# Patient Record
Sex: Female | Born: 1970
Health system: Southern US, Community
[De-identification: ages and names within clinical notes are randomized; demographics above are authoritative.]

## PROBLEM LIST (undated history)

## (undated) DIAGNOSIS — F0781 Postconcussional syndrome: Secondary | ICD-10-CM

## (undated) DIAGNOSIS — C181 Malignant neoplasm of appendix: Secondary | ICD-10-CM

## (undated) DIAGNOSIS — D509 Iron deficiency anemia, unspecified: Secondary | ICD-10-CM

## (undated) DIAGNOSIS — E559 Vitamin D deficiency, unspecified: Secondary | ICD-10-CM

## (undated) DIAGNOSIS — R05 Cough: Secondary | ICD-10-CM

## (undated) DIAGNOSIS — R51 Headache: Secondary | ICD-10-CM

## (undated) DIAGNOSIS — N200 Calculus of kidney: Secondary | ICD-10-CM

## (undated) DIAGNOSIS — R011 Cardiac murmur, unspecified: Secondary | ICD-10-CM

## (undated) DIAGNOSIS — Z9889 Other specified postprocedural states: Secondary | ICD-10-CM

## (undated) DIAGNOSIS — N809 Endometriosis, unspecified: Secondary | ICD-10-CM

## (undated) DIAGNOSIS — K219 Gastro-esophageal reflux disease without esophagitis: Secondary | ICD-10-CM

## (undated) DIAGNOSIS — G43909 Migraine, unspecified, not intractable, without status migrainosus: Secondary | ICD-10-CM

## (undated) DIAGNOSIS — R519 Headache, unspecified: Secondary | ICD-10-CM

## (undated) DIAGNOSIS — F329 Major depressive disorder, single episode, unspecified: Secondary | ICD-10-CM

## (undated) DIAGNOSIS — T4145XA Adverse effect of unspecified anesthetic, initial encounter: Secondary | ICD-10-CM

## (undated) DIAGNOSIS — R059 Cough, unspecified: Secondary | ICD-10-CM

## (undated) DIAGNOSIS — F419 Anxiety disorder, unspecified: Secondary | ICD-10-CM

## (undated) DIAGNOSIS — D649 Anemia, unspecified: Secondary | ICD-10-CM

## (undated) DIAGNOSIS — F32A Depression, unspecified: Secondary | ICD-10-CM

## (undated) DIAGNOSIS — E039 Hypothyroidism, unspecified: Secondary | ICD-10-CM

## (undated) DIAGNOSIS — T8859XA Other complications of anesthesia, initial encounter: Secondary | ICD-10-CM

## (undated) DIAGNOSIS — E66813 Obesity, class 3: Secondary | ICD-10-CM

## (undated) HISTORY — DX: Migraine, unspecified, not intractable, without status migrainosus: G43.909

## (undated) HISTORY — DX: Other specified postprocedural states: Z98.890

## (undated) HISTORY — DX: Cough: R05

## (undated) HISTORY — PX: KNEE SURGERY: SHX244

## (undated) HISTORY — DX: Anemia, unspecified: D64.9

## (undated) HISTORY — DX: Postconcussional syndrome: F07.81

## (undated) HISTORY — DX: Cardiac murmur, unspecified: R01.1

## (undated) HISTORY — DX: Vitamin D deficiency, unspecified: E55.9

## (undated) HISTORY — DX: Obesity, class 3: E66.813

## (undated) HISTORY — DX: Malignant neoplasm of appendix: C18.1

## (undated) HISTORY — PX: ABDOMINAL HYSTERECTOMY: SHX81

## (undated) HISTORY — DX: Cough, unspecified: R05.9

## (undated) HISTORY — DX: Iron deficiency anemia, unspecified: D50.9

## (undated) HISTORY — DX: Morbid (severe) obesity due to excess calories: E66.01

## (undated) HISTORY — DX: Endometriosis, unspecified: N80.9

---

## 1997-05-16 ENCOUNTER — Encounter (HOSPITAL_COMMUNITY): Admission: RE | Admit: 1997-05-16 | Discharge: 1997-06-27 | Payer: Self-pay | Admitting: *Deleted

## 1999-03-29 ENCOUNTER — Encounter: Admission: RE | Admit: 1999-03-29 | Discharge: 1999-03-29 | Payer: Self-pay | Admitting: Otolaryngology

## 1999-03-29 ENCOUNTER — Encounter: Payer: Self-pay | Admitting: Otolaryngology

## 1999-04-24 ENCOUNTER — Other Ambulatory Visit: Admission: RE | Admit: 1999-04-24 | Discharge: 1999-04-24 | Payer: Self-pay | Admitting: Obstetrics and Gynecology

## 2000-04-29 ENCOUNTER — Other Ambulatory Visit: Admission: RE | Admit: 2000-04-29 | Discharge: 2000-04-29 | Payer: Self-pay | Admitting: Obstetrics and Gynecology

## 2003-01-12 ENCOUNTER — Other Ambulatory Visit: Admission: RE | Admit: 2003-01-12 | Discharge: 2003-01-12 | Payer: Self-pay | Admitting: Obstetrics and Gynecology

## 2005-04-15 HISTORY — PX: COLPOSCOPY: SHX161

## 2006-12-23 ENCOUNTER — Inpatient Hospital Stay (HOSPITAL_COMMUNITY): Admission: AD | Admit: 2006-12-23 | Discharge: 2006-12-23 | Payer: Self-pay | Admitting: Obstetrics and Gynecology

## 2007-01-28 ENCOUNTER — Observation Stay (HOSPITAL_COMMUNITY): Admission: AD | Admit: 2007-01-28 | Discharge: 2007-01-29 | Payer: Self-pay | Admitting: Obstetrics and Gynecology

## 2007-02-15 ENCOUNTER — Inpatient Hospital Stay (HOSPITAL_COMMUNITY): Admission: AD | Admit: 2007-02-15 | Discharge: 2007-02-16 | Payer: Self-pay | Admitting: Obstetrics and Gynecology

## 2007-03-20 ENCOUNTER — Inpatient Hospital Stay (HOSPITAL_COMMUNITY): Admission: AD | Admit: 2007-03-20 | Discharge: 2007-03-21 | Payer: Self-pay | Admitting: Obstetrics and Gynecology

## 2007-03-31 ENCOUNTER — Inpatient Hospital Stay (HOSPITAL_COMMUNITY): Admission: AD | Admit: 2007-03-31 | Discharge: 2007-03-31 | Payer: Self-pay | Admitting: Obstetrics and Gynecology

## 2007-04-20 ENCOUNTER — Inpatient Hospital Stay (HOSPITAL_COMMUNITY): Admission: AD | Admit: 2007-04-20 | Discharge: 2007-04-20 | Payer: Self-pay | Admitting: Obstetrics and Gynecology

## 2007-04-30 ENCOUNTER — Inpatient Hospital Stay (HOSPITAL_COMMUNITY): Admission: AD | Admit: 2007-04-30 | Discharge: 2007-05-03 | Payer: Self-pay | Admitting: Obstetrics and Gynecology

## 2007-05-06 ENCOUNTER — Inpatient Hospital Stay (HOSPITAL_COMMUNITY): Admission: AD | Admit: 2007-05-06 | Discharge: 2007-05-06 | Payer: Self-pay | Admitting: Obstetrics and Gynecology

## 2008-04-15 DIAGNOSIS — Z9889 Other specified postprocedural states: Secondary | ICD-10-CM

## 2008-04-15 HISTORY — DX: Other specified postprocedural states: Z98.890

## 2008-04-15 HISTORY — PX: NASAL SINUS SURGERY: SHX719

## 2008-05-07 ENCOUNTER — Ambulatory Visit: Payer: Self-pay | Admitting: Family Medicine

## 2008-05-07 DIAGNOSIS — S83419A Sprain of medial collateral ligament of unspecified knee, initial encounter: Secondary | ICD-10-CM | POA: Insufficient documentation

## 2008-05-07 DIAGNOSIS — S93409A Sprain of unspecified ligament of unspecified ankle, initial encounter: Secondary | ICD-10-CM | POA: Insufficient documentation

## 2010-04-25 DIAGNOSIS — N809 Endometriosis, unspecified: Secondary | ICD-10-CM | POA: Insufficient documentation

## 2010-05-11 DIAGNOSIS — F418 Other specified anxiety disorders: Secondary | ICD-10-CM | POA: Insufficient documentation

## 2010-06-04 DIAGNOSIS — M797 Fibromyalgia: Secondary | ICD-10-CM | POA: Insufficient documentation

## 2010-12-15 ENCOUNTER — Encounter: Payer: Self-pay | Admitting: Family Medicine

## 2010-12-15 ENCOUNTER — Inpatient Hospital Stay (INDEPENDENT_AMBULATORY_CARE_PROVIDER_SITE_OTHER)
Admission: RE | Admit: 2010-12-15 | Discharge: 2010-12-15 | Disposition: A | Discharge status: Home / Self Care | Payer: 59 | Source: Ambulatory Visit | Attending: Family Medicine | Admitting: Family Medicine

## 2010-12-15 DIAGNOSIS — J069 Acute upper respiratory infection, unspecified: Secondary | ICD-10-CM

## 2010-12-15 DIAGNOSIS — H6980 Other specified disorders of Eustachian tube, unspecified ear: Secondary | ICD-10-CM

## 2010-12-15 LAB — CONVERTED CEMR LAB: Rapid Strep: NEGATIVE

## 2011-01-03 LAB — URINALYSIS, ROUTINE W REFLEX MICROSCOPIC
Bilirubin Urine: NEGATIVE
Glucose, UA: NEGATIVE
Ketones, ur: NEGATIVE
Leukocytes, UA: NEGATIVE
Nitrite: NEGATIVE
Protein, ur: NEGATIVE
Specific Gravity, Urine: 1.01
Urobilinogen, UA: 0.2
pH: 7

## 2011-01-03 LAB — CBC
HCT: 26.1 — ABNORMAL LOW
HCT: 27.4 — ABNORMAL LOW
Hemoglobin: 9.1 — ABNORMAL LOW
MCV: 87.6
Platelets: 283
RBC: 2.98 — ABNORMAL LOW
RBC: 3.12 — ABNORMAL LOW
WBC: 8.1

## 2011-01-03 LAB — URINE MICROSCOPIC-ADD ON

## 2011-01-03 LAB — RPR: RPR Ser Ql: NONREACTIVE

## 2011-01-04 ENCOUNTER — Encounter: Payer: Self-pay | Admitting: Emergency Medicine

## 2011-01-04 ENCOUNTER — Inpatient Hospital Stay (INDEPENDENT_AMBULATORY_CARE_PROVIDER_SITE_OTHER)
Admission: RE | Admit: 2011-01-04 | Discharge: 2011-01-04 | Disposition: A | Discharge status: Home / Self Care | Payer: 59 | Source: Ambulatory Visit | Attending: Emergency Medicine | Admitting: Emergency Medicine

## 2011-01-04 DIAGNOSIS — R05 Cough: Secondary | ICD-10-CM | POA: Insufficient documentation

## 2011-01-04 DIAGNOSIS — R059 Cough, unspecified: Secondary | ICD-10-CM

## 2011-01-04 DIAGNOSIS — J029 Acute pharyngitis, unspecified: Secondary | ICD-10-CM | POA: Insufficient documentation

## 2011-01-04 DIAGNOSIS — J069 Acute upper respiratory infection, unspecified: Secondary | ICD-10-CM

## 2011-01-04 LAB — CONVERTED CEMR LAB: Rapid Strep: NEGATIVE

## 2011-01-11 DIAGNOSIS — J069 Acute upper respiratory infection, unspecified: Secondary | ICD-10-CM

## 2011-01-12 ENCOUNTER — Encounter: Payer: Self-pay | Admitting: Family Medicine

## 2011-01-12 ENCOUNTER — Inpatient Hospital Stay (INDEPENDENT_AMBULATORY_CARE_PROVIDER_SITE_OTHER)
Admission: RE | Admit: 2011-01-12 | Discharge: 2011-01-12 | Disposition: A | Discharge status: Home / Self Care | Payer: 59 | Source: Ambulatory Visit | Attending: Family Medicine | Admitting: Family Medicine

## 2011-01-12 ENCOUNTER — Ambulatory Visit
Admission: RE | Admit: 2011-01-12 | Discharge: 2011-01-12 | Disposition: A | Discharge status: Home / Self Care | Payer: 59 | Source: Ambulatory Visit | Attending: Family Medicine | Admitting: Family Medicine

## 2011-01-12 ENCOUNTER — Other Ambulatory Visit: Payer: Self-pay | Admitting: Family Medicine

## 2011-01-12 DIAGNOSIS — R059 Cough, unspecified: Secondary | ICD-10-CM

## 2011-01-12 DIAGNOSIS — R05 Cough: Secondary | ICD-10-CM

## 2011-01-12 DIAGNOSIS — J069 Acute upper respiratory infection, unspecified: Secondary | ICD-10-CM

## 2011-01-18 LAB — URINALYSIS, ROUTINE W REFLEX MICROSCOPIC
Bilirubin Urine: NEGATIVE
Glucose, UA: NEGATIVE
Hgb urine dipstick: NEGATIVE
Specific Gravity, Urine: 1.02

## 2011-01-18 LAB — FETAL FIBRONECTIN: Fetal Fibronectin: NEGATIVE

## 2011-01-22 LAB — URINALYSIS, ROUTINE W REFLEX MICROSCOPIC
Glucose, UA: NEGATIVE
Nitrite: NEGATIVE
Specific Gravity, Urine: 1.02
pH: 6

## 2011-01-22 LAB — URINE MICROSCOPIC-ADD ON

## 2011-01-22 LAB — URINE CULTURE

## 2011-01-23 LAB — CBC
HCT: 40.4
Platelets: 345
RDW: 13.4

## 2011-01-23 LAB — URINALYSIS, ROUTINE W REFLEX MICROSCOPIC
Glucose, UA: NEGATIVE
Leukocytes, UA: NEGATIVE
Protein, ur: NEGATIVE
Specific Gravity, Urine: 1.015
pH: 5.5

## 2011-01-23 LAB — DIFFERENTIAL
Basophils Absolute: 0
Eosinophils Absolute: 0.6
Eosinophils Relative: 5
Lymphocytes Relative: 19

## 2011-01-23 LAB — URINE MICROSCOPIC-ADD ON

## 2011-01-25 LAB — URINALYSIS, ROUTINE W REFLEX MICROSCOPIC
Ketones, ur: NEGATIVE
Nitrite: NEGATIVE
Protein, ur: NEGATIVE

## 2011-02-04 ENCOUNTER — Emergency Department (INDEPENDENT_AMBULATORY_CARE_PROVIDER_SITE_OTHER): Payer: 59

## 2011-02-04 ENCOUNTER — Encounter: Payer: Self-pay | Admitting: Student

## 2011-02-04 ENCOUNTER — Ambulatory Visit (HOSPITAL_COMMUNITY): Payer: 59 | Admitting: Psychology

## 2011-02-04 ENCOUNTER — Emergency Department (HOSPITAL_BASED_OUTPATIENT_CLINIC_OR_DEPARTMENT_OTHER)
Admission: EM | Admit: 2011-02-04 | Discharge: 2011-02-04 | Disposition: A | Discharge status: Home / Self Care | Payer: 59 | Attending: Emergency Medicine | Admitting: Emergency Medicine

## 2011-02-04 DIAGNOSIS — K219 Gastro-esophageal reflux disease without esophagitis: Secondary | ICD-10-CM | POA: Insufficient documentation

## 2011-02-04 DIAGNOSIS — R109 Unspecified abdominal pain: Secondary | ICD-10-CM

## 2011-02-04 DIAGNOSIS — K7689 Other specified diseases of liver: Secondary | ICD-10-CM

## 2011-02-04 DIAGNOSIS — E079 Disorder of thyroid, unspecified: Secondary | ICD-10-CM | POA: Insufficient documentation

## 2011-02-04 DIAGNOSIS — J45909 Unspecified asthma, uncomplicated: Secondary | ICD-10-CM | POA: Insufficient documentation

## 2011-02-04 DIAGNOSIS — F341 Dysthymic disorder: Secondary | ICD-10-CM | POA: Insufficient documentation

## 2011-02-04 DIAGNOSIS — N200 Calculus of kidney: Secondary | ICD-10-CM

## 2011-02-04 DIAGNOSIS — N9489 Other specified conditions associated with female genital organs and menstrual cycle: Secondary | ICD-10-CM | POA: Insufficient documentation

## 2011-02-04 HISTORY — DX: Hypothyroidism, unspecified: E03.9

## 2011-02-04 HISTORY — DX: Depression, unspecified: F32.A

## 2011-02-04 HISTORY — DX: Major depressive disorder, single episode, unspecified: F32.9

## 2011-02-04 HISTORY — DX: Gastro-esophageal reflux disease without esophagitis: K21.9

## 2011-02-04 HISTORY — DX: Calculus of kidney: N20.0

## 2011-02-04 HISTORY — DX: Anxiety disorder, unspecified: F41.9

## 2011-02-04 LAB — DIFFERENTIAL
Basophils Absolute: 0 10*3/uL (ref 0.0–0.1)
Basophils Relative: 1 % (ref 0–1)
Neutro Abs: 3.6 10*3/uL (ref 1.7–7.7)
Neutrophils Relative %: 54 % (ref 43–77)

## 2011-02-04 LAB — BASIC METABOLIC PANEL
Chloride: 102 mEq/L (ref 96–112)
Creatinine, Ser: 0.6 mg/dL (ref 0.50–1.10)
GFR calc Af Amer: >90 mL/min (ref >90)
Potassium: 3.6 mEq/L (ref 3.5–5.1)

## 2011-02-04 LAB — URINE MICROSCOPIC-ADD ON

## 2011-02-04 LAB — URINALYSIS, ROUTINE W REFLEX MICROSCOPIC
Ketones, ur: NEGATIVE mg/dL
Nitrite: NEGATIVE
Specific Gravity, Urine: 1.019 (ref 1.005–1.030)
pH: 6 (ref 5.0–8.0)

## 2011-02-04 LAB — CBC
MCHC: 33.9 g/dL (ref 30.0–36.0)
RDW: 13.7 % (ref 11.5–15.5)

## 2011-02-04 MED ORDER — KETOROLAC TROMETHAMINE 30 MG/ML IJ SOLN
30.0000 mg | Freq: Once | INTRAMUSCULAR | Status: AC
  Administered 2011-02-04: 10:00:00 via INTRAVENOUS

## 2011-02-04 MED ORDER — KETOROLAC TROMETHAMINE 30 MG/ML IJ SOLN
INTRAMUSCULAR | Status: AC
  Filled 2011-02-04: qty 1

## 2011-02-04 MED ORDER — SODIUM CHLORIDE 0.9 % IV SOLN: Freq: Once | INTRAVENOUS | Status: DC

## 2011-02-04 MED ORDER — PROMETHAZINE HCL 25 MG/ML IJ SOLN
INTRAMUSCULAR | Status: AC
  Administered 2011-02-04: 12.5 mg via INTRAVENOUS
  Filled 2011-02-04: qty 1

## 2011-02-04 MED ORDER — HYDROCODONE-ACETAMINOPHEN 5-500 MG PO TABS: 1.0000 | ORAL_TABLET | Freq: Four times a day (QID) | ORAL | Status: AC | PRN

## 2011-02-04 MED ORDER — MORPHINE SULFATE 4 MG/ML IJ SOLN
4.0000 mg | Freq: Once | INTRAMUSCULAR | Status: AC
  Administered 2011-02-04: 4 mg via INTRAVENOUS
  Filled 2011-02-04: qty 1

## 2011-02-04 MED ORDER — SODIUM CHLORIDE 0.9 % IV SOLN
999.0000 mL | Freq: Once | INTRAVENOUS | Status: AC
  Administered 2011-02-04: 999 mL via INTRAVENOUS

## 2011-02-04 MED ORDER — ONDANSETRON HCL 4 MG/2ML IJ SOLN
INTRAMUSCULAR | Status: AC
  Filled 2011-02-04: qty 2

## 2011-02-04 MED ORDER — PROMETHAZINE HCL 25 MG/ML IJ SOLN
12.5000 mg | Freq: Once | INTRAMUSCULAR | Status: AC
  Administered 2011-02-04: 12.5 mg via INTRAVENOUS

## 2011-02-04 MED ORDER — ONDANSETRON HCL 4 MG/2ML IJ SOLN
4.0000 mg | Freq: Once | INTRAMUSCULAR | Status: AC
  Administered 2011-02-04: 10:00:00 via INTRAVENOUS

## 2011-02-04 NOTE — ED Notes (Signed)
Family at bedside. MD at bedside.

## 2011-02-04 NOTE — ED Provider Notes (Signed)
History     CSN: [id number] Arrival date & time: 02/04/2011  9:02 AM   First MD Initiated Contact with Patient 02/04/11 0945      Chief Complaint  Patient presents with  . Flank Pain  . Pelvic Pain    (Consider location/radiation/quality/duration/timing/severity/associated sxs/prior treatment) HPI Comments: Feels like prior renal calculi.    Patient is a 40 y.o. female presenting with flank pain and pelvic pain. The history is provided by the patient.  Flank Pain This is a new problem. The current episode started 3 to 5 hours ago. The problem occurs constantly. The problem has been gradually worsening. Associated symptoms include abdominal pain. Pertinent negatives include no chest pain. The symptoms are aggravated by bending and twisting. The symptoms are relieved by nothing. She has tried nothing for the symptoms.  Pelvic Pain Associated symptoms include abdominal pain. Pertinent negatives include no chest pain.    Past Medical History  Diagnosis Date  . Kidney stones   . Asthma   . Depression   . Anxiety   . GERD (gastroesophageal reflux disease)   . Hypothyroidism     Past Surgical History  Procedure Date  . Knee surgery     History reviewed. No pertinent family history.  History  Substance Use Topics  . Smoking status: Never Smoker   . Smokeless tobacco: Not on file  . Alcohol Use: No    OB History    Grav Para Term Preterm Abortions TAB SAB Ect Mult Living                  Review of Systems  Constitutional: Negative for fever and chills.  HENT: Negative for neck pain and neck stiffness.   Cardiovascular: Negative for chest pain.  Gastrointestinal: Positive for abdominal pain.  Genitourinary: Positive for flank pain and pelvic pain.  All other systems reviewed and are negative.    Allergies  Clarithromycin and Moxifloxacin  Home Medications   Current Outpatient Rx  Name Route Sig Dispense Refill  . BUDESONIDE-FORMOTEROL FUMARATE 160-4.5  MCG/ACT IN AERO Inhalation Inhale 2 puffs into the lungs 2 (two) times daily.     Marland Kitchen CLONAZEPAM 1 MG PO TABS Oral Take 0.5 mg by mouth 2 (two) times daily as needed. For anxiety    . ESOMEPRAZOLE MAGNESIUM 40 MG PO CPDR Oral Take 40 mg by mouth daily.      Marland Kitchen FLINTSTONES COMPLETE 60 MG PO CHEW Oral Chew 2 tablets by mouth daily.      . IBUPROFEN 200 MG PO TABS Oral Take 400 mg by mouth every 6 (six) hours as needed. Headache and pain     . LEVOTHYROXINE SODIUM 75 MCG PO TABS Oral Take 75 mcg by mouth daily.     . SERTRALINE HCL 100 MG PO TABS Oral Take 100 mg by mouth daily.       BP 153/95  Pulse 86  Temp(Src) 97.7 F (36.5 C) (Oral)  Resp 20  Wt 248 lb (112.492 kg)  SpO2 100%  LMP 01/28/2011  Physical Exam  Nursing note and vitals reviewed. Constitutional: She is oriented to person, place, and time. She appears well-developed and well-nourished. No distress.  HENT:  Head: Normocephalic and atraumatic.  Neck: Normal range of motion. Neck supple.  Cardiovascular: Normal rate and regular rhythm.  Exam reveals no gallop and no friction rub.   No murmur heard. Pulmonary/Chest: Effort normal and breath sounds normal. No respiratory distress.  Abdominal: Soft. She exhibits no distension. There is  no tenderness.  Genitourinary:       Mild cva ttp on left.  Musculoskeletal: Normal range of motion.  Neurological: She is alert and oriented to person, place, and time.  Skin: Skin is warm and dry. She is not diaphoretic.    ED Course  Procedures (including critical care time)   Labs Reviewed  CBC  DIFFERENTIAL  BASIC METABOLIC PANEL  URINALYSIS, ROUTINE W REFLEX MICROSCOPIC  PREGNANCY, URINE   No results found.   No diagnosis found.    MDM  Ct okay, labs okay.  Unsure if pain is from passed stone or some sort of musculoskeletal etiology.  Does not appear emergent at this point.  Will treat with pain meds, time, f/u prn.        Geoffery Lyons, MD 02/04/11 1201

## 2011-02-04 NOTE — ED Notes (Signed)
Family at bedside. 

## 2011-02-04 NOTE — ED Notes (Signed)
Pt in with c/o sudden onset lower back/flank pain on right and left side with left > than right. Reports N V with onset and pain radiating from flank area to lower pelvic region. Denies dysuria or other urinary s/sx. Pt reports prior hx of kidney stones in past and reports pain is similar in nature and form from last kidney stone.

## 2011-02-08 ENCOUNTER — Emergency Department (HOSPITAL_BASED_OUTPATIENT_CLINIC_OR_DEPARTMENT_OTHER)
Admission: EM | Admit: 2011-02-08 | Discharge: 2011-02-08 | Disposition: A | Discharge status: Home / Self Care | Payer: 59 | Attending: Emergency Medicine | Admitting: Emergency Medicine

## 2011-02-08 ENCOUNTER — Encounter (HOSPITAL_BASED_OUTPATIENT_CLINIC_OR_DEPARTMENT_OTHER): Payer: Self-pay | Admitting: Family Medicine

## 2011-02-08 DIAGNOSIS — M549 Dorsalgia, unspecified: Secondary | ICD-10-CM | POA: Insufficient documentation

## 2011-02-08 DIAGNOSIS — A499 Bacterial infection, unspecified: Secondary | ICD-10-CM | POA: Insufficient documentation

## 2011-02-08 DIAGNOSIS — B9689 Other specified bacterial agents as the cause of diseases classified elsewhere: Secondary | ICD-10-CM | POA: Insufficient documentation

## 2011-02-08 DIAGNOSIS — K219 Gastro-esophageal reflux disease without esophagitis: Secondary | ICD-10-CM | POA: Insufficient documentation

## 2011-02-08 DIAGNOSIS — F341 Dysthymic disorder: Secondary | ICD-10-CM | POA: Insufficient documentation

## 2011-02-08 DIAGNOSIS — N760 Acute vaginitis: Secondary | ICD-10-CM | POA: Insufficient documentation

## 2011-02-08 DIAGNOSIS — E039 Hypothyroidism, unspecified: Secondary | ICD-10-CM | POA: Insufficient documentation

## 2011-02-08 DIAGNOSIS — J45909 Unspecified asthma, uncomplicated: Secondary | ICD-10-CM | POA: Insufficient documentation

## 2011-02-08 LAB — URINALYSIS, ROUTINE W REFLEX MICROSCOPIC
Glucose, UA: NEGATIVE mg/dL
Hgb urine dipstick: NEGATIVE
Protein, ur: NEGATIVE mg/dL
pH: 6.5 (ref 5.0–8.0)

## 2011-02-08 LAB — WET PREP, GENITAL: Yeast Wet Prep HPF POC: NONE SEEN

## 2011-02-08 LAB — URINE MICROSCOPIC-ADD ON

## 2011-02-08 MED ORDER — ONDANSETRON 4 MG PO TBDP
ORAL_TABLET | ORAL | Status: AC
  Administered 2011-02-08: 4 mg
  Filled 2011-02-08: qty 1

## 2011-02-08 MED ORDER — METRONIDAZOLE 500 MG PO TABS: 500.0000 mg | ORAL_TABLET | Freq: Two times a day (BID) | ORAL | Status: AC

## 2011-02-08 MED ORDER — HYDROCODONE-ACETAMINOPHEN 5-325 MG PO TABS: 2.0000 | ORAL_TABLET | ORAL | Status: AC | PRN

## 2011-02-08 NOTE — ED Provider Notes (Signed)
History     CSN: [id number] Arrival date & time: 02/08/2011  5:46 PM   First MD Initiated Contact with Patient 02/08/11 1809      Chief Complaint  Patient presents with  . Pelvic Pain    (Consider location/radiation/quality/duration/timing/severity/associated sxs/prior treatment) Patient is a 41 y.o. female presenting with flank pain. The history is provided by the patient. No language interpreter was used.  Flank Pain This is a new problem. The current episode started in the past 7 days. The problem occurs constantly. The problem has been unchanged. Associated symptoms include abdominal pain. The symptoms are aggravated by nothing. She has tried nothing for the symptoms. The treatment provided moderate relief.  Pt complains of back pain and lower abdominal pain.  Pt was here 5 days ago and had a ct scan.  Pt thought she had a kidney stone but scan was normal.  Pt has had a history of endometrosis.  Pt sees Dr. Arelia Sneddon.  Pt complains of continued nausea.  Past Medical History  Diagnosis Date  . Kidney stones   . Asthma   . Depression   . Anxiety   . GERD (gastroesophageal reflux disease)   . Hypothyroidism   . Endometriosis     Past Surgical History  Procedure Date  . Knee surgery     No family history on file.  History  Substance Use Topics  . Smoking status: Never Smoker   . Smokeless tobacco: Not on file  . Alcohol Use: No    OB History    Grav Para Term Preterm Abortions TAB SAB Ect Mult Living                  Review of Systems  Gastrointestinal: Positive for abdominal pain.  Genitourinary: Positive for flank pain.  All other systems reviewed and are negative.    Allergies  Clarithromycin and Moxifloxacin  Home Medications   Current Outpatient Rx  Name Route Sig Dispense Refill  . BUDESONIDE-FORMOTEROL FUMARATE 160-4.5 MCG/ACT IN AERO Inhalation Inhale 2 puffs into the lungs 2 (two) times daily.     Marland Kitchen CLONAZEPAM 0.5 MG PO TABS Oral Take 0.5 mg by  mouth 2 (two) times daily as needed. For anxiety and ptsd     . FLINTSTONES COMPLETE 60 MG PO CHEW Oral Chew 2 tablets by mouth daily.      Marland Kitchen HYDROCODONE-ACETAMINOPHEN 5-500 MG PO TABS Oral Take 1-2 tablets by mouth every 6 (six) hours as needed for pain. 12 tablet 0  . IBUPROFEN 200 MG PO TABS Oral Take 400 mg by mouth every 6 (six) hours as needed. Headache and pain     . LEVOTHYROXINE SODIUM 75 MCG PO TABS Oral Take 75 mcg by mouth daily.     . SERTRALINE HCL 100 MG PO TABS Oral Take 100 mg by mouth daily.     Marland Kitchen CLONAZEPAM 1 MG PO TABS Oral Take 0.5 mg by mouth 2 (two) times daily as needed. For anxiety    . ESOMEPRAZOLE MAGNESIUM 40 MG PO CPDR Oral Take 40 mg by mouth daily.        BP 138/93  Pulse 71  Temp(Src) 97.8 F (36.6 C) (Oral)  Resp 18  SpO2 99%  LMP 01/28/2011  Physical Exam  Nursing note and vitals reviewed. Constitutional: She is oriented to person, place, and time. She appears well-developed and well-nourished.  HENT:  Head: Normocephalic and atraumatic.  Eyes: Pupils are equal, round, and reactive to light.  Neck: Normal range  of motion.  Cardiovascular: Normal rate.   Pulmonary/Chest: Effort normal.  Abdominal: Soft.  Genitourinary: Uterus normal.  Neurological: She is alert and oriented to person, place, and time.  Skin: Skin is warm.  Psychiatric: She has a normal mood and affect.    ED Course  Procedures (including critical care time)   Labs Reviewed  WET PREP, GENITAL  GC/CHLAMYDIA PROBE AMP, GENITAL   No results found.   No diagnosis found.    MDM  I reviewed Ct scan ovaries were normal,  Appendix and gallbladder appeared normal.  I think pt is probably having pain from endometrosis.  Wet prep does show clue cells.  I will treat with flagyl.  I advised pt to see Dr. Erlinda Hong Comb next week for recheck.        Langston Masker, [place] 02/08/11 2146  Langston Masker, [place] 02/08/11 2148

## 2011-02-08 NOTE — ED Notes (Signed)
Pt c/o "pelvic pain since Sunday night". Pt sts she was seen for left flank pain here. Pt reports nausea remains. Pt denies dysuria.

## 2011-02-08 NOTE — ED Notes (Signed)
Pt given ice chips and encouraged to void.  Pt states unable to at this time

## 2011-02-09 NOTE — ED Provider Notes (Signed)
Medical screening examination/treatment/procedure(s) were performed by non-physician practitioner and as supervising physician I was immediately available for consultation/collaboration.   Nelia Shi, MD 02/09/11 0111

## 2011-02-13 ENCOUNTER — Ambulatory Visit (HOSPITAL_COMMUNITY): Payer: 59 | Admitting: Psychiatry

## 2011-02-17 ENCOUNTER — Emergency Department (INDEPENDENT_AMBULATORY_CARE_PROVIDER_SITE_OTHER)
Admission: EM | Admit: 2011-02-17 | Discharge: 2011-02-17 | Disposition: A | Discharge status: Home / Self Care | Payer: 59 | Source: Home / Self Care | Attending: Emergency Medicine | Admitting: Emergency Medicine

## 2011-02-17 DIAGNOSIS — J069 Acute upper respiratory infection, unspecified: Secondary | ICD-10-CM

## 2011-02-17 LAB — POCT RAPID STREP A (OFFICE): Rapid Strep A Screen: NEGATIVE

## 2011-02-17 MED ORDER — PROMETHAZINE-CODEINE 6.25-10 MG/5ML PO SYRP: 5.0000 mL | ORAL_SOLUTION | Freq: Four times a day (QID) | ORAL | Status: AC | PRN

## 2011-02-17 NOTE — ED Provider Notes (Signed)
History    40 Years Old complains of onset of cold symptoms for 5 days.  They have been using Zyrtec and Flonase which is helping a little bit.  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  She still has the Rx. + sore throat + cough No pleuritic pain No wheezing + nasal congestion + post-nasal drainage + sinus pain/pressure No chest congestion No itchy/red eyes No earache No hemoptysis No SOB No chills/sweats No fever No nausea No vomiting No abdominal pain No diarrhea No skin rashes + fatigue No myalgias + headache   CSN: [id number] Arrival date & time: 02/17/2011  2:26 PM   First MD Initiated Contact with Patient 02/17/11 1449      Chief Complaint  Patient presents with  . Cough  . Fever  . Generalized Body Aches    (Consider location/radiation/quality/duration/timing/severity/associated sxs/prior treatment) HPI  Past Medical History  Diagnosis Date  . Kidney stones   . Asthma   . Depression   . Anxiety   . GERD (gastroesophageal reflux disease)   . Hypothyroidism   . Endometriosis     Past Surgical History  Procedure Date  . Knee surgery     Family History  Problem Relation Age of Onset  . Cancer Father     lung    History  Substance Use Topics  . Smoking status: Never Smoker   . Smokeless tobacco: Not on file  . Alcohol Use: No    OB History    Grav Para Term Preterm Abortions TAB SAB Ect Mult Living                  Review of Systems  Allergies  Biaxin; Clarithromycin; and Moxifloxacin  Home Medications   Current Outpatient Rx  Name Route Sig Dispense Refill  . BUDESONIDE-FORMOTEROL FUMARATE 160-4.5 MCG/ACT IN AERO Inhalation Inhale 2 puffs into the lungs 2 (two) times daily.     Marland Kitchen CLONAZEPAM 0.5 MG PO TABS Oral Take 0.5 mg by mouth 2 (two) times daily as needed. For anxiety and ptsd     . CLONAZEPAM 1 MG PO TABS Oral Take 0.5 mg by mouth 2  (two) times daily as needed. For anxiety    . ESOMEPRAZOLE MAGNESIUM 40 MG PO CPDR Oral Take 40 mg by mouth daily.      Marland Kitchen FLINTSTONES COMPLETE 60 MG PO CHEW Oral Chew 2 tablets by mouth daily.      Marland Kitchen HYDROCODONE-ACETAMINOPHEN 5-325 MG PO TABS Oral Take 2 tablets by mouth every 4 (four) hours as needed for pain. 10 tablet 0  . IBUPROFEN 200 MG PO TABS Oral Take 400 mg by mouth every 6 (six) hours as needed. Headache and pain     . LEVOTHYROXINE SODIUM 75 MCG PO TABS Oral Take 75 mcg by mouth daily.     Marland Kitchen METRONIDAZOLE 500 MG PO TABS Oral Take 1 tablet (500 mg total) by mouth 2 (two) times daily. 14 tablet 0  . SERTRALINE HCL 100 MG PO TABS Oral Take 100 mg by mouth daily.       LMP 01/28/2011  Physical Exam  Nursing note and vitals reviewed. Constitutional: She is oriented to person, place, and time. She appears well-developed and well-nourished.  HENT:  Head: Normocephalic and atraumatic.  Right Ear: Tympanic membrane and external ear normal.  Left Ear: Tympanic membrane, external ear and ear canal normal.  Nose: Rhinorrhea  present.  Mouth/Throat: Mucous membranes are normal. Posterior oropharyngeal erythema present. No oropharyngeal exudate or posterior oropharyngeal edema.  Neck: Neck supple.  Cardiovascular: Normal rate and regular rhythm.   Pulmonary/Chest: Effort normal and breath sounds normal.  Neurological: She is alert and oriented to person, place, and time.  Skin: Skin is warm and dry.    ED Course  Procedures (including critical care time)   Labs Reviewed  POCT RAPID STREP A (OFFICE)   No results found.   No diagnosis found.    MDM        Lily Kocher, MD 02/17/11 [phone]

## 2011-02-27 ENCOUNTER — Ambulatory Visit (HOSPITAL_COMMUNITY): Payer: 59 | Admitting: Psychiatry

## 2011-03-09 ENCOUNTER — Emergency Department (HOSPITAL_COMMUNITY): Payer: 59

## 2011-03-09 ENCOUNTER — Encounter (HOSPITAL_COMMUNITY): Payer: Self-pay | Admitting: *Deleted

## 2011-03-09 ENCOUNTER — Emergency Department (HOSPITAL_COMMUNITY)
Admission: EM | Admit: 2011-03-09 | Discharge: 2011-03-09 | Disposition: A | Discharge status: Home / Self Care | Payer: 59 | Attending: Emergency Medicine | Admitting: Emergency Medicine

## 2011-03-09 DIAGNOSIS — R197 Diarrhea, unspecified: Secondary | ICD-10-CM | POA: Insufficient documentation

## 2011-03-09 DIAGNOSIS — F341 Dysthymic disorder: Secondary | ICD-10-CM | POA: Insufficient documentation

## 2011-03-09 DIAGNOSIS — R112 Nausea with vomiting, unspecified: Secondary | ICD-10-CM | POA: Insufficient documentation

## 2011-03-09 DIAGNOSIS — R1011 Right upper quadrant pain: Secondary | ICD-10-CM | POA: Insufficient documentation

## 2011-03-09 DIAGNOSIS — R6883 Chills (without fever): Secondary | ICD-10-CM | POA: Insufficient documentation

## 2011-03-09 DIAGNOSIS — E039 Hypothyroidism, unspecified: Secondary | ICD-10-CM | POA: Insufficient documentation

## 2011-03-09 DIAGNOSIS — K219 Gastro-esophageal reflux disease without esophagitis: Secondary | ICD-10-CM | POA: Insufficient documentation

## 2011-03-09 DIAGNOSIS — J45909 Unspecified asthma, uncomplicated: Secondary | ICD-10-CM | POA: Insufficient documentation

## 2011-03-09 LAB — CBC
MCH: 28.2 pg (ref 26.0–34.0)
MCHC: 33.7 g/dL (ref 30.0–36.0)
MCV: 83.8 fL (ref 78.0–100.0)
Platelets: 299 10*3/uL (ref 150–400)
RDW: 13.7 % (ref 11.5–15.5)

## 2011-03-09 LAB — COMPREHENSIVE METABOLIC PANEL
ALT: 29 U/L (ref 0–35)
AST: 28 U/L (ref 0–37)
Albumin: 4.3 g/dL (ref 3.5–5.2)
Calcium: 10.2 mg/dL (ref 8.4–10.5)
GFR calc Af Amer: >90 mL/min (ref >90)
Glucose, Bld: 94 mg/dL (ref 70–99)
Potassium: 4.1 mEq/L (ref 3.5–5.1)
Sodium: 136 mEq/L (ref 135–145)
Total Protein: 8.6 g/dL — ABNORMAL HIGH (ref 6.0–8.3)

## 2011-03-09 LAB — URINALYSIS, ROUTINE W REFLEX MICROSCOPIC
Hgb urine dipstick: NEGATIVE
Nitrite: NEGATIVE
Specific Gravity, Urine: 1.016 (ref 1.005–1.030)
Urobilinogen, UA: 0.2 mg/dL (ref 0.0–1.0)
pH: 6.5 (ref 5.0–8.0)

## 2011-03-09 LAB — DIFFERENTIAL
Basophils Absolute: 0.1 10*3/uL (ref 0.0–0.1)
Basophils Relative: 1 % (ref 0–1)
Eosinophils Absolute: 0.6 10*3/uL (ref 0.0–0.7)
Eosinophils Relative: 6 % — ABNORMAL HIGH (ref 0–5)
Neutrophils Relative %: 66 % (ref 43–77)

## 2011-03-09 LAB — URINE MICROSCOPIC-ADD ON

## 2011-03-09 LAB — PREGNANCY, URINE: Preg Test, Ur: NEGATIVE

## 2011-03-09 MED ORDER — DIPHENHYDRAMINE HCL 50 MG/ML IJ SOLN
25.0000 mg | Freq: Once | INTRAMUSCULAR | Status: AC
  Administered 2011-03-09: 25 mg via INTRAVENOUS

## 2011-03-09 MED ORDER — ONDANSETRON HCL 4 MG/2ML IJ SOLN
4.0000 mg | Freq: Once | INTRAMUSCULAR | Status: AC
  Administered 2011-03-09: 4 mg via INTRAVENOUS
  Filled 2011-03-09: qty 2

## 2011-03-09 MED ORDER — HYDROMORPHONE HCL PF 1 MG/ML IJ SOLN
1.0000 mg | Freq: Once | INTRAMUSCULAR | Status: AC
  Administered 2011-03-09: 1 mg via INTRAVENOUS
  Filled 2011-03-09: qty 1

## 2011-03-09 MED ORDER — ONDANSETRON 4 MG PO TBDP
8.0000 mg | ORAL_TABLET | Freq: Once | ORAL | Status: AC
  Administered 2011-03-09: 8 mg via ORAL
  Filled 2011-03-09: qty 2

## 2011-03-09 MED ORDER — DIPHENHYDRAMINE HCL 50 MG/ML IJ SOLN
INTRAMUSCULAR | Status: AC
  Filled 2011-03-09: qty 1

## 2011-03-09 MED ORDER — PROMETHAZINE HCL 25 MG/ML IJ SOLN
25.0000 mg | INTRAMUSCULAR | Status: AC
  Administered 2011-03-09: 25 mg via INTRAVENOUS
  Filled 2011-03-09 (×2): qty 1

## 2011-03-09 MED ORDER — SODIUM CHLORIDE 0.9 % IV BOLUS (SEPSIS)
1000.0000 mL | Freq: Once | INTRAVENOUS | Status: AC
  Administered 2011-03-09: 1000 mL via INTRAVENOUS

## 2011-03-09 MED ORDER — PROMETHAZINE HCL 25 MG PO TABS: 25.0000 mg | ORAL_TABLET | Freq: Four times a day (QID) | ORAL | Status: DC | PRN

## 2011-03-09 MED ORDER — HYDROCODONE-ACETAMINOPHEN 5-500 MG PO TABS: 1.0000 | ORAL_TABLET | Freq: Four times a day (QID) | ORAL | Status: AC | PRN

## 2011-03-09 NOTE — ED Notes (Signed)
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 (Denies: fever, bleeding, urinary or vaginal sx), has recently been worked up for endometriosis. Rates pain at this time as a 6/10.

## 2011-03-09 NOTE — ED Notes (Signed)
First time meeting patient. 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 Patient states she started having abdoninal pain (focus area right side). Patient has been experiencing nausea and vomiting since onset. Patient denies fever.

## 2011-03-09 NOTE — ED Notes (Signed)
IV team at bedside after multiple attempts

## 2011-03-09 NOTE — ED Provider Notes (Signed)
History     CSN: [id number] Arrival date & time: 03/09/2011  1:20 AM   First MD Initiated Contact with Patient 03/09/11 0324      Chief Complaint  Patient presents with  . Abdominal Pain    also nvd    (Consider location/radiation/quality/duration/timing/severity/associated sxs/prior treatment) Patient is a 40 y.o. female presenting with abdominal pain. The history is provided by the patient.  Abdominal Pain The primary symptoms of the illness include abdominal pain, nausea, vomiting and diarrhea. The primary symptoms of the illness do not include fever, shortness of breath or dysuria. The current episode started less than 1 hour ago. The onset of the illness was gradual. The problem has been gradually worsening.  Associated with: Onset while working tonight. The patient states that she believes she is currently not pregnant. The patient has had a change in bowel habit. Additional symptoms associated with the illness include chills. Symptoms associated with the illness do not include constipation, urgency, hematuria, frequency or back pain. Significant associated medical issues do not include diabetes, gallstones or diverticulitis.   at work and developed right-sided abdominal pain with associated nausea vomiting diarrhea. No blood in emesis or stools. Patient has been around multiple sick patients with specifically no known sick contacts with similar symptoms. Patient is being worked up for endometriosis by her OB/GYN. No fevers. No vaginal bleeding or discharge. Symptoms moderate.pain sharp in quality. No alleviating factors. No aggravating factors.   Past Medical History  Diagnosis Date  . Kidney stones   . Asthma   . Depression   . Anxiety   . GERD (gastroesophageal reflux disease)   . Hypothyroidism   . Endometriosis     Past Surgical History  Procedure Date  . Knee surgery     Family History  Problem Relation Age of Onset  . Cancer Father     lung  . Cancer Other      History  Substance Use Topics  . Smoking status: Never Smoker   . Smokeless tobacco: Not on file  . Alcohol Use: No    OB History    Grav Para Term Preterm Abortions TAB SAB Ect Mult Living                  Review of Systems  Constitutional: Positive for chills. Negative for fever.  HENT: Negative for neck pain and neck stiffness.   Eyes: Negative for pain.  Respiratory: Negative for shortness of breath.   Cardiovascular: Negative for chest pain.  Gastrointestinal: Positive for nausea, vomiting, abdominal pain and diarrhea. Negative for constipation.  Genitourinary: Negative for dysuria, urgency, frequency and hematuria.  Musculoskeletal: Negative for back pain.  Skin: Negative for rash.  Neurological: Negative for headaches.  All other systems reviewed and are negative.    Allergies  Biaxin; Clarithromycin; and Moxifloxacin  Home Medications   Current Outpatient Rx  Name Route Sig Dispense Refill  . BUDESONIDE-FORMOTEROL FUMARATE 160-4.5 MCG/ACT IN AERO Inhalation Inhale 2 puffs into the lungs 2 (two) times daily.     Marland Kitchen CLONAZEPAM 0.5 MG PO TABS Oral Take 0.5 mg by mouth 2 (two) times daily as needed. For anxiety and ptsd     . FLINTSTONES COMPLETE 60 MG PO CHEW Oral Chew 2 tablets by mouth daily.      . IBUPROFEN 200 MG PO TABS Oral Take 400 mg by mouth every 6 (six) hours as needed. Headache and pain     . LEVOTHYROXINE SODIUM 75 MCG PO TABS Oral Take  75 mcg by mouth daily.     . SERTRALINE HCL 100 MG PO TABS Oral Take 100 mg by mouth daily.     Marland Kitchen ESOMEPRAZOLE MAGNESIUM 40 MG PO CPDR Oral Take 40 mg by mouth daily.        BP 113/75  Pulse 88  Temp(Src) 98.4 F (36.9 C) (Oral)  Resp 18  SpO2 97%  LMP 02/27/2011  Physical Exam  Constitutional: She is oriented to person, place, and time. She appears well-developed and well-nourished.  HENT:  Head: Normocephalic and atraumatic.  Eyes: Conjunctivae and EOM are normal. Pupils are equal, round, and reactive  to light.  Neck: Trachea normal. Neck supple. No thyromegaly present.  Cardiovascular: Normal rate, regular rhythm, S1 normal, S2 normal and normal pulses.     No systolic murmur is present   No diastolic murmur is present  Pulses:      Radial pulses are 2+ on the right side, and 2+ on the left side.  Pulmonary/Chest: Effort normal and breath sounds normal. She has no wheezes. She has no rhonchi. She has no rales. She exhibits no tenderness.  Abdominal: Soft. Normal appearance and bowel sounds are normal. There is no CVA tenderness and negative Murphy's sign.       Tender To palpation right upper quadrant negative Murphy's sign. No peritonitis.   Musculoskeletal:       BLE:s Calves nontender, no cords or erythema, negative Homans sign  Neurological: She is alert and oriented to person, place, and time. She has normal strength. No cranial nerve deficit or sensory deficit. GCS eye subscore is 4. GCS verbal subscore is 5. GCS motor subscore is 6.  Skin: Skin is warm and dry. No rash noted. She is not diaphoretic.  Psychiatric: Her speech is normal.       Cooperative and appropriate    ED Course  Procedures (including critical care time)  Results for orders placed during the hospital encounter of 03/09/11  CBC      Component Value Range   WBC 10.4  4.0 - 10.5 (K/uL)   RBC 4.39  3.87 - 5.11 (MIL/uL)   Hemoglobin 12.4  12.0 - 15.0 (g/dL)   HCT 47.8  29.5 - 62.1 (%)   MCV 83.8  78.0 - 100.0 (fL)   MCH 28.2  26.0 - 34.0 (pg)   MCHC 33.7  30.0 - 36.0 (g/dL)   RDW 30.8  65.7 - 84.6 (%)   Platelets 299  150 - 400 (K/uL)  DIFFERENTIAL      Component Value Range   Neutrophils Relative 66  43 - 77 (%)   Neutro Abs 6.8  1.7 - 7.7 (K/uL)   Lymphocytes Relative 22  12 - 46 (%)   Lymphs Abs 2.3  0.7 - 4.0 (K/uL)   Monocytes Relative 6  3 - 12 (%)   Monocytes Absolute 0.6  0.1 - 1.0 (K/uL)   Eosinophils Relative 6 (*) 0 - 5 (%)   Eosinophils Absolute 0.6  0.0 - 0.7 (K/uL)   Basophils  Relative 1  0 - 1 (%)   Basophils Absolute 0.1  0.0 - 0.1 (K/uL)  COMPREHENSIVE METABOLIC PANEL      Component Value Range   Sodium 136  135 - 145 (mEq/L)   Potassium 4.1  3.5 - 5.1 (mEq/L)   Chloride 98  96 - 112 (mEq/L)   CO2 25  19 - 32 (mEq/L)   Glucose, Bld 94  70 - 99 (mg/dL)   BUN  11  6 - 23 (mg/dL)   Creatinine, Ser 2.13  0.50 - 1.10 (mg/dL)   Calcium 08.6  8.4 - 10.5 (mg/dL)   Total Protein 8.6 (*) 6.0 - 8.3 (g/dL)   Albumin 4.3  3.5 - 5.2 (g/dL)   AST 28  0 - 37 (U/L)   ALT 29  0 - 35 (U/L)   Alkaline Phosphatase 83  39 - 117 (U/L)   Total Bilirubin 0.4  0.3 - 1.2 (mg/dL)   GFR calc non Af Amer >90  >90 (mL/min)   GFR calc Af Amer >90  >90 (mL/min)  LIPASE, BLOOD      Component Value Range   Lipase 62 (*) 11 - 59 (U/L)  URINALYSIS, ROUTINE W REFLEX MICROSCOPIC      Component Value Range   Color, Urine YELLOW  YELLOW    Appearance CLEAR  CLEAR    Specific Gravity, Urine 1.016  1.005 - 1.030    pH 6.5  5.0 - 8.0    Glucose, UA NEGATIVE  NEGATIVE (mg/dL)   Hgb urine dipstick NEGATIVE  NEGATIVE    Bilirubin Urine NEGATIVE  NEGATIVE    Ketones, ur NEGATIVE  NEGATIVE (mg/dL)   Protein, ur NEGATIVE  NEGATIVE (mg/dL)   Urobilinogen, UA 0.2  0.0 - 1.0 (mg/dL)   Nitrite NEGATIVE  NEGATIVE    Leukocytes, UA TRACE (*) NEGATIVE   PREGNANCY, URINE      Component Value Range   Preg Test, Ur NEGATIVE    URINE MICROSCOPIC-ADD ON      Component Value Range   Squamous Epithelial / LPF FEW (*) RARE    WBC, UA 3-6  <3 (WBC/hpf)   RBC / HPF 0-2  <3 (RBC/hpf)   Bacteria, UA RARE  RARE    US Abdomen Complete  03/09/2011  *RADIOLOGY REPORT*  Clinical Data:  Right upper abdominal pain.  Bilateral nephrolithiasis.  COMPLETE ABDOMINAL ULTRASOUND  Comparison:  CT 02/04/2011 and earlier studies  Findings:  Gallbladder:  Physiologically distended without stones, wall thickening, or pericholecystic fluid.  Sonographer reports possible sonographic Murphy's sign.  Common bile duct:  Normal  in caliber, 4.34mm diameter.  Liver:  Homogeneous in echotexture without focal lesion or intrahepatic bile duct dilatation.  IVC:  Negative  Pancreas:  Negative  Spleen:  No focal lesion, craniocaudal 11.8cm in length.  Right Kidney:  No mass or hydronephrosis, 11.4cm in length.  Left Kidney:  No lesion or hydronephrosis, 11.0cm in length.  Abdominal aorta:  Negative  IMPRESSION:  1.  Possible sonographic Murphy's sign without other ultrasound evidence of cholecystitis or cholelithiasis.  Hepatobiliary scintigraphy may be useful for further evaluation if clinical concern persists.  I telephoned the   test results to Dr. Dierdre Highman at the time of interpretation.  Original Report Authenticated By: Osa Craver, M.D.          MDM  IV fluids. Zofran. Dilaudid. Labs and ultrasound obtained and reviewed as above. On recheck at 8 AM patient is much better with regard to pain and is stable for discharge home. Reliable historian states understanding discharge and followup instructions.        Sunnie Nielsen, MD 03/09/11 [phone]

## 2011-03-09 NOTE — ED Notes (Signed)
Patient discharged home with husband.

## 2011-03-09 NOTE — ED Notes (Signed)
Patient resting more comfortable and less nausea.

## 2011-03-09 NOTE — ED Notes (Signed)
Patient resting. Patient states she is still having nausea.

## 2011-03-09 NOTE — ED Notes (Signed)
Pt returned from [place] at this time. VSS; No signs of distress.

## 2011-03-09 NOTE — Discharge Instructions (Signed)

## 2011-03-09 NOTE — ED Notes (Signed)
Patient transported to Ultrasound 

## 2011-03-12 ENCOUNTER — Ambulatory Visit (INDEPENDENT_AMBULATORY_CARE_PROVIDER_SITE_OTHER): Payer: 59 | Admitting: Psychiatry

## 2011-03-12 ENCOUNTER — Encounter (HOSPITAL_COMMUNITY): Payer: Self-pay | Admitting: Psychiatry

## 2011-03-12 DIAGNOSIS — F329 Major depressive disorder, single episode, unspecified: Secondary | ICD-10-CM

## 2011-03-12 DIAGNOSIS — F410 Panic disorder [episodic paroxysmal anxiety] without agoraphobia: Secondary | ICD-10-CM

## 2011-03-12 MED ORDER — SERTRALINE HCL 100 MG PO TABS: ORAL_TABLET | ORAL | Status: DC

## 2011-03-12 MED ORDER — ESCITALOPRAM OXALATE 20 MG PO TABS: 20.0000 mg | ORAL_TABLET | Freq: Every day | ORAL | Status: DC

## 2011-03-12 MED ORDER — PRAZOSIN HCL 1 MG PO CAPS: 1.0000 mg | ORAL_CAPSULE | Freq: Every day | ORAL | Status: DC
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
## 2011-03-12 NOTE — Patient Instructions (Addendum)
Please decrease new refill Zoloft 100 mg to 1 1/2 tabs in pm   Start Lexapro as 20 mg  Call if Lexapro demonstrates any intolerable side effects.  Discussion of Lexapro Risks, Benefits and Alternative Treatments  are discussed and pt agrees Suicide risk = minimal   Recall  It is important to report any suicidal thoughts to contact office  or call the Crisis numbers you have been given  Make appt with KL  Return in 1 month.

## 2011-03-13 ENCOUNTER — Telehealth (HOSPITAL_COMMUNITY): Payer: Self-pay | Admitting: Psychiatry

## 2011-03-13 ENCOUNTER — Encounter (HOSPITAL_COMMUNITY): Payer: Self-pay | Admitting: Psychiatry

## 2011-03-13 DIAGNOSIS — F419 Anxiety disorder, unspecified: Secondary | ICD-10-CM

## 2011-03-13 MED ORDER — CLONAZEPAM 0.5 MG PO TABS: 0.5000 mg | ORAL_TABLET | Freq: Two times a day (BID) | ORAL | Status: DC | PRN

## 2011-03-13 NOTE — Telephone Encounter (Addendum)
Pt calls to report that she assumed she had enough medication until next appt 03/2711 and finds she does not.  Pt is told [facility] will be called and BZD Klonopin will be printed #20 pills for 10 days and waiting for her to pick up Rx.  [facility] confirms Rx filled 03/01/11 and Rx for # 10 days 0.5 mg BID is printed.   Pt states she will come for Rx 03/14/11  Waiting for desk confirmation Rx has been picked up. Envelop is in locked drawer.  Pt will pick it up next week.

## 2011-03-18 NOTE — Progress Notes (Signed)
Summary: Severe soe throat/chest congestion/cough   Vital Signs:  Patient Profile:   40 Years Old Female CC:      Sore throat, cough, fever,x 3 days Height:     63.5 inches Weight:      258 pounds O2 Sat:      98 % O2 treatment:    Room Air Temp:     98.0 degrees F oral Pulse rate:   81 / minute Pulse rhythm:   regular Resp:     18 per minute BP sitting:   122 / 90  (left arm) Cuff size:   large  Vitals Entered By: Emilio Math (January 04, 2011 9:10 AM)                  Current Allergies (reviewed today): ! BIAXIN ! AVELOXHistory of Present Illness Chief Complaint: Sore throat, cough, fever,x 3 days History of Present Illness: 40 Years Old Female complains of onset of cold symptoms for 3 days.  Myalee has been using Zyrtec which is helping a little bit.  She was here about 3 weeks ago with similar symptoms.  She is a Therapist, music at [employer]. + sore throat + cough No pleuritic pain No wheezing + nasal congestion +post-nasal drainage + sinus pain/pressure No chest congestion No itchy/red eyes No earache No hemoptysis No SOB No chills/sweats + fever + hoarseness No nausea No vomiting No abdominal pain No diarrhea No skin rashes + fatigue No myalgias No headache   Current Meds PROVENTIL HFA 108 (90 BASE) MCG/ACT AERS (ALBUTEROL SULFATE) prn SYMBICORT 160-4.5 MCG/ACT AERO (BUDESONIDE-FORMOTEROL FUMARATE) twice a day SYNTHROID 75 MCG TABS (LEVOTHYROXINE SODIUM) daily PRILOSEC 10 MG CPDR (OMEPRAZOLE) daily SERTRALINE HCL 100 MG TABS (SERTRALINE HCL) daily ZYRTEC ALLERGY 10 MG CAPS (CETIRIZINE HCL)  PREDNISONE (PAK) 10 MG TABS (PREDNISONE) 6 day pack, use as directed CHERATUSSIN AC 100-10 MG/5ML SYRP (GUAIFENESIN-CODEINE) 5cc q6 hrs as needed for cough AMOXICILLIN 875 MG TABS (AMOXICILLIN) 1 by mouth two times a day for 7 days  REVIEW OF SYSTEMS Constitutional Symptoms       Complains of fever.     Denies chills, night sweats, weight loss, weight gain, and  fatigue.  Eyes       Denies change in vision, eye pain, eye discharge, glasses, contact lenses, and eye surgery. Ear/Nose/Throat/Mouth       Complains of ear pain, sore throat, and hoarseness.      Denies hearing loss/aids, change in hearing, ear discharge, dizziness, frequent runny nose, frequent nose bleeds, sinus problems, and tooth pain or bleeding.  Respiratory       Complains of dry cough.      Denies productive cough, wheezing, shortness of breath, asthma, bronchitis, and emphysema/COPD.  Cardiovascular       Denies murmurs, chest pain, and tires easily with exhertion.    Gastrointestinal       Denies stomach pain, nausea/vomiting, diarrhea, constipation, blood in bowel movements, and indigestion. Genitourniary       Denies painful urination, kidney stones, and loss of urinary control. Neurological       Complains of headaches.      Denies paralysis, seizures, and fainting/blackouts. Musculoskeletal       Denies muscle pain, joint pain, joint stiffness, decreased range of motion, redness, swelling, muscle weakness, and gout.  Skin       Denies bruising, unusual mles/lumps or sores, and hair/skin or nail changes.  Psych       Denies mood changes, temper/anger issues,  anxiety/stress, speech problems, depression, and sleep problems.  Past History:  Past Medical History: Reviewed history from 05/07/2008 and no changes required. asthma thyroid disease  Past Surgical History: Reviewed history from 05/07/2008 and no changes required. right knee arthroscopy 8/07 cystoscopy 1993 laparoscopy 1997  Family History: Reviewed history from 12/15/2010 and no changes required. mother alive and healthy father deceased at age 76 with lung cancer brother deceased 65 - suicide sister alive and healthy Family History Hypertension  Social History: Reviewed history from 12/15/2010 and no changes required. 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 Physical Exam General appearance: well developed, well nourished, no acute distress, hoarseness Ears: normal, no lesions or deformities Nasal: clear discharge Oral/Pharynx: clear PND, mild erythema, no exudate Chest/Lungs: no rales, wheezes, or rhonchi bilateral, breath sounds equal without effort Heart: regular rate and  rhythm, no murmur MSE: oriented to time, place, and person Assessment New Problems: UPPER RESPIRATORY INFECTION, ACUTE (ICD-465.9) COUGH (ICD-786.2) SORE THROAT (ICD-462) RESPIRATORY DISORDER, ACUTE (ICD-465.9) COUGH (ICD-786.2)   Plan New Medications/Changes: AMOXICILLIN 875 MG TABS (AMOXICILLIN) 1 by mouth two times a day for 7 days  #14 x 0, 01/04/2011, Hoyt Koch MD CHERATUSSIN AC 100-10 MG/5ML SYRP (GUAIFENESIN-CODEINE) 5cc q6 hrs as needed for cough  #5oz x 0, 01/04/2011, Hoyt Koch MD PREDNISONE (PAK) 10 MG TABS (PREDNISONE) 6 day pack, use as directed  #1 x 0, 01/04/2011, Hoyt Koch MD  New Orders: Est. Patient Level IV [86578] Pulse Oximetry (single measurment) [94760] Rapid Strep [46962] T-Culture, Throat [95284-13244] Planning Comments:   1)  First take cough meds & prednisone.  Throat culture pending.  Rapid strep negative.  Take the prescribed antibiotic in a few days, especially if culture positive. 2)  Use nasal saline solution (over the counter) at least 3 times a day. 3)  Use over the counter decongestants like Zyrtec-D every 12 hours as needed to help with congestion. 4)  Can take tylenol every 6 hours or motrin every 8 hours for pain or fever. 5)  Follow up with your primary doctor  if no improvement in 5-7 days, sooner if increasing pain, fever, or new symptoms.    The patient and/or caregiver has been counseled thoroughly with regard to medications prescribed including dosage, schedule, interactions, rationale for use, and possible side effects and they verbalize understanding.  Diagnoses and expected course  of recovery discussed and will return if not improved as expected or if the condition worsens. Patient and/or caregiver verbalized understanding.  Prescriptions: AMOXICILLIN 875 MG TABS (AMOXICILLIN) 1 by mouth two times a day for 7 days  #14 x 0   Entered and Authorized by:   Hoyt Koch MD   Signed by:   Hoyt Koch MD on 01/04/2011   Method used:   Print then Give to Patient   RxID:   [id number] CHERATUSSIN AC 100-10 MG/5ML SYRP (GUAIFENESIN-CODEINE) 5cc q6 hrs as needed for cough  #5oz x 0   Entered and Authorized by:   Hoyt Koch MD   Signed by:   Hoyt Koch MD on 01/04/2011   Method used:   Print then Give to Patient   RxID:   [id number] PREDNISONE (PAK) 10 MG TABS (PREDNISONE) 6 day pack, use as directed  #1 x 0   Entered and Authorized by:   Hoyt Koch MD   Signed by:   Hoyt Koch MD on 01/04/2011   Method used:   Print then Give to Patient  RxID:   [id number]   Orders Added: 1)  Est. Patient Level IV [27253] 2)  Pulse Oximetry (single measurment) [94760] 3)  Rapid Strep [66440] 4)  T-Culture, Throat [[id number]]    Laboratory Results  Date/Time Received: January 04, 2011 9:19 AM  Date/Time Reported: January 04, 2011 9:19 AM   Other Tests  Rapid Strep: negative

## 2011-03-18 NOTE — Progress Notes (Signed)
Summary: COUGH/SORE THROAT (room 4)   Vital Signs:  Patient Profile:   40 Years Old Female CC:      sore throat, cough, left ear pain x 10 days Height:     63.5 inches Weight:      256.50 pounds O2 Sat:      97 % O2 treatment:    Room Air Temp:     98.3 degrees F oral Pulse rate:   83 / minute Resp:     16 per minute BP sitting:   129 / 92  (left arm) Cuff size:   regular  Vitals Entered By: Lavell Islam RN (December 15, 2010 1:20 PM)                  Current Allergies (reviewed today): ! BIAXIN ! AVELOXHistory of Present Illness Chief Complaint: sore throat, cough, left ear pain x 10 days History of Present Illness:  Subjective: Patient complains of sore throat and URI symptoms for about 10 days.  She has a past history of sinus surgery and now has persistent left ear pain + cough, non-productive and worse at night No pleuritic pain No wheezing + nasal congestion ? post-nasal drainage + sinus pain/pressure No itchy/red eyes No hemoptysis No SOB No fever/chills but has night sweats No nausea No vomiting No abdominal pain No diarrhea No skin rashes + fatigue No myalgias No headache Used OTC meds without relief   REVIEW OF SYSTEMS Constitutional Symptoms      Denies fever, chills, night sweats, weight loss, weight gain, and fatigue.  Eyes       Denies change in vision, eye pain, eye discharge, glasses, contact lenses, and eye surgery. Ear/Nose/Throat/Mouth       Complains of ear pain, sore throat, and hoarseness.      Denies hearing loss/aids, change in hearing, ear discharge, dizziness, frequent runny nose, frequent nose bleeds, sinus problems, and tooth pain or bleeding.  Respiratory       Complains of dry cough and wheezing.      Denies productive cough, shortness of breath, asthma, bronchitis, and emphysema/COPD.  Cardiovascular       Denies murmurs, chest pain, and tires easily with exhertion.    Gastrointestinal       Complains of  nausea/vomiting.      Denies stomach pain, diarrhea, constipation, blood in bowel movements, and indigestion.      Comments: with cough Genitourniary       Denies painful urination, kidney stones, and loss of urinary control. Neurological       Denies paralysis, seizures, and fainting/blackouts. Musculoskeletal       Denies muscle pain, joint pain, joint stiffness, decreased range of motion, redness, swelling, muscle weakness, and gout.  Skin       Denies bruising, unusual mles/lumps or sores, and hair/skin or nail changes.  Psych       Denies mood changes, temper/anger issues, anxiety/stress, speech problems, depression, and sleep problems. Other Comments: URI  and sore throat; ear pain x 10 days   Past History:  Past Medical History: Reviewed history from 05/07/2008 and no changes required. asthma thyroid disease  Past Surgical History: Reviewed history from 05/07/2008 and no changes required. right knee arthroscopy 8/07 cystoscopy 1993 laparoscopy 1997  Family History: Reviewed history from 05/07/2008 and no changes required. mother alive and healthy father deceased at age 46 with lung cancer brother deceased 53 - suicide sister alive and healthy Family History Hypertension  Social History: Reviewed history  from 05/07/2008 and no changes required. 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  Extraocular movement is intact.  Conjunctivae are not inflamed.  Ears:  Canals normal.  Tympanic membranes normal.   Nose:  Mildly congested turbinates.  No sinus tenderness  Pharynx:  Minimal erythema Neck:  Supple.  Slightly tender shotty posterior nodes are palpated bilaterally.  Lungs:  Clear to auscultation.  Breath sounds are equal.  Heart:  Regular rate and rhythm without murmurs, rubs, or gallops.  Abdomen:  Nontender  without masses or hepatosplenomegaly.  Bowel sounds are present.  No CVA or flank tenderness.  Extremities:  No edema.   Rapid strep test negative  Tympanogram:  positive peak pressure left ear; high peak height right ear Assessment New Problems: DYSFUNCTION OF EUSTACHIAN TUBE (ICD-381.81) UPPER RESPIRATORY INFECTION, ACUTE (ICD-465.9)  PERSISTENT VIRAL URI; SUSPECT ET DYSFUNCTION.    Plan New Medications/Changes: AMOXICILLIN 875 MG TABS (AMOXICILLIN) One by mouth two times a day (Rx void after 12/22/10)  #20 x 0, 12/15/2010, Donna Christen MD CHERATUSSIN AC 100-10 MG/5ML SYRP (GUAIFENESIN-CODEINE) 5cc to 10cc by mouth hs as needed cough  #4oz x 0, 12/15/2010, Donna Christen MD DEXPAK 6 DAY 1.5 MG TABS (DEXAMETHASONE) Take as directed  #1 x 0, 12/15/2010, Donna Christen MD  New Orders: Pulse Oximetry (single measurment) [94760] Tympanometry [92567] Rapid Strep [16109] Est. Patient Level IV [60454] Services provided After hours-Weekends-Holidays [99051] Planning Comments:   Begin 6 day Dexpak, expectorant/decongestant, topical decongestant,  cough suppressant at bedtime.  Increase fluid intake If not improving 5 days add amoxicillin Followup with PCP if not improving 7 to 10 days   The patient and/or caregiver has been counseled thoroughly with regard to medications prescribed including dosage, schedule, interactions, rationale for use, and possible side effects and they verbalize understanding.  Diagnoses and expected course of recovery discussed and will return if not improved as expected or if the condition worsens. Patient and/or caregiver verbalized understanding.  Prescriptions: AMOXICILLIN 875 MG TABS (AMOXICILLIN) One by mouth two times a day (Rx void after 12/22/10)  #20 x 0   Entered and Authorized by:   Donna Christen MD   Signed by:   Donna Christen MD on 12/15/2010   Method used:   Print then Give to Patient   RxID:   [id number] CHERATUSSIN AC 100-10 MG/5ML SYRP  (GUAIFENESIN-CODEINE) 5cc to 10cc by mouth hs as needed cough  #4oz x 0   Entered and Authorized by:   Donna Christen MD   Signed by:   Donna Christen MD on 12/15/2010   Method used:   Print then Give to Patient   RxID:   [id number] DEXPAK 6 DAY 1.5 MG TABS (DEXAMETHASONE) Take as directed  #1 x 0   Entered and Authorized by:   Donna Christen MD   Signed by:   Donna Christen MD on 12/15/2010   Method used:   Print then Give to Patient   RxID:   [id number]   Patient Instructions: 1)  Take Mucinex D (guaifenesin with decongestant) twice daily for congestion. 2)  Increase fluid intake, rest. 3)  May use Afrin nasal spray (or generic oxymetazoline) twice daily for about 5 days.  Also recommend using saline nasal spray several times daily and/or saline nasal irrigation. 4)  Begin Amoxicillin if not improving about 5 days or if persistent  fever develops. 5)  Followup with family doctor if not improving 7 to 10 days.   Orders Added: 1)  Pulse Oximetry (single measurment) [94760] 2)  Tympanometry [92567] 3)  Rapid Strep [40102] 4)  Est. Patient Level IV [72536] 5)  Services provided After hours-Weekends-Holidays [99051]    Laboratory Results    Other Tests  Rapid Strep: negative  Kit Test Internal QC: Negative   (Normal Range: Negative)

## 2011-03-18 NOTE — Progress Notes (Signed)
Summary: COUGH/CONGESTION (room 4)   Vital Signs:  Patient Profile:   40 Years Old Female CC:      persistant cough/congestion Height:     63.5 inches Weight:      258 pounds O2 Sat:      97 % O2 treatment:    Room Air Temp:     98.4 degrees F oral Pulse rate:   96 / minute Resp:     16 per minute BP sitting:   144 / 87  (left arm) Cuff size:   large  Vitals Entered By: Lavell Islam RN (January 12, 2011 1:56 PM)             Comments Seen 01-04-11 for same; just completed ABX      Updated Prior Medication List: PROVENTIL HFA 108 (90 BASE) MCG/ACT AERS (ALBUTEROL SULFATE) prn SYMBICORT 160-4.5 MCG/ACT AERO (BUDESONIDE-FORMOTEROL FUMARATE) twice a day SYNTHROID 75 MCG TABS (LEVOTHYROXINE SODIUM) daily PRILOSEC 10 MG CPDR (OMEPRAZOLE) daily SERTRALINE HCL 100 MG TABS (SERTRALINE HCL) daily ZYRTEC ALLERGY 10 MG CAPS (CETIRIZINE HCL)  PREDNISONE (PAK) 10 MG TABS (PREDNISONE) 6 day pack, use as directed CHERATUSSIN AC 100-10 MG/5ML SYRP (GUAIFENESIN-CODEINE) 5cc q6 hrs as needed for cough  Current Allergies (reviewed today): ! BIAXIN ! AVELOXHistory of Present Illness Chief Complaint: persistant cough/congestion History of Present Illness:  Subjective:  Patient reports that she was improving after her previous visit, but about 3 to 4 days ago she became acutely worse with increased sore throat, increased sinus congestion, and increased cough.  She has had myalgias and sweats over the past two days.  She has soreness in her ribs from coughing (worse at night) but no pleuritic pain.  She has no wheezing or shortness of breath, however.  She continues her Symbicort inhaler and has not needed her albuterol inhaler.  Her Tdap is current .  No GI or GU symptoms.  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  REVIEW OF SYSTEMS Constitutional Symptoms       Complains of fever and night sweats.  Ear/Nose/Throat/Mouth       Complains of hoarseness.    Respiratory       Complains of dry cough and productive cough.      Comments: chest discomfort with cough     Other Comments: cough and congestion persistant despite ABX ending 01-11-11   Past History:  Past Medical History: Reviewed history from 05/07/2008 and no changes required. asthma thyroid disease  Past Surgical History: Reviewed history from 05/07/2008 and no changes required. right knee arthroscopy 8/07 cystoscopy 1993 laparoscopy 1997  Family History: Reviewed history from 12/15/2010 and no changes required. mother alive and healthy father deceased at age 21 with lung cancer brother deceased 91 - suicide sister alive and healthy Family History Hypertension  Social History: Reviewed history from 12/15/2010 and no changes required. 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  Extraocular movement is intact.  Conjunctivae are not inflamed.  Ears:  Canals normal.  Tympanic membranes normal.   Nose:  Mildly congested turbinates.  No sinus tenderness  Pharynx:  Normal  Neck:  Supple.  Slightly tender shotty posterior nodes are palpated bilaterally.  Lungs:  Clear to auscultation.  Breath sounds are equal.  Heart:  Regular rate and  rhythm without murmurs, rubs, or gallops.  Abdomen:  Nontender without masses or hepatosplenomegaly.  Bowel sounds are present.  No CVA or flank tenderness.  Extremities:  No edema.  CBC:  WBC 8.5 ; LY 24.3, MO 8.4, GR 67.3; Hgb 12.4 Chest X-ray:   Mild peri-hilar increased bronchial markings.  No focal consolidation. Assessment  Assessed UPPER RESPIRATORY INFECTION, ACUTE as deteriorated - Donna Christen MD SUSPECT NEW VIRAL URI WITHOUT BACTERIAL INFECTION  Plan New Medications/Changes: Sandria Senter ER 8-10 MG/5ML LQCR  (CHLORPHENIRAMINE-HYDROCODONE) 5 cc by mouth hs as needed cough  #2oz x 0, 01/12/2011, Donna Christen MD AZITHROMYCIN 250 MG TABS (AZITHROMYCIN) Two tabs by mouth on day 1, then 1 tab daily on days 2 through 5 (Rx void after 01/21/11)  #6 tabs x 0, 01/12/2011, Donna Christen MD  New Orders: CBC w/Diff [16109-60454] T-Chest x-ray, 2 views [71020] Pulse Oximetry (single measurment) [94760] Est. Patient Level IV [09811] Services provided After hours-Weekends-Holidays [99051] Planning Comments:   Chart reviewed (previous visits) Treat symptomatically for now:  Continue expectorant/decongestant, cough suppressant at bedtime, increased fluids, rest, Symbicort inhaler and albuterol as needed.  If not improving about 5 days or if persistent fever develops, begin Z-pack. Follow-up with PCP if not improving about one week.   The patient and/or caregiver has been counseled thoroughly with regard to medications prescribed including dosage, schedule, interactions, rationale for use, and possible side effects and they verbalize understanding.  Diagnoses and expected course of recovery discussed and will return if not improved as expected or if the condition worsens. Patient and/or caregiver verbalized understanding.  Prescriptions: Sandria Senter ER 8-10 MG/5ML LQCR (CHLORPHENIRAMINE-HYDROCODONE) 5 cc by mouth hs as needed cough  #2oz x 0   Entered and Authorized by:   Donna Christen MD   Signed by:   Donna Christen MD on 01/12/2011   Method used:   Print then Give to Patient   RxID:   [id number] AZITHROMYCIN 250 MG TABS (AZITHROMYCIN) Two tabs by mouth on day 1, then 1 tab daily on days 2 through 5 (Rx void after 01/21/11)  #6 tabs x 0   Entered and Authorized by:   Donna Christen MD   Signed by:   Donna Christen MD on 01/12/2011   Method used:   Print then Give to Patient   RxID:   [id number]   Orders Added: 1)  CBC w/Diff [40102-72536] 2)  T-Chest x-ray, 2 views [71020] 3)  Pulse  Oximetry (single measurment) [94760] 4)  Est. Patient Level IV [64403] 5)  Services provided After hours-Weekends-Holidays [47425]

## 2011-03-20 ENCOUNTER — Ambulatory Visit: Payer: 59 | Admitting: Internal Medicine

## 2011-03-21 ENCOUNTER — Ambulatory Visit (HOSPITAL_COMMUNITY): Payer: 59 | Admitting: Psychology

## 2011-03-22 ENCOUNTER — Ambulatory Visit (INDEPENDENT_AMBULATORY_CARE_PROVIDER_SITE_OTHER): Payer: 59 | Admitting: Internal Medicine

## 2011-03-22 ENCOUNTER — Other Ambulatory Visit (INDEPENDENT_AMBULATORY_CARE_PROVIDER_SITE_OTHER): Payer: 59

## 2011-03-22 ENCOUNTER — Encounter: Payer: Self-pay | Admitting: Internal Medicine

## 2011-03-22 DIAGNOSIS — K219 Gastro-esophageal reflux disease without esophagitis: Secondary | ICD-10-CM | POA: Insufficient documentation

## 2011-03-22 DIAGNOSIS — J45901 Unspecified asthma with (acute) exacerbation: Secondary | ICD-10-CM | POA: Insufficient documentation

## 2011-03-22 DIAGNOSIS — J45909 Unspecified asthma, uncomplicated: Secondary | ICD-10-CM

## 2011-03-22 DIAGNOSIS — E039 Hypothyroidism, unspecified: Secondary | ICD-10-CM

## 2011-03-22 DIAGNOSIS — Z23 Encounter for immunization: Secondary | ICD-10-CM

## 2011-03-22 LAB — COMPREHENSIVE METABOLIC PANEL
ALT: 28 U/L (ref 0–35)
AST: 24 U/L (ref 0–37)
Albumin: 3.9 g/dL (ref 3.5–5.2)
Alkaline Phosphatase: 70 U/L (ref 39–117)
BUN: 11 mg/dL (ref 6–23)
Calcium: 8.8 mg/dL (ref 8.4–10.5)
Chloride: 104 mEq/L (ref 96–112)
Potassium: 3.6 mEq/L (ref 3.5–5.1)
Sodium: 140 mEq/L (ref 135–145)
Total Protein: 7.6 g/dL (ref 6.0–8.3)

## 2011-03-22 LAB — LIPID PANEL
LDL Cholesterol: 122 mg/dL — ABNORMAL HIGH (ref 0–99)
Total CHOL/HDL Ratio: 3
Triglycerides: 74 mg/dL (ref 0.0–149.0)

## 2011-03-22 MED ORDER — LEVOTHYROXINE SODIUM 125 MCG PO TABS: 125.0000 ug | ORAL_TABLET | Freq: Every day | ORAL | Status: DC

## 2011-03-22 MED ORDER — ESOMEPRAZOLE MAGNESIUM 40 MG PO CPDR: 40.0000 mg | DELAYED_RELEASE_CAPSULE | Freq: Every day | ORAL | Status: DC

## 2011-03-22 MED ORDER — FLUTICASONE-SALMETEROL 250-50 MCG/DOSE IN AEPB: 1.0000 | INHALATION_SPRAY | Freq: Two times a day (BID) | RESPIRATORY_TRACT | Status: DC

## 2011-03-22 NOTE — Assessment & Plan Note (Addendum)
I will check her TSH and FLP  Late note: she needs a higher synthroid dose

## 2011-03-22 NOTE — Progress Notes (Signed)
Subjective:    Patient ID: [name], female    DOB: 1970-12-28, 40 y.o.   MRN: [id number]  Asthma She complains of wheezing. There is no chest tightness, cough, difficulty breathing, frequent throat clearing, hemoptysis, hoarse voice, shortness of breath or sputum production. This is a recurrent problem. The current episode started more than 1 year ago. The problem occurs intermittently. The problem has been unchanged. Pertinent negatives include no appetite change, chest pain, dyspnea on exertion, ear congestion, ear pain, fever, headaches, heartburn, malaise/fatigue, myalgias, nasal congestion, orthopnea, PND, postnasal drip, rhinorrhea, sneezing, sore throat, sweats, trouble swallowing or weight loss. Her symptoms are aggravated by change in weather. Her symptoms are alleviated by beta-agonist. She reports significant improvement on treatment. Her past medical history is significant for asthma.  Thyroid Problem Presents for follow-up visit. Symptoms include constipation and fatigue. Patient reports no anxiety, cold intolerance, depressed mood, diaphoresis, diarrhea, dry skin, hair loss, heat intolerance, hoarse voice, leg swelling, menstrual problem, nail problem, palpitations, tremors, visual change, weight gain or weight loss. The symptoms have been worsening.      Review of Systems  Constitutional: Positive for fatigue. Negative for fever, chills, weight loss, weight gain, malaise/fatigue, diaphoresis, activity change, appetite change and unexpected weight change.  HENT: Negative for ear pain, sore throat, hoarse voice, facial swelling, rhinorrhea, sneezing, trouble swallowing, neck pain, neck stiffness, voice change and postnasal drip.   Eyes: Negative.   Respiratory: Positive for wheezing. Negative for apnea, cough, hemoptysis, sputum production, choking, chest tightness, shortness of breath and stridor.   Cardiovascular: Negative for chest pain, dyspnea on exertion, palpitations,  leg swelling and PND.  Gastrointestinal: Positive for constipation. Negative for heartburn, nausea, vomiting, abdominal pain, diarrhea, blood in stool, anal bleeding and rectal pain.  Genitourinary: Negative for dysuria, urgency, frequency, hematuria, flank pain, decreased urine volume, enuresis, difficulty urinating, menstrual problem and dyspareunia.  Musculoskeletal: Negative for myalgias, back pain, joint swelling, arthralgias and gait problem.  Skin: Negative for color change, pallor, rash and wound.  Neurological: Negative for dizziness, tremors, seizures, syncope, facial asymmetry, speech difficulty, weakness, light-headedness, numbness and headaches.  Hematological: Negative for cold intolerance, heat intolerance and adenopathy. Does not bruise/bleed easily.  Psychiatric/Behavioral: Negative.        Objective:   Physical Exam  Vitals reviewed. Constitutional: She is oriented to person, place, and time. She appears well-developed and well-nourished. No distress.  HENT:  Head: Normocephalic and atraumatic.  Mouth/Throat: Oropharynx is clear and moist. No oropharyngeal exudate.  Eyes: Conjunctivae are normal. Right eye exhibits no discharge. Left eye exhibits no discharge. No scleral icterus.  Neck: Normal range of motion. Neck supple. No JVD present. No tracheal deviation present. No thyromegaly present.  Cardiovascular: Normal rate, regular rhythm, normal heart sounds and intact distal pulses.  Exam reveals no gallop and no friction rub.   No murmur heard. Pulmonary/Chest: Effort normal and breath sounds normal. No stridor. No respiratory distress. She has no wheezes. She has no rales. She exhibits no tenderness.  Abdominal: Soft. Bowel sounds are normal. She exhibits no distension. There is no tenderness. There is no rebound and no guarding.  Musculoskeletal: Normal range of motion. She exhibits no edema and no tenderness.  Lymphadenopathy:    She has no cervical adenopathy.    Neurological: She is oriented to person, place, and time.  Skin: Skin is warm and dry. No rash noted. She is not diaphoretic. No erythema. No pallor.  Psychiatric: She has a normal mood and affect. Her behavior  is normal. Judgment and thought content normal.      Lab Results  Component Value Date   WBC 10.4 03/09/2011   HGB 12.4 03/09/2011   HCT 36.8 03/09/2011   PLT 299 03/09/2011   GLUCOSE 94 03/09/2011   ALT 29 03/09/2011   AST 28 03/09/2011   NA 136 03/09/2011   K 4.1 03/09/2011   CL 98 03/09/2011   CREATININE 0.64 03/09/2011   BUN 11 03/09/2011   CO2 25 03/09/2011      Assessment & Plan:

## 2011-03-22 NOTE — Patient Instructions (Signed)
Hypothyroidism The thyroid is a large gland located in the lower front of your neck. The thyroid gland helps control metabolism. Metabolism is how your body handles food. It controls metabolism with the hormone thyroxine. When this gland is underactive (hypothyroid), it produces too little hormone.  CAUSES These include:   Absence or destruction of thyroid tissue.   Goiter due to iodine deficiency.   Goiter due to medications.   Congenital defects (since birth).   Problems with the pituitary. This causes a lack of TSH (thyroid stimulating hormone). This hormone tells the thyroid to turn out more hormone.  SYMPTOMS  Lethargy (feeling as though you have no energy)   Cold intolerance   Weight gain (in spite of normal food intake)   Dry skin   Coarse hair   Menstrual irregularity (if severe, may lead to infertility)   Slowing of thought processes  Cardiac problems are also caused by insufficient amounts of thyroid hormone. Hypothyroidism in the newborn is cretinism, and is an extreme form. It is important that this form be treated adequately and immediately or it will lead rapidly to retarded physical and mental development. DIAGNOSIS  To prove hypothyroidism, your caregiver may do blood tests and ultrasound tests. Sometimes the signs are hidden. It may be necessary for your caregiver to watch this illness with blood tests either before or after diagnosis and treatment. TREATMENT  Low levels of thyroid hormone are increased by using synthetic thyroid hormone. This is a safe, effective treatment. It usually takes about four weeks to gain the full effects of the medication. After you have the full effect of the medication, it will generally take another four weeks for problems to leave. Your caregiver may start you on low doses. If you have had heart problems the dose may be gradually increased. It is generally not an emergency to get rapidly to normal. HOME CARE INSTRUCTIONS   Take  your medications as your caregiver suggests. Let your caregiver know of any medications you are taking or start taking. Your caregiver will help you with dosage schedules.   As your condition improves, your dosage needs may increase. It will be necessary to have continuing blood tests as suggested by your caregiver.   Report all suspected medication side effects to your caregiver.  SEEK MEDICAL CARE IF: Seek medical care if you develop:  Sweating.   Tremulousness (tremors).   Anxiety.   Rapid weight loss.   Heat intolerance.   Emotional swings.   Diarrhea.   Weakness.  SEEK IMMEDIATE MEDICAL CARE IF:  You develop chest pain, an irregular heart beat (palpitations), or a rapid heart beat. MAKE SURE YOU:   Understand these instructions.   Will watch your condition.   Will get help right away if you are not doing well or get worse.  Document Released: 04/01/2005 Document Revised: 12/12/2010 Document Reviewed: 11/20/2007 [facility] Patient Information 2012 [place], [place].Asthma, Adult Asthma is caused by narrowing of the air passages in the lungs. It may be triggered by pollen, dust, animal dander, molds, some foods, respiratory infections, exposure to smoke, exercise, emotional stress or other allergens (things that cause allergic reactions or allergies). Repeat attacks are common. HOME CARE INSTRUCTIONS   Use prescription medications as ordered by your caregiver.   Avoid pollen, dust, animal dander, molds, smoke and other things that cause attacks at home and at work.   You may have fewer attacks if you decrease dust in your home. Electrostatic air cleaners may help.   It may  help to replace your pillows or mattress with materials less likely to cause allergies.   Talk to your caregiver about an action plan for managing asthma attacks at home, including, the use of a peak flow meter which measures the severity of your asthma attack. An action plan can help minimize or stop  the attack without having to seek medical care.   If you are not on a fluid restriction, drink 8 to 10 glasses of water each day.   Always have a plan prepared for seeking medical attention, including, calling your physician, accessing local emergency care, and calling 911 (in the [place] for a severe attack.   Discuss possible exercise routines with your caregiver.   If animal dander is the cause of asthma, you may need to get rid of pets.  SEEK MEDICAL CARE IF:   You have wheezing and shortness of breath even if taking medicine to prevent attacks.   You have muscle aches, chest pain or thickening of sputum.   Your sputum changes from clear or white to yellow, green, gray, or bloody.   You have any problems that may be related to the medicine you are taking (such as a rash, itching, swelling or trouble breathing).  SEEK IMMEDIATE MEDICAL CARE IF:   Your usual medicines do not stop your wheezing or there is increased coughing and/or shortness of breath.   You have increased difficulty breathing.   You have a fever.  MAKE SURE YOU:   Understand these instructions.   Will watch your condition.   Will get help right away if you are not doing well or get worse.  Document Released: 04/01/2005 Document Revised: 12/12/2010 Document Reviewed: 11/18/2007 [facility] Patient Information 2012 [place], [place].

## 2011-03-22 NOTE — Assessment & Plan Note (Signed)
Continue advair diskus 

## 2011-03-22 NOTE — Progress Notes (Signed)
Addended by: Etta Grandchild on: 03/22/2011 02:14 PM   Modules accepted: Orders

## 2011-03-22 NOTE — Assessment & Plan Note (Signed)
Continue nexium 

## 2011-03-25 ENCOUNTER — Ambulatory Visit (HOSPITAL_COMMUNITY): Payer: 59 | Admitting: Psychology

## 2011-04-11 ENCOUNTER — Ambulatory Visit (HOSPITAL_COMMUNITY): Payer: 59 | Admitting: Psychiatry

## 2011-04-11 ENCOUNTER — Encounter: Payer: Self-pay | Admitting: *Deleted

## 2011-04-11 ENCOUNTER — Emergency Department
Admission: EM | Admit: 2011-04-11 | Discharge: 2011-04-11 | Disposition: A | Discharge status: Home / Self Care | Payer: 59 | Source: Home / Self Care | Attending: Family Medicine | Admitting: Family Medicine

## 2011-04-11 DIAGNOSIS — R6889 Other general symptoms and signs: Secondary | ICD-10-CM

## 2011-04-11 DIAGNOSIS — J111 Influenza due to unidentified influenza virus with other respiratory manifestations: Secondary | ICD-10-CM

## 2011-04-11 MED ORDER — GUAIFENESIN-CODEINE 100-10 MG/5ML PO SYRP: ORAL_SOLUTION | ORAL | Status: AC

## 2011-04-11 MED ORDER — OSELTAMIVIR PHOSPHATE 75 MG PO CAPS: 75.0000 mg | ORAL_CAPSULE | Freq: Two times a day (BID) | ORAL | Status: AC

## 2011-04-11 NOTE — ED Provider Notes (Addendum)
History     CSN: [id number]  Arrival date & time 04/11/11  0806   None     Chief Complaint  Patient presents with  . Fever  . Cough      HPI Comments: HPI : Flu symptoms for about 2 days.  Fever to 101+ with chills, sweats, myalgias, fatigue, headache. Symptoms are progressively worsening, despite trying OTC fever reducing medicine and rest and fluids. Has decreased appetite, but tolerating some liquids by mouth.  She did have a flu shot in October.  She notes that she has had some occasional wheezing and has used her albuterol inhaler.  Review of Systems: Positive for fatigue, mild nasal congestion, mild sore throat, mild swollen anterior neck glands, mild cough, mild wheezing. Negative for acute vision changes, stiff neck, focal weakness, syncope, seizures, respiratory distress, vomiting, diarrhea, GU symptoms.   Patient is a 40 y.o. female presenting with fever and cough. The history is provided by the patient.  Fever Primary symptoms of the febrile illness include fever and cough.  Cough    Past Medical History  Diagnosis Date  . Kidney stones   . Asthma   . Depression   . Anxiety   . GERD (gastroesophageal reflux disease)   . Hypothyroidism   . Endometriosis   . History of sinus surgery 2010    MAXILLARY, ETHMOID, SPHENOID  . Obesity, Class III, BMI 40-49.9 (morbid obesity)     Past Surgical History  Procedure Date  . Knee surgery   . Nasal sinus surgery 2010  . Colposcopy 2007  . Tubal ligation     Family History  Problem Relation Age of Onset  . Cancer Father     lung  . Alcohol abuse Father   . Cancer Other   . Depression Mother   . Depression Sister   . Suicidality Brother   . Depression Brother   . Alcohol abuse Brother   . Drug abuse Brother   . Depression Sister     History  Substance Use Topics  . Smoking status: Never Smoker   . Smokeless tobacco: Not on file  . Alcohol Use: No    OB History    Grav Para Term Preterm Abortions  TAB SAB Ect Mult Living                  Review of Systems  Constitutional: Positive for fever.  Respiratory: Positive for cough.     Allergies  Avelox; Biaxin; Clarithromycin; and Moxifloxacin  Home Medications   Current Outpatient Rx  Name Route Sig Dispense Refill  . CETIRIZINE HCL 10 MG PO CHEW Oral Chew 10 mg by mouth daily.      Marland Kitchen CLONAZEPAM 0.5 MG PO TABS Oral Take 1 tablet (0.5 mg total) by mouth 2 (two) times daily as needed. For anxiety and ptsd 20 tablet 0  . ESOMEPRAZOLE MAGNESIUM 40 MG PO CPDR Oral Take 1 capsule (40 mg total) by mouth daily. 90 capsule 3  . FLINTSTONES COMPLETE 60 MG PO CHEW Oral Chew 2 tablets by mouth daily.      Marland Kitchen FLUTICASONE-SALMETEROL 250-50 MCG/DOSE IN AEPB Inhalation Inhale 1 puff into the lungs 2 (two) times daily. 1 each 11  . GUAIFENESIN-CODEINE 100-10 MG/5ML PO SYRP  Take 5 to 10 ML by mouth at bedtime as needed for cough 120 mL 0  . IBUPROFEN 200 MG PO TABS Oral Take 400 mg by mouth every 6 (six) hours as needed. Headache and pain     .  LEVOTHYROXINE SODIUM 125 MCG PO TABS Oral Take 1 tablet (125 mcg total) by mouth daily. 30 tablet 11  . OSELTAMIVIR PHOSPHATE 75 MG PO CAPS Oral Take 1 capsule (75 mg total) by mouth every 12 (twelve) hours. 10 capsule 0  . SERTRALINE HCL 100 MG PO TABS  Start change in dose 1 1/2 tab daily 45 tablet 0    Just received refill. Do not fill today    BP 117/78  Pulse 97  Temp(Src) 98.4 F (36.9 C) (Oral)  Resp 18  Ht 5\' 4"  (1.626 m)  Wt 252 lb 8 oz (114.533 kg)  BMI 43.34 kg/m2  SpO2 98%  LMP 03/17/2011  Physical Exam Nursing notes and Vital Signs reviewed. Appearance:  Patient appears healthy, stated age, and in no acute distress.  Patient is morbidly obese (BMI 43.4) Eyes:  Pupils are equal, round, and reactive to light and accomodation.  Extraocular movement is intact.  Conjunctivae are not inflamed  Ears:  Canals normal.  Tympanic membranes normal.  Nose:  Mildly congested turbinates.  No sinus  tenderness.    Pharynx:  Normal Neck:  Supple.  Slightly tender shotty anterior/posterior nodes are palpated bilaterally  Lungs:  Clear to auscultation.  Breath sounds are equal.  Chest:  Distinct tenderness to palpation over the mid-sternum.  Heart:  Regular rate and rhythm without murmurs, rubs, or gallops.  Abdomen:  Nontender without masses or hepatosplenomegaly.  Bowel sounds are present.  No CVA or flank tenderness.  Extremities:  No edema.  No calf tenderness Skin:  No rash present.   ED Course  Procedures  none      1. Influenza-like illness       MDM  Although patient has had flu shot, her symptoms are distinctly flu-like. Begin Tamiflu;  Tessalon at bedtime  Take Mucinex D (guaifenesin with decongestant) twice daily for congestion.  Increase fluid intake, rest. May use Afrin nasal spray (or generic oxymetazoline) twice daily for about 5 days.  Also recommend using saline nasal spray several times daily and/or saline nasal irrigation. Continue inhalers. Stop all antihistamines for now, and other non-prescription cough/cold preparations. May take Ibuprofen 200mg , 4 tabs every 8 hours with food for chest/sternum discomfort. Follow-up with family doctor if not improving about 5 days       Donna Christen, MD 04/11/11 [id number]  Donna Christen, MD 04/16/11 [phone]

## 2011-04-11 NOTE — ED Notes (Signed)
Pt c/o cough, fever, and body aches x Tuesday PM. She took delsym last night and IBF @ 6:00AM today.

## 2011-04-12 ENCOUNTER — Encounter (HOSPITAL_COMMUNITY): Payer: Self-pay | Admitting: Psychiatry

## 2011-04-12 ENCOUNTER — Ambulatory Visit (INDEPENDENT_AMBULATORY_CARE_PROVIDER_SITE_OTHER): Payer: 59 | Admitting: Psychiatry

## 2011-04-12 DIAGNOSIS — F411 Generalized anxiety disorder: Secondary | ICD-10-CM

## 2011-04-12 DIAGNOSIS — F419 Anxiety disorder, unspecified: Secondary | ICD-10-CM

## 2011-04-12 DIAGNOSIS — F329 Major depressive disorder, single episode, unspecified: Secondary | ICD-10-CM

## 2011-04-12 MED ORDER — SERTRALINE HCL 100 MG PO TABS: ORAL_TABLET | ORAL | Status: DC

## 2011-04-12 MED ORDER — CLONAZEPAM 0.5 MG PO TABS: 0.5000 mg | ORAL_TABLET | Freq: Two times a day (BID) | ORAL | Status: DC | PRN

## 2011-04-12 MED ORDER — VENLAFAXINE HCL ER 37.5 MG PO CP24: 37.5000 mg | ORAL_CAPSULE | Freq: Every day | ORAL | Status: DC

## 2011-04-12 NOTE — Patient Instructions (Signed)
Uric Klonopin and Zoloft have been renewed and you are to stop taking the Lexapro to 2 weird dreams. 2 begin taking the Effexor 37.5 mg for your anxiety. Remember that there may be early side effects thaT will fade over time be sure to call the office 9 AM to 4:30 PM if you have any adverse reactions. Also remembered to call the office if you're having any sudden or progressive thoughts of suicide. On make an appointment with Merlene Morse for therapy and return in 1 month to evaluate the new medication Effexor.

## 2011-04-12 NOTE — Progress Notes (Signed)
Patient ID: [name], female   DOB: [birth year], 40 y.o.   MRN: [id number] Natania is here today and says she the flu she has been to the doctor and he has given her Tamiflu she is coughing and says all the all she wants to do is sleep. She is focused on taking more fluids. She reports the medication Lexapro and Zoloft have helped with her mood. The Klonopin also helps with anxiety. She denies suicidal and is focused on feeling much better.

## 2011-04-15 ENCOUNTER — Emergency Department
Admission: EM | Admit: 2011-04-15 | Discharge: 2011-04-15 | Disposition: A | Discharge status: Home / Self Care | Payer: 59 | Source: Home / Self Care | Attending: Emergency Medicine | Admitting: Emergency Medicine

## 2011-04-15 ENCOUNTER — Encounter: Payer: Self-pay | Admitting: Emergency Medicine

## 2011-04-15 DIAGNOSIS — J069 Acute upper respiratory infection, unspecified: Secondary | ICD-10-CM

## 2011-04-15 DIAGNOSIS — R05 Cough: Secondary | ICD-10-CM

## 2011-04-15 DIAGNOSIS — J209 Acute bronchitis, unspecified: Secondary | ICD-10-CM

## 2011-04-15 MED ORDER — AZITHROMYCIN 250 MG PO TABS: ORAL_TABLET | ORAL | Status: AC

## 2011-04-15 MED ORDER — HYDROCODONE-HOMATROPINE 5-1.5 MG/5ML PO SYRP: 5.0000 mL | ORAL_SOLUTION | Freq: Four times a day (QID) | ORAL | Status: DC | PRN

## 2011-04-15 NOTE — ED Notes (Signed)
Cough and congestion x 1 week 

## 2011-04-15 NOTE — ED Provider Notes (Signed)
History     CSN: [id number]  Arrival date & time 04/15/11  0802   First MD Initiated Contact with Patient 04/15/11 [phone]      Chief Complaint  Patient presents with  . Cough    (Consider location/radiation/quality/duration/timing/severity/associated sxs/prior treatment) HPI [name] is a 40 y.o. female who complains of onset of cold symptoms for 5days.   Prior to that she had influenza and Tamiflu. All of her flu symptoms have improved however, she is continuing to cough. No sore throat ++ cough No pleuritic pain No wheezing + nasal congestion + post-nasal drainage + sinus pain/pressure + chest congestion No itchy/red eyes No earache No hemoptysis No SOB No chills/sweats No fever No nausea No vomiting No abdominal pain No diarrhea No skin rashes No fatigue No myalgias No headache    Past Medical History  Diagnosis Date  . Kidney stones   . Asthma   . Depression   . Anxiety   . GERD (gastroesophageal reflux disease)   . Hypothyroidism   . Endometriosis   . History of sinus surgery 2010    MAXILLARY, ETHMOID, SPHENOID  . Obesity, Class III, BMI 40-49.9 (morbid obesity)     Past Surgical History  Procedure Date  . Knee surgery   . Nasal sinus surgery 2010  . Colposcopy 2007  . Tubal ligation     Family History  Problem Relation Age of Onset  . Cancer Father     lung  . Alcohol abuse Father   . Cancer Other   . Depression Mother   . Depression Sister   . Suicidality Brother   . Depression Brother   . Alcohol abuse Brother   . Drug abuse Brother   . Depression Sister     History  Substance Use Topics  . Smoking status: Never Smoker   . Smokeless tobacco: Not on file  . Alcohol Use: No    OB History    Grav Para Term Preterm Abortions TAB SAB Ect Mult Living                  Review of Systems  Allergies  Avelox; Biaxin; Clarithromycin; and Moxifloxacin  Home Medications   Current Outpatient Rx  Name Route Sig Dispense Refill   . AZITHROMYCIN 250 MG PO TABS  Use as directed 1 each 0  . CETIRIZINE HCL 10 MG PO CHEW Oral Chew 10 mg by mouth daily.      Marland Kitchen CLONAZEPAM 0.5 MG PO TABS Oral Take 1 tablet (0.5 mg total) by mouth 2 (two) times daily as needed. For anxiety and ptsd 60 tablet 0  . ESOMEPRAZOLE MAGNESIUM 40 MG PO CPDR Oral Take 1 capsule (40 mg total) by mouth daily. 90 capsule 3  . FLINTSTONES COMPLETE 60 MG PO CHEW Oral Chew 2 tablets by mouth daily.      Marland Kitchen FLUTICASONE-SALMETEROL 250-50 MCG/DOSE IN AEPB Inhalation Inhale 1 puff into the lungs 2 (two) times daily. 1 each 11  . GUAIFENESIN-CODEINE 100-10 MG/5ML PO SYRP  Take 5 to 10 ML by mouth at bedtime as needed for cough 120 mL 0  . HYDROCODONE-HOMATROPINE 5-1.5 MG/5ML PO SYRP Oral Take 5 mLs by mouth every 6 (six) hours as needed for cough. 90 mL 0  . IBUPROFEN 200 MG PO TABS Oral Take 400 mg by mouth every 6 (six) hours as needed. Headache and pain     . LEVOTHYROXINE SODIUM 125 MCG PO TABS Oral Take 1 tablet (125 mcg total) by mouth  daily. 30 tablet 11  . OSELTAMIVIR PHOSPHATE 75 MG PO CAPS Oral Take 1 capsule (75 mg total) by mouth every 12 (twelve) hours. 10 capsule 0  . SERTRALINE HCL 100 MG PO TABS  Start change in dose 1 1/2 tab daily 45 tablet 0    Just received refill. Do not fill today  . VENLAFAXINE HCL 37.5 MG PO CP24 Oral Take 1 capsule (37.5 mg total) by mouth daily. 30 capsule 0    BP 120/89  Pulse 88  Temp(Src) 98.6 F (37 C) (Oral)  Resp 16  Ht 5\' 4"  (1.626 m)  Wt 253 lb (114.76 kg)  BMI 43.43 kg/m2  SpO2 97%  LMP 03/17/2011  Physical Exam  Nursing note and vitals reviewed. Constitutional: She is oriented to person, place, and time. She appears well-developed and well-nourished.  HENT:  Head: Normocephalic and atraumatic.  Right Ear: Tympanic membrane, external ear and ear canal normal.  Left Ear: Tympanic membrane, external ear and ear canal normal.  Nose: Mucosal edema and rhinorrhea present.  Mouth/Throat: Posterior  oropharyngeal erythema present. No oropharyngeal exudate or posterior oropharyngeal edema.  Eyes: No scleral icterus.  Neck: Neck supple.  Cardiovascular: Regular rhythm and normal heart sounds.   Pulmonary/Chest: Effort normal and breath sounds normal. No respiratory distress.  Neurological: She is alert and oriented to person, place, and time.  Skin: Skin is warm and dry.  Psychiatric: She has a normal mood and affect. Her speech is normal.    ED Course  Procedures (including critical care time)  Labs Reviewed - No data to display No results found.   1. Acute bronchitis   2. Cough   3. Acute upper respiratory infections of unspecified site       MDM  1)  Take the prescribed antibiotic as instructed.  Last week. She likely had the flu, however, this time. She seem to have bronchitis. If not improving, I would suggest a chest x-ray. 2)  Use nasal saline solution (over the counter) at least 3 times a day. 3)  Use over the counter decongestants like Zyrtec-D every 12 hours as needed to help with congestion.  If you have hypertension, do not take medicines with sudafed.  4)  Can take tylenol every 6 hours or motrin every 8 hours for pain or fever. 5)  Follow up with your primary doctor if no improvement in 5-7 days, sooner if increasing pain, fever, or new symptoms.     Lily Kocher, MD 04/15/11 [phone]

## 2011-04-16 DIAGNOSIS — N8003 Adenomyosis of the uterus: Secondary | ICD-10-CM

## 2011-04-16 DIAGNOSIS — N800 Endometriosis of uterus: Secondary | ICD-10-CM

## 2011-04-16 HISTORY — DX: Adenomyosis of the uterus: N80.03

## 2011-04-16 HISTORY — DX: Endometriosis of uterus: N80.0

## 2011-04-22 ENCOUNTER — Encounter: Payer: Self-pay | Admitting: Internal Medicine

## 2011-04-22 ENCOUNTER — Ambulatory Visit (INDEPENDENT_AMBULATORY_CARE_PROVIDER_SITE_OTHER): Payer: 59 | Admitting: Internal Medicine

## 2011-04-22 VITALS — BP 122/82 | HR 80 | Temp 97.9°F | Resp 16 | Wt 255.8 lb

## 2011-04-22 DIAGNOSIS — J209 Acute bronchitis, unspecified: Secondary | ICD-10-CM | POA: Insufficient documentation

## 2011-04-22 MED ORDER — AMOXICILLIN-POT CLAVULANATE 500-125 MG PO TABS: 1.0000 | ORAL_TABLET | Freq: Three times a day (TID) | ORAL | Status: AC

## 2011-04-22 MED ORDER — HYDROCOD POLST-CPM POLST ER 10-8 MG PO CP12: 1.0000 | ORAL_CAPSULE | Freq: Two times a day (BID) | ORAL | Status: DC | PRN

## 2011-04-22 NOTE — Progress Notes (Signed)
  Subjective:    Patient ID: [name], female    DOB: June 13, 1970, 41 y.o.   MRN: [id number]  Cough This is a recurrent problem. The current episode started 1 to 4 weeks ago. The problem has been gradually worsening. The problem occurs every few hours. The cough is productive of purulent sputum. Associated symptoms include chills. Pertinent negatives include no chest pain, ear congestion, ear pain, fever, headaches, heartburn, hemoptysis, myalgias, nasal congestion, postnasal drip, rash, rhinorrhea, sore throat, shortness of breath, sweats, weight loss or wheezing. The symptoms are aggravated by nothing. Treatments tried: zpak. The treatment provided mild relief. Her past medical history is significant for asthma.      Review of Systems  Constitutional: Positive for chills. Negative for fever, weight loss, diaphoresis, activity change, appetite change, fatigue and unexpected weight change.  HENT: Negative for ear pain, congestion, sore throat, facial swelling, rhinorrhea, sneezing, trouble swallowing, neck pain, neck stiffness, voice change, postnasal drip and sinus pressure.   Eyes: Negative.   Respiratory: Positive for cough. Negative for apnea, hemoptysis, choking, chest tightness, shortness of breath, wheezing and stridor.   Cardiovascular: Negative for chest pain.  Gastrointestinal: Negative for heartburn, nausea, vomiting, abdominal pain, diarrhea and constipation.  Genitourinary: Negative.   Musculoskeletal: Negative for myalgias, back pain, joint swelling, arthralgias and gait problem.  Skin: Negative for color change, pallor, rash and wound.  Neurological: Negative for dizziness, tremors, seizures, syncope, facial asymmetry, speech difficulty, weakness, light-headedness, numbness and headaches.  Hematological: Negative for adenopathy. Does not bruise/bleed easily.  Psychiatric/Behavioral: Negative.        Objective:   Physical Exam  Vitals reviewed. Constitutional: She  is oriented to person, place, and time. She appears well-developed and well-nourished. No distress.  HENT:  Head: Normocephalic and atraumatic.  Mouth/Throat: Oropharynx is clear and moist. No oropharyngeal exudate.  Eyes: Conjunctivae are normal. Right eye exhibits no discharge. Left eye exhibits no discharge. No scleral icterus.  Neck: Normal range of motion. Neck supple. No JVD present. No tracheal deviation present. No thyromegaly present.  Cardiovascular: Normal rate, regular rhythm, normal heart sounds and intact distal pulses.  Exam reveals no gallop and no friction rub.   No murmur heard. Pulmonary/Chest: Effort normal and breath sounds normal. No stridor. No respiratory distress. She has no wheezes. She has no rales. She exhibits no tenderness.  Abdominal: Soft. Bowel sounds are normal. She exhibits no distension and no mass. There is no tenderness. There is no rebound and no guarding.  Musculoskeletal: Normal range of motion. She exhibits no edema and no tenderness.  Lymphadenopathy:    She has no cervical adenopathy.  Neurological: She is oriented to person, place, and time.  Skin: Skin is warm and dry. No rash noted. She is not diaphoretic. No erythema. No pallor.  Psychiatric: She has a normal mood and affect. Her behavior is normal. Judgment and thought content normal.          Assessment & Plan:

## 2011-04-22 NOTE — Assessment & Plan Note (Signed)
Since the zpak did not adequately treat the infection I will try augmentin and tussicaps for cough suppression

## 2011-04-22 NOTE — Patient Instructions (Signed)

## 2011-05-13 ENCOUNTER — Encounter (HOSPITAL_COMMUNITY): Payer: Self-pay | Admitting: Psychiatry

## 2011-05-13 ENCOUNTER — Ambulatory Visit (INDEPENDENT_AMBULATORY_CARE_PROVIDER_SITE_OTHER): Payer: 59 | Admitting: Psychiatry

## 2011-05-13 DIAGNOSIS — F411 Generalized anxiety disorder: Secondary | ICD-10-CM

## 2011-05-13 DIAGNOSIS — F419 Anxiety disorder, unspecified: Secondary | ICD-10-CM

## 2011-05-13 DIAGNOSIS — F329 Major depressive disorder, single episode, unspecified: Secondary | ICD-10-CM

## 2011-05-13 MED ORDER — CLONAZEPAM 0.5 MG PO TABS: 0.5000 mg | ORAL_TABLET | Freq: Two times a day (BID) | ORAL | Status: DC | PRN

## 2011-05-13 MED ORDER — SERTRALINE HCL 100 MG PO TABS: ORAL_TABLET | ORAL | Status: DC

## 2011-05-13 MED ORDER — VENLAFAXINE HCL ER 37.5 MG PO CP24: 37.5000 mg | ORAL_CAPSULE | Freq: Every day | ORAL | Status: DC

## 2011-05-13 NOTE — Progress Notes (Signed)
Patient ID: [name], female   DOB: 1970/10/01, 41 y.o.   MRN: [id number] Dajha has been suffering from a cold which has circulated to the family. She works nights shift at at [employer]. She says her mood has been stable and medication is fine without any adverse effect. She denies suicidal and homicidal thoughts. She agrees to continue on her regimen that has given her stability. Of note her blood pressure is normal but he concern is raised regarding her weight. This is not addressed at this time but anticipate some discussion about nutrition and exercise in the near future.

## 2011-05-13 NOTE — Patient Instructions (Signed)
You have been given prescriptions for your Zoloft, Effexor and Klonopin.  refills and you may return in 2 months. He will meet meet a new psychiatrist in March. Even if denied suicidal homicidal thoughts. 3 please remember if you have any extremes distress or suicidal thoughts we went you to call 911 call the 1 800 hotlin-number work go to the [employer] [phone].

## 2011-05-15 ENCOUNTER — Emergency Department
Admission: EM | Admit: 2011-05-15 | Discharge: 2011-05-15 | Disposition: A | Discharge status: Home / Self Care | Payer: 59 | Source: Home / Self Care | Attending: Emergency Medicine | Admitting: Emergency Medicine

## 2011-05-15 ENCOUNTER — Encounter: Payer: Self-pay | Admitting: *Deleted

## 2011-05-15 DIAGNOSIS — R05 Cough: Secondary | ICD-10-CM

## 2011-05-15 DIAGNOSIS — J069 Acute upper respiratory infection, unspecified: Secondary | ICD-10-CM

## 2011-05-15 MED ORDER — AMOXICILLIN-POT CLAVULANATE 875-125 MG PO TABS: 1.0000 | ORAL_TABLET | Freq: Two times a day (BID) | ORAL | Status: AC

## 2011-05-15 MED ORDER — HYDROCODONE-HOMATROPINE 5-1.5 MG/5ML PO SYRP: 5.0000 mL | ORAL_SOLUTION | Freq: Three times a day (TID) | ORAL | Status: DC | PRN

## 2011-05-15 NOTE — ED Provider Notes (Signed)
History     CSN: [id number]  Arrival date & time 05/15/11  1236   First MD Initiated Contact with Patient 05/15/11 1313      Chief Complaint  Patient presents with  . Cough  . Nasal Congestion  . Otalgia    (Consider location/radiation/quality/duration/timing/severity/associated sxs/prior treatment) HPI [name] is a 41 y.o. female who complains of onset of cold symptoms for 10 days. She got over the flu and felt better for about 3 days and then gets sick again with upper respiratory symptoms.  No sore throat + cough No pleuritic pain No wheezing + nasal congestion + post-nasal drainage + sinus pain/pressure + chest congestion No itchy/red eyes No earache No hemoptysis No SOB No chills/sweats No fever No nausea No vomiting No abdominal pain No diarrhea No skin rashes + fatigue No myalgias + headache    Past Medical History  Diagnosis Date  . Kidney stones   . Asthma   . Depression   . Anxiety   . GERD (gastroesophageal reflux disease)   . Hypothyroidism   . Endometriosis   . History of sinus surgery 2010    MAXILLARY, ETHMOID, SPHENOID  . Obesity, Class III, BMI 40-49.9 (morbid obesity)     Past Surgical History  Procedure Date  . Knee surgery   . Nasal sinus surgery 2010  . Colposcopy 2007  . Tubal ligation     Family History  Problem Relation Age of Onset  . Cancer Father     lung  . Alcohol abuse Father   . Cancer Other   . Depression Mother   . Depression Sister   . Suicidality Brother   . Depression Brother   . Alcohol abuse Brother   . Drug abuse Brother   . Depression Sister     History  Substance Use Topics  . Smoking status: Never Smoker   . Smokeless tobacco: Not on file  . Alcohol Use: No    OB History    Grav Para Term Preterm Abortions TAB SAB Ect Mult Living                  Review of Systems  Allergies  Avelox; Biaxin; Clarithromycin; and Moxifloxacin  Home Medications   Current Outpatient Rx  Name  Route Sig Dispense Refill  . CETIRIZINE HCL 10 MG PO CHEW Oral Chew 10 mg by mouth daily.      Marland Kitchen CLONAZEPAM 0.5 MG PO TABS Oral Take 1 tablet (0.5 mg total) by mouth 2 (two) times daily as needed. For anxiety and ptsd 60 tablet 1  . ESOMEPRAZOLE MAGNESIUM 40 MG PO CPDR Oral Take 1 capsule (40 mg total) by mouth daily. 90 capsule 3  . FLINTSTONES COMPLETE 60 MG PO CHEW Oral Chew 2 tablets by mouth daily.      Marland Kitchen FLUTICASONE-SALMETEROL 250-50 MCG/DOSE IN AEPB Inhalation Inhale 1 puff into the lungs 2 (two) times daily. 1 each 11  . HYDROCOD POLST-CPM POLST ER 10-8 MG PO CP12 Oral Take 1 tablet by mouth 2 (two) times daily as needed. 25 each 1  . IBUPROFEN 200 MG PO TABS Oral Take 400 mg by mouth every 6 (six) hours as needed. Headache and pain     . LEVOTHYROXINE SODIUM 125 MCG PO TABS Oral Take 1 tablet (125 mcg total) by mouth daily. 30 tablet 11  . SERTRALINE HCL 100 MG PO TABS  Start change in dose 1 1/2 tab daily 45 tablet 1    Just received refill.  Do not fill today  . VENLAFAXINE HCL ER 37.5 MG PO CP24 Oral Take 1 capsule (37.5 mg total) by mouth daily. 30 capsule 1    BP 159/111  Temp(Src) 97.7 F (36.5 C) (Oral)  Resp 18  Ht 5\' 4"  (1.626 m)  Wt 263 lb 8 oz (119.523 kg)  BMI 45.23 kg/m2  SpO2 97%  LMP 05/08/2011  Physical Exam  Nursing note and vitals reviewed. Constitutional: She is oriented to person, place, and time. She appears well-developed and well-nourished.  HENT:  Head: Normocephalic and atraumatic.  Right Ear: Tympanic membrane, external ear and ear canal normal.  Left Ear: Tympanic membrane, external ear and ear canal normal.  Nose: Mucosal edema and rhinorrhea present.  Mouth/Throat: Posterior oropharyngeal erythema present. No oropharyngeal exudate or posterior oropharyngeal edema.  Eyes: No scleral icterus.  Neck: Neck supple.  Cardiovascular: Regular rhythm and normal heart sounds.   Pulmonary/Chest: Effort normal and breath sounds normal. No respiratory  distress.  Neurological: She is alert and oriented to person, place, and time.  Skin: Skin is warm and dry.  Psychiatric: She has a normal mood and affect. Her speech is normal.    ED Course  Procedures (including critical care time)  Labs Reviewed - No data to display No results found.   No diagnosis found.    MDM  1)  Take the prescribed antibiotic as instructed. 2)  Use nasal saline solution (over the counter) at least 3 times a day. 3)  Use over the counter decongestants like Zyrtec-D every 12 hours as needed to help with congestion.  If you have hypertension, do not take medicines with sudafed.  4)  Can take tylenol every 6 hours or motrin every 8 hours for pain or fever. 5)  Follow up with your primary doctor if no improvement in 5-7 days, sooner if increasing pain, fever, or new symptoms.     Lily Kocher, MD 05/15/11 1314

## 2011-05-15 NOTE — ED Notes (Signed)
Pt c/o LT ear ache, positional vertigo, productive cough, and nasal congestion x 10 days. Denies fever. She has taken dayquil, Robt, Delsym and Mucinex.

## 2011-05-19 ENCOUNTER — Encounter (HOSPITAL_COMMUNITY): Payer: Self-pay | Admitting: Pharmacist

## 2011-05-22 ENCOUNTER — Encounter (HOSPITAL_BASED_OUTPATIENT_CLINIC_OR_DEPARTMENT_OTHER): Payer: Self-pay | Admitting: Emergency Medicine

## 2011-05-22 ENCOUNTER — Emergency Department (HOSPITAL_BASED_OUTPATIENT_CLINIC_OR_DEPARTMENT_OTHER)
Admission: EM | Admit: 2011-05-22 | Discharge: 2011-05-22 | Disposition: A | Discharge status: Home / Self Care | Payer: 59 | Attending: Emergency Medicine | Admitting: Emergency Medicine

## 2011-05-22 DIAGNOSIS — R55 Syncope and collapse: Secondary | ICD-10-CM | POA: Insufficient documentation

## 2011-05-22 DIAGNOSIS — R109 Unspecified abdominal pain: Secondary | ICD-10-CM | POA: Insufficient documentation

## 2011-05-22 DIAGNOSIS — R112 Nausea with vomiting, unspecified: Secondary | ICD-10-CM | POA: Insufficient documentation

## 2011-05-22 DIAGNOSIS — Z79899 Other long term (current) drug therapy: Secondary | ICD-10-CM | POA: Insufficient documentation

## 2011-05-22 LAB — URINALYSIS, ROUTINE W REFLEX MICROSCOPIC
Bilirubin Urine: NEGATIVE
Glucose, UA: NEGATIVE mg/dL
Hgb urine dipstick: NEGATIVE
Ketones, ur: NEGATIVE mg/dL
Protein, ur: NEGATIVE mg/dL
Urobilinogen, UA: 0.2 mg/dL (ref 0.0–1.0)

## 2011-05-22 LAB — PREGNANCY, URINE: Preg Test, Ur: NEGATIVE

## 2011-05-22 MED ORDER — SODIUM CHLORIDE 0.9 % IV BOLUS (SEPSIS)
1000.0000 mL | Freq: Once | INTRAVENOUS | Status: AC
  Administered 2011-05-22: 1000 mL via INTRAVENOUS

## 2011-05-22 MED ORDER — NAPROXEN 500 MG PO TABS: 500.0000 mg | ORAL_TABLET | Freq: Two times a day (BID) | ORAL | Status: DC

## 2011-05-22 MED ORDER — MORPHINE SULFATE 4 MG/ML IJ SOLN
4.0000 mg | Freq: Once | INTRAMUSCULAR | Status: AC
  Administered 2011-05-22: 4 mg via INTRAVENOUS
  Filled 2011-05-22: qty 1

## 2011-05-22 MED ORDER — OXYCODONE-ACETAMINOPHEN 5-325 MG PO TABS: 2.0000 | ORAL_TABLET | Freq: Four times a day (QID) | ORAL | Status: AC | PRN

## 2011-05-22 MED ORDER — PROMETHAZINE HCL 25 MG/ML IJ SOLN
12.5000 mg | Freq: Once | INTRAMUSCULAR | Status: AC
  Administered 2011-05-22: 25 mg via INTRAVENOUS
  Filled 2011-05-22: qty 1

## 2011-05-22 MED ORDER — MORPHINE SULFATE 4 MG/ML IJ SOLN
8.0000 mg | Freq: Once | INTRAMUSCULAR | Status: AC
  Administered 2011-05-22: 8 mg via INTRAVENOUS
  Filled 2011-05-22: qty 2

## 2011-05-22 MED ORDER — ONDANSETRON HCL 4 MG/2ML IJ SOLN
4.0000 mg | Freq: Once | INTRAMUSCULAR | Status: AC
  Administered 2011-05-22: 4 mg via INTRAVENOUS
  Filled 2011-05-22: qty 2

## 2011-05-22 NOTE — ED Notes (Signed)
Pt continues to report pain, and states nausea is unchanged since meds were given. Will notify MD

## 2011-05-22 NOTE — ED Notes (Signed)
Pt started vomiting at 1700, blacked out momentarily.  No head injury, no LOC.  Continues to have right lower abdominal pain due to endometriosis, but continues to have N/V.  No known fever.

## 2011-05-22 NOTE — ED Provider Notes (Signed)
History     CSN: [id number]  Arrival date & time 05/22/11  4098   First MD Initiated Contact with Patient 05/22/11 1958      Chief Complaint  Patient presents with  . Nausea  . Emesis  . Loss of Consciousness    (Consider location/radiation/quality/duration/timing/severity/associated sxs/prior treatment) HPI Comments: 41 year old female with a history of endometriosis, has had scheduled surgery for endometriosis in approximately one week. She states that she gets intermittent pain in her right lower cautery at over the last 24 hours. This is sharp and stabbing, worse with palpation, associated with nausea and vomiting. Symptoms are severe at times and mild times. She states this is similar to her endometriosis pain in the past. She has taken one tablet of tramadol today without any improvement. She has not had anything to eat or drink for 9 hour.  She denies urinary symptoms, hematuria, dysuria, vaginal bleeding or discharge. She has no change in her bowel habits  Patient is a 41 y.o. female presenting with vomiting and syncope. The history is provided by the patient.  Emesis   Loss of Consciousness    Past Medical History  Diagnosis Date  . Kidney stones   . Asthma   . Depression   . Anxiety   . GERD (gastroesophageal reflux disease)   . Hypothyroidism   . Endometriosis   . History of sinus surgery 2010    MAXILLARY, ETHMOID, SPHENOID  . Obesity, Class III, BMI 40-49.9 (morbid obesity)     Past Surgical History  Procedure Date  . Knee surgery   . Nasal sinus surgery 2010  . Colposcopy 2007    Family History  Problem Relation Age of Onset  . Cancer Father     lung  . Alcohol abuse Father   . Cancer Other   . Depression Mother   . Depression Sister   . Suicidality Brother   . Depression Brother   . Alcohol abuse Brother   . Drug abuse Brother   . Depression Sister     History  Substance Use Topics  . Smoking status: Never Smoker   . Smokeless tobacco:  Not on file  . Alcohol Use: No    OB History    Grav Para Term Preterm Abortions TAB SAB Ect Mult Living                  Review of Systems  Cardiovascular: Positive for syncope.  Gastrointestinal: Positive for vomiting.  All other systems reviewed and are negative.    Allergies  Clarithromycin and Moxifloxacin  Home Medications   Current Outpatient Rx  Name Route Sig Dispense Refill  . AMOXICILLIN-POT CLAVULANATE 875-125 MG PO TABS Oral Take 1 tablet by mouth 2 (two) times daily. 16 tablet 0  . CLONAZEPAM 0.5 MG PO TABS Oral Take 1 tablet (0.5 mg total) by mouth 2 (two) times daily as needed. For anxiety and ptsd 60 tablet 1  . ESOMEPRAZOLE MAGNESIUM 40 MG PO CPDR Oral Take 1 capsule (40 mg total) by mouth daily. 90 capsule 3  . FLINTSTONES COMPLETE 60 MG PO CHEW Oral Chew 2 tablets by mouth daily.      Marland Kitchen FLUTICASONE-SALMETEROL 250-50 MCG/DOSE IN AEPB Inhalation Inhale 1 puff into the lungs 2 (two) times daily. 1 each 11  . LEVOTHYROXINE SODIUM 125 MCG PO TABS Oral Take 1 tablet (125 mcg total) by mouth daily. 30 tablet 11  . SERTRALINE HCL 100 MG PO TABS Oral Take 150 mg by mouth  daily.    Marland Kitchen NAPROXEN 500 MG PO TABS Oral Take 1 tablet (500 mg total) by mouth 2 (two) times daily with a meal. 30 tablet 0  . OXYCODONE-ACETAMINOPHEN 5-325 MG PO TABS Oral Take 2 tablets by mouth every 6 (six) hours as needed for pain. May take 2 tablets PO q 6 hours for severe pain - Do not take with Tylenol as this tablet already contains tylenol 30 tablet 0    BP 121/82  Pulse 81  Temp(Src) 98.9 F (37.2 C) (Oral)  Resp 16  SpO2 99%  LMP 05/08/2011  Physical Exam  Nursing note and vitals reviewed. Constitutional: She appears well-developed and well-nourished. No distress.  HENT:  Head: Normocephalic and atraumatic.  Mouth/Throat: Oropharynx is clear and moist. No oropharyngeal exudate.  Eyes: Conjunctivae and EOM are normal. Pupils are equal, round, and reactive to light. Right eye  exhibits no discharge. Left eye exhibits no discharge. No scleral icterus.  Neck: Normal range of motion. Neck supple. No JVD present. No thyromegaly present.  Cardiovascular: Normal rate, regular rhythm, normal heart sounds and intact distal pulses.  Exam reveals no gallop and no friction rub.   No murmur heard. Pulmonary/Chest: Effort normal and breath sounds normal. No respiratory distress. She has no wheezes. She has no rales.  Abdominal: Soft. Bowel sounds are normal. She exhibits no distension and no mass. There is tenderness (focal tenderness in the right lower quadrant There is no tenderness on the left, right upper quadrant or suprapubic area).  Musculoskeletal: Normal range of motion. She exhibits no edema and no tenderness.  Lymphadenopathy:    She has no cervical adenopathy.  Neurological: She is alert. Coordination normal.  Skin: Skin is warm and dry. No rash noted. No erythema.  Psychiatric: She has a normal mood and affect. Her behavior is normal.    ED Course  Procedures (including critical care time)   Labs Reviewed  URINALYSIS, ROUTINE W REFLEX MICROSCOPIC  PREGNANCY, URINE   No results found.   1. Abdominal pain       MDM  Pain medication, urinalysis to rule out other sources. The intermittent and colicky nature of this pain makes it much less likely to be appendicitis and given similarities to her prior endometriosis suspect this is a primary diagnoses. Pain medication including intravenous morphine and Zofran with IV fluid bolus given.  Patient has improved significantly with pain medication and is requesting discharge. Will give discharge prescription for Naprosyn and Percocet. Patient is amenable to followup return for severe or worsening symptoms. Urinalysis reveals no ketones no pregnancy no infection.      Vida Roller, MD 05/22/11 2240

## 2011-05-22 NOTE — ED Notes (Signed)
Pt reports pain has reduced to a tolerable level of 4/10. States the nausea has completely resolved.

## 2011-05-22 NOTE — ED Notes (Signed)
Pt reports sudden onset of RLQ pain earlier today. Hx of endometriosis, and is scheduled for surgery next Thursday. Reports severe pain with N/V, approx 4 episodes. Hx of similar episodes.

## 2011-05-23 ENCOUNTER — Inpatient Hospital Stay (HOSPITAL_COMMUNITY): Admission: RE | Admit: 2011-05-23 | Payer: Self-pay | Source: Ambulatory Visit

## 2011-05-24 ENCOUNTER — Encounter (HOSPITAL_COMMUNITY): Payer: Self-pay

## 2011-05-24 ENCOUNTER — Encounter (HOSPITAL_COMMUNITY)
Admission: RE | Admit: 2011-05-24 | Discharge: 2011-05-24 | Disposition: A | Discharge status: Home / Self Care | Payer: 59 | Source: Ambulatory Visit | Attending: Obstetrics and Gynecology | Admitting: Obstetrics and Gynecology

## 2011-05-24 LAB — CBC
MCH: 27 pg (ref 26.0–34.0)
MCHC: 31.9 g/dL (ref 30.0–36.0)
Platelets: 254 10*3/uL (ref 150–400)
RDW: 14 % (ref 11.5–15.5)

## 2011-05-24 LAB — SURGICAL PCR SCREEN
MRSA, PCR: POSITIVE — AB
Staphylococcus aureus: POSITIVE — AB

## 2011-05-24 NOTE — H&P (Signed)
  Patient name [name], [name] DICTATION#  [id number] CSN# [id number]  Juluis Mire, MD 05/24/2011 1:58 PM

## 2011-05-24 NOTE — Patient Instructions (Addendum)
   Your procedure is scheduled on: Thursday, Feb 14th  Enter through the [employer] of [facility] at: [employer] up the phone at the desk and dial [phone] and inform us of your arrival.  Please call this number if you have any problems the morning of surgery: [phone]  Remember: Do not eat food after midnight: WED Do not drink clear liquids after: WED Take these medicines the morning of surgery with a SIP OF WATER: per anesthesia:  Nexium   Do not wear jewelry, make-up, or FINGER nail polish Do not wear lotions, powders, perfumes or deodorant. Do not shave 48 hours prior to surgery. Do not bring valuables to the hospital.  Patients discharged on the day of surgery will not be allowed to drive home.    Remember to use your hibiclens as instructed.Please shower with 1/2 bottle the evening before your surgery and the other 1/2 bottle the morning of surgery.

## 2011-05-26 NOTE — H&P (Signed)
[name], [name]           ACCOUNT NO.:  [id number]  MEDICAL RECORD NO.:  [id number]  LOCATION:                                 FACILITY:  PHYSICIAN:  Juluis Mire, M.D.   DATE OF BIRTH:  02-27-1971  DATE OF ADMISSION: DATE OF DISCHARGE:                             HISTORY & PHYSICAL   DATE OF SURGERY:  May 30, 2011 at [facility].  HISTORY OF PRESENT ILLNESS:  The patient is a 41 year old, gravida 3, para 3 female, presents for diagnostic laparoscopy.  The patient has a past history of endometriosis that was treated laparoscopically in 1997.  She has been having trouble with increasing pain and discomfort in the form of significant dysmenorrhea.  She had been on the NuvaRing which she discontinued and over-the-counter agents are not responding.  She is also having pain with intercourse, particularly during deep penetration.  We did an ultrasound that was basically unremarkable except for findings of adenomyosis.  We had discussed options with her including hormonal suppression versus laparoscopy versus hysterectomy.  She is going to proceed with the laparoscopy at the present time.  ALLERGIES:  She is allergic to [facility] and AVELOX.  MEDICATIONS: 1. Sertraline 100 mg at bedtime. 2. Nexium 40 mg a day. 3. Symbicort 2 puffs b.i.d. 4. Levothyroxine 75 mcg a day. 5. Klonopin 0.5 mg as needed.  PAST MEDICAL HISTORY:  She has had a history of asthmatic bronchitis as well as migraine headaches.  She has had a history of abnormal cervical cytology, treated with cryotherapy in the past.  PAST SURGICAL HISTORY:  Laparoscopy for endometriosis in 1997, cystoscopy in 1993, sinus surgery in 2010, and knee surgery in 2007.  OBSTETRICAL HISTORY:  She has had 3 vaginal deliveries.  SOCIAL HISTORY:  No tobacco or alcohol use.  FAMILY HISTORY:  Significant for a family history of colon cancer.  REVIEW OF SYSTEMS:  Noncontributory.  PHYSICAL EXAMINATION:  VITAL  SIGNS:  The patient is afebrile with stable vital signs. HEENT:  The patient is normocephalic.  Pupils are equal, round, and reactive to light and accommodation.  Extraocular movements are intact. Sclerae and conjunctivae are clear.  Oropharynx is clear. NECK:  Not examined. BREASTS:  Not examined. LUNGS:  Clear. CARDIOVASCULAR:  Regular rhythm and rate.  There are no murmurs or gallops. ABDOMEN:  Benign.  No mass, organomegaly, or tenderness. PELVIC:  Normal external genitalia.  Vaginal mucosa is clear.  Cervix is unremarkable.  Uterus is in upper limits of normal size, mildly tender. Adnexa unremarkable. EXTREMITIES:  Trace edema. NEUROLOGIC:  Grossly within normal limits.  IMPRESSION:  Chronic pelvic pain, possible recurrent endometriosis.  PLAN OF MANAGEMENT:  The patient will undergo laparoscopic evaluation. The risks of surgery have been discussed including the risk of infection.  The risk of hemorrhage that could require transfusion with the risk of AIDS or hepatitis.  Excessive bleeding could require hysterectomy.  There is a risk of injury to adjacent organs including bladder, bowel, or ureters that could require further exploratory surgery.  Risk of deep venous thrombosis and pulmonary embolus.  Success rates of 60% for treatment of endometriosis for relief of pain described.  Possible recurrence explained and other alternatives  are explained.     Juluis Mire, M.D.     JSM/MEDQ  D:  05/24/2011  T:  05/25/2011  Job:  [id number]

## 2011-05-30 ENCOUNTER — Ambulatory Visit (HOSPITAL_COMMUNITY): Payer: 59 | Admitting: Anesthesiology

## 2011-05-30 ENCOUNTER — Encounter (HOSPITAL_COMMUNITY): Payer: Self-pay | Admitting: *Deleted

## 2011-05-30 ENCOUNTER — Encounter (HOSPITAL_COMMUNITY)
Admission: RE | Disposition: A | Discharge status: Home / Self Care | Payer: Self-pay | Source: Ambulatory Visit | Attending: Obstetrics and Gynecology

## 2011-05-30 ENCOUNTER — Ambulatory Visit (HOSPITAL_COMMUNITY)
Admission: RE | Admit: 2011-05-30 | Discharge: 2011-05-30 | Disposition: A | Discharge status: Home / Self Care | Payer: 59 | Source: Ambulatory Visit | Attending: Obstetrics and Gynecology | Admitting: Obstetrics and Gynecology

## 2011-05-30 ENCOUNTER — Encounter (HOSPITAL_COMMUNITY): Payer: Self-pay | Admitting: Anesthesiology

## 2011-05-30 DIAGNOSIS — N946 Dysmenorrhea, unspecified: Secondary | ICD-10-CM | POA: Insufficient documentation

## 2011-05-30 DIAGNOSIS — F32A Depression, unspecified: Secondary | ICD-10-CM

## 2011-05-30 DIAGNOSIS — N803 Endometriosis of pelvic peritoneum, unspecified: Secondary | ICD-10-CM | POA: Insufficient documentation

## 2011-05-30 DIAGNOSIS — J209 Acute bronchitis, unspecified: Secondary | ICD-10-CM

## 2011-05-30 DIAGNOSIS — F329 Major depressive disorder, single episode, unspecified: Secondary | ICD-10-CM

## 2011-05-30 DIAGNOSIS — F410 Panic disorder [episodic paroxysmal anxiety] without agoraphobia: Secondary | ICD-10-CM

## 2011-05-30 DIAGNOSIS — E039 Hypothyroidism, unspecified: Secondary | ICD-10-CM

## 2011-05-30 DIAGNOSIS — R102 Pelvic and perineal pain: Secondary | ICD-10-CM

## 2011-05-30 DIAGNOSIS — N949 Unspecified condition associated with female genital organs and menstrual cycle: Secondary | ICD-10-CM | POA: Insufficient documentation

## 2011-05-30 DIAGNOSIS — K219 Gastro-esophageal reflux disease without esophagitis: Secondary | ICD-10-CM

## 2011-05-30 HISTORY — PX: LAPAROSCOPY: SHX197

## 2011-05-30 SURGERY — LAPAROSCOPY OPERATIVE
Anesthesia: General | Site: Abdomen | Wound class: Clean Contaminated

## 2011-05-30 MED ORDER — DEXAMETHASONE SODIUM PHOSPHATE 4 MG/ML IJ SOLN
INTRAMUSCULAR | Status: DC | PRN
  Administered 2011-05-30: 10 mg via INTRAVENOUS

## 2011-05-30 MED ORDER — MIDAZOLAM HCL 5 MG/5ML IJ SOLN
INTRAMUSCULAR | Status: DC | PRN
  Administered 2011-05-30: 2 mg via INTRAVENOUS

## 2011-05-30 MED ORDER — FENTANYL CITRATE 0.05 MG/ML IJ SOLN
INTRAMUSCULAR | Status: AC
  Filled 2011-05-30: qty 5

## 2011-05-30 MED ORDER — NEOSTIGMINE METHYLSULFATE 1 MG/ML IJ SOLN
INTRAMUSCULAR | Status: DC | PRN
  Administered 2011-05-30: 4 mg via INTRAVENOUS

## 2011-05-30 MED ORDER — ROCURONIUM BROMIDE 100 MG/10ML IV SOLN
INTRAVENOUS | Status: DC | PRN
  Administered 2011-05-30: 40 mg via INTRAVENOUS

## 2011-05-30 MED ORDER — PROPOFOL 10 MG/ML IV EMUL
INTRAVENOUS | Status: AC
  Filled 2011-05-30: qty 20

## 2011-05-30 MED ORDER — METOCLOPRAMIDE HCL 5 MG/ML IJ SOLN
10.0000 mg | Freq: Once | INTRAMUSCULAR | Status: AC
  Administered 2011-05-30: 10 mg via INTRAVENOUS

## 2011-05-30 MED ORDER — FENTANYL CITRATE 0.05 MG/ML IJ SOLN
25.0000 ug | INTRAMUSCULAR | Status: DC | PRN
  Administered 2011-05-30 (×2): 50 ug via INTRAVENOUS

## 2011-05-30 MED ORDER — FENTANYL CITRATE 0.05 MG/ML IJ SOLN
INTRAMUSCULAR | Status: DC | PRN
  Administered 2011-05-30: 100 ug via INTRAVENOUS
  Administered 2011-05-30 (×2): 50 ug via INTRAVENOUS
  Administered 2011-05-30 (×3): 100 ug via INTRAVENOUS

## 2011-05-30 MED ORDER — KETOROLAC TROMETHAMINE 30 MG/ML IJ SOLN
INTRAMUSCULAR | Status: DC | PRN
  Administered 2011-05-30: 30 mg via INTRAVENOUS

## 2011-05-30 MED ORDER — MIDAZOLAM HCL 2 MG/2ML IJ SOLN
INTRAMUSCULAR | Status: AC
  Filled 2011-05-30: qty 2

## 2011-05-30 MED ORDER — FENTANYL CITRATE 0.05 MG/ML IJ SOLN
INTRAMUSCULAR | Status: AC
  Administered 2011-05-30: 50 ug via INTRAVENOUS
  Filled 2011-05-30: qty 2

## 2011-05-30 MED ORDER — LACTATED RINGERS IV SOLN
INTRAVENOUS | Status: DC
  Administered 2011-05-30 (×3): via INTRAVENOUS

## 2011-05-30 MED ORDER — HYDROMORPHONE HCL PF 1 MG/ML IJ SOLN
INTRAMUSCULAR | Status: AC
  Filled 2011-05-30: qty 1

## 2011-05-30 MED ORDER — HYDROMORPHONE HCL PF 1 MG/ML IJ SOLN
INTRAMUSCULAR | Status: DC | PRN
  Administered 2011-05-30: 1 mg via INTRAVENOUS

## 2011-05-30 MED ORDER — PROPOFOL 10 MG/ML IV EMUL
INTRAVENOUS | Status: DC | PRN
  Administered 2011-05-30: 200 mg via INTRAVENOUS

## 2011-05-30 MED ORDER — BUPIVACAINE HCL (PF) 0.25 % IJ SOLN
INTRAMUSCULAR | Status: AC
  Filled 2011-05-30: qty 30

## 2011-05-30 MED ORDER — LACTATED RINGERS IR SOLN
Status: DC | PRN
  Administered 2011-05-30: 3000 mL

## 2011-05-30 MED ORDER — BUPIVACAINE HCL (PF) 0.25 % IJ SOLN
INTRAMUSCULAR | Status: DC | PRN
  Administered 2011-05-30: 9 mL

## 2011-05-30 MED ORDER — GLYCOPYRROLATE 0.2 MG/ML IJ SOLN
INTRAMUSCULAR | Status: DC | PRN
  Administered 2011-05-30: .6 mg via INTRAVENOUS

## 2011-05-30 MED ORDER — ONDANSETRON HCL 4 MG/2ML IJ SOLN
INTRAMUSCULAR | Status: AC
  Filled 2011-05-30: qty 2

## 2011-05-30 MED ORDER — DEXAMETHASONE SODIUM PHOSPHATE 10 MG/ML IJ SOLN
INTRAMUSCULAR | Status: AC
  Filled 2011-05-30: qty 1

## 2011-05-30 MED ORDER — METOCLOPRAMIDE HCL 5 MG/ML IJ SOLN
INTRAMUSCULAR | Status: AC
  Administered 2011-05-30: 10 mg via INTRAVENOUS
  Filled 2011-05-30: qty 2

## 2011-05-30 MED ORDER — ONDANSETRON HCL 4 MG/2ML IJ SOLN
INTRAMUSCULAR | Status: DC | PRN
  Administered 2011-05-30: 4 mg via INTRAVENOUS

## 2011-05-30 MED ORDER — ROCURONIUM BROMIDE 50 MG/5ML IV SOLN
INTRAVENOUS | Status: AC
  Filled 2011-05-30: qty 1

## 2011-05-30 MED ORDER — LIDOCAINE HCL (CARDIAC) 20 MG/ML IV SOLN
INTRAVENOUS | Status: AC
  Filled 2011-05-30: qty 5

## 2011-05-30 MED ORDER — INDIGOTINDISULFONATE SODIUM 8 MG/ML IJ SOLN
INTRAMUSCULAR | Status: AC
  Filled 2011-05-30: qty 5

## 2011-05-30 MED ORDER — LIDOCAINE HCL (CARDIAC) 20 MG/ML IV SOLN
INTRAVENOUS | Status: DC | PRN
  Administered 2011-05-30: 40 mg via INTRAVENOUS

## 2011-05-30 SURGICAL SUPPLY — 29 items
CABLE HIGH FREQUENCY MONO STRZ (ELECTRODE) IMPLANT
CATH ROBINSON RED A/P 16FR (CATHETERS) IMPLANT
CLOTH BEACON ORANGE TIMEOUT ST (SAFETY) ×2 IMPLANT
DERMABOND ADVANCED (GAUZE/BANDAGES/DRESSINGS) ×1
DERMABOND ADVANCED .7 DNX12 (GAUZE/BANDAGES/DRESSINGS) ×1 IMPLANT
ELECT REM PT RETURN 9FT ADLT (ELECTROSURGICAL) ×2
ELECTRODE REM PT RTRN 9FT ADLT (ELECTROSURGICAL) ×1 IMPLANT
GLOVE BIO SURGEON STRL SZ7 (GLOVE) ×4 IMPLANT
GOWN PREVENTION PLUS XLARGE (GOWN DISPOSABLE) ×2 IMPLANT
GOWN STRL NON-REIN LRG LVL3 (GOWN DISPOSABLE) ×2 IMPLANT
NEEDLE INSUFFLATION 14GA 120MM (NEEDLE) IMPLANT
NS IRRIG 1000ML POUR BTL (IV SOLUTION) ×2 IMPLANT
PACK LAPAROSCOPY BASIN (CUSTOM PROCEDURE TRAY) ×2 IMPLANT
POUCH SPECIMEN RETRIEVAL 10MM (ENDOMECHANICALS) IMPLANT
PROTECTOR NERVE ULNAR (MISCELLANEOUS) ×2 IMPLANT
SCISSORS LAP 5X35 DISP (ENDOMECHANICALS) IMPLANT
SCISSORS LAP 5X45 EPIX DISP (ENDOMECHANICALS) IMPLANT
SEALER TISSUE G2 CVD JAW 45CM (ENDOMECHANICALS) IMPLANT
SET IRRIG TUBING LAPAROSCOPIC (IRRIGATION / IRRIGATOR) IMPLANT
SUT VIC AB 3-0 PS2 18 (SUTURE) ×1
SUT VIC AB 3-0 PS2 18XBRD (SUTURE) ×1 IMPLANT
SUT VICRYL 0 ENDOLOOP (SUTURE) IMPLANT
SUT VICRYL 0 UR6 27IN ABS (SUTURE) IMPLANT
TOWEL OR 17X24 6PK STRL BLUE (TOWEL DISPOSABLE) ×4 IMPLANT
TROCAR BALLN 12MMX100 BLUNT (TROCAR) IMPLANT
TROCAR Z-THREAD BLADED 11X100M (TROCAR) IMPLANT
TROCAR Z-THREAD BLADED 5X100MM (TROCAR) IMPLANT
WARMER LAPAROSCOPE (MISCELLANEOUS) ×2 IMPLANT
WATER STERILE IRR 1000ML POUR (IV SOLUTION) ×2 IMPLANT

## 2011-05-30 NOTE — Op Note (Signed)
Patient name  [name], [name] DICTATION#  [id number] CSN# [id number]  Juluis Mire, MD 05/30/2011 8:18 AM

## 2011-05-30 NOTE — Discharge Instructions (Signed)
Diagnostic Laparoscopy Laparoscopy is a surgical procedure. It is used to diagnose and treat diseases inside the belly(abdomen). It is usually a brief, common, and relatively simple procedure. The laparoscopeis a thin, lighted, pencil-sized instrument. It is like a telescope. It is inserted into your abdomen through a small cut (incision). Your caregiver can look at the organs inside your body through this instrument. He or she can see if there is anything abnormal. Laparoscopy can be done either in a hospital or outpatient clinic. You may be given a mild sedative to help you relax before the procedure. Once in the operating room, you will be given a drug to make you sleep (general anesthesia). Laparoscopy usually lasts less than 1 hour. After the procedure, you will be monitored in a recovery area until you are stable and doing well. Once you are home, it will take 2 to 3 days to fully recover. RISKS AND COMPLICATIONS  Laparoscopy has relatively few risks. Your caregiver will discuss the risks with you before the procedure. Some problems that can occur include:  Infection.   Bleeding.   Damage to other organs.   Anesthetic side effects.  PROCEDURE Once you receive anesthesia, your surgeon inflates the abdomen with a harmless gas (carbon dioxide). This makes the organs easier to see. The laparoscope is inserted into the abdomen through a small incision. This allows your surgeon to see into the abdomen. Other small instruments are also inserted into the abdomen through other small openings. Many surgeons attach a video camera to the laparoscope to enlarge the view. During a diagnostic laparoscopy, the surgeon may be looking for inflammation, infection, or cancer. Your surgeon may take tissue samples(biopsies). The samples are sent to a specialist in looking at cells and tissue samples (pathologist). The pathologist examines them under a microscope. Biopsies can help to diagnose or confirm a  disease. AFTER THE PROCEDURE   The gas is released from inside the abdomen.   The incisions are closed with stitches (sutures). Because these incisions are small (usually less than 1/2 inch), there is usually minimal discomfort after the procedure. There may be some mild discomfort in the throat. This is from the tube placed in the throat while you were sleeping. You may have some mild abdominal discomfort. There may also be discomfort from the instrument placement incisions in the abdomen.   The recovery time is shortened as long as there are no complications.   You will rest in a recovery room until stable and doing well. As long as there are no complications, you may be allowed to go home.  FINDING OUT THE RESULTS OF YOUR TEST Not all test results are available during your visit. If your test results are not back during the visit, make an appointment with your caregiver to find out the results. Do not assume everything is normal if you have not heard from your caregiver or the medical facility. It is important for you to follow up on all of your test results. HOME CARE INSTRUCTIONS   Take all medicines as directed.   Only take over-the-counter or prescription medicines for pain, discomfort, or fever as directed by your caregiver.   Resume daily activities as directed.   Showers are preferred over baths.   You may resume sexual activities in 1 week or as directed.   Do not drive while taking narcotics.  SEEK MEDICAL CARE IF:   There is increasing abdominal pain.   There is new pain in the shoulders (shoulder strap areas).     You feel lightheaded or faint.   You have the chills.   You or your child has an oral temperature above 102 F (38.9 C).   There is pus-like (purulent) drainage from any of the wounds.   You are unable to pass gas or have a bowel movement.   You feel sick to your stomach (nauseous) or throw up (vomit).  MAKE SURE YOU:   Understand these instructions.    Will watch your condition.   Will get help right away if you are not doing well or get worse.  Document Released: 07/08/2000 Document Revised: 12/12/2010 Document Reviewed: 04/01/2007 [facility] Patient Information 2012 [place], [place].DISCHARGE INSTRUCTIONS: Laparoscopy  The following instructions have been prepared to help you care for yourself upon your return home today.  Wound care: Marland Kitchen Do not get the incision wet for the first 24 hours. The incision should be kept clean and dry. . The Band-Aids or dressings may be removed the day after surgery. . Should the incision become sore, red, and swollen after the first week, check with your doctor.  Personal hygiene: . Shower the day after your procedure.  Activity and limitations: . Do NOT drive or operate any equipment today. . Do NOT lift anything more than 15 pounds for 2-3 weeks after surgery. . Do NOT rest in bed all day. . Walking is encouraged. Walk each day, starting slowly with 5-minute walks 3 or 4 times a day. Slowly increase the length of your walks. . Walk up and down stairs slowly. . Do NOT do strenuous activities, such as golfing, playing tennis, bowling, running, biking, weight lifting, gardening, mowing, or vacuuming for 2-4 weeks. Ask your doctor when it is okay to start.  Diet: Eat a light meal as desired this evening. You may resume your usual diet tomorrow.  Return to work: This is dependent on the type of work you do. For the most part you can return to a desk job within a week of surgery. If you are more active at work, please discuss this with your doctor.  What to expect after your surgery: You may have a slight burning sensation when you urinate on the first day. You may have a very small amount of blood in the urine. Expect to have a small amount of vaginal discharge/light bleeding for 1-2 weeks. It is not unusual to have abdominal soreness and bruising for up to 2 weeks. You may be tired and need more rest for  about 1 week. You may experience shoulder pain for 24-72 hours. Lying flat in bed may relieve it.  Call your doctor for any of the following: . Develop a fever of 100.4 or greater . Inability to urinate 6 hours after discharge from hospital . Severe pain not relieved by pain medications . Persistent of heavy bleeding at incision site . Redness or swelling around incision site after a week . Increasing nausea or vomiting  Patient Signature________________________________________ Nurse Signature_________________________________________

## 2011-05-30 NOTE — Brief Op Note (Signed)
05/30/2011  8:17 AM  PATIENT:  [name]  41 y.o. female  PRE-OPERATIVE DIAGNOSIS:  pelvic pain  POST-OPERATIVE DIAGNOSIS:  pelvic pain  PROCEDURE:  Procedure(s) (LRB): LAPAROSCOPY OPERATIVE (N/A) YAG LASER APPLICATION (N/A)  SURGEON:  Surgeon(s) and Role:    * Juluis Mire, MD - Primary  PHYSICIAN ASSISTANT:   ASSISTANTS: none   ANESTHESIA:   general  EBL:  Total I/O In: 1000 [I.V.:1000] Out: -   BLOOD ADMINISTERED:none  DRAINS: none   LOCAL MEDICATIONS USED:  MARCAINE     SPECIMEN:  No Specimen  DISPOSITION OF SPECIMEN:  N/A  COUNTS:  YES  TOURNIQUET:  * No tourniquets in log *  DICTATION: .Other Dictation: Dictation Number [id number]  PLAN OF CARE: Discharge to home after PACU  PATIENT DISPOSITION:  PACU - hemodynamically stable.   Delay start of Pharmacological VTE agent (>24hrs) due to surgical blood loss or risk of bleeding: no

## 2011-05-30 NOTE — Anesthesia Postprocedure Evaluation (Signed)
Anesthesia Post Note  Patient: [name]  Procedure(s) Performed: Procedure(s) (LRB): LAPAROSCOPY OPERATIVE (N/A) YAG LASER APPLICATION (N/A)  Anesthesia type: General  Patient location: PACU  Post pain: Pain level controlled  Post assessment: Post-op Vital signs reviewed  Last Vitals:  Filed Vitals:   05/30/11 0611  BP: 127/92  Pulse: 86  Temp: 36.7 C  Resp: 18    Post vital signs: Reviewed  Level of consciousness: sedated  Complications: No apparent anesthesia complicationsfj

## 2011-05-30 NOTE — Transfer of Care (Signed)
Immediate Anesthesia Transfer of Care Note  Patient: [name]  Procedure(s) Performed: Procedure(s) (LRB): LAPAROSCOPY OPERATIVE (N/A) YAG LASER APPLICATION (N/A)  Patient Location: PACU  Anesthesia Type: General  Level of Consciousness: awake, alert  and oriented  Airway & Oxygen Therapy: Patient Spontanous Breathing and Patient connected to nasal cannula oxygen  Post-op Assessment: Report given to PACU RN and Post -op Vital signs reviewed and stable  Post vital signs: Reviewed and stable  Complications: No apparent anesthesia complications

## 2011-05-30 NOTE — H&P (Signed)
History and physical exam unchangede

## 2011-05-30 NOTE — Anesthesia Preprocedure Evaluation (Signed)
Anesthesia Evaluation  Patient identified by MRN, date of birth, ID band Patient awake    Reviewed: Allergy & Precautions, H&P , NPO status , Patient's Chart, lab work & pertinent test results, reviewed documented beta blocker date and time   Airway Mallampati: I TM Distance: <3 FB Neck ROM: full    Dental  (+) Teeth Intact   Pulmonary asthma (daily advair, last used inhaler one week ago during a cold) , sleep apnea (undiagnosed, husband notes patient stops breathing in sleep) , Recent URI  (cold 3 weeks ago - afebrile x 3 weeks), Resolved,  clear to auscultation  Pulmonary exam normal       Cardiovascular Exercise Tolerance: Good regular Normal    Neuro/Psych PSYCHIATRIC DISORDERS (depression, anxiety) Negative Neurological ROS     GI/Hepatic Neg liver ROS, GERD-  Medicated and Controlled,  Endo/Other  Hypothyroidism Morbid obesity  Renal/GU negative Renal ROS  Genitourinary negative   Musculoskeletal   Abdominal   Peds  Hematology negative hematology ROS (+)   Anesthesia Other Findings   Reproductive/Obstetrics negative OB ROS                           Anesthesia Physical Anesthesia Plan  ASA: III  Anesthesia Plan: General ETT   Post-op Pain Management:    Induction:   Airway Management Planned:   Additional Equipment:   Intra-op Plan:   Post-operative Plan:   Informed Consent: I have reviewed the patients History and Physical, chart, labs and discussed the procedure including the risks, benefits and alternatives for the proposed anesthesia with the patient or authorized representative who has indicated his/her understanding and acceptance.   Dental Advisory Given  Plan Discussed with: CRNA and Surgeon  Anesthesia Plan Comments:         Anesthesia Quick Evaluation

## 2011-05-31 NOTE — Op Note (Signed)
[name], ROSANDER           ACCOUNT NO.:  [id number]  MEDICAL RECORD NO.:  [id number]  LOCATION:  [facility]                          FACILITY:  [facility]  PHYSICIAN:  Juluis Mire, M.D.   DATE OF BIRTH:  Aug 22, 1970  DATE OF PROCEDURE:  05/30/2011 DATE OF DISCHARGE:  05/30/2011                              OPERATIVE REPORT   PREOPERATIVE DIAGNOSIS:  Pelvic pain, possible recurrent endometriosis.  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  OPERATIVE PROCEDURE:  Open laparoscopy with laser ablation of endometriotic implants using the YAG laser.  SURGEON:  Juluis Mire, MD  ANESTHESIA:  General endotracheal.  ESTIMATED BLOOD LOSS:  Minimal.  PACKS AND DRAINS:  None.  INTRAOPERATIVE BLOOD REPLACEMENT:  None.  COMPLICATIONS:  None.  INDICATIONS:  Dictated in history and physical.  PROCEDURE:  The patient was taken to the OR, placed in supine position. After satisfactory level of general endotracheal anesthesia was obtained, the patient was placed in the dorsal lithotomy position using the Allen stirrups.  The abdomen, perineum, and vagina were prepped out with Betadine.  Bladder was emptied by in-and-out catheterization.  A Hulka tenaculum was put in place and secured.  A subumbilical incision was made with the knife and extended through the subcutaneous tissue. Fascia was entered sharply.  Incision was fashioned laterally.  The peritoneum was entered with blunt finger pressure.  The open laparoscopic trocar was put in place and secured.  Abdomen was insufflated with carbon dioxide.  Laparoscope was then introduced. There was no evidence of injury to adjacent organs.  A 5-mm trocar was put in place in the suprapubic area under direct visualization.  Uterus was elevated.  Visualization revealed superficial implants of endometriosis along the left and right pelvic sidewall.  Tubes and ovaries were unremarkable.  Uterus was enlarged consistent  with adenomyosis.  Upper abdomen including the liver tip of the gallbladder was cleared.  Could not visualized the appendix.  At this point in time, we used the YAG laser, we initially tried to flat tip, which did not work.  We went to a more chisel tip.  We went ahead and with a power setting of 15, ablated the areas of endometriosis along the left pelvic sidewall avoiding the area of ureter.  We also went on the right pelvic sidewall ablating the areas of endometriosis.  No endometriosis involved in the ovaries.  There were no adhesions, so she would have mild endometriosis by classification.  At the end of procedure, all areas of active endometriosis had been ablated using the laser.  There was no signs of injury to adjacent organs.  We irrigated the pelvis and had good hemostasis.  At this point in time, the abdomen was deflated with carbon dioxide.  All trocars were removed.  Subumbilical fascia was closed with figure-of-eight of 0 Vicryl.  Skin was closed with interrupted subcuticular of 4-0 Vicryl and Dermabond.  Suprapubic incision was closed with Dermabond.  The Hulka tenaculum was then removed.  The patient was taken out of the dorsal lithotomy position once alert and extubated, transferred to recovery room in good condition.  Sponge, instrument, and needle counts were reported as correct by circulating nurse x2.  Juluis Mire, M.D.     JSM/MEDQ  D:  05/30/2011  T:  05/31/2011  Job:  [id number]

## 2011-06-03 ENCOUNTER — Encounter (HOSPITAL_COMMUNITY): Payer: Self-pay | Admitting: Obstetrics and Gynecology

## 2011-06-04 ENCOUNTER — Inpatient Hospital Stay (HOSPITAL_COMMUNITY)
Admission: AD | Admit: 2011-06-04 | Discharge: 2011-06-05 | Disposition: A | Discharge status: Home Health | Payer: 59 | Source: Ambulatory Visit | Attending: Obstetrics and Gynecology | Admitting: Obstetrics and Gynecology

## 2011-06-04 DIAGNOSIS — G8918 Other acute postprocedural pain: Secondary | ICD-10-CM | POA: Insufficient documentation

## 2011-06-04 NOTE — Progress Notes (Signed)
Pt reports she had surg last week for endometriosis (laparoscopic). States has had pain off/on but tonight has worsened and is on right side  And radiating to mid lower abd. Denies dysuria, denies fever.

## 2011-06-05 ENCOUNTER — Encounter (HOSPITAL_COMMUNITY): Payer: Self-pay | Admitting: *Deleted

## 2011-06-05 LAB — CBC
Hemoglobin: 11 g/dL — ABNORMAL LOW (ref 12.0–15.0)
MCV: 83.3 fL (ref 78.0–100.0)
Platelets: 252 10*3/uL (ref 150–400)
RBC: 3.95 MIL/uL (ref 3.87–5.11)
WBC: 8.3 10*3/uL (ref 4.0–10.5)

## 2011-06-05 LAB — URINALYSIS, ROUTINE W REFLEX MICROSCOPIC
Glucose, UA: NEGATIVE mg/dL
Specific Gravity, Urine: 1.02 (ref 1.005–1.030)
Urobilinogen, UA: 0.2 mg/dL (ref 0.0–1.0)
pH: 6 (ref 5.0–8.0)

## 2011-06-05 LAB — URINE MICROSCOPIC-ADD ON

## 2011-06-05 MED ORDER — PROMETHAZINE HCL 25 MG PO TABS: 25.0000 mg | ORAL_TABLET | Freq: Four times a day (QID) | ORAL | Status: DC | PRN

## 2011-06-05 MED ORDER — HYDROMORPHONE HCL PF 1 MG/ML IJ SOLN
2.0000 mg | Freq: Once | INTRAMUSCULAR | Status: AC
  Administered 2011-06-05: 2 mg via INTRAMUSCULAR
  Filled 2011-06-05: qty 2

## 2011-06-05 MED ORDER — HYDROMORPHONE HCL 2 MG PO TABS: 2.0000 mg | ORAL_TABLET | ORAL | Status: AC | PRN

## 2011-06-05 NOTE — Discharge Instructions (Signed)
Return with increased pain or heavy bleeding or other worsening symptoms.

## 2011-06-05 NOTE — ED Notes (Signed)
Dr. Arelia Sneddon notified of patients complaint of cramping in the lower abdomen and sharp pain on the right side not relieved by oxycodone that she took at 6pm last night. Vital signs wnl and WBC 8.3 Hbg 11. No abdominal tenderness or rebound. Orders for Dilaudid 2mg  IM.

## 2011-06-05 NOTE — ED Provider Notes (Signed)
History     Chief Complaint  Patient presents with  . Post-op Problem   HPI S/P lap for endometriosis, ongoing pain, worse tonight, cramping lower abd, sharp pains right side. Taking Motrin and Percocet at home with no relief. + nausea, no fever/chills, vomiting, diarrhea. + constipation "for the last few days"- improved with colace.    Past Medical History  Diagnosis Date  . Kidney stones   . Asthma   . Depression   . Anxiety   . GERD (gastroesophageal reflux disease)   . Hypothyroidism   . Endometriosis   . History of sinus surgery 2010    MAXILLARY, ETHMOID, SPHENOID  . Obesity, Class III, BMI 40-49.9 (morbid obesity)   . SVD (spontaneous vaginal delivery)     x3    Past Surgical History  Procedure Date  . Knee surgery   . Nasal sinus surgery 2010  . Colposcopy 2007  . Laparoscopy 05/30/2011    Procedure: LAPAROSCOPY OPERATIVE;  Surgeon: Juluis Mire, MD;  Location: [facility];  Service: Gynecology;  Laterality: N/A;  YAG  LASER of Endometriosis.    Family History  Problem Relation Age of Onset  . Cancer Father     lung  . Alcohol abuse Father   . Cancer Other   . Depression Mother   . Depression Sister   . Suicidality Brother   . Depression Brother   . Alcohol abuse Brother   . Drug abuse Brother   . Depression Sister   . Anesthesia problems Neg Hx     History  Substance Use Topics  . Smoking status: Never Smoker   . Smokeless tobacco: Not on file  . Alcohol Use: No    Allergies:  Allergies  Allergen Reactions  . Clarithromycin Hives  . Moxifloxacin Hives    Prescriptions prior to admission  Medication Sig Dispense Refill  . clonazePAM (KLONOPIN) 0.5 MG tablet Take 1 tablet (0.5 mg total) by mouth 2 (two) times daily as needed. For anxiety and ptsd  60 tablet  1  . esomeprazole (NEXIUM) 40 MG capsule Take 1 capsule (40 mg total) by mouth daily.  90 capsule  3  . Fluticasone-Salmeterol (ADVAIR DISKUS) 250-50 MCG/DOSE AEPB Inhale 1 puff into the  lungs 2 (two) times daily.  1 each  11  . levothyroxine (SYNTHROID) 125 MCG tablet Take 1 tablet (125 mcg total) by mouth daily.  30 tablet  11  . sertraline (ZOLOFT) 100 MG tablet Take 150 mg by mouth daily.        Review of Systems  Constitutional: Negative.   Respiratory: Negative.   Cardiovascular: Negative.   Gastrointestinal: Positive for nausea and abdominal pain. Negative for vomiting, diarrhea and constipation.  Genitourinary: Negative for dysuria, urgency, frequency, hematuria and flank pain.       Negative for vaginal bleeding, vaginal discharge  Musculoskeletal: Negative.   Neurological: Negative.   Psychiatric/Behavioral: Negative.    Physical Exam   Blood pressure 134/94, pulse 84, temperature 97.3 F (36.3 C), resp. rate 20, height 5\' 4"  (1.626 m), weight 270 lb (122.471 kg), last menstrual period 05/08/2011, SpO2 100.00%.  Physical Exam  Nursing note and vitals reviewed. Constitutional: She is oriented to person, place, and time. She appears well-developed and well-nourished. No distress.  Cardiovascular: Normal rate.   Respiratory: Effort normal. No respiratory distress.  GI: Soft. She exhibits no distension and no mass. There is tenderness (right mid abdomen). There is no rebound and no guarding.  Musculoskeletal: Normal range of  motion.  Neurological: She is alert and oriented to person, place, and time.  Skin: Skin is warm and dry.       Incisions healing well, no erythema or exudate, bruising inferior to umbilical incision  Psychiatric: She has a normal mood and affect.    MAU Course  Procedures  Results for orders placed during the hospital encounter of 06/04/11 (from the past 24 hour(s))  URINALYSIS, ROUTINE W REFLEX MICROSCOPIC     Status: Abnormal   Collection Time   06/05/11 12:05 AM      Component Value Range   Color, Urine YELLOW  YELLOW    APPearance CLEAR  CLEAR    Specific Gravity, Urine 1.020  1.005 - 1.030    pH 6.0  5.0 - 8.0    Glucose,  UA NEGATIVE  NEGATIVE (mg/dL)   Hgb urine dipstick TRACE (*) NEGATIVE    Bilirubin Urine NEGATIVE  NEGATIVE    Ketones, ur NEGATIVE  NEGATIVE (mg/dL)   Protein, ur NEGATIVE  NEGATIVE (mg/dL)   Urobilinogen, UA 0.2  0.0 - 1.0 (mg/dL)   Nitrite NEGATIVE  NEGATIVE    Leukocytes, UA NEGATIVE  NEGATIVE   URINE MICROSCOPIC-ADD ON     Status: Normal   Collection Time   06/05/11 12:05 AM      Component Value Range   Squamous Epithelial / LPF RARE  RARE    WBC, UA 0-2  <3 (WBC/hpf)   RBC / HPF 3-6  <3 (RBC/hpf)  CBC     Status: Abnormal   Collection Time   06/05/11 12:25 AM      Component Value Range   WBC 8.3  4.0 - 10.5 (K/uL)   RBC 3.95  3.87 - 5.11 (MIL/uL)   Hemoglobin 11.0 (*) 12.0 - 15.0 (g/dL)   HCT 78.2 (*) 95.6 - 46.0 (%)   MCV 83.3  78.0 - 100.0 (fL)   MCH 27.8  26.0 - 34.0 (pg)   MCHC 33.4  30.0 - 36.0 (g/dL)   RDW 21.3  08.6 - 57.8 (%)   Platelets 252  150 - 400 (K/uL)   Pain improved with Dilaudid 2 mg IM.   Assessment and Plan  41 y.o. [id number] with post-op pain D/C home with rx Dilaudid 2 mg PO q 4-6 hours PRN pain, #20  F/U in office on Thursday   Keldric Poyer 06/05/2011, 12:38 AM

## 2011-06-12 ENCOUNTER — Ambulatory Visit (HOSPITAL_COMMUNITY): Payer: Self-pay | Admitting: Psychiatry

## 2011-06-17 ENCOUNTER — Encounter: Payer: Self-pay | Admitting: Internal Medicine

## 2011-06-17 ENCOUNTER — Ambulatory Visit (INDEPENDENT_AMBULATORY_CARE_PROVIDER_SITE_OTHER): Payer: 59 | Admitting: Internal Medicine

## 2011-06-17 ENCOUNTER — Ambulatory Visit (INDEPENDENT_AMBULATORY_CARE_PROVIDER_SITE_OTHER)
Admission: RE | Admit: 2011-06-17 | Discharge: 2011-06-17 | Disposition: A | Discharge status: Home / Self Care | Payer: 59 | Source: Ambulatory Visit | Attending: Internal Medicine | Admitting: Internal Medicine

## 2011-06-17 VITALS — BP 110/70 | HR 86 | Temp 97.0°F | Resp 16 | Wt 263.0 lb

## 2011-06-17 DIAGNOSIS — J209 Acute bronchitis, unspecified: Secondary | ICD-10-CM

## 2011-06-17 DIAGNOSIS — R05 Cough: Secondary | ICD-10-CM

## 2011-06-17 DIAGNOSIS — R059 Cough, unspecified: Secondary | ICD-10-CM

## 2011-06-17 MED ORDER — HYDROCOD POLST-CPM POLST ER 10-8 MG PO CP12: 1.0000 | ORAL_CAPSULE | Freq: Two times a day (BID) | ORAL | Status: DC | PRN

## 2011-06-17 MED ORDER — AMOXICILLIN-POT CLAVULANATE 875-125 MG PO TABS: 1.0000 | ORAL_TABLET | Freq: Two times a day (BID) | ORAL | Status: AC

## 2011-06-17 NOTE — Assessment & Plan Note (Signed)
Check a CXR to look for pna, mass, edema, etc

## 2011-06-17 NOTE — Assessment & Plan Note (Signed)
Start augmentin and a cough suppressant

## 2011-06-17 NOTE — Patient Instructions (Signed)

## 2011-06-17 NOTE — Progress Notes (Signed)
Subjective:    Patient ID: [name], female    DOB: 24-Apr-1970, 41 y.o.   MRN: [id number]  Cough This is a new problem. The current episode started 1 to 4 weeks ago. The problem has been gradually worsening. The problem occurs every few hours. The cough is productive of purulent sputum. Associated symptoms include chills, postnasal drip, rhinorrhea, a sore throat and sweats. Pertinent negatives include no chest pain, ear congestion, ear pain, fever, headaches, heartburn, hemoptysis, myalgias, nasal congestion, rash, shortness of breath, weight loss or wheezing. The symptoms are aggravated by nothing. She has tried OTC cough suppressant for the symptoms. The treatment provided mild relief.      Review of Systems  Constitutional: Positive for chills. Negative for fever, weight loss, activity change, appetite change, fatigue and unexpected weight change.  HENT: Positive for sore throat, rhinorrhea, sneezing, voice change, postnasal drip and sinus pressure. Negative for hearing loss, ear pain, nosebleeds, congestion, facial swelling, drooling, mouth sores, trouble swallowing, neck pain, neck stiffness, dental problem and tinnitus.   Eyes: Negative.   Respiratory: Positive for cough. Negative for apnea, hemoptysis, choking, chest tightness, shortness of breath, wheezing and stridor.   Cardiovascular: Negative for chest pain, palpitations and leg swelling.  Gastrointestinal: Negative for heartburn, nausea, vomiting, abdominal pain, diarrhea, constipation and abdominal distention.  Genitourinary: Negative.   Musculoskeletal: Negative for myalgias, back pain, joint swelling, arthralgias and gait problem.  Skin: Negative for color change, pallor, rash and wound.  Neurological: Negative for dizziness, tremors, seizures, syncope, facial asymmetry, speech difficulty, weakness, light-headedness, numbness and headaches.  Hematological: Negative for adenopathy. Does not bruise/bleed easily.    Psychiatric/Behavioral: Negative.        Objective:   Physical Exam  Vitals reviewed. Constitutional: She is oriented to person, place, and time. She appears well-developed and well-nourished. No distress.  HENT:  Head: Normocephalic and atraumatic. No trismus in the jaw.  Right Ear: Hearing, tympanic membrane, external ear and ear canal normal.  Left Ear: Hearing, tympanic membrane, external ear and ear canal normal.  Nose: Mucosal edema and rhinorrhea present. No nose lacerations, sinus tenderness, nasal deformity, septal deviation or nasal septal hematoma. No epistaxis.  No foreign bodies. Right sinus exhibits maxillary sinus tenderness. Right sinus exhibits no frontal sinus tenderness. Left sinus exhibits maxillary sinus tenderness. Left sinus exhibits no frontal sinus tenderness.  Mouth/Throat: Mucous membranes are normal. Mucous membranes are not pale, not dry and not cyanotic. No uvula swelling. Posterior oropharyngeal erythema present. No oropharyngeal exudate, posterior oropharyngeal edema or tonsillar abscesses.  Eyes: Conjunctivae are normal. Right eye exhibits no discharge. Left eye exhibits no discharge. No scleral icterus.  Neck: Normal range of motion. Neck supple. No JVD present. No tracheal deviation present. No thyromegaly present.  Cardiovascular: Normal rate, regular rhythm, normal heart sounds and intact distal pulses.  Exam reveals no gallop and no friction rub.   No murmur heard. Pulmonary/Chest: Effort normal and breath sounds normal. No stridor. No respiratory distress. She has no wheezes. She has no rales. She exhibits no tenderness.  Abdominal: Soft. Bowel sounds are normal. She exhibits no distension and no mass. There is no tenderness. There is no rebound and no guarding.  Musculoskeletal: Normal range of motion. She exhibits no edema and no tenderness.  Lymphadenopathy:    She has no cervical adenopathy.  Neurological: She is oriented to person, place, and time.   Skin: Skin is warm and dry. No rash noted. She is not diaphoretic. No erythema. No pallor.  Psychiatric: She has a normal mood and affect. Her behavior is normal. Judgment and thought content normal.          Assessment & Plan:

## 2011-07-03 ENCOUNTER — Other Ambulatory Visit (HOSPITAL_COMMUNITY): Payer: Self-pay

## 2011-07-03 DIAGNOSIS — F419 Anxiety disorder, unspecified: Secondary | ICD-10-CM

## 2011-07-03 MED ORDER — CLONAZEPAM 0.5 MG PO TABS: 0.5000 mg | ORAL_TABLET | Freq: Two times a day (BID) | ORAL | Status: DC | PRN

## 2011-07-03 NOTE — Telephone Encounter (Signed)
Patient reports she will run out of medications prior to her next appointment. Called pharmacy to confirm this. Will fill 7 day supply  to last until her next appointment.

## 2011-07-11 ENCOUNTER — Encounter (HOSPITAL_COMMUNITY): Payer: Self-pay | Admitting: Psychiatry

## 2011-07-11 ENCOUNTER — Ambulatory Visit (INDEPENDENT_AMBULATORY_CARE_PROVIDER_SITE_OTHER): Payer: 59 | Admitting: Psychiatry

## 2011-07-11 VITALS — BP 147/97 | HR 79 | Ht 63.5 in | Wt 268.0 lb

## 2011-07-11 DIAGNOSIS — F411 Generalized anxiety disorder: Secondary | ICD-10-CM

## 2011-07-11 DIAGNOSIS — F419 Anxiety disorder, unspecified: Secondary | ICD-10-CM

## 2011-07-11 MED ORDER — CLONAZEPAM 0.5 MG PO TABS: 0.5000 mg | ORAL_TABLET | Freq: Three times a day (TID) | ORAL | Status: DC

## 2011-07-11 MED ORDER — SERTRALINE HCL 100 MG PO TABS: 200.0000 mg | ORAL_TABLET | Freq: Every day | ORAL | Status: DC
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
## 2011-07-12 LAB — DRUG SCREEN, URINE
Amphetamine Screen, Ur: NEGATIVE
Barbiturate Quant, Ur: NEGATIVE
Cocaine Metabolites: NEGATIVE
Marijuana Metabolite: NEGATIVE
Methadone: NEGATIVE

## 2011-07-15 ENCOUNTER — Telehealth (HOSPITAL_COMMUNITY): Payer: Self-pay | Admitting: Psychiatry

## 2011-07-15 DIAGNOSIS — F419 Anxiety disorder, unspecified: Secondary | ICD-10-CM

## 2011-07-15 MED ORDER — SERTRALINE HCL 100 MG PO TABS: 200.0000 mg | ORAL_TABLET | Freq: Every day | ORAL | Status: DC

## 2011-07-15 NOTE — Telephone Encounter (Signed)
Called pharmacy to correct quantity of tablets to #60.

## 2011-07-26 ENCOUNTER — Ambulatory Visit: Payer: 59 | Admitting: Internal Medicine

## 2011-07-30 ENCOUNTER — Encounter: Payer: Self-pay | Admitting: Internal Medicine

## 2011-07-30 ENCOUNTER — Ambulatory Visit (INDEPENDENT_AMBULATORY_CARE_PROVIDER_SITE_OTHER): Payer: 59 | Admitting: Internal Medicine

## 2011-07-30 VITALS — BP 100/78 | HR 88 | Temp 98.1°F | Resp 16 | Ht 64.0 in | Wt 268.0 lb

## 2011-07-30 DIAGNOSIS — E039 Hypothyroidism, unspecified: Secondary | ICD-10-CM

## 2011-07-30 DIAGNOSIS — J209 Acute bronchitis, unspecified: Secondary | ICD-10-CM

## 2011-07-30 DIAGNOSIS — J309 Allergic rhinitis, unspecified: Secondary | ICD-10-CM

## 2011-07-30 DIAGNOSIS — J019 Acute sinusitis, unspecified: Secondary | ICD-10-CM

## 2011-07-30 DIAGNOSIS — J302 Other seasonal allergic rhinitis: Secondary | ICD-10-CM | POA: Insufficient documentation

## 2011-07-30 MED ORDER — METHYLPREDNISOLONE ACETATE 80 MG/ML IJ SUSP
120.0000 mg | Freq: Once | INTRAMUSCULAR | Status: AC
  Administered 2011-07-30: 120 mg via INTRAMUSCULAR

## 2011-07-30 MED ORDER — CEFUROXIME AXETIL 500 MG PO TABS: 500.0000 mg | ORAL_TABLET | Freq: Two times a day (BID) | ORAL | Status: AC

## 2011-07-30 MED ORDER — MOMETASONE FUROATE 50 MCG/ACT NA SUSP: 2.0000 | Freq: Every day | NASAL | Status: DC

## 2011-07-30 MED ORDER — CETIRIZINE-PSEUDOEPHEDRINE ER 5-120 MG PO TB12: 1.0000 | ORAL_TABLET | Freq: Two times a day (BID) | ORAL | Status: DC

## 2011-07-30 NOTE — Assessment & Plan Note (Signed)
She needs a repeat TSH today

## 2011-07-30 NOTE — Patient Instructions (Signed)

## 2011-07-30 NOTE — Assessment & Plan Note (Signed)
She was given depo-medrol IM for the inflammation and other symptoms and will start zyrtec-d and nasonex ns

## 2011-07-30 NOTE — Progress Notes (Signed)
Subjective:    Patient ID: [name], female    DOB: [birth year], 41 y.o.   MRN: [id number]  Sinusitis This is a recurrent problem. The current episode started in the past 7 days. The problem has been gradually worsening since onset. There has been no fever. Her pain is at a severity of 0/10. She is experiencing no pain. Associated symptoms include chills, congestion, sinus pressure, sneezing and a sore throat. Pertinent negatives include no diaphoresis, ear pain or neck pain.      Review of Systems  Constitutional: Positive for chills. Negative for fever, diaphoresis, activity change, appetite change, fatigue and unexpected weight change.  HENT: Positive for congestion, sore throat, rhinorrhea, sneezing, postnasal drip and sinus pressure. Negative for hearing loss, ear pain, nosebleeds, facial swelling, drooling, mouth sores, trouble swallowing, neck pain, neck stiffness, dental problem, voice change, tinnitus and ear discharge.   Eyes: Negative.   Respiratory: Negative.   Cardiovascular: Negative for chest pain, palpitations and leg swelling.  Gastrointestinal: Negative.   Genitourinary: Negative.   Musculoskeletal: Negative.   Skin: Negative for color change, pallor, rash and wound.  Neurological: Negative.   Hematological: Negative for adenopathy. Does not bruise/bleed easily.  Psychiatric/Behavioral: Negative.        Objective:   Physical Exam  Vitals reviewed. Constitutional: She is oriented to person, place, and time. She appears well-developed and well-nourished.  Non-toxic appearance. She does not have a sickly appearance. She does not appear ill. No distress.  HENT:  Head: Normocephalic and atraumatic. No trismus in the jaw.  Right Ear: Hearing, tympanic membrane, external ear and ear canal normal.  Left Ear: Hearing, tympanic membrane, external ear and ear canal normal.  Nose: Mucosal edema and rhinorrhea present. No nose lacerations, sinus tenderness, nasal  deformity, septal deviation or nasal septal hematoma. No epistaxis.  No foreign bodies. Right sinus exhibits maxillary sinus tenderness. Right sinus exhibits no frontal sinus tenderness. Left sinus exhibits maxillary sinus tenderness. Left sinus exhibits no frontal sinus tenderness.  Mouth/Throat: Oropharynx is clear and moist and mucous membranes are normal. Mucous membranes are not pale, not dry and not cyanotic. No uvula swelling. No oropharyngeal exudate, posterior oropharyngeal edema, posterior oropharyngeal erythema or tonsillar abscesses.  Eyes: Conjunctivae are normal. Right eye exhibits no discharge. Left eye exhibits no discharge. No scleral icterus.  Neck: Normal range of motion. Neck supple. No JVD present. No tracheal deviation present. No thyromegaly present.  Cardiovascular: Normal rate, regular rhythm, normal heart sounds and intact distal pulses.  Exam reveals no gallop and no friction rub.   No murmur heard. Pulmonary/Chest: Effort normal and breath sounds normal. No stridor. No respiratory distress. She has no wheezes. She has no rales. She exhibits no tenderness.  Abdominal: Soft. Bowel sounds are normal. She exhibits no distension and no mass. There is no tenderness. There is no rebound and no guarding.  Musculoskeletal: Normal range of motion. She exhibits no edema and no tenderness.  Lymphadenopathy:    She has no cervical adenopathy.  Neurological: She is oriented to person, place, and time.  Skin: Skin is warm and dry. No rash noted. She is not diaphoretic. No erythema. No pallor.  Psychiatric: She has a normal mood and affect. Her behavior is normal. Judgment and thought content normal.     Lab Results  Component Value Date   WBC 8.3 06/05/2011   HGB 11.0* 06/05/2011   HCT 32.9* 06/05/2011   PLT 252 06/05/2011   GLUCOSE 109* 03/22/2011   CHOL 198  03/22/2011   TRIG 74.0 03/22/2011   HDL 61.20 03/22/2011   LDLCALC 122* 03/22/2011   ALT 28 03/22/2011   AST 24 03/22/2011   NA  140 03/22/2011   K 3.6 03/22/2011   CL 104 03/22/2011   CREATININE 0.8 03/22/2011   BUN 11 03/22/2011   CO2 30 03/22/2011   TSH 14.29* 03/22/2011       Assessment & Plan:

## 2011-07-30 NOTE — Assessment & Plan Note (Signed)
Start ceftin for the infection 

## 2011-08-12 ENCOUNTER — Encounter (HOSPITAL_COMMUNITY): Payer: Self-pay | Admitting: Psychiatry

## 2011-08-12 ENCOUNTER — Ambulatory Visit (INDEPENDENT_AMBULATORY_CARE_PROVIDER_SITE_OTHER): Payer: 59 | Admitting: Psychiatry

## 2011-08-12 VITALS — BP 124/78 | HR 83 | Ht 64.0 in | Wt 288.0 lb

## 2011-08-12 DIAGNOSIS — F419 Anxiety disorder, unspecified: Secondary | ICD-10-CM

## 2011-08-12 DIAGNOSIS — F411 Generalized anxiety disorder: Secondary | ICD-10-CM

## 2011-08-12 MED ORDER — SERTRALINE HCL 100 MG PO TABS: 200.0000 mg | ORAL_TABLET | Freq: Every day | ORAL | Status: DC

## 2011-08-12 MED ORDER — CLONAZEPAM 0.5 MG PO TABS: 0.5000 mg | ORAL_TABLET | Freq: Three times a day (TID) | ORAL | Status: DC
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
## 2011-08-19 ENCOUNTER — Emergency Department
Admission: EM | Admit: 2011-08-19 | Discharge: 2011-08-19 | Disposition: A | Discharge status: Home / Self Care | Payer: 59 | Source: Home / Self Care | Attending: Family Medicine | Admitting: Family Medicine

## 2011-08-19 DIAGNOSIS — M7522 Bicipital tendinitis, left shoulder: Secondary | ICD-10-CM

## 2011-08-19 DIAGNOSIS — M62838 Other muscle spasm: Secondary | ICD-10-CM

## 2011-08-19 DIAGNOSIS — M542 Cervicalgia: Secondary | ICD-10-CM

## 2011-08-19 DIAGNOSIS — M7520 Bicipital tendinitis, unspecified shoulder: Secondary | ICD-10-CM

## 2011-08-19 DIAGNOSIS — S29012A Strain of muscle and tendon of back wall of thorax, initial encounter: Secondary | ICD-10-CM

## 2011-08-19 MED ORDER — METAXALONE 800 MG PO TABS: ORAL_TABLET | ORAL | Status: DC

## 2011-08-19 NOTE — ED Notes (Signed)
Left neck, shoulder pain and stiffness.  Woke up with it this morning

## 2011-08-19 NOTE — Discharge Instructions (Signed)
Apply ice pack several times daily.  May take Tylenol for pain.  Begin stretching exercises as per instruction sheets.

## 2011-08-19 NOTE — ED Provider Notes (Signed)
History     CSN: [id number]  Arrival date & time 08/19/11  1459   First MD Initiated Contact with Patient 08/19/11 1512      Chief Complaint  Patient presents with  . Neck Pain     Patient is a 41 y.o. female presenting with back pain. The history is provided by the patient.  Back Pain  This is a new problem. The current episode started 6 to 12 hours ago. The problem occurs constantly. The problem has been gradually worsening. The pain is associated with lifting heavy objects. Pain location: left upper back. The quality of the pain is described as aching. The pain does not radiate. The pain is at a severity of 10/10. The pain is moderate. The symptoms are aggravated by certain positions. The pain is worse during the day. Pertinent negatives include no chest pain, no fever, no numbness, no weight loss, no headaches, no abdominal pain, no abdominal swelling, no bowel incontinence, no perianal numbness, no bladder incontinence, no paresthesias, no paresis, no tingling and no weakness. She has tried NSAIDs and ice for the symptoms. The treatment provided no relief. Risk factors include obesity.    Past Medical History  Diagnosis Date  . Kidney stones   . Asthma   . Depression   . Anxiety   . GERD (gastroesophageal reflux disease)   . Hypothyroidism   . Endometriosis   . History of sinus surgery 2010    MAXILLARY, ETHMOID, SPHENOID  . Obesity, Class III, BMI 40-49.9 (morbid obesity)   . SVD (spontaneous vaginal delivery)     x3    Past Surgical History  Procedure Date  . Knee surgery   . Nasal sinus surgery 2010  . Colposcopy 2007  . Laparoscopy 05/30/2011    Procedure: LAPAROSCOPY OPERATIVE;  Surgeon: Juluis Mire, MD;  Location: [facility];  Service: Gynecology;  Laterality: N/A;  YAG  LASER of Endometriosis.    Family History  Problem Relation Age of Onset  . Cancer Father     lung  . Alcohol abuse Father   . Cancer Other   . Depression Mother   . Depression Sister   .  Suicidality Brother   . Depression Brother   . Alcohol abuse Brother   . Drug abuse Brother   . Depression Sister   . Anesthesia problems Neg Hx     History  Substance Use Topics  . Smoking status: Never Smoker   . Smokeless tobacco: Not on file  . Alcohol Use: No    OB History    Grav Para Term Preterm Abortions TAB SAB Ect Mult Living   3 3 3       3       Review of Systems  Constitutional: Negative for fever and weight loss.  Cardiovascular: Negative for chest pain.  Gastrointestinal: Negative for abdominal pain and bowel incontinence.  Genitourinary: Negative for bladder incontinence.  Musculoskeletal: Positive for back pain.  Neurological: Negative for tingling, weakness, numbness, headaches and paresthesias.    Allergies  Clarithromycin and Moxifloxacin  Home Medications   Current Outpatient Rx  Name Route Sig Dispense Refill  . CETIRIZINE-PSEUDOEPHEDRINE ER 5-120 MG PO TB12 Oral Take 1 tablet by mouth 2 (two) times daily. 60 tablet 11  . CLONAZEPAM 0.5 MG PO TABS Oral Take 1 tablet (0.5 mg total) by mouth 3 (three) times daily. For anxiety and ptsd 90 tablet 1  . ESOMEPRAZOLE MAGNESIUM 40 MG PO CPDR Oral Take 1 capsule (40 mg  total) by mouth daily. 90 capsule 3  . FLUTICASONE-SALMETEROL 250-50 MCG/DOSE IN AEPB Inhalation Inhale 1 puff into the lungs 2 (two) times daily. 1 each 11  . LEVOTHYROXINE SODIUM 125 MCG PO TABS Oral Take 1 tablet (125 mcg total) by mouth daily. 30 tablet 11  . METAXALONE 800 MG PO TABS  Take one tab by mouth at bedtime as needed 12 tablet 0  . MOMETASONE FUROATE 50 MCG/ACT NA SUSP Nasal Place 2 sprays into the nose daily. 17 g 12  . SERTRALINE HCL 100 MG PO TABS Oral Take 2 tablets (200 mg total) by mouth daily. 60 tablet 1    BP 131/86  Pulse 85  Temp(Src) 98.2 F (36.8 C) (Oral)  Resp 18  Ht 5\' 2"  (1.575 m)  Wt 267 lb (121.11 kg)  BMI 48.83 kg/m2  SpO2 99%  LMP 08/05/2011  Physical Exam Nursing notes and Vital Signs  reviewed. Appearance:  Patient appears stated age, and in no acute distress.  Patient is obese (BMI 48.9) Eyes:  Pupils are equal, round, and reactive to light and accomodation.  Extraocular movement is intact.  Conjunctivae are not inflamed  Neck:  Supple.  No adenopathy.  There is distinct tenderness over the left sternocleidomastoid muscle and left trapezius muscle. Lungs:  Clear to auscultation.  Breath sounds are equal.  Back:  There is distinct tenderness over medial and inferior edges of left scapula.  Pain elicited by resisted abduction of left shoulder while palpating left rhomboid muscles.  Heart:  Regular rate and rhythm without murmurs, rubs, or gallops.  Extremities:  Left shoulder has decreased range of motion.  There is distinct tenderness over the insertion of right biceps tendons.  Distal Neurovascular function is intact.     ED Course  Procedures none      1. Neck muscle spasm   2. Rhomboid muscle strain   3. Biceps tendonitis on left       MDM   Begin Skelaxin at bedtime. Apply ice pack several times daily.  May take Tylenol for pain.  Begin stretching exercises as per instruction sheets.([facility] information and instruction handout given)  Followup with [facility] if not improving about two weeks.        Lattie Haw, MD 08/19/11 [phone]

## 2011-08-25 ENCOUNTER — Emergency Department (HOSPITAL_COMMUNITY): Payer: PRIVATE HEALTH INSURANCE

## 2011-08-25 ENCOUNTER — Emergency Department (HOSPITAL_COMMUNITY)
Admission: EM | Admit: 2011-08-25 | Discharge: 2011-08-25 | Disposition: A | Discharge status: Home / Self Care | Payer: PRIVATE HEALTH INSURANCE | Attending: Emergency Medicine | Admitting: Emergency Medicine

## 2011-08-25 ENCOUNTER — Encounter (HOSPITAL_COMMUNITY): Payer: Self-pay | Admitting: *Deleted

## 2011-08-25 DIAGNOSIS — M25569 Pain in unspecified knee: Secondary | ICD-10-CM | POA: Insufficient documentation

## 2011-08-25 DIAGNOSIS — Z87442 Personal history of urinary calculi: Secondary | ICD-10-CM | POA: Insufficient documentation

## 2011-08-25 DIAGNOSIS — M25572 Pain in left ankle and joints of left foot: Secondary | ICD-10-CM

## 2011-08-25 DIAGNOSIS — W19XXXA Unspecified fall, initial encounter: Secondary | ICD-10-CM

## 2011-08-25 DIAGNOSIS — M25579 Pain in unspecified ankle and joints of unspecified foot: Secondary | ICD-10-CM | POA: Insufficient documentation

## 2011-08-25 DIAGNOSIS — E039 Hypothyroidism, unspecified: Secondary | ICD-10-CM | POA: Insufficient documentation

## 2011-08-25 DIAGNOSIS — W010XXA Fall on same level from slipping, tripping and stumbling without subsequent striking against object, initial encounter: Secondary | ICD-10-CM | POA: Insufficient documentation

## 2011-08-25 DIAGNOSIS — J45909 Unspecified asthma, uncomplicated: Secondary | ICD-10-CM | POA: Insufficient documentation

## 2011-08-25 DIAGNOSIS — F3289 Other specified depressive episodes: Secondary | ICD-10-CM | POA: Insufficient documentation

## 2011-08-25 DIAGNOSIS — F411 Generalized anxiety disorder: Secondary | ICD-10-CM | POA: Insufficient documentation

## 2011-08-25 DIAGNOSIS — F329 Major depressive disorder, single episode, unspecified: Secondary | ICD-10-CM | POA: Insufficient documentation

## 2011-08-25 DIAGNOSIS — K219 Gastro-esophageal reflux disease without esophagitis: Secondary | ICD-10-CM | POA: Insufficient documentation

## 2011-08-25 DIAGNOSIS — S8991XA Unspecified injury of right lower leg, initial encounter: Secondary | ICD-10-CM

## 2011-08-25 MED ORDER — IBUPROFEN 600 MG PO TABS: 600.0000 mg | ORAL_TABLET | Freq: Three times a day (TID) | ORAL | Status: AC | PRN

## 2011-08-25 MED ORDER — KETOROLAC TROMETHAMINE 60 MG/2ML IM SOLN
60.0000 mg | Freq: Once | INTRAMUSCULAR | Status: AC
  Administered 2011-08-25: 60 mg via INTRAMUSCULAR
  Filled 2011-08-25: qty 2

## 2011-08-25 MED ORDER — ONDANSETRON 4 MG PO TBDP
4.0000 mg | ORAL_TABLET | Freq: Once | ORAL | Status: AC
  Administered 2011-08-25: 4 mg via ORAL
  Filled 2011-08-25: qty 1

## 2011-08-25 MED ORDER — TRAMADOL HCL 50 MG PO TABS: 50.0000 mg | ORAL_TABLET | Freq: Four times a day (QID) | ORAL | Status: AC | PRN

## 2011-08-25 NOTE — ED Provider Notes (Signed)
History     CSN: [id number]  Arrival date & time 08/25/11  0865   First MD Initiated Contact with Patient 08/25/11 [phone]      Chief Complaint  Patient presents with  . Fall  . Knee Pain    (Consider location/radiation/quality/duration/timing/severity/associated sxs/prior treatment) Patient is a 41 y.o. female presenting with fall. The history is provided by the patient.  Fall The accident occurred less than 1 hour ago. The fall occurred while walking (slipped on a wet spot in floor). Distance fallen: from standing. She landed on a hard floor. There was no blood loss. Point of impact: right knee, right elbow. Pain location: right knee, left ankle. The pain is moderate. She was not ambulatory at the scene. There was no entrapment after the fall. There was no drug use involved in the accident. There was no alcohol use involved in the accident. Associated symptoms include nausea. Pertinent negatives include no numbness, no abdominal pain, no vomiting and no loss of consciousness. The symptoms are aggravated by use of the injured limb. Prehospitalization: none. She has tried ice for the symptoms.    Past Medical History  Diagnosis Date  . Kidney stones   . Asthma   . Depression   . Anxiety   . GERD (gastroesophageal reflux disease)   . Hypothyroidism   . Endometriosis   . History of sinus surgery 2010    MAXILLARY, ETHMOID, SPHENOID  . Obesity, Class III, BMI 40-49.9 (morbid obesity)   . SVD (spontaneous vaginal delivery)     x3    Past Surgical History  Procedure Date  . Knee surgery   . Nasal sinus surgery 2010  . Colposcopy 2007  . Laparoscopy 05/30/2011    Procedure: LAPAROSCOPY OPERATIVE;  Surgeon: Juluis Mire, MD;  Location: [facility];  Service: Gynecology;  Laterality: N/A;  YAG  LASER of Endometriosis.    Family History  Problem Relation Age of Onset  . Cancer Father     lung  . Alcohol abuse Father   . Cancer Other   . Depression Mother   . Depression Sister   .  Suicidality Brother   . Depression Brother   . Alcohol abuse Brother   . Drug abuse Brother   . Depression Sister   . Anesthesia problems Neg Hx     History  Substance Use Topics  . Smoking status: Never Smoker   . Smokeless tobacco: Not on file  . Alcohol Use: No    OB History    Grav Para Term Preterm Abortions TAB SAB Ect Mult Living   3 3 3       3       Review of Systems  HENT: Negative for neck pain.   Cardiovascular: Negative for chest pain.  Gastrointestinal: Positive for nausea. Negative for vomiting and abdominal pain.  Musculoskeletal: Negative for back pain and joint swelling.       See HPI, otherwise negative  Skin: Negative for color change and wound.  Neurological: Negative for loss of consciousness, syncope, weakness and numbness.  Psychiatric/Behavioral: Negative for confusion.    Allergies  Clarithromycin and Moxifloxacin  Home Medications   Current Outpatient Rx  Name Route Sig Dispense Refill  . CETIRIZINE-PSEUDOEPHEDRINE ER 5-120 MG PO TB12 Oral Take 1 tablet by mouth 2 (two) times daily. 60 tablet 11  . CLONAZEPAM 0.5 MG PO TABS Oral Take 1 tablet (0.5 mg total) by mouth 3 (three) times daily. For anxiety and ptsd 90 tablet 1  .  ESOMEPRAZOLE MAGNESIUM 40 MG PO CPDR Oral Take 1 capsule (40 mg total) by mouth daily. 90 capsule 3  . FLUTICASONE-SALMETEROL 250-50 MCG/DOSE IN AEPB Inhalation Inhale 1 puff into the lungs 2 (two) times daily. 1 each 11  . LEVOTHYROXINE SODIUM 125 MCG PO TABS Oral Take 1 tablet (125 mcg total) by mouth daily. 30 tablet 11  . METAXALONE 800 MG PO TABS  Take one tab by mouth at bedtime as needed 12 tablet 0  . MOMETASONE FUROATE 50 MCG/ACT NA SUSP Nasal Place 2 sprays into the nose daily. 17 g 12  . SERTRALINE HCL 100 MG PO TABS Oral Take 2 tablets (200 mg total) by mouth daily. 60 tablet 1    BP 132/95  Pulse 82  Temp(Src) 98.7 F (37.1 C) (Oral)  Resp 20  SpO2 98%  LMP 08/04/2011  Physical Exam  Nursing note and  vitals reviewed. Constitutional: She is oriented to person, place, and time. She appears well-developed and well-nourished. No distress.  HENT:  Head: Normocephalic and atraumatic.  Right Ear: External ear normal.  Left Ear: External ear normal.  Eyes: Conjunctivae are normal.  Neck: Normal range of motion. Neck supple.  Cardiovascular: Normal rate and regular rhythm.   Pulses:      Radial pulses are 2+ on the right side, and 2+ on the left side.       Dorsalis pedis pulses are 2+ on the right side, and 2+ on the left side.  Pulmonary/Chest: Effort normal. No respiratory distress.  Musculoskeletal: She exhibits tenderness. She exhibits no edema.       Right knee: She exhibits decreased range of motion (flexion limited to 95 degrees. full extension). She exhibits no swelling, no effusion, no ecchymosis, no deformity, no laceration, no erythema, normal alignment, no LCL laxity, normal patellar mobility and no MCL laxity. tenderness found. Medial joint line and patellar tendon tenderness noted. No lateral joint line tenderness noted.       Left ankle: She exhibits normal range of motion, no swelling, no ecchymosis, no deformity, no laceration and normal pulse. tenderness. AITFL tenderness found. No lateral malleolus, no medial malleolus, no CF ligament, no posterior TFL, no head of 5th metatarsal and no proximal fibula tenderness found. Achilles tendon normal.       Strength of foot plantar/dorsi flexion, knee flex/ext 5/5.  Neurological: She is alert and oriented to person, place, and time.       Sensation intact to light touch in all extremities distally  Skin: Skin is warm and dry.       No abrasions, lacerations, ecchymosis seen    ED Course  Procedures (including critical care time)  Labs Reviewed - No data to display Dg Knee Complete 4 Views Right  08/25/2011  *RADIOLOGY REPORT*  Clinical Data: Knee pain post fall  RIGHT KNEE - COMPLETE 4+ VIEW  Comparison: 05/07/2008  Findings: Four  views of the right knee submitted.  Stable postsurgical changes proximal tibia and distal femur consistent with prior anterior cruciate ligament repair.  No acute fracture or subluxation.  No significant joint effusion.  Minimal spurring of the medial femoral condyle. Minimal spurring of medial tibial plateau.  IMPRESSION: No acute fracture or subluxation.  No significant change.  Original Report Authenticated By: Natasha Mead, M.D.     1. Fall   2. Right knee injury   3. Left ankle pain       MDM  Fall at work today. Pain to right knee and left ankle.  Although impact to right elbow, no pain or abnormal findings to this area. Unable to bear weight on right knee with pain to bony areas- x-ray performed with no acute finding. Given possibility of ligamentous or cartilagenous injury, pt given knee immobilizer and crutches and advised to f/u with orthopedics if no pain improvement in the next several days/if she cannot bear weight. In regard to left ankle, no swelling on exam, full ROM with good strength and pain only over ATFL. Discussed with pt lack of indications for ankle imaging, she agrees. Ace wrap placed for compression of likely mild ankle sprain (twisted in fall). She will be given ibuprofen for pain and advised to use TID ice for the next several days. Requests additional pain medication in case ibuprofen not sufficient, will give rx for ultram.        Shaaron Adler, PA-C 08/25/11 [phone]

## 2011-08-25 NOTE — Progress Notes (Signed)
Orthopedic Tech Progress Note Patient Details:  [name] 1971/02/17 [id number]  Other Ortho Devices Type of Ortho Device: Knee Immobilizer;Crutches;Ace wrap Ortho Device Location: right knee Ortho Device Interventions: Application   Fredrica Capano T 08/25/2011, 10:25 AM

## 2011-08-25 NOTE — Discharge Instructions (Signed)
As we discussed, take the ibuprofen three times a day with food scheduled for the next few days to help reduce swelling/inflammation and pain. After the first few days, take only as needed. You can take the ultram as needed for more severe pain. Apply ice to your injured areas for 15-20 minutes three times a day for the next several days to help reduce swelling and inflammation.  Call the orthopedic doctor for follow-up and further evaluation of your knee pain if it does not begin to improve in the next several days.   Knee Pain The knee is the complex joint between your thigh and your lower leg. It is made up of bones, tendons, ligaments, and cartilage. The bones that make up the knee are:  The femur in the thigh.   The tibia and fibula in the lower leg.   The patella or kneecap riding in the groove on the lower femur.  CAUSES  Knee pain is a common complaint with many causes. A few of these causes are:  Injury, such as:   A ruptured ligament or tendon injury.   Torn cartilage.   Medical conditions, such as:   Gout   Arthritis   Infections   Overuse, over training or overdoing a physical activity.  Knee pain can be minor or severe. Knee pain can accompany debilitating injury. Minor knee problems often respond well to self-care measures or get well on their own. More serious injuries may need medical intervention or even surgery. SYMPTOMS The knee is complex. Symptoms of knee problems can vary widely. Some of the problems are:  Pain with movement and weight bearing.   Swelling and tenderness.   Buckling of the knee.   Inability to straighten or extend your knee.   Your knee locks and you cannot straighten it.   Warmth and redness with pain and fever.   Deformity or dislocation of the kneecap.  DIAGNOSIS  Determining what is wrong may be very straight forward such as when there is an injury. It can also be challenging because of the complexity of the knee. Tests to  make a diagnosis may include:  Your caregiver taking a history and doing a physical exam.   Routine X-rays can be used to rule out other problems. X-rays will not reveal a cartilage tear. Some injuries of the knee can be diagnosed by:   Arthroscopy a surgical technique by which a small video camera is inserted through tiny incisions on the sides of the knee. This procedure is used to examine and repair internal knee joint problems. Tiny instruments can be used during arthroscopy to repair the torn knee cartilage (meniscus).   Arthrography is a radiology technique. A contrast liquid is directly injected into the knee joint. Internal structures of the knee joint then become visible on X-ray film.   An MRI scan is a non x-ray radiology procedure in which magnetic fields and a computer produce two- or three-dimensional images of the inside of the knee. Cartilage tears are often visible using an MRI scanner. MRI scans have largely replaced arthrography in diagnosing cartilage tears of the knee.   Blood work.   Examination of the fluid that helps to lubricate the knee joint (synovial fluid). This is done by taking a sample out using a needle and a syringe.  TREATMENT The treatment of knee problems depends on the cause. Some of these treatments are:  Depending on the injury, proper casting, splinting, surgery or physical therapy care will be needed.  Give yourself adequate recovery time. Do not overuse your joints. If you begin to get sore during workout routines, back off. Slow down or do fewer repetitions.   For repetitive activities such as cycling or running, maintain your strength and nutrition.   Alternate muscle groups. For example if you are a weight lifter, work the upper body on one day and the lower body the next.   Either tight or weak muscles do not give the proper support for your knee. Tight or weak muscles do not absorb the stress placed on the knee joint. Keep the muscles  surrounding the knee strong.   Take care of mechanical problems.   If you have flat feet, orthotics or special shoes may help. See your caregiver if you need help.   Arch supports, sometimes with wedges on the inner or outer aspect of the heel, can help. These can shift pressure away from the side of the knee most bothered by osteoarthritis.   A brace called an "unloader" brace also may be used to help ease the pressure on the most arthritic side of the knee.   If your caregiver has prescribed crutches, braces, wraps or ice, use as directed. The acronym for this is PRICE. This means protection, rest, ice, compression and elevation.   Nonsteroidal anti-inflammatory drugs (NSAID's), can help relieve pain. But if taken immediately after an injury, they may actually increase swelling. Take NSAID's with food in your stomach. Stop them if you develop stomach problems. Do not take these if you have a history of ulcers, stomach pain or bleeding from the bowel. Do not take without your caregiver's approval if you have problems with fluid retention, heart failure, or kidney problems.   For ongoing knee problems, physical therapy may be helpful.   Glucosamine and chondroitin are over-the-counter dietary supplements. Both may help relieve the pain of osteoarthritis in the knee. These medicines are different from the usual anti-inflammatory drugs. Glucosamine may decrease the rate of cartilage destruction.   Injections of a corticosteroid drug into your knee joint may help reduce the symptoms of an arthritis flare-up. They may provide pain relief that lasts a few months. You may have to wait a few months between injections. The injections do have a small increased risk of infection, water retention and elevated blood sugar levels.   Hyaluronic acid injected into damaged joints may ease pain and provide lubrication. These injections may work by reducing inflammation. A series of shots may give relief for as  long as 6 months.   Topical painkillers. Applying certain ointments to your skin may help relieve the pain and stiffness of osteoarthritis. Ask your pharmacist for suggestions. Many over the-counter products are approved for temporary relief of arthritis pain.   In some countries, doctors often prescribe topical NSAID's for relief of chronic conditions such as arthritis and tendinitis. A review of treatment with NSAID creams found that they worked as well as oral medications but without the serious side effects.  PREVENTION  Maintain a healthy weight. Extra pounds put more strain on your joints.   Get strong, stay limber. Weak muscles are a common cause of knee injuries. Stretching is important. Include flexibility exercises in your workouts.   Be smart about exercise. If you have osteoarthritis, chronic knee pain or recurring injuries, you may need to change the way you exercise. This does not mean you have to stop being active. If your knees ache after jogging or playing basketball, consider switching to swimming, water aerobics  or other low-impact activities, at least for a few days a week. Sometimes limiting high-impact activities will provide relief.   Make sure your shoes fit well. Choose footwear that is right for your sport.   Protect your knees. Use the proper gear for knee-sensitive activities. Use kneepads when playing volleyball or laying carpet. Buckle your seat belt every time you drive. Most shattered kneecaps occur in car accidents.   Rest when you are tired.  SEEK MEDICAL CARE IF:  You have knee pain that is continual and does not seem to be getting better.  SEEK IMMEDIATE MEDICAL CARE IF:  Your knee joint feels hot to the touch and you have a high fever. MAKE SURE YOU:   Understand these instructions.   Will watch your condition.   Will get help right away if you are not doing well or get worse.  Document Released: 01/27/2007 Document Revised: 03/21/2011 Document  Reviewed: 01/27/2007 [facility] Patient Information 2012 [place], [place].         Ankle Sprain An ankle sprain is an injury to the strong, fibrous tissues (ligaments) that hold the bones of your ankle joint together.  CAUSES Ankle sprain usually is caused by a fall or by twisting your ankle. People who participate in sports are more prone to these types of injuries.  SYMPTOMS  Symptoms of ankle sprain include:  Pain in your ankle. The pain may be present at rest or only when you are trying to stand or walk.   Swelling.   Bruising. Bruising may develop immediately or within 1 to 2 days after your injury.   Difficulty standing or walking.  DIAGNOSIS  Your caregiver will ask you details about your injury and perform a physical exam of your ankle to determine if you have an ankle sprain. During the physical exam, your caregiver will press and squeeze specific areas of your foot and ankle. Your caregiver will try to move your ankle in certain ways. An X-ray exam may be done to be sure a bone was not broken or a ligament did not separate from one of the bones in your ankle (avulsion).  TREATMENT  Certain types of braces can help stabilize your ankle. Your caregiver can make a recommendation for this. Your caregiver may recommend the use of medication for pain. If your sprain is severe, your caregiver may refer you to a surgeon who helps to restore function to parts of your skeletal system (orthopedist) or a physical therapist. HOME CARE INSTRUCTIONS  Apply ice to your injury for 1 to 2 days or as directed by your caregiver. Applying ice helps to reduce inflammation and pain.  Put ice in a plastic bag.   Place a towel between your skin and the bag.   Leave the ice on for 15 to 20 minutes at a time, every 2 hours while you are awake.   Take over-the-counter or prescription medicines for pain, discomfort, or fever only as directed by your caregiver.   Keep your injured leg elevated, when  possible, to lessen swelling.   If your caregiver recommends crutches, use them as instructed. Gradually, put weight on the affected ankle. Continue to use crutches or a cane until you can walk without feeling pain in your ankle.   If you have a plaster splint, wear the splint as directed by your caregiver. Do not rest it on anything harder than a pillow the first 24 hours. Do not put weight on it. Do not get it wet. You may  take it off to take a shower or bath.   You may have been given an elastic bandage to wear around your ankle to provide support. If the elastic bandage is too tight (you have numbness or tingling in your foot or your foot becomes cold and blue), adjust the bandage to make it comfortable.   If you have an air splint, you may blow more air into it or let air out to make it more comfortable. You may take your splint off at night and before taking a shower or bath.   Wiggle your toes in the splint several times per day if you are able.  SEEK MEDICAL CARE IF:   You have an increase in bruising, swelling, or pain.   Your toes feel cold.   Pain relief is not achieved with medication.  SEEK IMMEDIATE MEDICAL CARE IF: Your toes are numb or blue or you have severe pain. MAKE SURE YOU:   Understand these instructions.   Will watch your condition.   Will get help right away if you are not doing well or get worse.  Document Released: 04/01/2005 Document Revised: 03/21/2011 Document Reviewed: 11/04/2007 [facility] Patient Information 2012 [place], [place].

## 2011-08-25 NOTE — ED Provider Notes (Signed)
Medical screening examination/treatment/procedure(s) were performed by non-physician practitioner and as supervising physician I was immediately available for consultation/collaboration.   Dayton Bailiff, MD 08/25/11 1027

## 2011-08-25 NOTE — ED Notes (Signed)
Pt was at work, fell and landed on right knee. Having knee pain and left ankle pain.

## 2011-09-06 ENCOUNTER — Encounter: Payer: Self-pay | Admitting: *Deleted

## 2011-09-06 ENCOUNTER — Emergency Department
Admission: EM | Admit: 2011-09-06 | Discharge: 2011-09-06 | Disposition: A | Discharge status: Home / Self Care | Payer: 59 | Source: Home / Self Care | Attending: Emergency Medicine | Admitting: Emergency Medicine

## 2011-09-06 DIAGNOSIS — J020 Streptococcal pharyngitis: Secondary | ICD-10-CM

## 2011-09-06 MED ORDER — PREDNISONE (PAK) 10 MG PO TABS: 10.0000 mg | ORAL_TABLET | Freq: Every day | ORAL | Status: AC

## 2011-09-06 MED ORDER — AMOXICILLIN 875 MG PO TABS: 875.0000 mg | ORAL_TABLET | Freq: Two times a day (BID) | ORAL | Status: AC

## 2011-09-06 NOTE — ED Notes (Signed)
Patient c/o sore throat x 4 days. Low grade temp. Dry cough x last night.

## 2011-09-06 NOTE — ED Provider Notes (Signed)
History     CSN: [id number]  Arrival date & time 09/06/11  1042   First MD Initiated Contact with Patient 09/06/11 1045      Chief Complaint  Patient presents with  . Sore Throat    (Consider location/radiation/quality/duration/timing/severity/associated sxs/prior treatment) HPI [name] is a 41 y.o. female who complains of onset of cold symptoms for 4 days.  The symptoms are constant and mild-moderate in severity.  + Sick contacts. ++ sore throat + cough (x1 day) No pleuritic pain No wheezing No nasal congestion No post-nasal drainage No sinus pain/pressure No chest congestion No itchy/red eyes No earache No hemoptysis No SOB No chills/sweats + fever No nausea No vomiting No abdominal pain No diarrhea No skin rashes No fatigue + myalgias + headache    Past Medical History  Diagnosis Date  . Kidney stones   . Asthma   . Depression   . Anxiety   . GERD (gastroesophageal reflux disease)   . Hypothyroidism   . Endometriosis   . History of sinus surgery 2010    MAXILLARY, ETHMOID, SPHENOID  . Obesity, Class III, BMI 40-49.9 (morbid obesity)   . SVD (spontaneous vaginal delivery)     x3    Past Surgical History  Procedure Date  . Knee surgery   . Nasal sinus surgery 2010  . Colposcopy 2007  . Laparoscopy 05/30/2011    Procedure: LAPAROSCOPY OPERATIVE;  Surgeon: Juluis Mire, MD;  Location: [facility];  Service: Gynecology;  Laterality: N/A;  YAG  LASER of Endometriosis.    Family History  Problem Relation Age of Onset  . Cancer Father     lung  . Alcohol abuse Father   . Cancer Other   . Depression Mother   . Depression Sister   . Suicidality Brother   . Depression Brother   . Alcohol abuse Brother   . Drug abuse Brother   . Depression Sister   . Anesthesia problems Neg Hx     History  Substance Use Topics  . Smoking status: Never Smoker   . Smokeless tobacco: Not on file  . Alcohol Use: No    OB History    Grav Para Term Preterm  Abortions TAB SAB Ect Mult Living   3 3 3       3       Review of Systems  All other systems reviewed and are negative.    Allergies  Clarithromycin and Moxifloxacin  Home Medications   Current Outpatient Rx  Name Route Sig Dispense Refill  . AMOXICILLIN 875 MG PO TABS Oral Take 1 tablet (875 mg total) by mouth 2 (two) times daily. 14 tablet 0  . CETIRIZINE-PSEUDOEPHEDRINE ER 5-120 MG PO TB12 Oral Take 1 tablet by mouth 2 (two) times daily. 60 tablet 11  . CLONAZEPAM 0.5 MG PO TABS Oral Take 1 tablet (0.5 mg total) by mouth 3 (three) times daily. For anxiety and ptsd 90 tablet 1  . ESOMEPRAZOLE MAGNESIUM 40 MG PO CPDR Oral Take 1 capsule (40 mg total) by mouth daily. 90 capsule 3  . FLUTICASONE-SALMETEROL 250-50 MCG/DOSE IN AEPB Inhalation Inhale 1 puff into the lungs 2 (two) times daily. 1 each 11  . LEVOTHYROXINE SODIUM 125 MCG PO TABS Oral Take 1 tablet (125 mcg total) by mouth daily. 30 tablet 11  . METAXALONE 800 MG PO TABS  Take one tab by mouth at bedtime as needed 12 tablet 0  . MOMETASONE FUROATE 50 MCG/ACT NA SUSP Nasal Place 2 sprays  into the nose daily. 17 g 12  . PREDNISONE (PAK) 10 MG PO TABS Oral Take 1 tablet (10 mg total) by mouth daily. 6 day pack, use as directed, Disp 1 pack 21 tablet 0  . SERTRALINE HCL 100 MG PO TABS Oral Take 2 tablets (200 mg total) by mouth daily. 60 tablet 1    BP 121/84  Pulse 80  Temp(Src) 98.5 F (36.9 C) (Oral)  Resp 14  Ht 5\' 4"  (1.626 m)  Wt 267 lb (121.11 kg)  BMI 45.83 kg/m2  SpO2 97%  LMP 09/04/2011  Physical Exam  Nursing note and vitals reviewed. Constitutional: She is oriented to person, place, and time. She appears well-developed and well-nourished.  HENT:  Head: Normocephalic and atraumatic.  Right Ear: Tympanic membrane, external ear and ear canal normal.  Left Ear: Tympanic membrane, external ear and ear canal normal.  Nose: Nose normal.  Mouth/Throat: Oropharyngeal exudate (minimal), posterior oropharyngeal  edema (tonsils 2+ bilateral) and posterior oropharyngeal erythema present.  Eyes: No scleral icterus.  Neck: Neck supple.  Cardiovascular: Regular rhythm and normal heart sounds.   Pulmonary/Chest: Effort normal. No respiratory distress. She has no decreased breath sounds. She has no wheezes.  Neurological: She is alert and oriented to person, place, and time.  Skin: Skin is warm and dry.  Psychiatric: She has a normal mood and affect. Her speech is normal.    ED Course  Procedures (including critical care time)  Labs Reviewed  POCT RAPID STREP A (OFFICE) - Abnormal; Notable for the following:    Rapid Strep A Screen Positive (*)    All other components within normal limits   No results found.   1. Strep pharyngitis       MDM  1)  Take the prescribed antibiotic as instructed.  Contagious for 24 hours.  Change toothbrush. 2)  Use nasal saline solution (over the counter) at least 3 times a day. 3)  Can take tylenol every 6 hours or motrin every 8 hours for pain or fever. 4)  Follow up with your primary doctor if no improvement in 5-7 days, sooner if increasing pain, fever, or new symptoms.        Marlaine Hind, MD 09/06/11 [phone]

## 2011-09-18 ENCOUNTER — Encounter: Payer: Self-pay | Admitting: Internal Medicine

## 2011-09-18 ENCOUNTER — Ambulatory Visit (INDEPENDENT_AMBULATORY_CARE_PROVIDER_SITE_OTHER): Payer: 59 | Admitting: Internal Medicine

## 2011-09-18 VITALS — BP 122/80 | HR 101 | Temp 98.5°F | Resp 16 | Wt 265.0 lb

## 2011-09-18 DIAGNOSIS — J209 Acute bronchitis, unspecified: Secondary | ICD-10-CM

## 2011-09-18 DIAGNOSIS — J45909 Unspecified asthma, uncomplicated: Secondary | ICD-10-CM

## 2011-09-18 MED ORDER — METHYLPREDNISOLONE ACETATE 80 MG/ML IJ SUSP
120.0000 mg | Freq: Once | INTRAMUSCULAR | Status: AC
  Administered 2011-09-18: 120 mg via INTRAMUSCULAR

## 2011-09-18 MED ORDER — GUAIFENESIN-CODEINE 100-10 MG/5ML PO SYRP: 5.0000 mL | ORAL_SOLUTION | Freq: Three times a day (TID) | ORAL | Status: DC | PRN

## 2011-09-18 MED ORDER — AMOXICILLIN-POT CLAVULANATE 875-125 MG PO TABS: 1.0000 | ORAL_TABLET | Freq: Two times a day (BID) | ORAL | Status: AC

## 2011-09-18 NOTE — Assessment & Plan Note (Signed)
She was given an injection of depo-medrol for symptom relief and to prevent complications like ER visit and possible need for admission and intubation.

## 2011-09-18 NOTE — Patient Instructions (Signed)
Asthma, Adult Asthma is caused by narrowing of the air passages in the lungs. It may be triggered by pollen, dust, animal dander, molds, some foods, respiratory infections, exposure to smoke, exercise, emotional stress or other allergens (things that cause allergic reactions or allergies). Repeat attacks are common. HOME CARE INSTRUCTIONS   Use prescription medications as ordered by your caregiver.   Avoid pollen, dust, animal dander, molds, smoke and other things that cause attacks at home and at work.   You may have fewer attacks if you decrease dust in your home. Electrostatic air cleaners may help.   It may help to replace your pillows or mattress with materials less likely to cause allergies.   Talk to your caregiver about an action plan for managing asthma attacks at home, including, the use of a peak flow meter which measures the severity of your asthma attack. An action plan can help minimize or stop the attack without having to seek medical care.   If you are not on a fluid restriction, drink 8 to 10 glasses of water each day.   Always have a plan prepared for seeking medical attention, including, calling your physician, accessing local emergency care, and calling 911 (in the [place] for a severe attack.   Discuss possible exercise routines with your caregiver.   If animal dander is the cause of asthma, you may need to get rid of pets.  SEEK MEDICAL CARE IF:   You have wheezing and shortness of breath even if taking medicine to prevent attacks.   You have muscle aches, chest pain or thickening of sputum.   Your sputum changes from clear or white to yellow, green, gray, or bloody.   You have any problems that may be related to the medicine you are taking (such as a rash, itching, swelling or trouble breathing).  SEEK IMMEDIATE MEDICAL CARE IF:   Your usual medicines do not stop your wheezing or there is increased coughing and/or shortness of breath.   You have increased  difficulty breathing.   You have a fever.  MAKE SURE YOU:   Understand these instructions.   Will watch your condition.   Will get help right away if you are not doing well or get worse.  Document Released: 04/01/2005 Document Revised: 03/21/2011 Document Reviewed: 11/18/2007 Exit[employer] Patient Information 2012 [employer].Acute Bronchitis You have acute bronchitis. This means you have a chest cold. The airways in your lungs are red and sore (inflamed). Acute means it is sudden onset.  CAUSES Bronchitis is most often caused by the same virus that causes a cold. SYMPTOMS   Body aches.   Chest congestion.   Chills.   Cough.   Fever.   Shortness of breath.   Sore throat.  TREATMENT  Acute bronchitis is usually treated with rest, fluids, and medicines for relief of fever or cough. Most symptoms should go away after a few days or a week. Increased fluids may help thin your secretions and will prevent dehydration. Your caregiver may give you an inhaler to improve your symptoms. The inhaler reduces shortness of breath and helps control cough. You can take over-the-counter pain relievers or cough medicine to decrease coughing, pain, or fever. A cool-air vaporizer may help thin bronchial secretions and make it easier to clear your chest. Antibiotics are usually not needed but can be prescribed if you smoke, are seriously ill, have chronic lung problems, are elderly, or you are at higher risk for developing complications.Allergies and asthma can make bronchitis worse.   Repeated episodes of bronchitis may cause longstanding lung problems. Avoid smoking and secondhand smoke.Exposure to cigarette smoke or irritating chemicals will make bronchitis worse. If you are a cigarette smoker, consider using nicotine gum or skin patches to help control withdrawal symptoms. Quitting smoking will help your lungs heal faster. Recovery from bronchitis is often slow, but you should start feeling better  after 2 to 3 days. Cough from bronchitis frequently lasts for 3 to 4 weeks. To prevent another bout of acute bronchitis:  Quit smoking.   Wash your hands frequently to get rid of viruses or use a hand sanitizer.   Avoid other people with cold or virus symptoms.   Try not to touch your hands to your mouth, nose, or eyes.  SEEK IMMEDIATE MEDICAL CARE IF:  You develop increased fever, chills, or chest pain.   You have severe shortness of breath or bloody sputum.   You develop dehydration, fainting, repeated vomiting, or a severe headache.   You have no improvement after 1 week of treatment or you get worse.  MAKE SURE YOU:   Understand these instructions.   Will watch your condition.   Will get help right away if you are not doing well or get worse.  Document Released: 05/09/2004 Document Revised: 03/21/2011 Document Reviewed: 07/25/2010 [employer] Patient Information 2012 [employer]. 

## 2011-09-18 NOTE — Progress Notes (Signed)
Subjective:    Patient ID: [name], female    DOB: 12/03/1970, 41 y.o.   MRN: [id number]  Cough This is a new problem. The current episode started in the past 7 days. The problem has been gradually worsening. The problem occurs every few hours. The cough is productive of purulent sputum. Associated symptoms include chills, a sore throat, sweats and wheezing. Pertinent negatives include no chest pain, ear congestion, ear pain, fever, headaches, heartburn, hemoptysis, myalgias, nasal congestion, postnasal drip, rash, rhinorrhea, shortness of breath or weight loss. The symptoms are aggravated by nothing. She has tried a beta-agonist inhaler and steroid inhaler for the symptoms. The treatment provided moderate relief. Her past medical history is significant for asthma and environmental allergies.      Review of Systems  Constitutional: Positive for chills. Negative for fever, weight loss, diaphoresis, activity change, appetite change, fatigue and unexpected weight change.  HENT: Positive for sore throat. Negative for ear pain, facial swelling, rhinorrhea, mouth sores, trouble swallowing, neck pain, voice change, postnasal drip and sinus pressure.   Eyes: Negative.   Respiratory: Positive for cough and wheezing. Negative for apnea, hemoptysis, choking, chest tightness, shortness of breath and stridor.   Cardiovascular: Negative for chest pain, palpitations and leg swelling.  Gastrointestinal: Negative for heartburn, nausea, vomiting, abdominal pain, diarrhea, constipation, blood in stool and abdominal distention.  Genitourinary: Negative.   Musculoskeletal: Negative for myalgias, back pain, joint swelling, arthralgias and gait problem.  Skin: Negative for color change, pallor, rash and wound.  Neurological: Negative for dizziness, tremors, seizures, syncope, facial asymmetry, speech difficulty, weakness, light-headedness, numbness and headaches.  Hematological: Positive for environmental  allergies. Negative for adenopathy. Does not bruise/bleed easily.  Psychiatric/Behavioral: Negative.        Objective:   Physical Exam  Vitals reviewed. Constitutional: She is oriented to person, place, and time. She appears well-developed and well-nourished. No distress.  HENT:  Head: Normocephalic and atraumatic.  Mouth/Throat: Oropharynx is clear and moist. No oropharyngeal exudate.  Eyes: Conjunctivae are normal. Right eye exhibits no discharge. Left eye exhibits no discharge. No scleral icterus.  Neck: Normal range of motion. Neck supple. No JVD present. No tracheal deviation present. No thyromegaly present.  Cardiovascular: Normal rate, regular rhythm, normal heart sounds and intact distal pulses.  Exam reveals no gallop and no friction rub.   No murmur heard. Pulmonary/Chest: Effort normal. No accessory muscle usage or stridor. Not tachypneic. No respiratory distress. She has no decreased breath sounds. She has no wheezes. She has rhonchi in the right upper field and the left upper field. She has no rales. She exhibits no tenderness.  Abdominal: Soft. Bowel sounds are normal. She exhibits no distension and no mass. There is no tenderness. There is no rebound and no guarding.  Musculoskeletal: Normal range of motion. She exhibits no edema and no tenderness.  Lymphadenopathy:    She has no cervical adenopathy.  Neurological: She is oriented to person, place, and time.  Skin: Skin is warm and dry. No rash noted. She is not diaphoretic. No erythema. No pallor.  Psychiatric: She has a normal mood and affect. Her behavior is normal. Judgment and thought content normal.      Lab Results  Component Value Date   WBC 8.3 06/05/2011   HGB 11.0* 06/05/2011   HCT 32.9* 06/05/2011   PLT 252 06/05/2011   GLUCOSE 109* 03/22/2011   CHOL 198 03/22/2011   TRIG 74.0 03/22/2011   HDL 61.20 03/22/2011   LDLCALC 122* 03/22/2011  ALT 28 03/22/2011   AST 24 03/22/2011   NA 140 03/22/2011   K 3.6  03/22/2011   CL 104 03/22/2011   CREATININE 0.8 03/22/2011   BUN 11 03/22/2011   CO2 30 03/22/2011   TSH 14.29* 03/22/2011      Assessment & Plan:

## 2011-09-18 NOTE — Assessment & Plan Note (Signed)
Start augmentin for the infection and a cough suppressant 

## 2011-09-24 ENCOUNTER — Encounter: Payer: Self-pay | Admitting: Internal Medicine

## 2011-09-24 ENCOUNTER — Other Ambulatory Visit (INDEPENDENT_AMBULATORY_CARE_PROVIDER_SITE_OTHER): Payer: 59

## 2011-09-24 ENCOUNTER — Ambulatory Visit (INDEPENDENT_AMBULATORY_CARE_PROVIDER_SITE_OTHER): Payer: 59 | Admitting: Internal Medicine

## 2011-09-24 VITALS — BP 118/76 | HR 80 | Temp 98.1°F | Resp 20 | Wt 264.0 lb

## 2011-09-24 DIAGNOSIS — E039 Hypothyroidism, unspecified: Secondary | ICD-10-CM

## 2011-09-24 DIAGNOSIS — J209 Acute bronchitis, unspecified: Secondary | ICD-10-CM

## 2011-09-24 LAB — TSH: TSH: 11.32 u[IU]/mL — ABNORMAL HIGH (ref 0.35–5.50)

## 2011-09-24 MED ORDER — HYDROCODONE-HOMATROPINE 5-1.5 MG/5ML PO SYRP: 5.0000 mL | ORAL_SOLUTION | Freq: Three times a day (TID) | ORAL | Status: AC | PRN

## 2011-09-24 MED ORDER — LEVOTHYROXINE SODIUM 150 MCG PO TABS: 150.0000 ug | ORAL_TABLET | Freq: Every day | ORAL | Status: DC

## 2011-09-24 NOTE — Progress Notes (Signed)
  Subjective:    Patient ID: [name], female    DOB: 1970/09/20, 41 y.o.   MRN: [id number]  Cough This is a recurrent problem. The current episode started 1 to 4 weeks ago. The problem has been gradually improving. The problem occurs every few hours. The cough is productive of sputum (white/clear phlegm). Pertinent negatives include no chest pain, chills, ear congestion, ear pain, fever, headaches, heartburn, hemoptysis, myalgias, nasal congestion, postnasal drip, rash, rhinorrhea, sore throat, shortness of breath, sweats, weight loss or wheezing. She has tried prescription cough suppressant, steroid inhaler and a beta-agonist inhaler for the symptoms. The treatment provided moderate relief. Her past medical history is significant for asthma and bronchitis.      Review of Systems  Constitutional: Negative for fever, chills, weight loss, diaphoresis, activity change, appetite change, fatigue and unexpected weight change.  HENT: Negative.  Negative for ear pain, sore throat, rhinorrhea and postnasal drip.   Eyes: Negative.   Respiratory: Positive for cough. Negative for apnea, hemoptysis, choking, chest tightness, shortness of breath, wheezing and stridor.   Cardiovascular: Negative for chest pain and leg swelling.  Gastrointestinal: Negative for heartburn, nausea, vomiting, abdominal pain, diarrhea, constipation and anal bleeding.  Genitourinary: Negative.   Musculoskeletal: Negative for myalgias, back pain, joint swelling, arthralgias and gait problem.  Skin: Negative for color change, pallor, rash and wound.  Neurological: Negative.  Negative for headaches.  Hematological: Negative for adenopathy. Does not bruise/bleed easily.  Psychiatric/Behavioral: Negative.        Objective:   Physical Exam  Vitals reviewed. Constitutional: She is oriented to person, place, and time. She appears well-developed and well-nourished. No distress.  HENT:  Head: Normocephalic and atraumatic.    Mouth/Throat: Oropharynx is clear and moist. No oropharyngeal exudate.  Eyes: Conjunctivae are normal. Right eye exhibits no discharge. Left eye exhibits no discharge. No scleral icterus.  Neck: Normal range of motion. Neck supple. No JVD present. No tracheal deviation present. No thyromegaly present.  Cardiovascular: Normal rate, regular rhythm, normal heart sounds and intact distal pulses.  Exam reveals no gallop and no friction rub.   No murmur heard. Pulmonary/Chest: Effort normal and breath sounds normal. No stridor. No respiratory distress. She has no wheezes. She has no rales. She exhibits no tenderness.  Abdominal: Soft. Bowel sounds are normal. She exhibits no distension and no mass. There is no tenderness. There is no rebound and no guarding.  Musculoskeletal: Normal range of motion. She exhibits no edema and no tenderness.  Lymphadenopathy:    She has no cervical adenopathy.  Neurological: She is oriented to person, place, and time.  Skin: Skin is warm and dry. No rash noted. She is not diaphoretic. No erythema. No pallor.  Psychiatric: She has a normal mood and affect. Her behavior is normal. Judgment and thought content normal.      Lab Results  Component Value Date   WBC 8.3 06/05/2011   HGB 11.0* 06/05/2011   HCT 32.9* 06/05/2011   PLT 252 06/05/2011   GLUCOSE 109* 03/22/2011   CHOL 198 03/22/2011   TRIG 74.0 03/22/2011   HDL 61.20 03/22/2011   LDLCALC 122* 03/22/2011   ALT 28 03/22/2011   AST 24 03/22/2011   NA 140 03/22/2011   K 3.6 03/22/2011   CL 104 03/22/2011   CREATININE 0.8 03/22/2011   BUN 11 03/22/2011   CO2 30 03/22/2011   TSH 14.29* 03/22/2011      Assessment & Plan:

## 2011-09-24 NOTE — Progress Notes (Signed)
Addended by: Etta Grandchild on: 09/24/2011 06:26 PM   Modules accepted: Orders

## 2011-09-24 NOTE — Assessment & Plan Note (Signed)
She has a persistent cough but no other s/s of infection or wheezing, I have asked her to stay on augmentin and I will offer her better symptom relief with hycodan cough syrup

## 2011-09-24 NOTE — Assessment & Plan Note (Signed)
I will recheck her TSH today 

## 2011-09-24 NOTE — Patient Instructions (Signed)

## 2011-10-07 ENCOUNTER — Ambulatory Visit (HOSPITAL_COMMUNITY): Payer: Self-pay | Admitting: Psychiatry

## 2011-10-08 ENCOUNTER — Encounter (HOSPITAL_BASED_OUTPATIENT_CLINIC_OR_DEPARTMENT_OTHER): Payer: Self-pay | Admitting: *Deleted

## 2011-10-08 ENCOUNTER — Emergency Department (HOSPITAL_BASED_OUTPATIENT_CLINIC_OR_DEPARTMENT_OTHER)
Admission: EM | Admit: 2011-10-08 | Discharge: 2011-10-08 | Disposition: A | Discharge status: Home / Self Care | Payer: Self-pay | Attending: Emergency Medicine | Admitting: Emergency Medicine

## 2011-10-08 ENCOUNTER — Ambulatory Visit (HOSPITAL_COMMUNITY): Payer: Self-pay | Admitting: Psychiatry

## 2011-10-08 DIAGNOSIS — N809 Endometriosis, unspecified: Secondary | ICD-10-CM | POA: Insufficient documentation

## 2011-10-08 DIAGNOSIS — R102 Pelvic and perineal pain: Secondary | ICD-10-CM

## 2011-10-08 DIAGNOSIS — E039 Hypothyroidism, unspecified: Secondary | ICD-10-CM | POA: Insufficient documentation

## 2011-10-08 DIAGNOSIS — K219 Gastro-esophageal reflux disease without esophagitis: Secondary | ICD-10-CM | POA: Insufficient documentation

## 2011-10-08 DIAGNOSIS — N8000 Endometriosis of the uterus, unspecified: Secondary | ICD-10-CM | POA: Insufficient documentation

## 2011-10-08 LAB — URINALYSIS, ROUTINE W REFLEX MICROSCOPIC
Bilirubin Urine: NEGATIVE
Hgb urine dipstick: NEGATIVE
Nitrite: NEGATIVE
Specific Gravity, Urine: 1.02 (ref 1.005–1.030)
pH: 6.5 (ref 5.0–8.0)

## 2011-10-08 LAB — URINE MICROSCOPIC-ADD ON

## 2011-10-08 LAB — PREGNANCY, URINE: Preg Test, Ur: NEGATIVE

## 2011-10-08 MED ORDER — KETOROLAC TROMETHAMINE 60 MG/2ML IM SOLN
60.0000 mg | Freq: Once | INTRAMUSCULAR | Status: AC
  Administered 2011-10-08: 60 mg via INTRAMUSCULAR
  Filled 2011-10-08: qty 2

## 2011-10-08 MED ORDER — ONDANSETRON 8 MG PO TBDP
8.0000 mg | ORAL_TABLET | Freq: Once | ORAL | Status: AC
  Administered 2011-10-08: 8 mg via ORAL
  Filled 2011-10-08: qty 1

## 2011-10-08 MED ORDER — OXYCODONE-ACETAMINOPHEN 5-325 MG PO TABS: 1.0000 | ORAL_TABLET | Freq: Four times a day (QID) | ORAL | Status: AC | PRN

## 2011-10-08 MED ORDER — HYDROMORPHONE HCL PF 2 MG/ML IJ SOLN
2.0000 mg | Freq: Once | INTRAMUSCULAR | Status: AC
  Administered 2011-10-08: 2 mg via INTRAMUSCULAR
  Filled 2011-10-08: qty 1

## 2011-10-08 NOTE — Discharge Instructions (Signed)
Endometriosis Endometriosis is a disease that occurs when the endometrium (lining of the uterus) is misplaced outside of its normal location. It may occur in many locations close to the uterus (womb), but commonly on the ovaries, fallopian tubes, vagina (birth canal) and bowel located close to the uterus. Because the uterus sloughs (expels) its lining every month (menses), there is bleeding whereever the endometrial tissue is located. SYMPTOMS  Often there are no symptoms. However, because blood is irritating to tissues not normally exposed to it, when symptoms occur they vary with the location of the misplaced endometrium. Symptoms often include back and abdominal pain. Periods may be heavier and intercourse may be painful. Infertility may be present. You may have all of these symptoms at one time or another or you may have months with no symptoms at all. Although the symptoms occur mainly during menses, they can occur mid-cycle as well, and usually terminate with menopause. DIAGNOSIS  Your caregiver may recommend a blood test and urine test (urinalysis) to help rule out other conditions. Another common test is ultrasound, a painless procedure that uses sound waves to make a sonogram "picture" of abnormal tissue that could be endometriosis. If your bowel movements are painful around your periods, your caregiver may advise a barium enema (an X-ray of the lower bowel), to try to find the source of your pain. This is sometimes confirmed by laparoscopy. Laparoscopy is a procedure where your caregiver looks into your abdomen with a laparoscope (a small pencil sized telescope). Your caregiver may take a tiny piece of tissue (biopsy) from any abnormal tissue to confirm or document your problem. These tissues are sent to the lab and a pathologist looks at them under the microscope to give a microscopic diagnosis. TREATMENT  Once the diagnosis is made, it can be treated by destruction of the misplaced endometrial  tissue using heat (diathermy), laser, cutting (excision), or chemical means. It may also be treated with hormonal therapy. When using hormonal therapy menses are eliminated, therefore eliminating the monthly exposure to blood by the misplaced endometrial tissue. Only in severe cases is it necessary to perform a hysterectomy with removal of the tubes, uterus and ovaries. HOME CARE INSTRUCTIONS   Only take over-the-counter or prescription medicines for pain, discomfort, or fever as directed by your caregiver.   Avoid activities that produce pain, including a physical sexual relationship.   Do not take aspirin as this may increase bleeding when not on hormonal therapy.   See your caregiver for pain or problems not controlled with treatment.  SEEK IMMEDIATE MEDICAL CARE IF:   Your pain is severe and is not responding to pain medicine.   You develop severe nausea and vomiting, or you cannot keep foods down.   Your pain localizes to the right lower part of your abdomen (possible appendicitis).   You have swelling or increasing pain in the abdomen.   You have a fever.   You see blood in your stool.  MAKE SURE YOU:   Understand these instructions.   Will watch your condition.   Will get help right away if you are not doing well or get worse.  Document Released: 03/29/2000 Document Revised: 03/21/2011 Document Reviewed: 11/18/2007 [employer] Patient Information 2012 [employer]. 

## 2011-10-08 NOTE — ED Provider Notes (Addendum)
History     CSN: [id number]  Arrival date & time 10/08/11  1458   First MD Initiated Contact with Patient 10/08/11 1524      Chief Complaint  Patient presents with  . Pelvic Pain    (Consider location/radiation/quality/duration/timing/severity/associated sxs/prior treatment) HPI Comments: Patient with a history of endometriosis and adenomyosis who has been having painful periods for years. 3 months ago she went on a low dose estrogen to help the pain however it is not improving even though her menses have ceased. The pain is 9/10 and sharp in nature. It occasionally causes vomiting and no fever. The pain is in the same spot it always is and nothing makes it better. She's been taking tramadol without improvement.  Patient is a 41 y.o. female presenting with pelvic pain. The history is provided by the patient.  Pelvic Pain This is a chronic problem. The current episode started yesterday. The problem occurs constantly. The problem has not changed since onset.Associated symptoms include abdominal pain. The symptoms are aggravated by intercourse. Nothing relieves the symptoms. She has tried acetaminophen (tramadol, low dose estrogen) for the symptoms. The treatment provided no relief.    Past Medical History  Diagnosis Date  . Kidney stones   . Asthma   . Depression   . Anxiety   . GERD (gastroesophageal reflux disease)   . Hypothyroidism   . Endometriosis   . History of sinus surgery 2010    MAXILLARY, ETHMOID, SPHENOID  . Obesity, Class III, BMI 40-49.9 (morbid obesity)   . SVD (spontaneous vaginal delivery)     x3    Past Surgical History  Procedure Date  . Knee surgery   . Nasal sinus surgery 2010  . Colposcopy 2007  . Laparoscopy 05/30/2011    Procedure: LAPAROSCOPY OPERATIVE;  Surgeon: Juluis Mire, MD;  Location: [facility];  Service: Gynecology;  Laterality: N/A;  YAG  LASER of Endometriosis.    Family History  Problem Relation Age of Onset  . Cancer Father     lung    . Alcohol abuse Father   . Cancer Other   . Depression Mother   . Depression Sister   . Suicidality Brother   . Depression Brother   . Alcohol abuse Brother   . Drug abuse Brother   . Depression Sister   . Anesthesia problems Neg Hx     History  Substance Use Topics  . Smoking status: Never Smoker   . Smokeless tobacco: Not on file  . Alcohol Use: No    OB History    Grav Para Term Preterm Abortions TAB SAB Ect Mult Living   3 3 3       3       Review of Systems  Constitutional: Negative for fever.  Gastrointestinal: Positive for nausea, vomiting and abdominal pain.  Genitourinary: Positive for pelvic pain. Negative for dysuria, urgency, frequency, flank pain, vaginal bleeding and vaginal discharge.  All other systems reviewed and are negative.    Allergies  Clarithromycin and Moxifloxacin  Home Medications   Current Outpatient Rx  Name Route Sig Dispense Refill  . CETIRIZINE HCL 10 MG PO TABS Oral Take 10 mg by mouth daily. Patient uses this for her allergies.    Marland Kitchen CLONAZEPAM 0.5 MG PO TABS Oral Take 1 tablet (0.5 mg total) by mouth 3 (three) times daily. For anxiety and ptsd 90 tablet 1  . ESOMEPRAZOLE MAGNESIUM 40 MG PO CPDR Oral Take 1 capsule (40 mg total) by mouth  daily. 90 capsule 3  . LEVOTHYROXINE SODIUM 150 MCG PO TABS Oral Take 1 tablet (150 mcg total) by mouth daily. 90 tablet 1  . NORETHIN-ETH ESTRAD-FE BIPHAS 1 MG-10 MCG / 10 MCG PO TABS Oral Take 1 tablet by mouth daily. 1 Package 11  . SERTRALINE HCL 100 MG PO TABS Oral Take 2 tablets (200 mg total) by mouth daily. 60 tablet 1  . TRAMADOL HCL 50 MG PO TABS Oral Take 50 mg by mouth every 6 (six) hours as needed. Patient uses this medication for pain.      BP 166/103  Pulse 101  Temp 98.6 F (37 C) (Oral)  Resp 20  SpO2 100%  LMP 09/03/2011  Physical Exam  Nursing note and vitals reviewed. Constitutional: She is oriented to person, place, and time. She appears well-developed and  well-nourished. No distress.  HENT:  Head: Normocephalic and atraumatic.  Mouth/Throat: Oropharynx is clear and moist.  Eyes: Conjunctivae and EOM are normal. Pupils are equal, round, and reactive to light.  Neck: Normal range of motion. Neck supple.  Cardiovascular: Normal rate, regular rhythm and intact distal pulses.   No murmur heard. Pulmonary/Chest: Effort normal and breath sounds normal. No respiratory distress. She has no wheezes. She has no rales.  Abdominal: Soft. Normal appearance. She exhibits no distension. There is tenderness. There is guarding. There is no rebound and no CVA tenderness. No hernia.    Musculoskeletal: Normal range of motion. She exhibits no edema and no tenderness.  Neurological: She is alert and oriented to person, place, and time.  Skin: Skin is warm and dry. No rash noted. No erythema.  Psychiatric: She has a normal mood and affect. Her behavior is normal.    ED Course  Procedures (including critical care time)  Labs Reviewed  URINALYSIS, ROUTINE W REFLEX MICROSCOPIC - Abnormal; Notable for the following:    Leukocytes, UA SMALL (*)     All other components within normal limits  PREGNANCY, URINE  URINE MICROSCOPIC-ADD ON   No results found.   1. Pelvic pain   2. Endometriosis   3. Adenomyosis       MDM   Patient with a history of endometriosis and adenomyosis who is currently seeing in OB/GYN and prescribed a low dose estrogen 3 months ago to help with her pain. However she states the pain is not improving even though her menses has ceased. She had a laparoscopy done in February which showed the adenomyosis meaning that she will need a hysterectomy however she does not have much time built up at work. Patient comes in today with her same pain that she has every month in her pelvic region. She denies any dysuria, vaginal discharge or she change in sexual partners. She is afebrile but does vomit intermittently due to the pain. She tried to  contact her doctor as the tramadol was not working but was unable to hold them. Other than being hypertensive here her vital signs are stable she has diffuse lower quadrant abdominal pain but no symptoms suggestive of appendicitis or diverticulitis at this time. Will treat her pain and have her followup with her PCP. UA with small leukocytes and 3-6 white blood cells however this was a clean-catch and patient denies any urinary tract symptoms. Culture was sent.  4:23 PM Pain improving and pt d/ced home.        Gwyneth Sprout, MD 10/08/11 1552  Gwyneth Sprout, MD 10/08/11 1553  Gwyneth Sprout, MD 10/08/11 [address]  MD 10/08/11 1626

## 2011-10-08 NOTE — ED Notes (Signed)
Pelvic pain since yesterday. Hx of endometriosis. Nausea.

## 2011-10-08 NOTE — ED Notes (Signed)
Patient states this pain is her typical endometriosis pain but worse.  Currently vomiting from pain.  Denies any Vaginal discharge.

## 2011-10-25 ENCOUNTER — Encounter: Payer: Self-pay | Admitting: Emergency Medicine

## 2011-10-25 ENCOUNTER — Emergency Department
Admission: EM | Admit: 2011-10-25 | Discharge: 2011-10-25 | Disposition: A | Discharge status: Home / Self Care | Payer: 59 | Source: Home / Self Care

## 2011-10-25 DIAGNOSIS — G43909 Migraine, unspecified, not intractable, without status migrainosus: Secondary | ICD-10-CM

## 2011-10-25 MED ORDER — BUTALBITAL-APAP-CAFFEINE 50-325-40 MG PO TABS: 1.0000 | ORAL_TABLET | Freq: Four times a day (QID) | ORAL | Status: DC | PRN

## 2011-10-25 MED ORDER — DEXAMETHASONE SODIUM PHOSPHATE 10 MG/ML IJ SOLN
8.0000 mg | Freq: Once | INTRAMUSCULAR | Status: AC
  Administered 2011-10-25: 8 mg via INTRAMUSCULAR

## 2011-10-25 MED ORDER — PROMETHAZINE HCL 25 MG/ML IJ SOLN
25.0000 mg | Freq: Once | INTRAMUSCULAR | Status: AC
  Administered 2011-10-25: 25 mg via INTRAMUSCULAR

## 2011-10-25 NOTE — ED Provider Notes (Signed)
History     CSN: [id number]  Arrival date & time 10/25/11  1312   First MD Initiated Contact with Patient 10/25/11 1325      Chief Complaint  Patient presents with  . Migraine    (Consider location/radiation/quality/duration/timing/severity/associated sxs/prior treatment) HPI Comments: Pt reports prior history of recurrent migraines.  Has formally been seen by neurology for this in the past. Has been tried on multiple medications for headache including triptans. Only effective medication has been fioricet.  Has been under a lot of stress pending hysterectomy.  Headache predominantly L sided frontal.  Associated with nausea, throbbing, and aura (bright light prior to migraine).  This is usual pattern.  No fevers, chills, vomiting, hemiparesis, or confusion.    Patient is a 41 y.o. female presenting with migraine.  Migraine This is a recurrent problem. Episode onset: x 2 days  The problem occurs constantly.    Past Medical History  Diagnosis Date  . Kidney stones   . Asthma   . Depression   . Anxiety   . GERD (gastroesophageal reflux disease)   . Hypothyroidism   . Endometriosis   . History of sinus surgery 2010    MAXILLARY, ETHMOID, SPHENOID  . Obesity, Class III, BMI 40-49.9 (morbid obesity)   . SVD (spontaneous vaginal delivery)     x3    Past Surgical History  Procedure Date  . Knee surgery   . Nasal sinus surgery 2010  . Colposcopy 2007  . Laparoscopy 05/30/2011    Procedure: LAPAROSCOPY OPERATIVE;  Surgeon: Juluis Mire, MD;  Location: [facility];  Service: Gynecology;  Laterality: N/A;  YAG  LASER of Endometriosis.    Family History  Problem Relation Age of Onset  . Cancer Father     lung  . Alcohol abuse Father   . Cancer Other   . Depression Mother   . Depression Sister   . Suicidality Brother   . Depression Brother   . Alcohol abuse Brother   . Drug abuse Brother   . Depression Sister   . Anesthesia problems Neg Hx     History  Substance Use  Topics  . Smoking status: Never Smoker   . Smokeless tobacco: Not on file  . Alcohol Use: No    OB History    Grav Para Term Preterm Abortions TAB SAB Ect Mult Living   3 3 3       3       Review of Systems  All other systems reviewed and are negative.    Allergies  Clarithromycin and Moxifloxacin  Home Medications   Current Outpatient Rx  Name Route Sig Dispense Refill  . BUTALBITAL-APAP-CAFFEINE 50-325-40 MG PO TABS Oral Take 1 tablet by mouth every 6 (six) hours as needed for headache. 20 tablet 0  . CETIRIZINE HCL 10 MG PO TABS Oral Take 10 mg by mouth daily. Patient uses this for her allergies.    Marland Kitchen CLONAZEPAM 0.5 MG PO TABS Oral Take 1 tablet (0.5 mg total) by mouth 3 (three) times daily. For anxiety and ptsd 90 tablet 1  . ESOMEPRAZOLE MAGNESIUM 40 MG PO CPDR Oral Take 1 capsule (40 mg total) by mouth daily. 90 capsule 3  . LEVOTHYROXINE SODIUM 150 MCG PO TABS Oral Take 1 tablet (150 mcg total) by mouth daily. 90 tablet 1  . NORETHIN-ETH ESTRAD-FE BIPHAS 1 MG-10 MCG / 10 MCG PO TABS Oral Take 1 tablet by mouth daily. 1 Package 11  . SERTRALINE HCL 100 MG  PO TABS Oral Take 2 tablets (200 mg total) by mouth daily. 60 tablet 1  . TRAMADOL HCL 50 MG PO TABS Oral Take 50 mg by mouth every 6 (six) hours as needed. Patient uses this medication for pain.      BP 124/92  Pulse 83  Temp 98.3 F (36.8 C) (Oral)  Resp 18  Ht 5\' 4"  (1.626 m)  Wt 258 lb (117.028 kg)  BMI 44.29 kg/m2  SpO2 97%  LMP 09/03/2011  Physical Exam  Constitutional: She appears well-developed and well-nourished.       Obese   HENT:  Head: Normocephalic and atraumatic.  Eyes: Conjunctivae are normal. Pupils are equal, round, and reactive to light.       + photophobia bilaterally No papilledema on funduscopic exam.   Neck: Normal range of motion. Neck supple.  Cardiovascular: Normal rate and regular rhythm.   Pulmonary/Chest: Effort normal.  Abdominal: Soft.  Neurological: She is alert.  Skin:  Skin is warm.    ED Course  Procedures (including critical care time)  Labs Reviewed - No data to display No results found.   No diagnosis found.    MDM  Exam and history consistent with classic migraine.  Decadron 8mg  IM x 1  Phenergan 25mg  IM x 1 Will rx fioricet in the interim.  Discussed neuro red flags.  Follow up with PCP Handout given    The patient and/or caregiver has been counseled thoroughly with regard to treatment plan and/or medications prescribed including dosage, schedule, interactions, rationale for use, and possible side effects and they verbalize understanding. Diagnoses and expected course of recovery discussed and will return if not improved as expected or if the condition worsens. Patient and/or caregiver verbalized understanding.     Update: headache mildly improved s/p decadron and phenergan. Pt feels comfortable going home to care of husband and treatment plan.        Floydene Flock, MD 10/25/11 [phone]

## 2011-10-25 NOTE — ED Provider Notes (Signed)
Agree with exam, assessment, and plan.   Lattie Haw, MD 10/25/11 2240

## 2011-10-25 NOTE — ED Notes (Signed)
Has had migraine headache x 2 days; no OTC has alveated it. In past used Esgic Plus, but rx has expired.

## 2011-10-28 ENCOUNTER — Ambulatory Visit: Payer: Self-pay | Admitting: Internal Medicine

## 2011-10-28 ENCOUNTER — Encounter (HOSPITAL_BASED_OUTPATIENT_CLINIC_OR_DEPARTMENT_OTHER): Payer: Self-pay

## 2011-10-28 ENCOUNTER — Emergency Department (HOSPITAL_BASED_OUTPATIENT_CLINIC_OR_DEPARTMENT_OTHER)
Admission: EM | Admit: 2011-10-28 | Discharge: 2011-10-28 | Disposition: A | Discharge status: Home / Self Care | Payer: 59 | Attending: Emergency Medicine | Admitting: Emergency Medicine

## 2011-10-28 ENCOUNTER — Telehealth: Payer: Self-pay | Admitting: *Deleted

## 2011-10-28 DIAGNOSIS — F411 Generalized anxiety disorder: Secondary | ICD-10-CM | POA: Insufficient documentation

## 2011-10-28 DIAGNOSIS — G43909 Migraine, unspecified, not intractable, without status migrainosus: Secondary | ICD-10-CM

## 2011-10-28 DIAGNOSIS — K219 Gastro-esophageal reflux disease without esophagitis: Secondary | ICD-10-CM | POA: Insufficient documentation

## 2011-10-28 DIAGNOSIS — F329 Major depressive disorder, single episode, unspecified: Secondary | ICD-10-CM | POA: Insufficient documentation

## 2011-10-28 DIAGNOSIS — G259 Extrapyramidal and movement disorder, unspecified: Secondary | ICD-10-CM | POA: Insufficient documentation

## 2011-10-28 DIAGNOSIS — F3289 Other specified depressive episodes: Secondary | ICD-10-CM | POA: Insufficient documentation

## 2011-10-28 DIAGNOSIS — J45909 Unspecified asthma, uncomplicated: Secondary | ICD-10-CM | POA: Insufficient documentation

## 2011-10-28 DIAGNOSIS — Z87442 Personal history of urinary calculi: Secondary | ICD-10-CM | POA: Insufficient documentation

## 2011-10-28 DIAGNOSIS — R29818 Other symptoms and signs involving the nervous system: Secondary | ICD-10-CM

## 2011-10-28 DIAGNOSIS — Z79899 Other long term (current) drug therapy: Secondary | ICD-10-CM | POA: Insufficient documentation

## 2011-10-28 DIAGNOSIS — E039 Hypothyroidism, unspecified: Secondary | ICD-10-CM | POA: Insufficient documentation

## 2011-10-28 HISTORY — DX: Headache, unspecified: R51.9

## 2011-10-28 HISTORY — DX: Headache: R51

## 2011-10-28 MED ORDER — PROMETHAZINE HCL 25 MG/ML IJ SOLN
25.0000 mg | Freq: Once | INTRAMUSCULAR | Status: AC
  Administered 2011-10-28: 25 mg via INTRAVENOUS
  Filled 2011-10-28: qty 1

## 2011-10-28 MED ORDER — LORAZEPAM 2 MG/ML IJ SOLN
0.5000 mg | Freq: Once | INTRAMUSCULAR | Status: AC
  Administered 2011-10-28: 0.5 mg via INTRAVENOUS
  Filled 2011-10-28: qty 1

## 2011-10-28 MED ORDER — KETOROLAC TROMETHAMINE 30 MG/ML IJ SOLN
30.0000 mg | Freq: Once | INTRAMUSCULAR | Status: AC
  Administered 2011-10-28: 30 mg via INTRAVENOUS
  Filled 2011-10-28: qty 1

## 2011-10-28 MED ORDER — DEXAMETHASONE SODIUM PHOSPHATE 10 MG/ML IJ SOLN
10.0000 mg | Freq: Once | INTRAMUSCULAR | Status: AC
  Administered 2011-10-28: 10 mg via INTRAVENOUS
  Filled 2011-10-28: qty 1

## 2011-10-28 MED ORDER — DIPHENHYDRAMINE HCL 50 MG/ML IJ SOLN
25.0000 mg | Freq: Once | INTRAMUSCULAR | Status: AC
  Administered 2011-10-28: 25 mg via INTRAVENOUS
  Filled 2011-10-28: qty 1

## 2011-10-28 NOTE — ED Notes (Signed)
Pt called states that her migraine improved after her office visit on 10/25/11. She states that she has another migraine today. Per dr Cathren Harsh advised the pt to proceed to [facility] ED for further evaluation and treatment. Pt agrees.

## 2011-10-28 NOTE — ED Provider Notes (Signed)
History  This chart was scribed for [name]. Leslie Lamas, MD by [name]. This patient was seen in room MH02/MH02 and the patient's care was started at 5:48PM.  CSN: [id number]  Arrival date & time 10/28/11  1723   First MD Initiated Contact with Patient 10/28/11 1748     Level 5 Caveat- Need for Medical Intervention-Pt is actively vomiting  Chief Complaint  Patient presents with  . Headache     The history is provided by the patient and the spouse. History Limited By: active vomiting. No language interpreter was used.    [name] is a 41 y.o. female with a h/o HAs who presents to the Emergency Department complaining of migraine. Pt stated in triage that she has experienced a HA with associated nausea for 5 days. She was seen by her PCP 7/12 and reported that her symptoms were improved after the visit. She stated that she developed another migraine today and was advised to be further evaluated in this ED by her PCP. She has a h/o asthma, anxiety, GERD and endometriosis.   Past Medical History  Diagnosis Date  . Kidney stones   . Asthma   . Depression   . Anxiety   . GERD (gastroesophageal reflux disease)   . Hypothyroidism   . Endometriosis   . History of sinus surgery 2010    MAXILLARY, ETHMOID, SPHENOID  . Obesity, Class III, BMI 40-49.9 (morbid obesity)   . SVD (spontaneous vaginal delivery)     x3  . Generalized headaches   . Adenomyosis 2013    Past Surgical History  Procedure Date  . Knee surgery   . Nasal sinus surgery 2010  . Colposcopy 2007  . Laparoscopy 05/30/2011    Procedure: LAPAROSCOPY OPERATIVE;  Surgeon: Leslie Mire, MD;  Location: [facility];  Service: Gynecology;  Laterality: N/A;  YAG  LASER of Endometriosis.    Family History  Problem Relation Age of Onset  . Cancer Father     lung  . Alcohol abuse Father   . Cancer Other   . Depression Mother   . Depression Sister   . Suicidality Brother   . Depression Brother   . Alcohol  abuse Brother   . Drug abuse Brother   . Depression Sister   . Anesthesia problems Neg Hx     History  Substance Use Topics  . Smoking status: Never Smoker   . Smokeless tobacco: Not on file  . Alcohol Use: No    OB History    Grav Para Term Preterm Abortions TAB SAB Ect Mult Living   3 3 3       3       Review of Systems  Unable to perform ROS: Other    Allergies  Clarithromycin; Moxifloxacin; and Reglan  Home Medications   Current Outpatient Rx  Name Route Sig Dispense Refill  . BUTALBITAL-APAP-CAFFEINE 50-325-40 MG PO TABS Oral Take 1 tablet by mouth every 6 (six) hours as needed. Patient uses this medication for pain.    Marland Kitchen CETIRIZINE HCL 10 MG PO TABS Oral Take 10 mg by mouth daily. Patient uses this for her allergies.    Marland Kitchen ESOMEPRAZOLE MAGNESIUM 40 MG PO CPDR Oral Take 1 capsule (40 mg total) by mouth daily. 90 capsule 3  . IBUPROFEN 200 MG PO TABS Oral Take 600 mg by mouth every 6 (six) hours as needed. Patient used this medication for her headache.    Marland Kitchen LEVOTHYROXINE SODIUM 150 MCG PO  TABS Oral Take 1 tablet (150 mcg total) by mouth daily. 90 tablet 1  . NORETHIN-ETH ESTRAD-FE BIPHAS 1 MG-10 MCG / 10 MCG PO TABS Oral Take 1 tablet by mouth daily. 1 Package 11  . CLONAZEPAM 0.5 MG PO TABS  Take one to two tablets at bedtime as needed for anxiety. 60 tablet 1  . SERTRALINE HCL 100 MG PO TABS Oral Take 2 tablets (200 mg total) by mouth daily. 60 tablet 1    Triage vitals: BP 126/87  Pulse 98  Temp 98 F (36.7 C) (Oral)  Resp 16  Ht 5\' 4"  (1.626 m)  Wt 258 lb (117.028 kg)  BMI 44.29 kg/m2  SpO2 98%  LMP 09/03/2011  Physical Exam  Nursing note and vitals reviewed. Constitutional: She is oriented to person, place, and time. She appears well-developed and well-nourished.       Actively vomiting  HENT:  Head: Normocephalic and atraumatic.  Eyes: EOM are normal.  Neck: Neck supple. No rigidity. Erythema present. No tracheal deviation present.  Cardiovascular:  Normal rate.   Pulmonary/Chest: Effort normal. No respiratory distress.  Musculoskeletal: Normal range of motion.  Neurological: She is alert and oriented to person, place, and time. Coordination normal.  Skin: Skin is warm and dry.  Psychiatric: She has a normal mood and affect. Her behavior is normal.    ED Course  Procedures (including critical care time)  DIAGNOSTIC STUDIES: Oxygen Saturation is 98% on room air, normal by my interpretation.    COORDINATION OF CARE: 6:00PM-Informed pt that I would return after she had received antinausea medications and had stopped vomiting.   Labs Reviewed - No data to display No results found.   1. Migraine   2. Extrapyramidal symptom     Pt initially actively vomiting, oriented, given migraine cocktail.  Pt later developed extrapyramidal symptoms which may have occurred due to IV phenergan.  Pts reports HA and vomiting ceased and improved markedly.  Pt given IV ativan for the anxiousness related to extrapyramidal symptoms, discharged to home, instructed for close follow up with PCP for further discussion regarding HAs and any other concerns.    MDM  I personally performed the services described in this documentation, which was scribed in my presence. The recorded information has been reviewed and considered.      [name]. Leslie Obi, MD 10/31/11 2231

## 2011-10-28 NOTE — ED Notes (Signed)
Pt reports a headache and nausea since Wednesday

## 2011-10-29 ENCOUNTER — Ambulatory Visit (INDEPENDENT_AMBULATORY_CARE_PROVIDER_SITE_OTHER): Payer: 59 | Admitting: Psychiatry

## 2011-10-29 ENCOUNTER — Encounter (HOSPITAL_COMMUNITY): Payer: Self-pay | Admitting: Psychiatry

## 2011-10-29 VITALS — BP 119/83 | HR 88 | Ht 64.0 in | Wt 269.0 lb

## 2011-10-29 DIAGNOSIS — F4001 Agoraphobia with panic disorder: Secondary | ICD-10-CM

## 2011-10-29 DIAGNOSIS — F419 Anxiety disorder, unspecified: Secondary | ICD-10-CM

## 2011-10-29 DIAGNOSIS — F410 Panic disorder [episodic paroxysmal anxiety] without agoraphobia: Secondary | ICD-10-CM

## 2011-10-29 DIAGNOSIS — F4310 Post-traumatic stress disorder, unspecified: Secondary | ICD-10-CM

## 2011-10-29 MED ORDER — CLONAZEPAM 0.5 MG PO TABS: ORAL_TABLET | ORAL | Status: DC

## 2011-10-29 MED ORDER — SERTRALINE HCL 100 MG PO TABS: 200.0000 mg | ORAL_TABLET | Freq: Every day | ORAL | Status: DC

## 2011-10-29 MED ORDER — CLONAZEPAM 0.5 MG PO TABS: 0.5000 mg | ORAL_TABLET | Freq: Every evening | ORAL | Status: DC | PRN
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
## 2011-10-31 ENCOUNTER — Encounter (HOSPITAL_BASED_OUTPATIENT_CLINIC_OR_DEPARTMENT_OTHER): Payer: Self-pay | Admitting: Emergency Medicine

## 2011-11-01 ENCOUNTER — Encounter (HOSPITAL_COMMUNITY): Payer: Self-pay

## 2011-11-01 NOTE — H&P (Signed)
  Patient name  Johnnie, Moten DICTATION#  [id number] CSN# [id number]  Juluis Mire, MD 11/01/2011 6:04 AM

## 2011-11-01 NOTE — H&P (Unsigned)
NAME:  [name], [name] NO.:  [id number]  MEDICAL RECORD NO.:  [id number]  LOCATION:                                 FACILITY:  PHYSICIAN:  Juluis Mire, M.D.        DATE OF BIRTH:  DATE OF ADMISSION: DATE OF DISCHARGE:                             HISTORY & PHYSICAL   The patient is a 41 year old gravida 3, para 3 female presents for laparoscopic-assisted vaginal hysterectomy.  In relation to present admission, the patient has had longstanding history of pelvic endometriosis and chronic pelvic pain.  She has undergone 2 previous laparoscopies.  She has continued to have problems with pain and discomfort despite the use of hormonal suppression.  This has required hospital visits.  She now presents for laparoscopic- assisted vaginal hysterectomy for ultimate management of chronic pelvic pain.  Other alternatives have been discussed.  In terms of allergies, no known drug allergies.  MEDICATIONS:  She has got Zoloft 100 mg each day, Advair each day, levothyroxine 125 mcg each day, Nexium 40 mg each day, and tramadol 50 mg as needed.  PAST MEDICAL HISTORY:  The patient has a history of asthmatic bronchitis.  History of migraine headaches.  History of cervical dysplasia, treated with cryotherapy in the past.  PAST SURGICAL HISTORY:  She had laparoscopy in 1997 and in 2013 for treatment and management of endometriosis.  She has had cystoscopy in 1993.  Sinus surgery in 2010.  Knee surgery in 2007.  OBSTETRICAL HISTORY:  She has had 3 vaginal deliveries.  SOCIAL HISTORY:  No tobacco or alcohol use.  FAMILY HISTORY:  Noncontributory.  REVIEW OF SYSTEMS:  Noncontributory.  PHYSICAL EXAMINATION:  VITAL SIGNS:  The patient is afebrile.  Stable vital signs. HEENT:  The patient is normocephalic.  Pupils are equal, round, reactive to light and accommodation.  Extraocular movements are intact.  Sclerae and conjunctivae are clear.  Oropharynx is clear. NECK:   Not examined. BREASTS:  Not examined. LUNGS:  Clear. CARDIOVASCULAR:  Regular rhythm rate.  There are no murmurs or gallops. ABDOMEN:  Benign.  No mass, organomegaly, or tenderness. PELVIC:  Normal external genitalia.  Vaginal mucosa is clear.  Cervix unremarkable.  Uterus upper limits, normal size, mildly tender.  Adnexa unremarkable. EXTREMITIES:  Trace edema. NEUROLOGIC:  Grossly within normal limits.  IMPRESSION: 1. Chronic pelvic pain secondary to pelvic endometriosis and/or     adenomyosis. 2. Asthmatic bronchitis. 3. Hypothyroidism. 4. History of migraine headaches.  PLAN:  The patient to undergo laparoscopic-assisted vaginal hysterectomy.  The risks of surgery have been discussed including the risk of infection.  The risk of hemorrhage that could require transfusion with the risk of AIDS or hepatitis.  Risk of injury to adjacent organs such as bladder, bowel, ureters that could require further exploratory surgery.  Risk of deep venous thrombosis and pulmonary embolus.  The patient does understand the indications, risks, and alternatives.     Juluis Mire, M.D.     JSM/MEDQ  D:  11/01/2011  T:  11/01/2011  Job:  [id number]

## 2011-11-08 ENCOUNTER — Encounter (HOSPITAL_COMMUNITY): Payer: Self-pay

## 2011-11-08 ENCOUNTER — Encounter (HOSPITAL_COMMUNITY)
Admission: RE | Admit: 2011-11-08 | Discharge: 2011-11-08 | Disposition: A | Discharge status: Home / Self Care | Payer: 59 | Source: Ambulatory Visit | Attending: Obstetrics and Gynecology | Admitting: Obstetrics and Gynecology

## 2011-11-08 HISTORY — DX: Adverse effect of unspecified anesthetic, initial encounter: T41.45XA

## 2011-11-08 HISTORY — DX: Other complications of anesthesia, initial encounter: T88.59XA

## 2011-11-08 LAB — CBC
MCH: 28.2 pg (ref 26.0–34.0)
Platelets: 276 10*3/uL (ref 150–400)
RBC: 4.25 MIL/uL (ref 3.87–5.11)
RDW: 14.7 % (ref 11.5–15.5)

## 2011-11-08 LAB — SURGICAL PCR SCREEN: MRSA, PCR: NEGATIVE

## 2011-11-08 NOTE — Patient Instructions (Addendum)
[name]  11/08/2011   Your procedure is scheduled on:  11/13/11  Enter through the Main Entrance of [facility] at  7:15 AM.  Pick up the phone at the desk and dial [phone].   Call this number if you have problems the morning of surgery: [phone]   Remember:   Do not eat food:After Midnight.  Do not drink clear liquids: After Midnight.  Take these medicines the morning of surgery with A SIP OF WATER: Nexium, Synthroid, amd Clonazepam   Do not wear jewelry, make-up or nail polish.  Do not wear lotions, powders, or perfumes. You may wear deodorant.  Do not shave 48 hours prior to surgery.  Do not bring valuables to the hospital.  Contacts, dentures or bridgework may not be worn into surgery.  Leave suitcase in the car. After surgery it may be brought to your room.  For patients admitted to the hospital, checkout time is 11:00 AM the day of discharge.   Patients discharged the day of surgery will not be allowed to drive home.  Name and phone number of your driver: NASpecial Instructions: CHG Shower Use Special Wash: 1/2 bottle night before surgery and 1/2 bottle morning of surgery.   Please read over the following fact sheets that you were given: MRSA Information

## 2011-11-08 NOTE — Pre-Procedure Instructions (Signed)
History of severe reaction to Reglan.

## 2011-11-12 MED ORDER — CEFAZOLIN SODIUM-DEXTROSE 2-3 GM-% IV SOLR
2.0000 g | INTRAVENOUS | Status: AC
  Administered 2011-11-13: 2 g via INTRAVENOUS

## 2011-11-13 ENCOUNTER — Encounter (HOSPITAL_COMMUNITY): Payer: Self-pay | Admitting: Anesthesiology

## 2011-11-13 ENCOUNTER — Ambulatory Visit (HOSPITAL_COMMUNITY): Payer: 59 | Admitting: Anesthesiology

## 2011-11-13 ENCOUNTER — Encounter (HOSPITAL_COMMUNITY)
Admission: RE | Disposition: A | Discharge status: Home / Self Care | Payer: Self-pay | Source: Ambulatory Visit | Attending: Obstetrics and Gynecology

## 2011-11-13 ENCOUNTER — Ambulatory Visit (HOSPITAL_COMMUNITY)
Admission: RE | Admit: 2011-11-13 | Discharge: 2011-11-14 | Disposition: A | Discharge status: Home / Self Care | Payer: 59 | Source: Ambulatory Visit | Attending: Obstetrics and Gynecology | Admitting: Obstetrics and Gynecology

## 2011-11-13 ENCOUNTER — Encounter (HOSPITAL_COMMUNITY): Payer: Self-pay | Admitting: *Deleted

## 2011-11-13 DIAGNOSIS — N803 Endometriosis of pelvic peritoneum, unspecified: Secondary | ICD-10-CM | POA: Insufficient documentation

## 2011-11-13 DIAGNOSIS — N949 Unspecified condition associated with female genital organs and menstrual cycle: Secondary | ICD-10-CM | POA: Insufficient documentation

## 2011-11-13 DIAGNOSIS — Z01818 Encounter for other preprocedural examination: Secondary | ICD-10-CM | POA: Insufficient documentation

## 2011-11-13 DIAGNOSIS — F329 Major depressive disorder, single episode, unspecified: Secondary | ICD-10-CM

## 2011-11-13 DIAGNOSIS — J309 Allergic rhinitis, unspecified: Secondary | ICD-10-CM

## 2011-11-13 DIAGNOSIS — J209 Acute bronchitis, unspecified: Secondary | ICD-10-CM

## 2011-11-13 DIAGNOSIS — E039 Hypothyroidism, unspecified: Secondary | ICD-10-CM

## 2011-11-13 DIAGNOSIS — F4001 Agoraphobia with panic disorder: Secondary | ICD-10-CM

## 2011-11-13 DIAGNOSIS — Z01812 Encounter for preprocedural laboratory examination: Secondary | ICD-10-CM | POA: Insufficient documentation

## 2011-11-13 DIAGNOSIS — F32A Depression, unspecified: Secondary | ICD-10-CM

## 2011-11-13 DIAGNOSIS — F410 Panic disorder [episodic paroxysmal anxiety] without agoraphobia: Secondary | ICD-10-CM

## 2011-11-13 DIAGNOSIS — G8929 Other chronic pain: Secondary | ICD-10-CM

## 2011-11-13 DIAGNOSIS — J45901 Unspecified asthma with (acute) exacerbation: Secondary | ICD-10-CM

## 2011-11-13 DIAGNOSIS — K219 Gastro-esophageal reflux disease without esophagitis: Secondary | ICD-10-CM

## 2011-11-13 HISTORY — PX: LAPAROSCOPIC ASSISTED VAGINAL HYSTERECTOMY: SHX5398

## 2011-11-13 SURGERY — HYSTERECTOMY, VAGINAL, LAPAROSCOPY-ASSISTED
Anesthesia: General | Site: Abdomen | Wound class: Clean Contaminated

## 2011-11-13 MED ORDER — SCOPOLAMINE 1 MG/3DAYS TD PT72
MEDICATED_PATCH | TRANSDERMAL | Status: AC
  Filled 2011-11-13: qty 1

## 2011-11-13 MED ORDER — KETOROLAC TROMETHAMINE 30 MG/ML IJ SOLN
INTRAMUSCULAR | Status: AC
  Filled 2011-11-13: qty 1

## 2011-11-13 MED ORDER — NEOSTIGMINE METHYLSULFATE 1 MG/ML IJ SOLN
INTRAMUSCULAR | Status: DC | PRN
  Administered 2011-11-13: 2 mg via INTRAVENOUS

## 2011-11-13 MED ORDER — DIPHENHYDRAMINE HCL 50 MG/ML IJ SOLN
12.5000 mg | Freq: Four times a day (QID) | INTRAMUSCULAR | Status: DC | PRN
  Administered 2011-11-13: 12.5 mg via INTRAVENOUS
  Filled 2011-11-13: qty 1

## 2011-11-13 MED ORDER — ONDANSETRON HCL 4 MG/2ML IJ SOLN: 4.0000 mg | Freq: Four times a day (QID) | INTRAMUSCULAR | Status: DC | PRN

## 2011-11-13 MED ORDER — DIPHENHYDRAMINE HCL 50 MG/ML IJ SOLN: 12.5000 mg | Freq: Four times a day (QID) | INTRAMUSCULAR | Status: DC | PRN

## 2011-11-13 MED ORDER — FENTANYL CITRATE 0.05 MG/ML IJ SOLN
INTRAMUSCULAR | Status: AC
  Administered 2011-11-13: 50 ug via INTRAVENOUS
  Filled 2011-11-13: qty 2

## 2011-11-13 MED ORDER — DIPHENHYDRAMINE HCL 12.5 MG/5ML PO ELIX: 12.5000 mg | ORAL_SOLUTION | Freq: Four times a day (QID) | ORAL | Status: DC | PRN

## 2011-11-13 MED ORDER — LEVOTHYROXINE SODIUM 150 MCG PO TABS
150.0000 ug | ORAL_TABLET | Freq: Every day | ORAL | Status: DC
  Administered 2011-11-14: 150 ug via ORAL
  Filled 2011-11-13 (×3): qty 1

## 2011-11-13 MED ORDER — CLONAZEPAM 0.5 MG PO TABS: 0.2500 mg | ORAL_TABLET | Freq: Two times a day (BID) | ORAL | Status: DC | PRN

## 2011-11-13 MED ORDER — DIPHENHYDRAMINE HCL 12.5 MG/5ML PO ELIX
12.5000 mg | ORAL_SOLUTION | Freq: Four times a day (QID) | ORAL | Status: DC | PRN
  Administered 2011-11-14: 12.5 mg via ORAL
  Filled 2011-11-13: qty 5

## 2011-11-13 MED ORDER — LIDOCAINE HCL (CARDIAC) 20 MG/ML IV SOLN
INTRAVENOUS | Status: DC | PRN
  Administered 2011-11-13: 50 mg via INTRAVENOUS

## 2011-11-13 MED ORDER — SCOPOLAMINE 1 MG/3DAYS TD PT72
1.0000 | MEDICATED_PATCH | TRANSDERMAL | Status: DC
  Administered 2011-11-13: 1.5 mg via TRANSDERMAL

## 2011-11-13 MED ORDER — KETOROLAC TROMETHAMINE 30 MG/ML IJ SOLN: 15.0000 mg | Freq: Once | INTRAMUSCULAR | Status: DC | PRN

## 2011-11-13 MED ORDER — NEOSTIGMINE METHYLSULFATE 1 MG/ML IJ SOLN
INTRAMUSCULAR | Status: AC
  Filled 2011-11-13: qty 10

## 2011-11-13 MED ORDER — HYDROMORPHONE 0.3 MG/ML IV SOLN
INTRAVENOUS | Status: DC
  Administered 2011-11-13: 12:00:00 via INTRAVENOUS
  Filled 2011-11-13: qty 25

## 2011-11-13 MED ORDER — HYDROMORPHONE 0.3 MG/ML IV SOLN: INTRAVENOUS | Status: DC

## 2011-11-13 MED ORDER — FENTANYL CITRATE 0.05 MG/ML IJ SOLN
INTRAMUSCULAR | Status: DC | PRN
  Administered 2011-11-13: 50 ug via INTRAVENOUS
  Administered 2011-11-13 (×2): 100 ug via INTRAVENOUS
  Administered 2011-11-13 (×2): 50 ug via INTRAVENOUS
  Administered 2011-11-13: 25 ug via INTRAVENOUS

## 2011-11-13 MED ORDER — ROCURONIUM BROMIDE 100 MG/10ML IV SOLN
INTRAVENOUS | Status: DC | PRN
  Administered 2011-11-13: 30 mg via INTRAVENOUS
  Administered 2011-11-13: 20 mg via INTRAVENOUS

## 2011-11-13 MED ORDER — GLYCOPYRROLATE 0.2 MG/ML IJ SOLN
INTRAMUSCULAR | Status: DC | PRN
  Administered 2011-11-13: 0.4 mg via INTRAVENOUS

## 2011-11-13 MED ORDER — MEPERIDINE HCL 25 MG/ML IJ SOLN: 6.2500 mg | INTRAMUSCULAR | Status: DC | PRN

## 2011-11-13 MED ORDER — SERTRALINE HCL 100 MG PO TABS
200.0000 mg | ORAL_TABLET | Freq: Every day | ORAL | Status: DC
  Administered 2011-11-13 – 2011-11-14 (×2): 200 mg via ORAL
  Filled 2011-11-13 (×3): qty 2

## 2011-11-13 MED ORDER — NALOXONE HCL 0.4 MG/ML IJ SOLN: 0.4000 mg | INTRAMUSCULAR | Status: DC | PRN

## 2011-11-13 MED ORDER — PROPOFOL 10 MG/ML IV EMUL
INTRAVENOUS | Status: AC
  Filled 2011-11-13: qty 20

## 2011-11-13 MED ORDER — SODIUM CHLORIDE 0.9 % IJ SOLN: 9.0000 mL | INTRAMUSCULAR | Status: DC | PRN

## 2011-11-13 MED ORDER — FENTANYL CITRATE 0.05 MG/ML IJ SOLN
25.0000 ug | INTRAMUSCULAR | Status: DC | PRN
  Administered 2011-11-13 (×2): 50 ug via INTRAVENOUS

## 2011-11-13 MED ORDER — KETOROLAC TROMETHAMINE 30 MG/ML IJ SOLN
INTRAMUSCULAR | Status: DC | PRN
  Administered 2011-11-13: 30 mg via INTRAVENOUS

## 2011-11-13 MED ORDER — FLUTICASONE-SALMETEROL 250-50 MCG/DOSE IN AEPB
1.0000 | INHALATION_SPRAY | Freq: Every day | RESPIRATORY_TRACT | Status: DC
  Administered 2011-11-13 – 2011-11-14 (×2): 1 via RESPIRATORY_TRACT
  Filled 2011-11-13: qty 14

## 2011-11-13 MED ORDER — OXYCODONE-ACETAMINOPHEN 5-325 MG PO TABS
1.0000 | ORAL_TABLET | ORAL | Status: DC | PRN
  Administered 2011-11-14: 2 via ORAL
  Filled 2011-11-13: qty 2

## 2011-11-13 MED ORDER — CEFAZOLIN SODIUM-DEXTROSE 2-3 GM-% IV SOLR
INTRAVENOUS | Status: AC
  Filled 2011-11-13: qty 50

## 2011-11-13 MED ORDER — ONDANSETRON HCL 4 MG PO TABS: 4.0000 mg | ORAL_TABLET | Freq: Four times a day (QID) | ORAL | Status: DC | PRN

## 2011-11-13 MED ORDER — FENTANYL CITRATE 0.05 MG/ML IJ SOLN
INTRAMUSCULAR | Status: AC
  Filled 2011-11-13: qty 5

## 2011-11-13 MED ORDER — LACTATED RINGERS IV SOLN
INTRAVENOUS | Status: DC
  Administered 2011-11-13 (×4): via INTRAVENOUS

## 2011-11-13 MED ORDER — BUPIVACAINE HCL (PF) 0.25 % IJ SOLN
INTRAMUSCULAR | Status: DC | PRN
  Administered 2011-11-13: 5 mL

## 2011-11-13 MED ORDER — DEXAMETHASONE SODIUM PHOSPHATE 10 MG/ML IJ SOLN
INTRAMUSCULAR | Status: DC | PRN
  Administered 2011-11-13: 10 mg via INTRAVENOUS

## 2011-11-13 MED ORDER — HYDROMORPHONE 0.3 MG/ML IV SOLN
INTRAVENOUS | Status: DC
  Administered 2011-11-13: 21:00:00 via INTRAVENOUS
  Administered 2011-11-13: 1.68 mg via INTRAVENOUS
  Administered 2011-11-13: 1.2 mg via INTRAVENOUS
  Administered 2011-11-13: 4.2 mg via INTRAVENOUS
  Filled 2011-11-13: qty 25

## 2011-11-13 MED ORDER — ROCURONIUM BROMIDE 50 MG/5ML IV SOLN
INTRAVENOUS | Status: AC
  Filled 2011-11-13: qty 1

## 2011-11-13 MED ORDER — HYDROMORPHONE HCL PF 1 MG/ML IJ SOLN
1.0000 mg | INTRAMUSCULAR | Status: DC | PRN
  Administered 2011-11-13 (×3): 1 mg via INTRAVENOUS

## 2011-11-13 MED ORDER — PROPOFOL 10 MG/ML IV EMUL
INTRAVENOUS | Status: DC | PRN
  Administered 2011-11-13: 50 mg via INTRAVENOUS
  Administered 2011-11-13: 200 mg via INTRAVENOUS
  Administered 2011-11-13: 50 mg via INTRAVENOUS
  Administered 2011-11-13: 60 mg via INTRAVENOUS
  Administered 2011-11-13: 50 mg via INTRAVENOUS
  Administered 2011-11-13: 100 mg via INTRAVENOUS

## 2011-11-13 MED ORDER — HYDROMORPHONE HCL PF 1 MG/ML IJ SOLN
INTRAMUSCULAR | Status: AC
  Administered 2011-11-13: 1 mg via INTRAVENOUS
  Filled 2011-11-13: qty 1

## 2011-11-13 MED ORDER — ZOLPIDEM TARTRATE 5 MG PO TABS: 5.0000 mg | ORAL_TABLET | Freq: Every evening | ORAL | Status: DC | PRN

## 2011-11-13 MED ORDER — MIDAZOLAM HCL 2 MG/2ML IJ SOLN: 0.5000 mg | Freq: Once | INTRAMUSCULAR | Status: DC | PRN

## 2011-11-13 MED ORDER — 0.9 % SODIUM CHLORIDE (POUR BTL) OPTIME
TOPICAL | Status: DC | PRN
  Administered 2011-11-13 (×2): 1000 mL

## 2011-11-13 MED ORDER — LIDOCAINE HCL (CARDIAC) 20 MG/ML IV SOLN
INTRAVENOUS | Status: AC
  Filled 2011-11-13: qty 5

## 2011-11-13 MED ORDER — MIDAZOLAM HCL 2 MG/2ML IJ SOLN
INTRAMUSCULAR | Status: AC
  Filled 2011-11-13: qty 2

## 2011-11-13 MED ORDER — MENTHOL 3 MG MT LOZG: 1.0000 | LOZENGE | OROMUCOSAL | Status: DC | PRN

## 2011-11-13 MED ORDER — LACTATED RINGERS IV SOLN
INTRAVENOUS | Status: DC
  Administered 2011-11-13 – 2011-11-14 (×3): via INTRAVENOUS

## 2011-11-13 MED ORDER — ACETAMINOPHEN 325 MG PO TABS: 650.0000 mg | ORAL_TABLET | ORAL | Status: DC | PRN

## 2011-11-13 MED ORDER — ONDANSETRON HCL 4 MG/2ML IJ SOLN
INTRAMUSCULAR | Status: DC | PRN
  Administered 2011-11-13: 4 mg via INTRAVENOUS

## 2011-11-13 MED ORDER — MIDAZOLAM HCL 5 MG/5ML IJ SOLN
INTRAMUSCULAR | Status: DC | PRN
  Administered 2011-11-13: 2 mg via INTRAVENOUS

## 2011-11-13 MED ORDER — PROMETHAZINE HCL 25 MG/ML IJ SOLN: 6.2500 mg | INTRAMUSCULAR | Status: DC | PRN

## 2011-11-13 MED ORDER — DEXAMETHASONE SODIUM PHOSPHATE 10 MG/ML IJ SOLN
INTRAMUSCULAR | Status: AC
  Filled 2011-11-13: qty 1

## 2011-11-13 MED ORDER — ONDANSETRON HCL 4 MG/2ML IJ SOLN
INTRAMUSCULAR | Status: AC
  Filled 2011-11-13: qty 2

## 2011-11-13 MED ORDER — LACTATED RINGERS IR SOLN
Status: DC | PRN
  Administered 2011-11-13: 3000 mL

## 2011-11-13 MED ORDER — GLYCOPYRROLATE 0.2 MG/ML IJ SOLN
INTRAMUSCULAR | Status: AC
  Filled 2011-11-13: qty 1

## 2011-11-13 SURGICAL SUPPLY — 51 items
CABLE HIGH FREQUENCY MONO STRZ (ELECTRODE) IMPLANT
CATH ROBINSON RED A/P 16FR (CATHETERS) ×2 IMPLANT
CLOTH BEACON ORANGE TIMEOUT ST (SAFETY) ×2 IMPLANT
CONT PATH 16OZ SNAP LID 3702 (MISCELLANEOUS) ×2 IMPLANT
COVER TABLE BACK 60X90 (DRAPES) ×2 IMPLANT
DECANTER SPIKE VIAL GLASS SM (MISCELLANEOUS) ×2 IMPLANT
DERMABOND ADVANCED (GAUZE/BANDAGES/DRESSINGS) ×1
DERMABOND ADVANCED .7 DNX12 (GAUZE/BANDAGES/DRESSINGS) ×1 IMPLANT
DRAPE PROXIMA HALF (DRAPES) ×4 IMPLANT
ELECT REM PT RETURN 9FT ADLT (ELECTROSURGICAL) ×2
ELECTRODE REM PT RTRN 9FT ADLT (ELECTROSURGICAL) ×1 IMPLANT
GAUZE SPONGE 4X4 16PLY XRAY LF (GAUZE/BANDAGES/DRESSINGS) ×2 IMPLANT
GLOVE BIO SURGEON STRL SZ7 (GLOVE) ×6 IMPLANT
GLOVE BIOGEL PI IND STRL 6.5 (GLOVE) ×1 IMPLANT
GLOVE BIOGEL PI IND STRL 7.0 (GLOVE) ×2 IMPLANT
GLOVE BIOGEL PI IND STRL 7.5 (GLOVE) ×2 IMPLANT
GLOVE BIOGEL PI INDICATOR 6.5 (GLOVE) ×1
GLOVE BIOGEL PI INDICATOR 7.0 (GLOVE) ×2
GLOVE BIOGEL PI INDICATOR 7.5 (GLOVE) ×2
GLOVE ECLIPSE 7.5 STRL STRAW (GLOVE) ×2 IMPLANT
GLOVE SURG SS PI 7.0 STRL IVOR (GLOVE) ×4 IMPLANT
GOWN PREVENTION PLUS LG XLONG (DISPOSABLE) ×6 IMPLANT
GOWN STRL REIN XL XLG (GOWN DISPOSABLE) ×2 IMPLANT
GOWN SURGICAL LARGE (GOWNS) ×2 IMPLANT
GOWN SURGICAL XLG (GOWNS) ×2 IMPLANT
HEMOSTAT SURGICEL 2X3 (HEMOSTASIS) IMPLANT
HEMOSTAT SURGICEL 4X8 (HEMOSTASIS) ×2 IMPLANT
NEEDLE INSUFFLATION 14GA 120MM (NEEDLE) IMPLANT
NS IRRIG 1000ML POUR BTL (IV SOLUTION) ×2 IMPLANT
PACK LAVH (CUSTOM PROCEDURE TRAY) ×2 IMPLANT
PROTECTOR NERVE ULNAR (MISCELLANEOUS) ×2 IMPLANT
SEALER TISSUE G2 CVD JAW 45CM (ENDOMECHANICALS) ×2 IMPLANT
SET IRRIG TUBING LAPAROSCOPIC (IRRIGATION / IRRIGATOR) ×2 IMPLANT
SPONGE GAUZE 2X2 8PLY STRL LF (GAUZE/BANDAGES/DRESSINGS) ×2 IMPLANT
STRIP CLOSURE SKIN 1/4X3 (GAUZE/BANDAGES/DRESSINGS) IMPLANT
SUT MON AB 2-0 CT1 27 (SUTURE) ×6 IMPLANT
SUT VIC AB 0 CT1 18XCR BRD8 (SUTURE) ×2 IMPLANT
SUT VIC AB 0 CT1 27 (SUTURE) ×1
SUT VIC AB 0 CT1 27XBRD ANBCTR (SUTURE) ×1 IMPLANT
SUT VIC AB 0 CT1 36 (SUTURE) ×4 IMPLANT
SUT VIC AB 0 CT1 8-18 (SUTURE) ×2
SUT VICRYL 0 UR6 27IN ABS (SUTURE) ×2 IMPLANT
SUT VICRYL 1 TIES 12X18 (SUTURE) ×2 IMPLANT
SUT VICRYL 4-0 PS2 18IN ABS (SUTURE) ×6 IMPLANT
TOWEL OR 17X24 6PK STRL BLUE (TOWEL DISPOSABLE) ×4 IMPLANT
TRAY FOLEY CATH 14FR (SET/KITS/TRAYS/PACK) ×2 IMPLANT
TROCAR BALLN 12MMX100 BLUNT (TROCAR) ×2 IMPLANT
TROCAR Z-THREAD BLADED 11X100M (TROCAR) IMPLANT
TROCAR Z-THREAD BLADED 5X100MM (TROCAR) ×2 IMPLANT
WARMER LAPAROSCOPE (MISCELLANEOUS) ×2 IMPLANT
WATER STERILE IRR 1000ML POUR (IV SOLUTION) IMPLANT

## 2011-11-13 NOTE — H&P (Signed)
  History and physical exam unchanged 

## 2011-11-13 NOTE — Anesthesia Postprocedure Evaluation (Signed)
Anesthesia Post Note  Patient: [name]  Procedure(s) Performed: Procedure(s) (LRB): LAPAROSCOPIC ASSISTED VAGINAL HYSTERECTOMY (N/A)  Anesthesia type: General  Patient location: PACU  Post pain: Pain level controlled  Post assessment: Post-op Vital signs reviewed  Last Vitals:  Filed Vitals:   11/13/11 1215  BP: 132/87  Pulse: 90  Temp: 36.6 C  Resp: 16    Post vital signs: Reviewed  Level of consciousness: sedated  Complications: No apparent anesthesia complicationsfj

## 2011-11-13 NOTE — Transfer of Care (Signed)
Immediate Anesthesia Transfer of Care Note  Patient: [name]  Procedure(s) Performed: Procedure(s) (LRB): LAPAROSCOPIC ASSISTED VAGINAL HYSTERECTOMY (N/A)  Patient Location: PACU  Anesthesia Type: General  Level of Consciousness: sedated  Airway & Oxygen Therapy: Patient Spontanous Breathing and Patient connected to nasal cannula oxygen  Post-op Assessment: Report given to PACU RN  Post vital signs: Reviewed and stable  Complications: No apparent anesthesia complications

## 2011-11-13 NOTE — Anesthesia Preprocedure Evaluation (Addendum)
Anesthesia Evaluation  Patient identified by MRN, date of birth, ID band Patient awake    Reviewed: Allergy & Precautions, H&P , Patient's Chart, lab work & pertinent test results, reviewed documented beta blocker date and time   Airway Mallampati: III TM Distance: >3 FB Neck ROM: full    Dental No notable dental hx.    Pulmonary neg pulmonary ROS, asthma ,  breath sounds clear to auscultation  Pulmonary exam normal       Cardiovascular Exercise Tolerance: Good negative cardio ROS  Rhythm:regular Rate:Normal     Neuro/Psych  Headaches, PSYCHIATRIC DISORDERS Anxiety Depression negative neurological ROS  negative psych ROS   GI/Hepatic negative GI ROS, Neg liver ROS, GERD-  ,  Endo/Other  negative endocrine ROSHypothyroidism Morbid obesity  Renal/GU negative Renal ROS     Musculoskeletal   Abdominal   Peds  Hematology negative hematology ROS (+)   Anesthesia Other Findings Asthma     Depression        Anxiety     GERD (gastroesophageal reflux disease)        Hypothyroidism     Endometriosis        History of sinus surgery 2010 MAXILLARY, ETHMOID, SPHENOID Obesity, Class III, BMI 40-49.9 (morbid obesity)        SVD (spontaneous vaginal delivery)   x3 Generalized headaches        Adenomyosis 2013   Kidney stones        Complication of anesthesia   scoline pain? from use of Succinylcholine              Reproductive/Obstetrics negative OB ROS                           Anesthesia Physical Anesthesia Plan  ASA: III  Anesthesia Plan: General ETT   Post-op Pain Management:    Induction:   Airway Management Planned:   Additional Equipment:   Intra-op Plan:   Post-operative Plan:   Informed Consent: I have reviewed the patients History and Physical, chart, labs and discussed the procedure including the risks, benefits and alternatives for the proposed anesthesia with the patient or  authorized representative who has indicated his/her understanding and acceptance.   Dental Advisory Given  Plan Discussed with: CRNA and Surgeon  Anesthesia Plan Comments:        glidescope for intubation.  Avoid succinylcholine Anesthesia Quick Evaluation

## 2011-11-13 NOTE — Op Note (Signed)
Patient name [name], [name] DICTATION#  [id number] CSN# [id number]   Juluis Mire, MD 11/13/2011 10:38 AM

## 2011-11-13 NOTE — Progress Notes (Signed)
Patient ID: [name], female   DOB: [birth year], 41 y.o.   MRN: [id number] Af vss abd soft  Inc clear Good uo No active bleeding Doing well post op

## 2011-11-13 NOTE — Anesthesia Postprocedure Evaluation (Signed)
  Anesthesia Post-op Note  Patient: [name]  Procedure(s) Performed: Procedure(s) (LRB): LAPAROSCOPIC ASSISTED VAGINAL HYSTERECTOMY (N/A)  Patient Location: [facility]'s Unit  Anesthesia Type: General  Level of Consciousness: sedated  Airway and Oxygen Therapy: Patient Spontanous Breathing and Patient connected to nasal cannula oxygen  Post-op Pain: mild  Post-op Assessment: Post-op Vital signs reviewed  Post-op Vital Signs: Reviewed and stable  Complications: No apparent anesthesia complications

## 2011-11-13 NOTE — Addendum Note (Signed)
Addendum  created 11/13/11 1812 by Algis Greenhouse, CRNA   Modules edited:Notes Section

## 2011-11-13 NOTE — Brief Op Note (Signed)
11/13/2011  10:37 AM  PATIENT:  [name]  41 y.o. female  PRE-OPERATIVE DIAGNOSIS:  pelvic pain  POST-OPERATIVE DIAGNOSIS:  pelvic pain  PROCEDURE:  Procedure(s) (LRB): LAPAROSCOPIC ASSISTED VAGINAL HYSTERECTOMY (N/A)  SURGEON:  Surgeon(s) and Role:    * Juluis Mire, MD - Primary    * W Lodema Hong, MD - Assisting  PHYSICIAN ASSISTANT:   ASSISTANTS: scott bowie   ANESTHESIA:   general  EBL:  Total I/O In: 3000 [I.V.:3000] Out: 700 [Urine:200; Blood:500]  BLOOD ADMINISTERED:none  DRAINS: Urinary Catheter (Foley)   LOCAL MEDICATIONS USED:  MARCAINE     SPECIMEN:  Source of Specimen:  uterus  DISPOSITION OF SPECIMEN:  PATHOLOGY  COUNTS:  YES  TOURNIQUET:  * No tourniquets in log *  DICTATION: .Other Dictation: Dictation Number [id number]  PLAN OF CARE: overnight obs  PATIENT DISPOSITION:  PACU - hemodynamically stable.   Delay start of Pharmacological VTE agent (>24hrs) due to surgical blood loss or risk of bleeding: no

## 2011-11-14 ENCOUNTER — Encounter (HOSPITAL_COMMUNITY): Payer: Self-pay | Admitting: Obstetrics and Gynecology

## 2011-11-14 LAB — CBC
HCT: 27.6 % — ABNORMAL LOW (ref 36.0–46.0)
MCH: 28.2 pg (ref 26.0–34.0)
MCV: 86.5 fL (ref 78.0–100.0)
Platelets: 224 10*3/uL (ref 150–400)
RBC: 3.19 MIL/uL — ABNORMAL LOW (ref 3.87–5.11)
RDW: 14.7 % (ref 11.5–15.5)
WBC: 9.3 10*3/uL (ref 4.0–10.5)

## 2011-11-14 MED ORDER — HYDROMORPHONE 0.3 MG/ML IV SOLN
INTRAVENOUS | Status: DC
  Administered 2011-11-14: 0.9 mg via INTRAVENOUS
  Administered 2011-11-14: 1.2 mg via INTRAVENOUS

## 2011-11-14 NOTE — Progress Notes (Signed)
1 Day Post-Op Procedure(s) (LRB): LAPAROSCOPIC ASSISTED VAGINAL HYSTERECTOMY (N/A)  Subjective: Patient reports tolerating PO.    Objective: I have reviewed patient's vital signs and labs.  GI: soft, non-tender; bowel sounds normal; no masses,  no organomegaly Vaginal Bleeding: minimal Incisions clear Assessment: s/p Procedure(s) (LRB): LAPAROSCOPIC ASSISTED VAGINAL HYSTERECTOMY (N/A): stable  Plan: Discharge home  LOS: 1 day    Elina Streng S 11/14/2011, 7:49 AM

## 2011-11-14 NOTE — Discharge Summary (Signed)
  Patient name  [name], [name] DICTATION#  [id number] CSN# [id number]  [facility], MD 11/14/2011 7:50 AM

## 2011-11-14 NOTE — Op Note (Signed)
[name], GARMON           ACCOUNT NO.:  [id number]  MEDICAL RECORD NO.:  [id number]  LOCATION:  9317                          FACILITY:  [facility]  PHYSICIAN:  Juluis Mire, M.D.   DATE OF BIRTH:  15-Nov-1970  DATE OF PROCEDURE:  11/13/2011 DATE OF DISCHARGE:                              OPERATIVE REPORT   PREOPERATIVE DIAGNOSIS:  Pelvic pain secondary to endometriosis and/or uterine adenomyosis.  POSTOPERATIVE DIAGNOSIS:  Pelvic pain secondary to endometriosis and/or uterine adenomyosis.  PROCEDURE:  Laparoscopic-assisted vaginal hysterectomy.  SURGEON:  Juluis Mire, MD.  ASSISTANTLuvenia Redden, MD  ANESTHESIA:  General endotracheal.  ESTIMATED BLOOD LOSS:  600-700 mL.  PACKS:  None.  DRAINS:  Urethral Foley.  INTRAOPERATIVE BLOOD PLACEMENT:  None.  COMPLICATIONS:  None.  INDICATION:  As dictated in history and physical.  PROCEDURE AS FOLLOWS:  The patient was taken to OR and placed in supine position.  After satisfactory level of general endotracheal anesthesia was obtained, the patient was placed in dorsal lithotomy position using Allen stirrups.  The abdomen, perineum, and vagina prepped out with Betadine.  Bladder was emptied by in and out catheterization.  A Hulka tenaculum was put in place and secured.  The patient was draped sterile field.  Subumbilical incision made knife carried through subcutaneous tissue.  Fascia was entered sharply, incision was fashioned laterally. Peritoneum was entered with blunt finger pressure.  The open laparoscopic trocar was put in place and secured.  The abdomen was inflated with carbon dioxide.  A 5 mm trocar was put in place in suprapubic area under direct visualization.  Upper abdomen including the liver was clear.  Did not see the gallbladder and both lateral gutters were clear.  I believe the appendix was retrocecal.  I could not visualize it.  Uterus was upper limits of normal size.  Tubes and ovaries were  unremarkable.  No active endometriosis or adhesions were noted.  Using EnSeal, the right ovarian vasculature was cauterized and incised.  The right tube and mesosalpinx were cauterized and incised and the right round ligament was cauterized and incised.  We then went to the left side.  The left utero-ovarian pedicle was cauterized and incised.  The left tube and mesosalpinx cauterized and incised.  The left round ligament was cauterized and incised.  The broad ligaments on each side were further cauterized and incised.  Separated the uterus from the broad ligament.  We did get some oozing on the left side and was brought out of control using EnSeal.  At this point in time, the decision was to go vaginally.  The abdomen was inflated with carbon dioxide.  The lap scope was removed.  The patient's legs were repositioned.  The Hulka tenaculum was then taken out.  Cervix grasped with Christella Hartigan tenaculum.  Periods without we had difficulty entering the cul-de-sac therefore we clamped cut and suture ligated both uterosacral ligaments.  We then were able to enter the cul-de-sac.  A long nose weighted speculum was put in place.  The reflection of vaginal mucosa anteriorly was incised.  Then we dissected the bladder superiorly. Paracervical tissue was clamped, cut, and suture ligated with 0 Vicryl. Vesicouterine space was  identified and entered sharply and retractors were put in place using clamp cut and tie technique with suture ligatures of 0 Vicryl.  The parametrium was serially separated from the sides of the uterus.  Uterus was then flipped.  Remaining pedicles were clamped and cut.  The pedicles were secured with free ties of 0 Vicryl. Areas of oozing on the left side brought to control with figure-of- eights of 0 Vicryl.  The posterior cuff was run with a running interlocking suture of 0 Vicryl.  Right now we had fair hemostasis. Vaginal mucosa was reapproximated with interrupted  figure-of-eights of 2- 0 Monocryl.  A Foley was placed to straight drain, retrieval of clear urine.  Patient's legs were repositioned.  Laparoscope was reintroduced. Visualization did reveal some oozing from the vaginal cuff, brought on control with the bipolar, one arterial bleeder at the left corner of the cuff brought on control with the bipolar.  We thoroughly irrigated the pelvis.  All clots were removed.  Both sites of the ovaries were hemostatically intact.  We deflated the abdomen and reinflated and saw no active bleeding from the vaginal cuff, a small amount of surgical sill was put in place at the vaginal cuff area.  The abdomen was deflated with carbon dioxide.  All trocars were removed.  Subumbilical fascia closed with two figure-of-eight 0 Vicryl.  Skin was closed with interrupted subcuticulars of 4-0 Vicryl.  Suprapubic incision was closed with Dermabond.  Sponge, instrument, and needle count was correct by circulating nurse x2.  Foley catheter remained clear at the time of closure.  The patient tolerated the procedure well and was returned to recovery room in good condition.     Juluis Mire, M.D.     JSM/MEDQ  D:  11/13/2011  T:  11/14/2011  Job:  [id number]

## 2011-11-14 NOTE — Progress Notes (Signed)
Pt is discharged in the care of Husband Downstairs per ambulatory. Denies any pain or discomfort.Abdomial operative site is clean and dry. No heavy vaginal bleeding. Discharge instructions were given to pt with good understanding. Questions asked and answered; Stable.

## 2011-11-14 NOTE — Discharge Summary (Signed)
NAMEHAGAN, MALTZ           ACCOUNT NO.:  [id number]  MEDICAL RECORD NO.:  [id number]  LOCATION:  9317                          FACILITY:  [facility]  PHYSICIAN:  Juluis Mire, M.D.   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  DISCHARGE DIAGNOSES:  Pelvic pain secondary to pelvic endometriosis and/or adenomyosis.  OPERATIVE PROCEDURE:  Laparoscopic-assisted vaginal hysterectomy.  For complete history and physical, please see dictated note.  COURSE IN THE HOSPITAL:  The patient underwent above-noted surgery. Postop, did well.  Postop hemoglobin was 9.  On first postop day, was tolerating her regular diet.  Urine output was clear and adequate. Abdominal exam was benign.  Incisions were clear.  Bowel sounds were active.  No signs of deep venous thrombosis.  In terms of complications, none were encountered during the stay in the hospital.  The patient was discharged home in stable condition.  DISPOSITION:  The patient is going to be discharged home on Dilaudid as needed for pain.  She will continue iron sulfate supplementation and stool softeners.  She was instructed to call for signs of infection, nausea, vomiting, active vaginal bleeding, increasing pain, or should she call with signs and symptoms of deep venous thrombosis and pulmonary embolus.  The patient expressed understanding.     Juluis Mire, M.D.     JSM/MEDQ  D:  11/14/2011  T:  11/14/2011  Job:  [id number]

## 2011-11-18 ENCOUNTER — Other Ambulatory Visit (HOSPITAL_COMMUNITY): Payer: Self-pay | Admitting: Psychiatry

## 2011-11-18 ENCOUNTER — Ambulatory Visit: Payer: Self-pay | Admitting: Sports Medicine

## 2011-11-19 ENCOUNTER — Ambulatory Visit: Payer: Self-pay | Admitting: Sports Medicine

## 2011-11-22 ENCOUNTER — Telehealth (HOSPITAL_COMMUNITY): Payer: Self-pay | Admitting: Psychiatry

## 2011-11-22 DIAGNOSIS — F4001 Agoraphobia with panic disorder: Secondary | ICD-10-CM

## 2011-11-22 MED ORDER — SERTRALINE HCL 100 MG PO TABS: 200.0000 mg | ORAL_TABLET | Freq: Every day | ORAL | Status: DC

## 2011-11-22 NOTE — Telephone Encounter (Signed)
Pharmacy faxed in  Request for sertraline. Patient had a refill previously from another physician. Will send in 30 day supply with one refill.

## 2011-12-19 ENCOUNTER — Encounter: Payer: Self-pay | Admitting: Sports Medicine

## 2011-12-19 ENCOUNTER — Ambulatory Visit (INDEPENDENT_AMBULATORY_CARE_PROVIDER_SITE_OTHER): Payer: 59 | Admitting: Sports Medicine

## 2011-12-19 VITALS — BP 126/88 | HR 84 | Ht 64.5 in | Wt 269.0 lb

## 2011-12-19 DIAGNOSIS — D373 Neoplasm of uncertain behavior of appendix: Secondary | ICD-10-CM | POA: Insufficient documentation

## 2011-12-19 DIAGNOSIS — R109 Unspecified abdominal pain: Secondary | ICD-10-CM

## 2011-12-19 MED ORDER — HYOSCYAMINE SULFATE 0.125 MG PO TABS: 0.1250 mg | ORAL_TABLET | ORAL | Status: DC | PRN

## 2011-12-19 MED ORDER — TRAMADOL HCL 50 MG PO TABS: 50.0000 mg | ORAL_TABLET | Freq: Four times a day (QID) | ORAL | Status: DC | PRN

## 2011-12-19 NOTE — Progress Notes (Signed)
Patient ID: [name], female   DOB: 1971/01/05, 41 y.o.   MRN: [id number] Subjective:    CC: Establish care. 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 She localizes the pain in the left lower quadrant, suprapubic, and right lower quadrant. She denies any associated dysuria, or vaginal discharge. Pain occurs with significant nausea, dry heaves, as well as diarrhea which occurs at least twice a day, is loose, and with mucus. She denies any hematochezia, melena, or hematemesis. Pain is crampy, and is not relieved by stooling. He denies any fevers, chills, or consumption of possibly contaminated food. Nothing makes it better, nothing makes it worse. It does not radiate.   Past medical history, Surgical history, Family history, Social history, Allergies, and medications have been entered into the medical record, reviewed, and no changes needed.   Review of Systems: No headache, visual changes,  positive for nausea,  positive for vomiting,  positive for diarrhea, constipation, dizziness, positive for abdominal pain , skin rash, fevers, chills, night sweats, weight loss, chest pain, body aches, joint swelling, muscle aches, or shortness of breath.   Objective:    General: Well Developed, well nourished, and in no acute distress.  Neuro: Alert and oriented x3, extra-ocular muscles intact.  HEENT: Normocephalic, atraumatic, pupils equal round reactive to light, neck supple, no masses, no lymphadenopathy, thyroid nonpalpable.  Skin: Warm and dry, no rashes noted.  Cardiac: Regular rate and rhythm, no murmurs rubs or gallops.  Respiratory: Clear to auscultation bilaterally. Not using accessory muscles, speaking in full sentences.  Abdominal:  soft, tender to palpation predominantly in the suprapubic, and left lower quadrant, positive bowel sounds.   Musculoskeletal: Shoulder, elbow, wrist, hip, knee, ankle  stable, and with full range of motion.  Impression and Recommendations:

## 2011-12-19 NOTE — Assessment & Plan Note (Addendum)
Differential diagnosis is large. Crohn's, ulcerative colitis, celiac disease, parasitic infection, diverticulosis/diverticulitis, Whipple's disease are all possible. I do suspect that irritable bowel syndrome is less likely with lack of improvement after stooling. In addition to all the above ordered blood tests, and stool tests, she will also do a fecal occult blood test. Will also try Anaspaz, and tramadol for pain in the meantime. I will see her back in one week to go over all results. I do suspect we may also need a CT of her abdomen and pelvis with IV, and oral contrast. Should all of the above be inconclusive, I would like her to see a gastroenterologist.

## 2011-12-20 ENCOUNTER — Other Ambulatory Visit: Payer: Self-pay | Admitting: Sports Medicine

## 2011-12-20 ENCOUNTER — Other Ambulatory Visit: Payer: Self-pay

## 2011-12-20 ENCOUNTER — Other Ambulatory Visit (INDEPENDENT_AMBULATORY_CARE_PROVIDER_SITE_OTHER): Payer: Self-pay

## 2011-12-20 ENCOUNTER — Telehealth: Payer: Self-pay

## 2011-12-20 DIAGNOSIS — R197 Diarrhea, unspecified: Secondary | ICD-10-CM

## 2011-12-20 LAB — POC HEMOCCULT BLD/STL (OFFICE/1-CARD/DIAGNOSTIC): Fecal Occult Blood, POC: NEGATIVE

## 2011-12-20 MED ORDER — DIPHENOXYLATE-ATROPINE 2.5-0.025 MG PO TABS: ORAL_TABLET | ORAL | Status: DC

## 2011-12-20 MED ORDER — PROMETHAZINE HCL 25 MG PO TABS: 25.0000 mg | ORAL_TABLET | Freq: Four times a day (QID) | ORAL | Status: DC | PRN

## 2011-12-20 NOTE — Telephone Encounter (Signed)
[name] complains that her diarrhea is worse. Everything causes her to have vomiting and diarrhea, even ice. What can she do to help with the diarrhea and vomiting?

## 2011-12-20 NOTE — Telephone Encounter (Signed)
Let's try some diphenoxylate, and Phenergan.  Is the Anaspaz working at all?

## 2011-12-21 LAB — CBC WITH DIFFERENTIAL/PLATELET
Basophils Absolute: 0.1 10*3/uL (ref 0.0–0.1)
Basophils Relative: 1 % (ref 0–1)
Eosinophils Absolute: 0.7 10*3/uL (ref 0.0–0.7)
Eosinophils Relative: 9 % — ABNORMAL HIGH (ref 0–5)
HCT: 30.7 % — ABNORMAL LOW (ref 36.0–46.0)
Hemoglobin: 10.3 g/dL — ABNORMAL LOW (ref 12.0–15.0)
Lymphocytes Relative: 33 % (ref 12–46)
Lymphs Abs: 2.5 K/uL (ref 0.7–4.0)
MCH: 27 pg (ref 26.0–34.0)
MCHC: 33.6 g/dL (ref 30.0–36.0)
MCV: 80.4 fL (ref 78.0–100.0)
Monocytes Absolute: 0.6 10*3/uL (ref 0.1–1.0)
Monocytes Relative: 8 % (ref 3–12)
Neutro Abs: 3.8 K/uL (ref 1.7–7.7)
Neutrophils Relative %: 49 % (ref 43–77)
Platelets: 347 K/uL (ref 150–400)
RBC: 3.82 MIL/uL — ABNORMAL LOW (ref 3.87–5.11)
RDW: 14.2 % (ref 11.5–15.5)
WBC: 7.6 K/uL (ref 4.0–10.5)

## 2011-12-21 LAB — COMPREHENSIVE METABOLIC PANEL
AST: 24 U/L (ref 0–37)
Albumin: 3.9 g/dL (ref 3.5–5.2)
Alkaline Phosphatase: 74 U/L (ref 39–117)
BUN: 7 mg/dL (ref 6–23)
Creat: 0.64 mg/dL (ref 0.50–1.10)
Glucose, Bld: 99 mg/dL (ref 70–99)
Total Bilirubin: 0.4 mg/dL (ref 0.3–1.2)

## 2011-12-21 LAB — COMPREHENSIVE METABOLIC PANEL WITH GFR
ALT: 26 U/L (ref 0–35)
CO2: 26 meq/L (ref 19–32)
Calcium: 8.9 mg/dL (ref 8.4–10.5)
Chloride: 100 meq/L (ref 96–112)
Potassium: 4.3 meq/L (ref 3.5–5.3)
Sodium: 138 meq/L (ref 135–145)
Total Protein: 6.9 g/dL (ref 6.0–8.3)

## 2011-12-21 LAB — TSH: TSH: 11.279 u[IU]/mL — ABNORMAL HIGH (ref 0.350–4.500)

## 2011-12-21 LAB — SEDIMENTATION RATE: Sed Rate: 5 mm/hr (ref 0–22)

## 2011-12-21 LAB — FECAL LACTOFERRIN, QUANT: Lactoferrin: NEGATIVE

## 2011-12-23 ENCOUNTER — Telehealth (HOSPITAL_COMMUNITY): Payer: Self-pay | Admitting: Psychiatry

## 2011-12-23 ENCOUNTER — Ambulatory Visit (INDEPENDENT_AMBULATORY_CARE_PROVIDER_SITE_OTHER): Payer: 59 | Admitting: Psychiatry

## 2011-12-23 ENCOUNTER — Ambulatory Visit: Payer: Self-pay | Admitting: Sports Medicine

## 2011-12-23 ENCOUNTER — Encounter: Payer: Self-pay | Admitting: Sports Medicine

## 2011-12-23 ENCOUNTER — Ambulatory Visit (INDEPENDENT_AMBULATORY_CARE_PROVIDER_SITE_OTHER): Payer: 59 | Admitting: Sports Medicine

## 2011-12-23 ENCOUNTER — Encounter (HOSPITAL_COMMUNITY): Payer: Self-pay | Admitting: Psychiatry

## 2011-12-23 VITALS — BP 127/79 | HR 79 | Temp 97.2°F | Wt 267.0 lb

## 2011-12-23 VITALS — BP 112/85 | HR 74 | Ht 64.0 in | Wt 268.0 lb

## 2011-12-23 DIAGNOSIS — J4541 Moderate persistent asthma with (acute) exacerbation: Secondary | ICD-10-CM | POA: Insufficient documentation

## 2011-12-23 DIAGNOSIS — J209 Acute bronchitis, unspecified: Secondary | ICD-10-CM

## 2011-12-23 DIAGNOSIS — E039 Hypothyroidism, unspecified: Secondary | ICD-10-CM

## 2011-12-23 DIAGNOSIS — D509 Iron deficiency anemia, unspecified: Secondary | ICD-10-CM

## 2011-12-23 DIAGNOSIS — F4001 Agoraphobia with panic disorder: Secondary | ICD-10-CM

## 2011-12-23 DIAGNOSIS — R109 Unspecified abdominal pain: Secondary | ICD-10-CM

## 2011-12-23 DIAGNOSIS — J45909 Unspecified asthma, uncomplicated: Secondary | ICD-10-CM

## 2011-12-23 DIAGNOSIS — D649 Anemia, unspecified: Secondary | ICD-10-CM

## 2011-12-23 HISTORY — DX: Iron deficiency anemia, unspecified: D50.9

## 2011-12-23 LAB — GLIA (IGA/G) + TTG IGA
Deamidated Gliadin Abs, IgG: 8.7 U/mL (ref <20)
Gliadin IgA: 5.4 U/mL (ref <20)
Tissue Transglutaminase Ab, IgA: 7.7 U/mL (ref <20)

## 2011-12-23 LAB — OVA AND PARASITE SCREEN: OP: NONE SEEN

## 2011-12-23 MED ORDER — HYDROCODONE-HOMATROPINE 5-1.5 MG/5ML PO SYRP: 2.5000 mL | ORAL_SOLUTION | Freq: Four times a day (QID) | ORAL | Status: DC | PRN

## 2011-12-23 MED ORDER — AZITHROMYCIN 250 MG PO TABS: ORAL_TABLET | ORAL | Status: AC

## 2011-12-23 MED ORDER — CLONAZEPAM 0.5 MG PO TABS: ORAL_TABLET | ORAL | Status: DC

## 2011-12-23 MED ORDER — PREDNISONE 50 MG PO TABS: ORAL_TABLET | ORAL | Status: DC

## 2011-12-23 MED ORDER — SERTRALINE HCL 100 MG PO TABS: 200.0000 mg | ORAL_TABLET | Freq: Every day | ORAL | Status: DC

## 2011-12-23 NOTE — Progress Notes (Signed)
Patient ID: [name], female   DOB: 23-Jun-1970, 41 y.o.   MRN: [id number] Subjective:    CC: URI, follow up some blood work.  HPI: [name] comes back with new symptoms of cough, rhinorrhea, mild shortness of breath. She had recent exposure to her children who are having similar symptoms. She has had to use her inhaler more often than usual, and is having a productive cough.  Abdominal pain: She did present to me a couple weeks ago with a constellation of symptoms. She RTC in several gastroenterologists. Her symptoms were bizarre. The large panel of blood work including tests for celiac disease, Crohn's, ulcerative colitis. Also order some stool studies. I started her on Levsin at the time, and she does note a marked improvement in her symptoms suggested that this was merely irritable bowel syndrome.  Anemia: Normocytic, this has not yet been worked up. When I see her back later this week to go over lab results, we will further workup her anemia.   Past medical history, Surgical history, Family history, Social history, Allergies, and medications have been entered into the medical record, reviewed, and no changes needed.   Review of Systems: No fevers, chills, night sweats, weight loss, chest pain, or shortness of breath.   Objective:    General: Well Developed, well nourished, and in no acute distress.  Neuro: Alert and oriented x3, extra-ocular muscles intact.  HEENT: Normocephalic, atraumatic, pupils equal round reactive to light, neck supple, no masses, no lymphadenopathy, thyroid nonpalpable.  Skin: Warm and dry, no rashes. Cardiac: Regular rate and rhythm, no murmurs rubs or gallops.  Respiratory: Clear to auscultation bilaterally. Not using accessory muscles, speaking in full sentences.  Impression and Recommendations:
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
## 2011-12-23 NOTE — Assessment & Plan Note (Signed)
Patient is non-compliant with levothyroxine. Her TSH is very high. 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 She worked very hard on being compliant with her levothyroxine, and we'll recheck her levels in 6 weeks.

## 2011-12-23 NOTE — Assessment & Plan Note (Signed)
Azithromycin. Prednisone. Hydrocodone.

## 2011-12-23 NOTE — Telephone Encounter (Signed)
Opened in error

## 2011-12-23 NOTE — Assessment & Plan Note (Signed)
Normocytic, this has not yet been worked up. When I see her back later this week to go over lab results, we will further workup her anemia.

## 2011-12-23 NOTE — Assessment & Plan Note (Signed)
Diagnosis is still unclear, however with improvement with Levsin, irritable bowel syndrome is a likely possibility. We still do have several lab results on results including all of her stool studies, as well as ASCA, celiac panel, ANCA panel. She will come back later this week to go over results that we have.

## 2011-12-24 ENCOUNTER — Ambulatory Visit: Payer: Self-pay | Admitting: Sports Medicine

## 2011-12-24 LAB — H. PYLORI ANTIBODY, IGG: H Pylori IgG: <0.4 {ISR}

## 2011-12-25 LAB — E. HISTOLYTICA ANTIBODY (AMOEBA AB)

## 2011-12-25 LAB — CLOSTRIDIUM DIFFICILE EIA: CDIFTX: NEGATIVE

## 2011-12-26 ENCOUNTER — Ambulatory Visit (INDEPENDENT_AMBULATORY_CARE_PROVIDER_SITE_OTHER): Payer: 59 | Admitting: Sports Medicine

## 2011-12-26 ENCOUNTER — Encounter: Payer: Self-pay | Admitting: Sports Medicine

## 2011-12-26 VITALS — BP 123/82 | HR 93 | Temp 98.7°F | Wt 271.0 lb

## 2011-12-26 DIAGNOSIS — D649 Anemia, unspecified: Secondary | ICD-10-CM

## 2011-12-26 DIAGNOSIS — R109 Unspecified abdominal pain: Secondary | ICD-10-CM

## 2011-12-26 LAB — POCT URINALYSIS DIPSTICK
Bilirubin, UA: NEGATIVE
Glucose, UA: NEGATIVE
Ketones, UA: NEGATIVE
Leukocytes, UA: NEGATIVE
Nitrite, UA: NEGATIVE
Protein, UA: NEGATIVE
Spec Grav, UA: 1.01
Urobilinogen, UA: 0.2
pH, UA: 6

## 2011-12-26 MED ORDER — HYOSCYAMINE SULFATE 0.125 MG PO TABS: 0.1250 mg | ORAL_TABLET | ORAL | Status: DC | PRN

## 2011-12-26 MED ORDER — DIPHENOXYLATE-ATROPINE 2.5-0.025 MG PO TABS: ORAL_TABLET | ORAL | Status: DC

## 2011-12-26 MED ORDER — DIPHENOXYLATE-ATROPINE 2.5-0.025 MG PO TABS: ORAL_TABLET | ORAL | Status: AC

## 2011-12-26 NOTE — Addendum Note (Signed)
Addended by: Chalmers Cater on: 12/26/2011 02:14 PM   Modules accepted: Orders

## 2011-12-26 NOTE — Assessment & Plan Note (Addendum)
The good news is, the vast majority of her lab tests have been negative. She does note some improvement with the Levsin. Because her pain is still present, and not better, I would like to get a CT scan of her abdomen and pelvis with IV and oral contrast. Urinalysis is negative today. I let her known what the CT scan shows . We will refill her Levsin. I'm also having her double her tramadol 100 mg 3 times a day. I did also ask her to do some of her own personal research on Amitiza, and if she is interested I am happy to prescribe this.  I do suspect that she may need referral to a gastroenterologist should the CT scan the unrevealing. The only other thing I can think of, is a lower endoscopy.

## 2011-12-26 NOTE — Assessment & Plan Note (Signed)
Again, we will defer this to a future visit. Her abdominal pain takes precedence today.

## 2011-12-26 NOTE — Progress Notes (Addendum)
Patient ID: [name], female   DOB: Aug 17, 1970, 41 y.o.   MRN: [id number] Subjective:    CC: Followup abdominal pain with diarrhea  HPI: [name] comes back in for followup of her chronic abdominal pain with diarrhea. We have done a smorgasbord of tests that included:  CBC, CMET, ova and parasites, Entamoeba, Giardia antigen, H. pylori, sedimentation rate, celiac panel, ANCA panel, fecal lactoferrin, C. difficile, stool culture, TSH all of which have been negative.  I have placed her on Levsin which has been moderately effective for her symptoms. Unfortunately today she notes that her symptoms are worse, with worsening abdominal pain in her lower abdomen.  She continues to deny any dysuria, or vaginal discharge.  She does currently take tramadol as well for the pain which is only moderately effective.  Past medical history, Surgical history, Family history, Social history, Allergies, and medications have been entered into the medical record, reviewed, and no changes needed.   Review of Systems: No fevers, chills, night sweats, weight loss, chest pain, or shortness of breath.   Objective:    General: Well Developed, well nourished, and in no acute distress.  Neuro: Alert and oriented x3, extra-ocular muscles intact.  HEENT: Normocephalic, atraumatic, pupils equal round reactive to light, neck supple, no masses, no lymphadenopathy, thyroid nonpalpable.  Skin: Warm and dry, no rashes. Cardiac: Regular rate and rhythm, no murmurs rubs or gallops.  Respiratory: Clear to auscultation bilaterally. Not using accessory muscles, speaking in full sentences. Abdomen: Tender to palpation in the suprapubic, right lower quadrant, and left lower quadrant. There is minimal guarding, no rigidity, and no palpable masses. There is no rebound tenderness.  Impression and Recommendations:

## 2011-12-27 ENCOUNTER — Telehealth: Payer: Self-pay

## 2011-12-27 NOTE — Telephone Encounter (Signed)
Patient advised of CT requirements and given the number to call and schedule an appointment.

## 2012-01-01 ENCOUNTER — Encounter: Payer: Self-pay | Admitting: Sports Medicine

## 2012-01-01 ENCOUNTER — Ambulatory Visit (HOSPITAL_BASED_OUTPATIENT_CLINIC_OR_DEPARTMENT_OTHER)
Admission: RE | Admit: 2012-01-01 | Discharge: 2012-01-01 | Disposition: A | Discharge status: Home / Self Care | Payer: 59 | Source: Ambulatory Visit | Attending: Sports Medicine | Admitting: Sports Medicine

## 2012-01-01 ENCOUNTER — Encounter (HOSPITAL_COMMUNITY): Payer: Self-pay | Admitting: Emergency Medicine

## 2012-01-01 ENCOUNTER — Ambulatory Visit (INDEPENDENT_AMBULATORY_CARE_PROVIDER_SITE_OTHER): Payer: 59 | Admitting: Sports Medicine

## 2012-01-01 ENCOUNTER — Inpatient Hospital Stay (HOSPITAL_COMMUNITY)
Admission: EM | Admit: 2012-01-01 | Discharge: 2012-01-03 | DRG: 342 | Disposition: A | Discharge status: Home / Self Care | Payer: 59 | Attending: General Surgery | Admitting: General Surgery

## 2012-01-01 VITALS — BP 123/85 | HR 85 | Temp 98.0°F | Resp 18 | Wt 262.0 lb

## 2012-01-01 DIAGNOSIS — R109 Unspecified abdominal pain: Secondary | ICD-10-CM

## 2012-01-01 DIAGNOSIS — Z6841 Body Mass Index (BMI) 40.0 and over, adult: Secondary | ICD-10-CM

## 2012-01-01 DIAGNOSIS — E669 Obesity, unspecified: Secondary | ICD-10-CM | POA: Diagnosis present

## 2012-01-01 DIAGNOSIS — R110 Nausea: Secondary | ICD-10-CM

## 2012-01-01 DIAGNOSIS — R112 Nausea with vomiting, unspecified: Secondary | ICD-10-CM | POA: Insufficient documentation

## 2012-01-01 DIAGNOSIS — E039 Hypothyroidism, unspecified: Secondary | ICD-10-CM | POA: Diagnosis present

## 2012-01-01 DIAGNOSIS — F4001 Agoraphobia with panic disorder: Secondary | ICD-10-CM

## 2012-01-01 DIAGNOSIS — K219 Gastro-esophageal reflux disease without esophagitis: Secondary | ICD-10-CM | POA: Diagnosis present

## 2012-01-01 DIAGNOSIS — K7689 Other specified diseases of liver: Secondary | ICD-10-CM | POA: Insufficient documentation

## 2012-01-01 DIAGNOSIS — J45909 Unspecified asthma, uncomplicated: Secondary | ICD-10-CM | POA: Diagnosis present

## 2012-01-01 DIAGNOSIS — F329 Major depressive disorder, single episode, unspecified: Secondary | ICD-10-CM | POA: Diagnosis present

## 2012-01-01 DIAGNOSIS — Z01812 Encounter for preprocedural laboratory examination: Secondary | ICD-10-CM

## 2012-01-01 DIAGNOSIS — G8929 Other chronic pain: Secondary | ICD-10-CM | POA: Diagnosis present

## 2012-01-01 DIAGNOSIS — F411 Generalized anxiety disorder: Secondary | ICD-10-CM | POA: Diagnosis present

## 2012-01-01 DIAGNOSIS — N200 Calculus of kidney: Secondary | ICD-10-CM | POA: Insufficient documentation

## 2012-01-01 DIAGNOSIS — F3289 Other specified depressive episodes: Secondary | ICD-10-CM | POA: Diagnosis present

## 2012-01-01 LAB — BASIC METABOLIC PANEL
BUN: 5 mg/dL — ABNORMAL LOW (ref 6–23)
Calcium: 8.8 mg/dL (ref 8.4–10.5)
Creatinine, Ser: 0.65 mg/dL (ref 0.50–1.10)
GFR calc Af Amer: >90 mL/min (ref >90)
GFR calc non Af Amer: >90 mL/min (ref >90)
Glucose, Bld: 98 mg/dL (ref 70–99)
Potassium: 3.4 mEq/L — ABNORMAL LOW (ref 3.5–5.1)

## 2012-01-01 LAB — CBC
HCT: 29.6 % — ABNORMAL LOW (ref 36.0–46.0)
MCH: 27 pg (ref 26.0–34.0)
MCHC: 33.4 g/dL (ref 30.0–36.0)
MCV: 80.9 fL (ref 78.0–100.0)
Platelets: 302 10*3/uL (ref 150–400)
RDW: 13.6 % (ref 11.5–15.5)

## 2012-01-01 MED ORDER — PROMETHAZINE HCL 25 MG RE SUPP: 25.0000 mg | Freq: Four times a day (QID) | RECTAL | Status: DC | PRN

## 2012-01-01 MED ORDER — ONDANSETRON HCL 4 MG/2ML IJ SOLN
4.0000 mg | Freq: Four times a day (QID) | INTRAMUSCULAR | Status: DC | PRN
  Administered 2012-01-01: 4 mg via INTRAVENOUS
  Filled 2012-01-01 (×2): qty 2

## 2012-01-01 MED ORDER — PROMETHAZINE HCL 25 MG PO TABS
25.0000 mg | ORAL_TABLET | Freq: Four times a day (QID) | ORAL | Status: DC | PRN
  Administered 2012-01-01: 25 mg via ORAL
  Filled 2012-01-01: qty 1

## 2012-01-01 MED ORDER — CLONAZEPAM 0.5 MG PO TABS
0.5000 mg | ORAL_TABLET | Freq: Every evening | ORAL | Status: DC | PRN
  Administered 2012-01-01 – 2012-01-02 (×2): 0.5 mg via ORAL
  Filled 2012-01-01 (×2): qty 1

## 2012-01-01 MED ORDER — ACETAMINOPHEN 650 MG RE SUPP: 650.0000 mg | Freq: Four times a day (QID) | RECTAL | Status: DC | PRN

## 2012-01-01 MED ORDER — LEVOTHYROXINE SODIUM 150 MCG PO TABS
150.0000 ug | ORAL_TABLET | Freq: Every day | ORAL | Status: DC
  Administered 2012-01-03: 150 ug via ORAL
  Filled 2012-01-01 (×4): qty 1

## 2012-01-01 MED ORDER — PROMETHAZINE HCL 25 MG/ML IJ SOLN: 12.5000 mg | Freq: Four times a day (QID) | INTRAMUSCULAR | Status: DC | PRN

## 2012-01-01 MED ORDER — DIPHENHYDRAMINE HCL 50 MG/ML IJ SOLN: 12.5000 mg | Freq: Four times a day (QID) | INTRAMUSCULAR | Status: DC | PRN

## 2012-01-01 MED ORDER — HEPARIN SODIUM (PORCINE) 5000 UNIT/ML IJ SOLN
5000.0000 [IU] | Freq: Three times a day (TID) | INTRAMUSCULAR | Status: DC
  Administered 2012-01-01 – 2012-01-03 (×5): 5000 [IU] via SUBCUTANEOUS
  Filled 2012-01-01 (×8): qty 1

## 2012-01-01 MED ORDER — PROMETHAZINE HCL 25 MG/ML IJ SOLN
25.0000 mg | Freq: Once | INTRAMUSCULAR | Status: AC
  Administered 2012-01-01: 25 mg via INTRAMUSCULAR

## 2012-01-01 MED ORDER — INFLUENZA VIRUS VACC SPLIT PF IM SUSP
0.5000 mL | INTRAMUSCULAR | Status: AC
Start: 2012-01-02 — End: 2012-01-03
  Filled 2012-01-01: qty 0.5

## 2012-01-01 MED ORDER — IOHEXOL 300 MG/ML  SOLN
80.0000 mL | Freq: Once | INTRAMUSCULAR | Status: AC | PRN
  Administered 2012-01-01: 100 mL via INTRAVENOUS

## 2012-01-01 MED ORDER — DOCUSATE SODIUM 100 MG PO CAPS
100.0000 mg | ORAL_CAPSULE | Freq: Two times a day (BID) | ORAL | Status: DC
  Administered 2012-01-01 – 2012-01-03 (×3): 100 mg via ORAL
  Filled 2012-01-01 (×5): qty 1

## 2012-01-01 MED ORDER — FLUTICASONE-SALMETEROL 250-50 MCG/DOSE IN AEPB
1.0000 | INHALATION_SPRAY | Freq: Every day | RESPIRATORY_TRACT | Status: DC
  Administered 2012-01-01 – 2012-01-03 (×3): 1 via RESPIRATORY_TRACT
  Filled 2012-01-01: qty 14

## 2012-01-01 MED ORDER — PANTOPRAZOLE SODIUM 40 MG PO TBEC
40.0000 mg | DELAYED_RELEASE_TABLET | Freq: Every day | ORAL | Status: DC
  Administered 2012-01-01 – 2012-01-03 (×2): 40 mg via ORAL
  Filled 2012-01-01 (×3): qty 1

## 2012-01-01 MED ORDER — DIPHENHYDRAMINE HCL 12.5 MG/5ML PO ELIX
12.5000 mg | ORAL_SOLUTION | Freq: Four times a day (QID) | ORAL | Status: DC | PRN
  Administered 2012-01-03: 12.5 mg via ORAL
  Filled 2012-01-01: qty 5

## 2012-01-01 MED ORDER — ACETAMINOPHEN 325 MG PO TABS
650.0000 mg | ORAL_TABLET | Freq: Four times a day (QID) | ORAL | Status: DC | PRN
  Administered 2012-01-01: 650 mg via ORAL

## 2012-01-01 MED ORDER — SERTRALINE HCL 100 MG PO TABS
200.0000 mg | ORAL_TABLET | Freq: Every day | ORAL | Status: DC
  Administered 2012-01-01 – 2012-01-03 (×2): 200 mg via ORAL
  Filled 2012-01-01 (×3): qty 2

## 2012-01-01 MED ORDER — ACETAMINOPHEN 325 MG PO TABS
ORAL_TABLET | ORAL | Status: AC
  Filled 2012-01-01: qty 2

## 2012-01-01 MED ORDER — KCL IN DEXTROSE-NACL 20-5-0.45 MEQ/L-%-% IV SOLN
INTRAVENOUS | Status: DC
  Administered 2012-01-01 – 2012-01-02 (×2): via INTRAVENOUS
  Filled 2012-01-01 (×4): qty 1000

## 2012-01-01 MED ORDER — HYDROMORPHONE HCL PF 1 MG/ML IJ SOLN
1.0000 mg | INTRAMUSCULAR | Status: DC | PRN
  Administered 2012-01-01 – 2012-01-02 (×4): 1 mg via INTRAVENOUS
  Filled 2012-01-01 (×3): qty 1

## 2012-01-01 NOTE — Assessment & Plan Note (Addendum)
It is a shame that we haven't been able to get the CT scan yet. Hopefully after the Phenergan, and IV fluids she able to tolerate the oral contrast for the CT scan. I'll see her back after the CAT scan. Again, if nothing is clear in terms of diagnosis from the CT scan, I would like her to see gastroenterologist for consideration of upper, and lower endoscopy.  Her CT scan showed what appeared to be enlargement of the appendix with possible mucocele, but it was difficult to discern whether this may represent a mucinous neoplasm.  In light of her symptoms, inability to tolerate by mouth, I did discuss the case with Dr. Magnus Ivan with [facility] surgery, she will go to [facility] for likely appendectomy. I discussed this with the patient.

## 2012-01-01 NOTE — ED Provider Notes (Signed)
MSE was initiated and I personally evaluated the patient and placed orders (if any) at  6:13 PM on January 01, 2012.  The patient appears stable so that the remainder of the MSE may be completed by another provider.  Pt with outpt ct scan showing appendicitis per patient.  Dr Magnus Ivan is expecting the patient.  Pt is stable.  Nursing staff will contact Dr Magnus Ivan.  Celene Kras, MD 01/01/12 [phone]

## 2012-01-01 NOTE — ED Notes (Signed)
Patient reports abdominal pain in the upper right quadrant.  She reports this has been an ongoing problem for the past 3-6 months and has gotten progressively worse within the past week.  She reports having nausea, vomiting and diarrhea that is not relieved by anything.  She has not been able to keep anything down since this episode started on Saturday.

## 2012-01-01 NOTE — H&P (Signed)
[name] is an 41 y.o. female.   Chief Complaint: Abd pain, nausea and vomiting HPI: Patient has had these symptoms for months but has been getting worse.  She states she lost 6 lbs over the last week due to inability to tolerate PO.  She reports generalized abd pain.  No changes in bowel habits.  She most recently underwent pelvic surgery ~2 mo ago.    Past Medical History  Diagnosis Date  . Asthma   . Depression   . Anxiety   . GERD (gastroesophageal reflux disease)   . Hypothyroidism   . Endometriosis   . History of sinus surgery 2010    MAXILLARY, ETHMOID, SPHENOID  . Obesity, Class III, BMI 40-49.9 (morbid obesity)   . SVD (spontaneous vaginal delivery)     x3  . Generalized headaches   . Adenomyosis 2013  . Kidney stones   . Complication of anesthesia     scoline pain? from use of Succinylcholine     Past Surgical History  Procedure Date  . Knee surgery   . Nasal sinus surgery 2010  . Colposcopy 2007  . Laparoscopy 05/30/2011    Procedure: LAPAROSCOPY OPERATIVE;  Surgeon: Juluis Mire, MD;  Location: [facility];  Service: Gynecology;  Laterality: N/A;  YAG  LASER of Endometriosis.  . Laparoscopic assisted vaginal hysterectomy 11/13/2011    Procedure: LAPAROSCOPIC ASSISTED VAGINAL HYSTERECTOMY;  Surgeon: Juluis Mire, MD;  Location: [facility];  Service: Gynecology;  Laterality: N/A;  . Abdominal hysterectomy     Family History  Problem Relation Age of Onset  . Cancer Father     lung  . Alcohol abuse Father   . Cancer Other   . Depression Mother   . Depression Sister   . Suicidality Brother   . Depression Brother   . Alcohol abuse Brother   . Drug abuse Brother   . Depression Sister   . Anesthesia problems Neg Hx    Social History:  reports that she has never smoked. She does not have any smokeless tobacco history on file. She reports that she does not drink alcohol or use illicit drugs.  Allergies:  Allergies  Allergen Reactions  . Ciprofloxacin Nausea  And Vomiting  . Clarithromycin Hives  . Moxifloxacin Hives  . Reglan (Metoclopramide) Other (See Comments)    Caused a dystonic reaction.    No results found for this or any previous visit (from the past 48 hour(s)). Ct Abdomen Pelvis W Contrast  01/01/2012  *RADIOLOGY REPORT*  Clinical Data: Nausea and vomiting.  Abdominal pain.  History of endometriosis and renal stones.  Status post laparoscopic hysterectomy.  CT ABDOMEN AND PELVIS WITH CONTRAST  Technique:  Multidetector CT imaging of the abdomen and pelvis was performed following the standard protocol during bolus administration of intravenous contrast.  Contrast: OMNIPAQUE IOHEXOL 300 MG/ML  SOLN  Comparison: CT urogram from 02/04/2011  Findings: The liver is diffusely fatty infiltrated and measures 19.7 cm in cranial caudal length, enlarged.  No focal abnormalities seen in the liver or spleen.  The stomach, duodenum, pancreas, gallbladder, adrenal glands and left kidney are unremarkable. A pair of tiny 1-2 mm stones are seen in the upper pole of the right kidney (image 28). The previous exam showed other extremely tiny bilateral renal stones which are obscured by the IV contrast on today's study.  No abdominal aortic aneurysm.  No free fluid or lymphadenopathy in the abdomen.  Imaging through the pelvis shows no free intraperitoneal  fluid. Bladder is decompressed.  Uterus is surgically absent.  There is no adnexal mass.  No colonic diverticulitis.  The terminal ileum is normal.  The appendix measures up to 12 mm in diameter.  In the central portion of the appendix, the lumen is filled with soft tissue attenuating material.  Bone windows reveal no worrisome lytic or sclerotic osseous lesions.  IMPRESSION: Abnormal appendix measures 12 mm in diameter, has a somewhat lobular contour, and the lumen is filled with soft tissue attenuation.  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  Surgical consultation may be indicated.  Fatty, enlarged liver.  I discussed these findings by telephone with Dr. [name] at approximately 1330 hours on 01/01/2012.   Original Report Authenticated By: ERIC A. MANSELL, M.D.     Review of Systems  Constitutional: Positive for weight loss and malaise/fatigue. Negative for fever and chills.  Eyes: Negative for blurred vision.  Respiratory: Negative for cough and shortness of breath.   Cardiovascular: Negative for chest pain.  Gastrointestinal: Positive for nausea, vomiting and abdominal pain. Negative for blood in stool.  Genitourinary: Negative for dysuria and flank pain.  Skin: Negative for itching and rash.  Neurological: Negative for dizziness, focal weakness and headaches.  Endo/Heme/Allergies: Does not bruise/bleed easily.    Blood pressure 140/87, pulse 99, temperature 99 F (37.2 C), temperature source Oral, resp. rate 25, last menstrual period 11/02/2011, SpO2 99.00%. Physical Exam  Constitutional: She appears well-developed and well-nourished.  HENT:  Head: Normocephalic and atraumatic.  Eyes: Pupils are equal, round, and reactive to light.  Neck: Normal range of motion.  Cardiovascular: Normal rate and regular rhythm.   Respiratory: Effort normal and breath sounds normal.  GI: Soft. She exhibits no mass. There is tenderness in the right lower quadrant and suprapubic area. There is no rigidity, no rebound and no guarding.  Skin: Skin is warm and dry.     Assessment/Plan [name] who presents to the ED after an abnormal CT scan.  This shows an possible mucocele.  Given her symptoms and inability to tolerate PO, we will admit her tonight for hydration.  I will obtain some basic labs and a CEA level.  [name] for possible appendectomy in the AM.  [name] C. 01/01/2012, 7:56 PM

## 2012-01-01 NOTE — ED Notes (Signed)
ZOX:[id number]<AV> Expected date:01/01/12<BR> Expected time: 5:41 PM<BR> Means of arrival:<BR> Comments:<BR> Triage 4 pt of dr. Magnus Ivan

## 2012-01-01 NOTE — Progress Notes (Signed)
Subjective:    CC: Abdominal pain, nausea, vomiting, diarrhea.  HPI: [name] returns to see [place] with worsening of her abdominal pain, nausea, vomiting, and diarrhea. She's had a multitude of tests done, which are listed in the overview section. I have wanted a CT scan of the IV, and by mouth contrast, however she has missed multiple appointments for this. She notes that she has difficulty swallowing the contrast material. Today she hasn't eaten in a couple of days. She feels weak, and nauseated. She does feel like she might be able to try the CT scan today.  Past medical history, Surgical history, Family history, Social history, Allergies, and medications have been entered into the medical record, reviewed, and no changes needed.   Review of Systems: No fevers, chills, night sweats, weight loss, chest pain, or shortness of breath.   Objective:    General: Well Developed, well nourished, and in no acute distress.  Neuro: Alert and oriented x3, extra-ocular muscles intact.  HEENT: Normocephalic, atraumatic, pupils equal round reactive to light, neck supple, no masses, no lymphadenopathy, thyroid nonpalpable.  Skin: Warm and dry, no rashes. Cardiac: Regular rate and rhythm, no murmurs rubs or gallops.  Respiratory: Clear to auscultation bilaterally. Not using accessory muscles, speaking in full sentences. Abdomen: There is tenderness to palpation somewhat diffusely.  Phenergan 25 mg given intramuscularly. 1 L of normal saline given intravenously, I placed the IV myself with a 20 gauge angiocath in the office. We left the 20g angiocath in the left hand in anticipation of IV contrast for the CT.  She also finished one bottle of the oral contrast here in the office.  I spent over 1h with this patient, >50% was face to face.  Impression and Recommendations:

## 2012-01-01 NOTE — Addendum Note (Signed)
Addended by: Chalmers Cater on: 01/01/2012 11:52 AM   Modules accepted: Orders

## 2012-01-02 ENCOUNTER — Other Ambulatory Visit (HOSPITAL_BASED_OUTPATIENT_CLINIC_OR_DEPARTMENT_OTHER): Payer: Self-pay

## 2012-01-02 ENCOUNTER — Encounter (HOSPITAL_COMMUNITY): Admission: EM | Disposition: A | Discharge status: Home / Self Care | Payer: Self-pay | Source: Home / Self Care

## 2012-01-02 ENCOUNTER — Encounter (HOSPITAL_COMMUNITY): Payer: Self-pay | Admitting: Anesthesiology

## 2012-01-02 ENCOUNTER — Inpatient Hospital Stay (HOSPITAL_COMMUNITY): Payer: 59 | Admitting: Anesthesiology

## 2012-01-02 DIAGNOSIS — D126 Benign neoplasm of colon, unspecified: Secondary | ICD-10-CM

## 2012-01-02 HISTORY — PX: LAPAROSCOPIC APPENDECTOMY: SHX408

## 2012-01-02 SURGERY — APPENDECTOMY, LAPAROSCOPIC
Anesthesia: General | Site: Abdomen | Wound class: Clean Contaminated

## 2012-01-02 MED ORDER — LACTATED RINGERS IR SOLN
Status: DC | PRN
  Administered 2012-01-02: 1000 mL

## 2012-01-02 MED ORDER — KETAMINE HCL 10 MG/ML IJ SOLN
INTRAMUSCULAR | Status: DC | PRN
  Administered 2012-01-02 (×3): 2 mg via INTRAVENOUS
  Administered 2012-01-02: 25 mg via INTRAVENOUS

## 2012-01-02 MED ORDER — ONDANSETRON HCL 4 MG/2ML IJ SOLN
INTRAMUSCULAR | Status: DC | PRN
  Administered 2012-01-02: 4 mg via INTRAVENOUS

## 2012-01-02 MED ORDER — FENTANYL CITRATE 0.05 MG/ML IJ SOLN
INTRAMUSCULAR | Status: DC | PRN
  Administered 2012-01-02 (×2): 50 ug via INTRAVENOUS
  Administered 2012-01-02: 100 ug via INTRAVENOUS

## 2012-01-02 MED ORDER — ACETAMINOPHEN 10 MG/ML IV SOLN
INTRAVENOUS | Status: DC | PRN
  Administered 2012-01-02: 1000 mg via INTRAVENOUS

## 2012-01-02 MED ORDER — PROMETHAZINE HCL 25 MG/ML IJ SOLN: 6.2500 mg | INTRAMUSCULAR | Status: DC | PRN

## 2012-01-02 MED ORDER — HYDROMORPHONE HCL PF 1 MG/ML IJ SOLN
1.0000 mg | INTRAMUSCULAR | Status: DC | PRN
  Administered 2012-01-02 – 2012-01-03 (×5): 1 mg via INTRAVENOUS
  Filled 2012-01-02 (×5): qty 1

## 2012-01-02 MED ORDER — LACTATED RINGERS IV SOLN
INTRAVENOUS | Status: DC
  Administered 2012-01-02: 1000 mL via INTRAVENOUS
  Administered 2012-01-02: 13:00:00 via INTRAVENOUS

## 2012-01-02 MED ORDER — LACTATED RINGERS IV SOLN
INTRAVENOUS | Status: DC | PRN
  Administered 2012-01-02 (×2): via INTRAVENOUS

## 2012-01-02 MED ORDER — PROPOFOL 10 MG/ML IV BOLUS
INTRAVENOUS | Status: DC | PRN
  Administered 2012-01-02: 200 mg via INTRAVENOUS

## 2012-01-02 MED ORDER — 0.9 % SODIUM CHLORIDE (POUR BTL) OPTIME
TOPICAL | Status: DC | PRN
  Administered 2012-01-02: 1000 mL

## 2012-01-02 MED ORDER — SUCCINYLCHOLINE CHLORIDE 20 MG/ML IJ SOLN
INTRAMUSCULAR | Status: DC | PRN
  Administered 2012-01-02: 100 mg via INTRAVENOUS

## 2012-01-02 MED ORDER — KETOROLAC TROMETHAMINE 30 MG/ML IJ SOLN
30.0000 mg | Freq: Three times a day (TID) | INTRAMUSCULAR | Status: AC
  Administered 2012-01-02 – 2012-01-03 (×3): 30 mg via INTRAVENOUS
  Filled 2012-01-02 (×5): qty 1

## 2012-01-02 MED ORDER — ROCURONIUM BROMIDE 100 MG/10ML IV SOLN
INTRAVENOUS | Status: DC | PRN
  Administered 2012-01-02: 10 mg via INTRAVENOUS
  Administered 2012-01-02: 30 mg via INTRAVENOUS
  Administered 2012-01-02: 10 mg via INTRAVENOUS

## 2012-01-02 MED ORDER — LACTATED RINGERS IV SOLN
INTRAVENOUS | Status: AC
  Administered 2012-01-02 – 2012-01-03 (×2): via INTRAVENOUS

## 2012-01-02 MED ORDER — CEFAZOLIN SODIUM-DEXTROSE 2-3 GM-% IV SOLR
2.0000 g | INTRAVENOUS | Status: AC
  Administered 2012-01-02: 2 g via INTRAVENOUS
  Filled 2012-01-02: qty 50

## 2012-01-02 MED ORDER — NEOSTIGMINE METHYLSULFATE 1 MG/ML IJ SOLN
INTRAMUSCULAR | Status: DC | PRN
  Administered 2012-01-02: 4 mg via INTRAVENOUS

## 2012-01-02 MED ORDER — SCOPOLAMINE 1 MG/3DAYS TD PT72
MEDICATED_PATCH | TRANSDERMAL | Status: DC | PRN
  Administered 2012-01-02: 1 via TRANSDERMAL

## 2012-01-02 MED ORDER — DEXAMETHASONE SODIUM PHOSPHATE 4 MG/ML IJ SOLN
INTRAMUSCULAR | Status: DC | PRN
  Administered 2012-01-02: 10 mg via INTRAVENOUS

## 2012-01-02 MED ORDER — GLYCOPYRROLATE 0.2 MG/ML IJ SOLN
INTRAMUSCULAR | Status: DC | PRN
  Administered 2012-01-02: .8 mg via INTRAVENOUS

## 2012-01-02 MED ORDER — BUPIVACAINE HCL (PF) 0.5 % IJ SOLN
INTRAMUSCULAR | Status: DC | PRN
  Administered 2012-01-02: 20 mL

## 2012-01-02 MED ORDER — OXYCODONE-ACETAMINOPHEN 5-325 MG PO TABS
1.0000 | ORAL_TABLET | ORAL | Status: DC | PRN
  Administered 2012-01-02 – 2012-01-03 (×5): 2 via ORAL
  Filled 2012-01-02 (×5): qty 2

## 2012-01-02 MED ORDER — KETOROLAC TROMETHAMINE 30 MG/ML IJ SOLN
INTRAMUSCULAR | Status: DC | PRN
  Administered 2012-01-02: 30 mg via INTRAVENOUS

## 2012-01-02 MED ORDER — ONDANSETRON HCL 4 MG/2ML IJ SOLN: 4.0000 mg | Freq: Four times a day (QID) | INTRAMUSCULAR | Status: DC | PRN

## 2012-01-02 MED ORDER — HYDROMORPHONE HCL PF 1 MG/ML IJ SOLN
INTRAMUSCULAR | Status: AC
  Filled 2012-01-02: qty 1

## 2012-01-02 MED ORDER — HYDROMORPHONE HCL PF 1 MG/ML IJ SOLN
0.2500 mg | INTRAMUSCULAR | Status: DC | PRN
Start: 2012-01-02 — End: 2012-01-02
  Administered 2012-01-02 (×4): 0.5 mg via INTRAVENOUS

## 2012-01-02 MED ORDER — HYDROMORPHONE HCL PF 1 MG/ML IJ SOLN
INTRAMUSCULAR | Status: DC | PRN
  Administered 2012-01-02 (×2): 1 mg via INTRAVENOUS

## 2012-01-02 SURGICAL SUPPLY — 30 items
APPLIER CLIP 5 13 M/L LIGAMAX5 (MISCELLANEOUS)
BANDAGE ADHESIVE 1X3 (GAUZE/BANDAGES/DRESSINGS) ×6 IMPLANT
BENZOIN TINCTURE PRP APPL 2/3 (GAUZE/BANDAGES/DRESSINGS) ×2 IMPLANT
CANISTER SUCTION 2500CC (MISCELLANEOUS) ×2 IMPLANT
CHLORAPREP W/TINT 26ML (MISCELLANEOUS) ×2 IMPLANT
CLIP APPLIE 5 13 M/L LIGAMAX5 (MISCELLANEOUS) IMPLANT
CLOTH BEACON ORANGE TIMEOUT ST (SAFETY) ×2 IMPLANT
CUTTER FLEX LINEAR 45M (STAPLE) ×2 IMPLANT
DECANTER SPIKE VIAL GLASS SM (MISCELLANEOUS) ×2 IMPLANT
DRAPE LAPAROSCOPIC ABDOMINAL (DRAPES) ×2 IMPLANT
ELECT REM PT RETURN 9FT ADLT (ELECTROSURGICAL) ×2
ELECTRODE REM PT RTRN 9FT ADLT (ELECTROSURGICAL) ×1 IMPLANT
GLOVE SURG SIGNA 7.5 PF LTX (GLOVE) ×4 IMPLANT
GOWN STRL NON-REIN LRG LVL3 (GOWN DISPOSABLE) ×2 IMPLANT
GOWN STRL REIN XL XLG (GOWN DISPOSABLE) ×4 IMPLANT
KIT BASIN OR (CUSTOM PROCEDURE TRAY) ×2 IMPLANT
POUCH SPECIMEN RETRIEVAL 10MM (ENDOMECHANICALS) IMPLANT
RELOAD 45 VASCULAR/THIN (ENDOMECHANICALS) IMPLANT
RELOAD STAPLE TA45 3.5 REG BLU (ENDOMECHANICALS) ×2 IMPLANT
SCALPEL HARMONIC ACE (MISCELLANEOUS) ×2 IMPLANT
SET IRRIG TUBING LAPAROSCOPIC (IRRIGATION / IRRIGATOR) ×2 IMPLANT
SOLUTION ANTI FOG 6CC (MISCELLANEOUS) ×2 IMPLANT
STRIP CLOSURE SKIN 1/2X4 (GAUZE/BANDAGES/DRESSINGS) ×2 IMPLANT
SUT MNCRL AB 4-0 PS2 18 (SUTURE) ×2 IMPLANT
TOWEL OR 17X26 10 PK STRL BLUE (TOWEL DISPOSABLE) ×4 IMPLANT
TRAY FOLEY CATH 14FRSI W/METER (CATHETERS) IMPLANT
TRAY LAP CHOLE (CUSTOM PROCEDURE TRAY) ×2 IMPLANT
TROCAR BLADELESS OPT 5 75 (ENDOMECHANICALS) ×4 IMPLANT
TROCAR XCEL BLUNT TIP 100MML (ENDOMECHANICALS) IMPLANT
TUBING INSUFFLATION 10FT LAP (TUBING) ×2 IMPLANT

## 2012-01-02 NOTE — Preoperative (Signed)
Beta Blockers   Reason not to administer Beta Blockers:Not Applicable, not on home BB 

## 2012-01-02 NOTE — Progress Notes (Signed)
Patient ID: [name], female   DOB: 11-08-1970, 41 y.o.   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 Will proceed to the OR for Laparoscopic Appendectomy today.  I discussed the risks which include but are not limited to bleeding, infection, injury to surrounding structures, need to convert to an open procedure, stump leak, and the chance this may not resolve any of her symptoms.  She agrees to proceed.  Likelihood of success is moderate.

## 2012-01-02 NOTE — Op Note (Signed)
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 Her workup thus far has been unremarkable until yesterday when [name] CAT scan of the abdomen and pelvis Suggested [name] possible mass in the appendix. The CAT scan was otherwise unremarkable. The decision was made to proceed with laparoscopy and appendectomy.  Findings: The appendix was found to be retrocecal. The base was normal but the midportion and distal end were dilated and had [name] possible mass intraluminally. The rest of the abdomen including the gallbladder appeared normal. No other lesions were identified and the liver also appeared normal.  Procedure: The patient was brought to the operating room and identified as the correct patient. She was placed supine on the operating room table and general anesthesia was induced. Her abdomen was then prepped and draped in the usual sterile fashion. I made [name] small incision below the umbilicus through her previous scar. I carried this down to the fascia which was opened with [name] scalpel. [name] hemostat was then used to pass into the peritoneal cavity under direct vision. [name] 0 Vicryl purse string suture was then placed around the fascial opening. The [facility] port was placed in the opening and insufflation of the abdomen was begun. [name] 5 mm port was placed in the right upper quadrant and another 5 mm port was placed in  the patient's lower midline both under direct vision. Using the harmonic scalpel, the distal ileum was taken off of the lateral sidewall of the abdomen. The appendix was found to be retrocecal. I had to mobilize the right colon along the white line of [place]oldt with the harmonic scalpel. The mid and distal portions of the appendix were dilated with the suggestion of [name] mass or chronic inflammation. I took them and these appendix with the harmonic scalpel. The base of the appendix was normal and I transected it with the endoscopic GIA stapler. I took down the rest of the mesentery with the Harmonic Scalpel completely freeing the appendix. The appendix was then placed in an Endosac and removed through the incision at the umbilicus. I again evaluated the rest of the abdomen which appeared normal including the gallbladder and liver. The appendix was sent to pathology for evaluation. I thoroughly irrigated the right lower quadrant with normal saline. Hemostasis appeared to be achieved. All ports were removed under direct vision and the abdomen was deflated. [name] 0 Vicryl the umbilicus was tied in place closing the fascial defect. All incisions were anesthetized with Marcaine and closed with 4-0 Monocryl subcuticular sutures. Steri-Strips and Band-Aids were then applied. The patient tolerated the procedure well. All the counts were correct at the end of the procedure. The patient was then extubated in the operating room and taken in [name] stable condition to the recovery room. [name]   Date: 01/02/2012  Time: 12:04 PM

## 2012-01-02 NOTE — Interval H&P Note (Signed)
History and Physical Interval Note:  01/02/2012 10:29 AM  [name]  has presented today for surgery, with the diagnosis of appendicitis  The various methods of treatment have been discussed with the patient and family. After consideration of risks, benefits and other options for treatment, the patient has consented to  Procedure(s) (LRB) with comments: APPENDECTOMY LAPAROSCOPIC (N/A) as a surgical intervention .  The patient's history has been reviewed, patient examined, no change in status, stable for surgery.  I have reviewed the patient's chart and labs.  Questions were answered to the patient's satisfaction.     Colan Laymon A

## 2012-01-02 NOTE — Anesthesia Postprocedure Evaluation (Signed)
  Anesthesia Post-op Note  Patient: [name]  Procedure(s) Performed: Procedure(s) (LRB): APPENDECTOMY LAPAROSCOPIC (N/A)  Patient Location: PACU  Anesthesia Type: General  Level of Consciousness: awake and alert   Airway and Oxygen Therapy: Patient Spontanous Breathing  Post-op Pain: mild  Post-op Assessment: Post-op Vital signs reviewed, Patient's Cardiovascular Status Stable, Respiratory Function Stable, Patent Airway and No signs of Nausea or vomiting  Post-op Vital Signs: stable  Complications: No apparent anesthesia complications

## 2012-01-02 NOTE — Transfer of Care (Signed)
Immediate Anesthesia Transfer of Care Note  Patient: [name]  Procedure(s) Performed: Procedure(s) (LRB) with comments: APPENDECTOMY LAPAROSCOPIC (N/A)  Patient Location: PACU  Anesthesia Type: General  Level of Consciousness: awake, alert , oriented and patient cooperative  Airway & Oxygen Therapy: Patient Spontanous Breathing and Patient connected to face mask oxygen  Post-op Assessment: Report given to PACU RN, Post -op Vital signs reviewed and stable and Patient moving all extremities X 4  Post vital signs: stable  Complications: No apparent anesthesia complications

## 2012-01-02 NOTE — Progress Notes (Signed)
INITIAL ADULT NUTRITION ASSESSMENT Date: 01/02/2012   Time: 3:50 PM Reason for Assessment: Nutrition risk   INTERVENTION: Diet advancement per MD. Will monitor.   Pt meets criteria for severe malnutrition of acute illness as evidenced by <50% estimated energy intake for the past 5 days with 2.2% weight loss during this time frame per pt report.   ASSESSMENT: Female 41 y.o.  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 Pt reports she has lost 6 pounds unintentionally during this time frame. Pt had appendectomy today. Pt reports she has been doing well afterwards, denies any nausea. Assisted pt with ordering lunch.   Hx:  Past Medical History  Diagnosis Date  . Asthma   . Depression   . Anxiety   . GERD (gastroesophageal reflux disease)   . Hypothyroidism   . Endometriosis   . History of sinus surgery 2010    MAXILLARY, ETHMOID, SPHENOID  . Obesity, Class III, BMI 40-49.9 (morbid obesity)   . SVD (spontaneous vaginal delivery)     x3  . Generalized headaches   . Adenomyosis 2013  . Kidney stones   . Complication of anesthesia     scoline pain? from use of Succinylcholine    Related Meds:  Scheduled Meds:   . acetaminophen      .  ceFAZolin (ANCEF) IV  2 g Intravenous 60 min Pre-Op  . docusate sodium  100 mg Oral BID  . Fluticasone-Salmeterol  1 puff Inhalation Daily  . heparin  5,000 Units Subcutaneous Q8H  . HYDROmorphone      . influenza  inactive virus vaccine  0.5 mL Intramuscular Tomorrow-1000  . ketorolac  30 mg Intravenous Q8H  . levothyroxine  150 mcg Oral QAC breakfast  . pantoprazole  40 mg Oral Q1200  . sertraline  200 mg Oral Daily   Continuous Infusions:   . dextrose 5 % and 0.45 % NaCl with KCl 20 mEq/L Stopped (01/02/12 1012)  . lactated ringers    . DISCONTD: lactated ringers 125 mL/hr at 01/02/12 1237   PRN  Meds:.acetaminophen, acetaminophen, clonazePAM, diphenhydrAMINE, diphenhydrAMINE, HYDROmorphone (DILAUDID) injection, ondansetron, oxyCODONE-acetaminophen, promethazine, promethazine, promethazine, DISCONTD: 0.9 % irrigation (POUR BTL), DISCONTD: bupivacaine, DISCONTD:  HYDROmorphone (DILAUDID) injection, DISCONTD:  HYDROmorphone (DILAUDID) injection, DISCONTD: lactated ringers, DISCONTD: ondansetron, DISCONTD: promethazine  Ht: 5' 4.5" (163.8 cm) (5'4.5")  Wt: 260 lb (117.935 kg)  Ideal Wt: 120 lb % Ideal Wt: 217  Usual Wt: 266 lb per pt report % Usual Wt: 97  Body mass index is 43.94 kg/(m^2). Class III extreme obesity  Labs:  CMP     Component Value Date/Time   NA 134* 01/01/2012 2200   K 3.4* 01/01/2012 2200   CL 100 01/01/2012 2200   CO2 23 01/01/2012 2200   GLUCOSE 98 01/01/2012 2200   BUN 5* 01/01/2012 2200   CREATININE 0.65 01/01/2012 2200   CREATININE 0.64 12/19/2011 1419   CALCIUM 8.8 01/01/2012 2200   PROT 6.9 12/19/2011 1419   ALBUMIN 3.9 12/19/2011 1419   AST 24 12/19/2011 1419   ALT 26 12/19/2011 1419   ALKPHOS 74 12/19/2011 1419   BILITOT 0.4 12/19/2011 1419   GFRNONAA >90 01/01/2012 2200   GFRAA >90 01/01/2012 2200    Intake/Output Summary (Last 24 hours) at 01/02/12 1557 Last data filed at 01/02/12 1400  Gross per 24 hour  Intake 2193.75 ml  Output   1050 ml  Net 1143.75 ml   Last BM - 9/17   Diet Order: Clear Liquid   IVF:    dextrose 5 % and 0.45 % NaCl with KCl 20 mEq/L Last Rate: Stopped (01/02/12 1012)  lactated ringers   DISCONTD: lactated ringers Last Rate: 125 mL/hr at 01/02/12 1237    Estimated Nutritional Needs:   Kcal:1600-1900 Protein:65-80g Fluid:1.6-1.9L  NUTRITION DIAGNOSIS: -Inadequate oral intake (NI-2.1).  Status: Ongoing  RELATED TO: appendectomy earlier today  AS EVIDENCE BY: clear liquid diet  MONITORING/EVALUATION(Goals): Advance diet as tolerated to regular diet.   EDUCATION NEEDS: -No education needs identified at this  time   Dietitian #: [phone]  DOCUMENTATION CODES Per approved criteria  -Severe malnutrition in the context of acute illness or injury -Morbid Obesity    Marshall Cork 01/02/2012, 3:50 PM

## 2012-01-02 NOTE — Anesthesia Preprocedure Evaluation (Signed)
Anesthesia Evaluation  Patient identified by MRN, date of birth, ID band Patient awake    Reviewed: Allergy & Precautions, H&P , NPO status , Patient's Chart, lab work & pertinent test results  Airway Mallampati: II TM Distance: >3 FB Neck ROM: Full    Dental No notable dental hx.    Pulmonary asthma ,  breath sounds clear to auscultation  Pulmonary exam normal       Cardiovascular negative cardio ROS  Rhythm:Regular Rate:Normal     Neuro/Psych  Headaches, PSYCHIATRIC DISORDERS Anxiety Depression    GI/Hepatic Neg liver ROS, GERD-  Medicated,  Endo/Other  Hypothyroidism Morbid obesity  Renal/GU Renal disease  negative genitourinary   Musculoskeletal negative musculoskeletal ROS (+)   Abdominal (+) + obese,   Peds negative pediatric ROS (+)  Hematology negative hematology ROS (+)   Anesthesia Other Findings   Reproductive/Obstetrics negative OB ROS                           Anesthesia Physical Anesthesia Plan  ASA: III  Anesthesia Plan: General   Post-op Pain Management:    Induction: Intravenous  Airway Management Planned:   Additional Equipment:   Intra-op Plan:   Post-operative Plan: Extubation in OR  Informed Consent: I have reviewed the patients History and Physical, chart, labs and discussed the procedure including the risks, benefits and alternatives for the proposed anesthesia with the patient or authorized representative who has indicated his/her understanding and acceptance.   Dental advisory given  Plan Discussed with: CRNA  Anesthesia Plan Comments:         Anesthesia Quick Evaluation

## 2012-01-03 ENCOUNTER — Encounter (HOSPITAL_COMMUNITY): Payer: Self-pay | Admitting: Surgery

## 2012-01-03 MED ORDER — OXYCODONE-ACETAMINOPHEN 7.5-325 MG PO TABS: 1.0000 | ORAL_TABLET | ORAL | Status: DC | PRN

## 2012-01-03 NOTE — Progress Notes (Signed)
I have seen and examined the patient and agree with the assessment and plans.  Shatyra Becka A. Edinson Domeier  MD, FACS  

## 2012-01-03 NOTE — Progress Notes (Signed)
Utilization review completed.  

## 2012-01-03 NOTE — Progress Notes (Signed)
Patient ID: [name], female   DOB: 1970-09-29, 41 y.o.   MRN: [id number] 1 Day Post-Op  Subjective: Pt feels better today, c/o pain just left lateral of umbilical incision.  Tolerating a regular diet.  No nausea  Objective: Vital signs in last 24 hours: Temp:  [97.2 F (36.2 C)-98.4 F (36.9 C)] 97.7 F (36.5 C) (09/20 0620) Pulse Rate:  [73-94] 85  (09/20 0620) Resp:  [9-20] 18  (09/20 0620) BP: (98-125)/(54-70) 101/63 mmHg (09/20 0620) SpO2:  [95 %-100 %] 97 % (09/20 0620) Last BM Date: 12/31/11  Intake/Output from previous day: 09/19 0701 - 09/20 0700 In: 3351.3 [P.O.:840; I.V.:2511.3] Out: 1900 [Urine:1900] Intake/Output this shift:    PE: Abd: soft, appropriately tender, +BS, ND, incisions c/d/i Ht: regular  Lab Results:   Basename 01/01/12 2200  WBC 7.9  HGB 9.9*  HCT 29.6*  PLT 302   BMET  Basename 01/01/12 2200  NA 134*  K 3.4*  CL 100  CO2 23  GLUCOSE 98  BUN 5*  CREATININE 0.65  CALCIUM 8.8   PT/INR No results found for this basename: LABPROT:2,INR:2 in the last 72 hours CMP     Component Value Date/Time   NA 134* 01/01/2012 2200   K 3.4* 01/01/2012 2200   CL 100 01/01/2012 2200   CO2 23 01/01/2012 2200   GLUCOSE 98 01/01/2012 2200   BUN 5* 01/01/2012 2200   CREATININE 0.65 01/01/2012 2200   CREATININE 0.64 12/19/2011 1419   CALCIUM 8.8 01/01/2012 2200   PROT 6.9 12/19/2011 1419   ALBUMIN 3.9 12/19/2011 1419   AST 24 12/19/2011 1419   ALT 26 12/19/2011 1419   ALKPHOS 74 12/19/2011 1419   BILITOT 0.4 12/19/2011 1419   GFRNONAA >90 01/01/2012 2200   GFRAA >90 01/01/2012 2200   Lipase     Component Value Date/Time   LIPASE 62* 03/09/2011 0325       Studies/Results: Ct Abdomen Pelvis W Contrast  01/01/2012  *RADIOLOGY REPORT*  Clinical Data: Nausea and vomiting.  Abdominal pain.  History of endometriosis and renal stones.  Status post laparoscopic hysterectomy.  CT ABDOMEN AND PELVIS WITH CONTRAST  Technique:  Multidetector CT imaging of the  abdomen and pelvis was performed following the standard protocol during bolus administration of intravenous contrast.  Contrast: OMNIPAQUE IOHEXOL 300 MG/ML  SOLN  Comparison: CT urogram from 02/04/2011  Findings: The liver is diffusely fatty infiltrated and measures 19.7 cm in cranial caudal length, enlarged.  No focal abnormalities seen in the liver or spleen.  The stomach, duodenum, pancreas, gallbladder, adrenal glands and left kidney are unremarkable. A pair of tiny 1-2 mm stones are seen in the upper pole of the right kidney (image 28). The previous exam showed other extremely tiny bilateral renal stones which are obscured by the IV contrast on today's study.  No abdominal aortic aneurysm.  No free fluid or lymphadenopathy in the abdomen.  Imaging through the pelvis shows no free intraperitoneal fluid. Bladder is decompressed.  Uterus is surgically absent.  There is no adnexal mass.  No colonic diverticulitis.  The terminal ileum is normal.  The appendix measures up to 12 mm in diameter.  In the central portion of the appendix, the lumen is filled with soft tissue attenuating material.  Bone windows reveal no worrisome lytic or sclerotic osseous lesions.  IMPRESSION: Abnormal appendix measures 12 mm in diameter, has a somewhat lobular contour, and the lumen is filled with soft tissue attenuation.  Although is not  substantially changed since prior exam, the imaging features do raise concern for mucocele of the appendix or mucinous neoplasm.  Surgical consultation may be indicated.  Fatty, enlarged liver.  I discussed these findings by telephone with Dr. [name] at approximately 1330 hours on 01/01/2012.   Original Report Authenticated By: ERIC A. MANSELL, M.D.     Anti-infectives: Anti-infectives     Start     Dose/Rate Route Frequency Ordered Stop   01/02/12 0756   ceFAZolin (ANCEF) IVPB 2 g/50 mL premix        2 g 100 mL/hr over 30 Minutes Intravenous 60 min pre-op 01/02/12 0756 01/02/12  1117           Assessment/Plan  1. Appendiceal mass vs mucocele 2. S/p lap appy  Plan: 1. Continue with pain control.  Once pain controlled appropriately the patient should be stable for dc later today or tomorrow morning.   LOS: 2 days    [name] E 01/03/2012, 8:00 AM Pager: [phone]

## 2012-01-03 NOTE — Discharge Summary (Signed)
Physician Discharge Summary  Patient ID: [name] MRN: [id number] DOB/AGE: November 29, 1970 41 y.o.  Admit date: 01/01/2012 Discharge date: 01/03/2012  Admission Diagnoses: chronic abdominal pain uncertain etiology  Discharge Diagnoses: same Active Problems:  * No active hospital problems. *    Discharged Condition: good  Hospital Course: admitted.  Underwent lap appendectomy.  Uneventful post op recovery  Consults: None  Significant Diagnostic Studies: 0  Treatments: surgery: laparoscopic appendectomy  Discharge Exam: Blood pressure 105/73, pulse 73, temperature 97.9 F (36.6 C), temperature source Oral, resp. rate 18, height 5' 4.5" (1.638 m), weight 260 lb (117.935 kg), last menstrual period 11/02/2011, SpO2 99.00%. General appearance: alert, cooperative and no distress Incision/Wound: abdomen soft, dressings dry  Disposition: 01-Home or Self Care  Discharge Orders    Future Appointments: Provider: Department: Dept Phone: Center:   01/23/2012 1:30 PM Monica Becton, MD [facility] [phone] [facility]       Medication List     As of 01/03/2012  2:36 PM    TAKE these medications         cetirizine 10 MG tablet   Commonly known as: ZYRTEC   Take 10 mg by mouth daily. Patient uses this for her allergies.      clonazePAM 0.5 MG tablet   Commonly known as: KLONOPIN   Take one to two tablets at bedtime as needed for anxiety.      diphenoxylate-atropine 2.5-0.025 MG per tablet   Commonly known as: LOMOTIL   One to 2 tablets by mouth 4 times a day as needed for diarrhea.      esomeprazole 40 MG capsule   Commonly known as: NEXIUM   Take 1 capsule (40 mg total) by mouth daily.      Fluticasone-Salmeterol 250-50 MCG/DOSE Aepb   Commonly known as: ADVAIR   Inhale 1 puff into the lungs daily.      hyoscyamine 0.125 MG tablet   Commonly known as: LEVSIN, ANASPAZ   Take 1 tablet (0.125 mg total) by mouth every 4 (four) hours as needed for  cramping.      levothyroxine 150 MCG tablet   Commonly known as: SYNTHROID, LEVOTHROID   Take 1 tablet (150 mcg total) by mouth daily.      oxyCODONE-acetaminophen 7.5-325 MG per tablet   Commonly known as: PERCOCET   Take 1 tablet by mouth every 4 (four) hours as needed for pain.      sertraline 100 MG tablet   Commonly known as: ZOLOFT   Take 2 tablets (200 mg total) by mouth daily.      traMADol 50 MG tablet   Commonly known as: ULTRAM   Take 100 mg by mouth every 6 (six) hours as needed. PAIN      ASK your doctor about these medications         promethazine 25 MG tablet   Commonly known as: PHENERGAN   Take 1 tablet (25 mg total) by mouth every 6 (six) hours as needed for nausea.           Follow-up Information    Follow up with [facility], MD. Schedule an appointment as soon as possible for a visit in 2 weeks.   Contact information:   [address] [place] [address] [phone]          Signed: Shelly Rubenstein 01/03/2012, 2:36 PM

## 2012-01-06 ENCOUNTER — Encounter: Payer: Self-pay | Admitting: Sports Medicine

## 2012-01-07 ENCOUNTER — Telehealth: Payer: Self-pay | Admitting: *Deleted

## 2012-01-07 NOTE — Telephone Encounter (Signed)
Yes, I have been keeping up on the EMR regarding her progress. I do also note that the pathology result shows a benign tumor of the appendix. Hopefully this is the cause of her symptoms, and she should be better from now on.

## 2012-01-07 NOTE — Telephone Encounter (Signed)
Pt called asking if you received the surgeon notes yet so that you know what is going on.

## 2012-01-07 NOTE — Telephone Encounter (Signed)
Pt informed

## 2012-01-08 ENCOUNTER — Telehealth (INDEPENDENT_AMBULATORY_CARE_PROVIDER_SITE_OTHER): Payer: Self-pay

## 2012-01-08 DIAGNOSIS — G8918 Other acute postprocedural pain: Secondary | ICD-10-CM

## 2012-01-08 MED ORDER — HYDROCODONE-ACETAMINOPHEN 5-325 MG PO TABS: 1.0000 | ORAL_TABLET | Freq: Four times a day (QID) | ORAL | Status: DC | PRN

## 2012-01-08 NOTE — Telephone Encounter (Signed)
Patient called in requesting a refill on her pain medication.  Patient s/p Lap Appendectomy on 01/02/12.  Post operative pain medication protocol called in to [facility] @ [phone].  Vicodin 5/325 mg, take 1 po, q6hrs prn pain, #30, 0 refills.

## 2012-01-09 ENCOUNTER — Telehealth (HOSPITAL_COMMUNITY): Payer: Self-pay | Admitting: Psychiatry

## 2012-01-09 NOTE — Telephone Encounter (Signed)
Opened in error

## 2012-01-14 ENCOUNTER — Telehealth: Payer: Self-pay | Admitting: Internal Medicine

## 2012-01-14 ENCOUNTER — Ambulatory Visit (INDEPENDENT_AMBULATORY_CARE_PROVIDER_SITE_OTHER): Payer: Commercial Managed Care - PPO | Admitting: Surgery

## 2012-01-14 ENCOUNTER — Other Ambulatory Visit (INDEPENDENT_AMBULATORY_CARE_PROVIDER_SITE_OTHER): Payer: Self-pay | Admitting: Surgery

## 2012-01-14 ENCOUNTER — Encounter (INDEPENDENT_AMBULATORY_CARE_PROVIDER_SITE_OTHER): Payer: Self-pay | Admitting: Surgery

## 2012-01-14 VITALS — BP 138/84 | HR 80 | Temp 97.1°F | Resp 20 | Ht 64.5 in | Wt 266.8 lb

## 2012-01-14 DIAGNOSIS — D490 Neoplasm of unspecified behavior of digestive system: Secondary | ICD-10-CM

## 2012-01-14 DIAGNOSIS — C801 Malignant (primary) neoplasm, unspecified: Secondary | ICD-10-CM

## 2012-01-14 DIAGNOSIS — D373 Neoplasm of uncertain behavior of appendix: Secondary | ICD-10-CM | POA: Insufficient documentation

## 2012-01-14 DIAGNOSIS — Z09 Encounter for follow-up examination after completed treatment for conditions other than malignant neoplasm: Secondary | ICD-10-CM

## 2012-01-14 NOTE — Progress Notes (Signed)
Subjective:     Patient ID: [name], female   DOB: March 07, 1971, 41 y.o.   MRN: [id number]  HPI She is here for a postop visit status post laparoscopic appendectomy. She still has the same symptoms of right upper quadrant abdominal pain nausea and diarrhea. These are unchanged since the appendectomy.  Review of Systems     Objective:   Physical Exam On exam, her incisions are healing well. The final pathology on the appendix had a low-grade mucin neoplasm.    Assessment:     Low-grade mucin neoplasm of the appendix    Plan:     I discussed this with her in detail after surgery as well as again today. After discussion with oncologists and other surgeons, I am recommending a laparoscopic right partial colectomy. I believe she also needs a cholecystectomy as this is causing the majority of her symptoms. Because of the findings and her anemia and symptoms, I will refer her preoperatively to [facility] gastroenterology For preoperative upper and lower endoscopy.

## 2012-01-14 NOTE — Telephone Encounter (Signed)
Pt scheduled to see Mike Gip PA tomorrow morning at 9am. Pattricia Boss to notify pt of appt date and time, records in epic. Pt needs to have ECL prior to more surgery with Dr. Magnus Ivan.

## 2012-01-15 ENCOUNTER — Ambulatory Visit (INDEPENDENT_AMBULATORY_CARE_PROVIDER_SITE_OTHER): Payer: 59 | Admitting: Physician Assistant

## 2012-01-15 ENCOUNTER — Telehealth (INDEPENDENT_AMBULATORY_CARE_PROVIDER_SITE_OTHER): Payer: Self-pay

## 2012-01-15 ENCOUNTER — Encounter: Payer: Self-pay | Admitting: Physician Assistant

## 2012-01-15 ENCOUNTER — Other Ambulatory Visit (INDEPENDENT_AMBULATORY_CARE_PROVIDER_SITE_OTHER): Payer: 59

## 2012-01-15 ENCOUNTER — Other Ambulatory Visit (INDEPENDENT_AMBULATORY_CARE_PROVIDER_SITE_OTHER): Payer: Self-pay | Admitting: Surgery

## 2012-01-15 VITALS — BP 130/72 | HR 72 | Ht 63.5 in | Wt 267.4 lb

## 2012-01-15 DIAGNOSIS — D649 Anemia, unspecified: Secondary | ICD-10-CM

## 2012-01-15 DIAGNOSIS — R110 Nausea: Secondary | ICD-10-CM

## 2012-01-15 DIAGNOSIS — D490 Neoplasm of unspecified behavior of digestive system: Secondary | ICD-10-CM

## 2012-01-15 DIAGNOSIS — R1011 Right upper quadrant pain: Secondary | ICD-10-CM

## 2012-01-15 LAB — IBC PANEL: Iron: 29 ug/dL — ABNORMAL LOW (ref 42–145)

## 2012-01-15 NOTE — Patient Instructions (Addendum)
Please go to the basement level to have your labs drawn.  We have scheduled the Colonoscopy/ Endoscopy with Dr Russella Dar on 01-22-2012. Directions and brochure provided. We have provided a Moviprep sample for you.

## 2012-01-15 NOTE — Progress Notes (Signed)
Subjective:    Patient ID: [name], female    DOB: 02-16-1971, 41 y.o.   MRN: [id number]  HPI [name] is a pleasant 41 year old white female, new to GI today referred by Dr. Magnus Ivan. Patient is status post laparoscopic hysterectomy in July of 2013 for endometriosis. She underwent a laparoscopic appendectomy 2 weeks ago , and path showed a low grade mucinous neoplasm. Patient had been undergoing evaluation for ongoing complaints of right-sided abdominal pain and nausea and CT scan had revealed an abnormal-appearing appendix with soft tissue filled lumen . CT scan also should showed a diffusely fatty infiltrated liver. Gallbladder appeared normal. Dr. Magnus Ivan plans a right hemicolectomy and cholecystectomy and is requesting GI evaluation with endoscopy and colonoscopy preoperatively due to a chronic somewhat progressive anemia and ongoing complaints of right upper quadrant discomfort and nausea . Patient states that she has a long history of anemia and had been on iron supplements intermittently in the past but was never necessarily told that she had iron deficiency anemia. She says her hemoglobin is always been in the 10-11 range and recently has been in the 9 range . She has no family history of anemia, recent celiac panel was done and was negative she also had multiple stool studies all negative including a Hemoccult. Most recent labs done 01/01/2012 show a hemoglobin of 9.9 hematocrit 29.6 and an MCV of 80. Reviewing prior labs she had a hemoglobin of 11.9 hematocrit 35.1 in October of 2012 ,and in  January of 2009 hemoglobin was 9.7 hematocrit of 27.4 She is feeling okay postoperatively but complains of ongoing fatigue. She says she's been nauseated 24 7 her the past several months which is sometimes worse postprandially. She says occasionally she will vomit. She's been on Nexium 40 mg by mouth daily over the past year without any improvement in the nausea area just no complaints of  heartburn indigestion dysphagia or odynophagia. She's not using any regular aspirin or NSAIDs.  Family history is pertinent for maternal grandmother diagnosed with colon cancer in her 35s.    Review of Systems  Constitutional: Positive for fatigue.  HENT: Negative.   Eyes: Negative.   Respiratory: Negative.   Cardiovascular: Negative.   Gastrointestinal: Positive for nausea and abdominal pain.  Genitourinary: Negative.   Musculoskeletal: Negative.   Skin: Negative.   Neurological: Negative.   Hematological: Negative.   Psychiatric/Behavioral: Negative.    Outpatient Prescriptions Prior to Visit  Medication Sig Dispense Refill  . clonazePAM (KLONOPIN) 0.5 MG tablet Take one to two tablets at bedtime as needed for anxiety.  60 tablet  1  . esomeprazole (NEXIUM) 40 MG capsule Take 1 capsule (40 mg total) by mouth daily.  90 capsule  3  . Fluticasone-Salmeterol (ADVAIR) 250-50 MCG/DOSE AEPB Inhale 1 puff into the lungs daily.      Marland Kitchen HYDROcodone-acetaminophen (NORCO/VICODIN) 5-325 MG per tablet Take 1 tablet by mouth every 6 (six) hours as needed for pain.  30 tablet  0  . levothyroxine (SYNTHROID, LEVOTHROID) 150 MCG tablet Take 1 tablet (150 mcg total) by mouth daily.  90 tablet  1  . promethazine (PHENERGAN) 25 MG tablet Take 1 tablet (25 mg total) by mouth every 6 (six) hours as needed for nausea.  30 tablet  0  . sertraline (ZOLOFT) 100 MG tablet Take 2 tablets (200 mg total) by mouth daily.  60 tablet  1  . traMADol (ULTRAM) 50 MG tablet Take 100 mg by mouth every 6 (six) hours as needed. PAIN  50 tablet  2   Allergies  Allergen Reactions  . Ciprofloxacin Nausea And Vomiting  . Clarithromycin Hives  . Moxifloxacin Hives  . Reglan (Metoclopramide) Other (See Comments)    Caused a dystonic reaction.   Patient Active Problem List  Diagnosis  . Endometriosis  . Panic disorder without agoraphobia with mild panic attacks  . Depressed  . Hypothyroidism  . GERD (gastroesophageal  reflux disease)  . Appendiceal mucinous tumor, low grade dysplasia  . Asthma  . Normocytic anemia  . Low grade mucinous neoplasm of appendix   History   Social History  . Marital Status: Married    Spouse Name: N/A    Number of Children: 3  . Years of Education: N/A   Occupational History  . RN [employer]   Social History Main Topics  . Smoking status: Never Smoker   . Smokeless tobacco: Never Used  . Alcohol Use: No  . Drug Use: No  . Sexually Active: Yes -- Female partner(s)    Birth Control/ Protection: None   Other Topics Concern  . Not on file   Social History Narrative  . No narrative on file       Objective:   Physical Exam  the well-developed white female in no acute distress, pleasant blood pressure 130/72 pulse 72 height 5 foot 3 weight 267. HEENT; nontraumatic normocephalic EOMI PERRLA sclera anicteric,Neck; Supple no JVD, Cardiovascular; regular rate and rhythm with S1-S2 no murmur or gallop, Pulmonary; clear bilaterally, Abdomen; soft she has mild rather generalized tenderness more noticeable in the right upper quadrant there is no guarding no rebound no palpable mass or hepatosplenomegaly incisional ports are healing bowel sounds are present and Rectal; exam not done recent stool documented Hemoccult negative, Extremities; no clubbing cyanosis or edema skin warm and dry, Psych; mood and affect normal and appropriate.        Assessment & Plan:  #18  41 year old female with recent diagnosis of mucinous neoplasm of the appendix status post laparoscopic appendectomy 01/01/2012-surgical plan is for right hemicolectomy #2 several month history of persistent nausea and right-sided abdominal discomfort-suspected chronic cholecystitis or biliary dyskinesia, and surgical plan is also for cholecystectomy at the time of the right hemicolectomy #3 chronic normocytic anemia-unclear etiology. No current evidence for ongoing GI blood loss, but cannot rule out occult GI process  with intermittent blood loss. Will rule out iron deficiency, B12 and folate deficiency.. Recent celiac panel negative.  #4 obesity #5 fatty liver Plan; Will schedule for upper endoscopy with small bowel biopsies and colonoscopy with Dr. Russella Dar. Procedures discussed in detail with the patient and she is agreeable to proceed.. Will check iron studies, B12 and folate levels. If these are negative, and endoscopic workup is unrevealing she will need hematology evaluation for persistent anemia

## 2012-01-15 NOTE — Telephone Encounter (Signed)
Pt is scheduled for colonoscopy and endoscopy on 01/22/12 at 3:00pm by Dr. Laban Emperor.  [name] wanted to make Dr. Magnus Ivan aware so he could schedule her surgery.

## 2012-01-15 NOTE — Progress Notes (Signed)
I reviewed the records and discussed the management plan with Ms. [name] during the patient's office appointment today. I have reviewed her note and agree with the management plan.  Venita Lick. Russella Dar MD [facility]

## 2012-01-16 LAB — FERRITIN: Ferritin: 12.7 ng/mL (ref 10.0–291.0)

## 2012-01-16 LAB — FOLATE RBC

## 2012-01-17 ENCOUNTER — Encounter: Payer: Self-pay | Admitting: Sports Medicine

## 2012-01-17 ENCOUNTER — Other Ambulatory Visit: Payer: Self-pay

## 2012-01-17 DIAGNOSIS — E538 Deficiency of other specified B group vitamins: Secondary | ICD-10-CM | POA: Insufficient documentation

## 2012-01-17 DIAGNOSIS — D509 Iron deficiency anemia, unspecified: Secondary | ICD-10-CM

## 2012-01-17 MED ORDER — INTEGRA PLUS PO CAPS: 1.0000 | ORAL_CAPSULE | Freq: Every day | ORAL | Status: DC

## 2012-01-20 ENCOUNTER — Telehealth: Payer: Self-pay | Admitting: *Deleted

## 2012-01-20 ENCOUNTER — Ambulatory Visit (INDEPENDENT_AMBULATORY_CARE_PROVIDER_SITE_OTHER): Payer: 59 | Admitting: Sports Medicine

## 2012-01-20 ENCOUNTER — Encounter: Payer: Self-pay | Admitting: Sports Medicine

## 2012-01-20 VITALS — BP 153/95 | HR 92

## 2012-01-20 DIAGNOSIS — J019 Acute sinusitis, unspecified: Secondary | ICD-10-CM | POA: Insufficient documentation

## 2012-01-20 DIAGNOSIS — D649 Anemia, unspecified: Secondary | ICD-10-CM

## 2012-01-20 MED ORDER — CYANOCOBALAMIN 1000 MCG/ML IJ SOLN
1000.0000 ug | Freq: Once | INTRAMUSCULAR | Status: AC
  Administered 2012-01-20: 1000 ug via INTRAMUSCULAR

## 2012-01-20 MED ORDER — AMOXICILLIN-POT CLAVULANATE 875-125 MG PO TABS: 1.0000 | ORAL_TABLET | Freq: Two times a day (BID) | ORAL | Status: DC

## 2012-01-20 MED ORDER — FLUTICASONE PROPIONATE 50 MCG/ACT NA SUSP: NASAL | Status: DC

## 2012-01-20 MED ORDER — CYANOCOBALAMIN 1000 MCG/ML IJ SOLN: 1000.0000 ug | INTRAMUSCULAR | Status: DC

## 2012-01-20 MED ORDER — HYDROCODONE-ACETAMINOPHEN 10-325 MG PO TABS: 1.0000 | ORAL_TABLET | Freq: Three times a day (TID) | ORAL | Status: DC | PRN

## 2012-01-20 NOTE — Assessment & Plan Note (Addendum)
Augmentin, Flonase. RTC when necessary.

## 2012-01-20 NOTE — Assessment & Plan Note (Signed)
B12 inj today and q monthly. Cont Iron.

## 2012-01-20 NOTE — Telephone Encounter (Signed)
Pt already schedule for 1st injection on 02/20/12.

## 2012-01-20 NOTE — Telephone Encounter (Signed)
Message copied by Renea Ee on Mon Jan 20, 2012 12:13 PM ------      Message from: Monica Becton      Created: Fri Jan 17, 2012  6:02 PM       [facility],  I want to set up Mandi for monthly B12 shots.  q monthly.  Can you set this up?      Thanks!

## 2012-01-20 NOTE — Progress Notes (Signed)
Subjective:    CC: Right-sided face pain  HPI: Logyn comes back to see [place] with a several-day history of upper respiratory symptoms, as well as pain and pressure that she localizes to her right ear, as well as right cheek. She also has yellowish nasal discharge. She denies fevers, chills, but does note some pain in the left side of her throat. She does get sinusitis often, and this feels similar.  She was treated over a month ago with azithromycin she feels incompletely resolved her symptoms.  Past medical history, Surgical history, Family history, Social history, Allergies, and medications have been entered into the medical record, reviewed, and no changes needed.   Review of Systems: No fevers, chills, night sweats, weight loss, chest pain, or shortness of breath.   Objective:    General: Well Developed, well nourished, and in no acute distress.  Neuro: Alert and oriented x3, extra-ocular muscles intact.  HEENT: Normocephalic, atraumatic, pupils equal round reactive to light, neck supple, no masses, no lymphadenopathy, thyroid nonpalpable. Tender over right maxillary sinus. Also tender in right neck. Skin: Warm and dry, no rashes. Cardiac: Regular rate and rhythm, no murmurs rubs or gallops.  Respiratory: Clear to auscultation bilaterally. Not using accessory muscles, speaking in full sentences.   Impression and Recommendations:

## 2012-01-21 ENCOUNTER — Encounter (HOSPITAL_COMMUNITY): Payer: Self-pay | Admitting: Pharmacy Technician

## 2012-01-22 ENCOUNTER — Ambulatory Visit (AMBULATORY_SURGERY_CENTER): Payer: 59 | Admitting: Gastroenterology

## 2012-01-22 ENCOUNTER — Encounter: Payer: Self-pay | Admitting: Gastroenterology

## 2012-01-22 VITALS — BP 135/77 | HR 73 | Temp 99.1°F | Resp 21 | Ht 63.0 in | Wt 267.0 lb

## 2012-01-22 DIAGNOSIS — K2970 Gastritis, unspecified, without bleeding: Secondary | ICD-10-CM

## 2012-01-22 DIAGNOSIS — R1011 Right upper quadrant pain: Secondary | ICD-10-CM

## 2012-01-22 DIAGNOSIS — D649 Anemia, unspecified: Secondary | ICD-10-CM

## 2012-01-22 DIAGNOSIS — D1330 Benign neoplasm of unspecified part of small intestine: Secondary | ICD-10-CM

## 2012-01-22 DIAGNOSIS — D509 Iron deficiency anemia, unspecified: Secondary | ICD-10-CM

## 2012-01-22 DIAGNOSIS — Z85038 Personal history of other malignant neoplasm of large intestine: Secondary | ICD-10-CM

## 2012-01-22 DIAGNOSIS — R110 Nausea: Secondary | ICD-10-CM

## 2012-01-22 DIAGNOSIS — K2990 Gastroduodenitis, unspecified, without bleeding: Secondary | ICD-10-CM

## 2012-01-22 DIAGNOSIS — K2960 Other gastritis without bleeding: Secondary | ICD-10-CM

## 2012-01-22 DIAGNOSIS — D490 Neoplasm of unspecified behavior of digestive system: Secondary | ICD-10-CM

## 2012-01-22 MED ORDER — FLEET ENEMA 7-19 GM/118ML RE ENEM: 1.0000 | ENEMA | Freq: Once | RECTAL | Status: DC

## 2012-01-22 MED ORDER — SODIUM CHLORIDE 0.9 % IV SOLN: 500.0000 mL | INTRAVENOUS | Status: DC

## 2012-01-22 MED ORDER — ONDANSETRON HCL 4 MG/2ML IJ SOLN: 8.0000 mg | Freq: Once | INTRAMUSCULAR | Status: DC

## 2012-01-22 NOTE — Progress Notes (Signed)
Pt states still passing cloudy brown stool called Dr Marina Goodell last night and was told to still come into office and pt could be given an enema. Pt states she still has not cleared up today, enema given.

## 2012-01-22 NOTE — Progress Notes (Signed)
1551Patient stating she is much improved, and ready for discharge. Patient up out of bed to bathroom prior to discharge.

## 2012-01-22 NOTE — Op Note (Signed)
[facility] [address] [place], [address]   ENDOSCOPY PROCEDURE REPORT  PATIENT: [name], [name]  MR#: [id number] BIRTHDATE: 26-Feb-1971 , 41  yrs. old GENDER: Female ENDOSCOPIST: Meryl Dare, MD, [facility] REFERRED BY:  Abigail Miyamoto, M.D. PROCEDURE DATE:  01/22/2012 PROCEDURE:  EGD w/ biopsy ASA CLASS:     Class II INDICATIONS:  abdominal pain in the upper right quadrant,  iron deficiency anemia. MEDICATIONS: MAC sedation, administered by CRNA, There was residual sedation effect present from prior procedure, and propofol (Diprivan) 250mg  IV TOPICAL ANESTHETIC: Cetacaine Spray DESCRIPTION OF PROCEDURE: After the risks benefits and alternatives of the procedure were thoroughly explained, informed consent was obtained.  The LB GIF-H180 [id number] endoscope was introduced through the mouth and advanced to the second portion of the duodenum. Without limitations.  The instrument was slowly withdrawn as the mucosa was fully examined.   STOMACH: Mild gastritis (inflammation) was found in the gastric antrum and gastric body. There was patchy erythema noted. Multiple biopsies were performed.   The stomach otherwise appeared normal.  DUODENUM: The duodenal mucosa showed no abnormalities in the bulb and second portion of the duodenum.  Cold forceps biopsies were taken in the bulb and second portion. ESOPHAGUS: The mucosa of the esophagus appeared normal.  Retroflexed views revealed no abnormalities.     The scope was then withdrawn from the patient and the procedure completed.  COMPLICATIONS: There were no complications.  ENDOSCOPIC IMPRESSION: 1.   Mild gastritis in the gastric antrum and gastric body; multiple biopsies  RECOMMENDATIONS: 1.   Await pathology results  Recall Date & Procedure]  eSigned:  Meryl Dare, MD, [facility] 01/22/2012 3:09 PM

## 2012-01-22 NOTE — Progress Notes (Signed)
Zofran 8mg  given IV over 7 minutes,1527-1534.

## 2012-01-22 NOTE — Patient Instructions (Signed)

## 2012-01-22 NOTE — Op Note (Signed)
[facility] [address] [place], [address]   COLONOSCOPY PROCEDURE REPORT  PATIENT: [name], [name]  MR#: [id number] BIRTHDATE: Oct 02, 1970 , 41  yrs. old GENDER: Female ENDOSCOPIST: Meryl Dare, MD, [facility] REFERRED AO:[id number] Magnus Ivan, M.D. 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  A digital rectal exam revealed no abnormalities of the rectum.   The LB CF-H180AL [id number]  endoscope was introduced through the anus and advanced to the cecum, which was identified by both the appendix and ileocecal valve. No adverse events experienced.   The quality of the prep was adequate, using MoviPrep, after extensive rinsing and suctioning.  The instrument was then slowly withdrawn as the colon was fully examined.   COLON FINDINGS: A normal appearing cecum, ileocecal valve, and appendiceal orifice were identified.  The ascending, hepatic flexure, transverse, splenic flexure, descending, sigmoid colon and rectum appeared unremarkable.  No polyps or cancers were seen. Retroflexed views revealed no abnormalities. The time to cecum=2 minutes 38 seconds.  Withdrawal time=15 minutes seconds.  The scope was withdrawn and the procedure completed.  COMPLICATIONS: There were no complications.  ENDOSCOPIC IMPRESSION: 1.  Normal colon  RECOMMENDATIONS: 1. Repeat Colonoscopy in 2 years with a more extensive bowel prep 2. Follow with your PCP for treatment of fe and B12 deficienceis   eSigned:  Meryl Dare, MD, [facility] 01/22/2012 2:59 PM

## 2012-01-22 NOTE — Progress Notes (Signed)
Patient did not experience any of the following events: a burn prior to discharge; a fall within the facility; wrong site/side/patient/procedure/implant event; or a hospital transfer or hospital admission upon discharge from the facility. (G8907) Patient did not have preoperative order for IV antibiotic SSI prophylaxis. (G8918)  

## 2012-01-23 ENCOUNTER — Telehealth: Payer: Self-pay | Admitting: *Deleted

## 2012-01-23 ENCOUNTER — Ambulatory Visit: Payer: Self-pay | Admitting: Sports Medicine

## 2012-01-23 ENCOUNTER — Encounter (HOSPITAL_COMMUNITY)
Admission: RE | Admit: 2012-01-23 | Discharge: 2012-01-23 | Disposition: A | Discharge status: Home / Self Care | Payer: 59 | Source: Ambulatory Visit | Attending: Surgery | Admitting: Surgery

## 2012-01-23 ENCOUNTER — Encounter (HOSPITAL_COMMUNITY): Payer: Self-pay

## 2012-01-23 LAB — SURGICAL PCR SCREEN
MRSA, PCR: NEGATIVE
Staphylococcus aureus: NEGATIVE

## 2012-01-23 LAB — CBC
HCT: 31.7 % — ABNORMAL LOW (ref 36.0–46.0)
Hemoglobin: 10.3 g/dL — ABNORMAL LOW (ref 12.0–15.0)
RDW: 14.5 % (ref 11.5–15.5)
WBC: 6.2 10*3/uL (ref 4.0–10.5)

## 2012-01-23 LAB — BASIC METABOLIC PANEL
Chloride: 102 mEq/L (ref 96–112)
GFR calc Af Amer: >90 mL/min (ref >90)
GFR calc non Af Amer: >90 mL/min (ref >90)
Potassium: 3.9 mEq/L (ref 3.5–5.1)

## 2012-01-23 MED ORDER — CHLORHEXIDINE GLUCONATE 4 % EX LIQD: 1.0000 "application " | Freq: Once | CUTANEOUS | Status: DC

## 2012-01-23 NOTE — Telephone Encounter (Signed)
  Follow up Call-  Call back number 01/22/2012  Post procedure Call Back phone  # [phone]  Permission to leave phone message Yes     Patient questions:  Do you have a fever, pain , or abdominal swelling? no Pain Score  0 *  Have you tolerated food without any problems? yes  Have you been able to return to your normal activities? yes  Do you have any questions about your discharge instructions: Diet   no Medications  no Follow up visit  no  Do you have questions or concerns about your Care? no  Actions: * If pain score is 4 or above: No action needed, pain <4.pt did say her lip on inside bottom is sore from the bite block she assumes but says her mouth is small & this always happens when she has anything surgical done .Instructed pt to call us back if this doesn't improve each day.

## 2012-01-23 NOTE — Pre-Procedure Instructions (Signed)
20 [name]  01/23/2012   Your procedure is scheduled on:  Monday January 27, 2012 at 1300 PM  Report to [facility] at 1100 AM.  Call this number if you have problems the morning of surgery: [phone]   Remember:   Do not eat food or drink:After Midnight.Sunday     Take these medicines the morning of surgery with A SIP OF WATER: Clonazepam [Klonopin ], Nexium, Hydrocodone- Acetaminophen ] if needed, Synthroid, Zoloft [sertraline ]and Phenergan if needed. May use Flonase nasal spray and Advair inhaler . Bring with you morning of surgery   Do not wear jewelry, make-up or nail polish.  Do not wear lotions, powders, or perfumes. You may wear deodorant.  Do not shave 48 hours prior to surgery.   Do not bring valuables to the hospital.  Contacts, dentures or bridgework may not be worn into surgery.  Leave suitcase in the car. After surgery it may be brought to your room.  For patients admitted to the hospital, checkout time is 11:00 AM the day of discharge.   Patients discharged the day of surgery will not be allowed to drive home.    Special Instructions: Shower using CHG 2 nights before surgery and the night before surgery.  If you shower the day of surgery use CHG.  Use special wash - you have one bottle of CHG for all showers.  You should use approximately 1/3 of the bottle for each shower.   Please read over the following fact sheets that you were given: Pain Booklet, Coughing and Deep Breathing, MRSA Information and Surgical Site Infection Prevention

## 2012-01-26 MED ORDER — SODIUM CHLORIDE 0.9 % IV SOLN
1.0000 g | INTRAVENOUS | Status: AC
  Administered 2012-01-27: 1 g via INTRAVENOUS
  Filled 2012-01-26 (×2): qty 1

## 2012-01-27 ENCOUNTER — Ambulatory Visit (HOSPITAL_COMMUNITY): Payer: 59 | Admitting: Anesthesiology

## 2012-01-27 ENCOUNTER — Encounter: Payer: Self-pay | Admitting: Gastroenterology

## 2012-01-27 ENCOUNTER — Encounter (HOSPITAL_COMMUNITY): Payer: Self-pay | Admitting: *Deleted

## 2012-01-27 ENCOUNTER — Encounter (HOSPITAL_COMMUNITY)
Admission: RE | Disposition: A | Discharge status: Home / Self Care | Payer: Self-pay | Source: Ambulatory Visit | Attending: Surgery

## 2012-01-27 ENCOUNTER — Inpatient Hospital Stay (HOSPITAL_COMMUNITY)
Admission: RE | Admit: 2012-01-27 | Discharge: 2012-02-02 | DRG: 330 | Disposition: A | Discharge status: Home / Self Care | Payer: 59 | Source: Ambulatory Visit | Attending: Surgery | Admitting: Surgery

## 2012-01-27 ENCOUNTER — Encounter (HOSPITAL_COMMUNITY): Payer: Self-pay | Admitting: Anesthesiology

## 2012-01-27 DIAGNOSIS — K811 Chronic cholecystitis: Secondary | ICD-10-CM | POA: Diagnosis present

## 2012-01-27 DIAGNOSIS — E669 Obesity, unspecified: Secondary | ICD-10-CM | POA: Diagnosis present

## 2012-01-27 DIAGNOSIS — K219 Gastro-esophageal reflux disease without esophagitis: Secondary | ICD-10-CM | POA: Diagnosis present

## 2012-01-27 DIAGNOSIS — Z6841 Body Mass Index (BMI) 40.0 and over, adult: Secondary | ICD-10-CM

## 2012-01-27 DIAGNOSIS — Z23 Encounter for immunization: Secondary | ICD-10-CM

## 2012-01-27 DIAGNOSIS — D373 Neoplasm of uncertain behavior of appendix: Secondary | ICD-10-CM

## 2012-01-27 DIAGNOSIS — F411 Generalized anxiety disorder: Secondary | ICD-10-CM | POA: Diagnosis present

## 2012-01-27 DIAGNOSIS — D126 Benign neoplasm of colon, unspecified: Principal | ICD-10-CM | POA: Diagnosis present

## 2012-01-27 DIAGNOSIS — F3289 Other specified depressive episodes: Secondary | ICD-10-CM | POA: Diagnosis present

## 2012-01-27 DIAGNOSIS — J45909 Unspecified asthma, uncomplicated: Secondary | ICD-10-CM | POA: Diagnosis present

## 2012-01-27 DIAGNOSIS — E039 Hypothyroidism, unspecified: Secondary | ICD-10-CM | POA: Diagnosis present

## 2012-01-27 DIAGNOSIS — F4001 Agoraphobia with panic disorder: Secondary | ICD-10-CM

## 2012-01-27 DIAGNOSIS — F329 Major depressive disorder, single episode, unspecified: Secondary | ICD-10-CM | POA: Diagnosis present

## 2012-01-27 DIAGNOSIS — Z79899 Other long term (current) drug therapy: Secondary | ICD-10-CM

## 2012-01-27 HISTORY — PX: CHOLECYSTECTOMY: SHX55

## 2012-01-27 HISTORY — PX: PARTIAL COLECTOMY: SHX5273

## 2012-01-27 SURGERY — LAPAROSCOPIC PARTIAL COLECTOMY
Anesthesia: General | Site: Abdomen | Wound class: Clean Contaminated

## 2012-01-27 MED ORDER — SCOPOLAMINE 1 MG/3DAYS TD PT72
MEDICATED_PATCH | TRANSDERMAL | Status: AC
  Filled 2012-01-27: qty 1

## 2012-01-27 MED ORDER — GLYCOPYRROLATE 0.2 MG/ML IJ SOLN
INTRAMUSCULAR | Status: DC | PRN
  Administered 2012-01-27: .5 mg via INTRAVENOUS

## 2012-01-27 MED ORDER — OXYCODONE HCL 5 MG PO TABS
5.0000 mg | ORAL_TABLET | Freq: Once | ORAL | Status: DC | PRN
Start: 2012-01-27 — End: 2012-01-27

## 2012-01-27 MED ORDER — BUPIVACAINE-EPINEPHRINE PF 0.25-1:200000 % IJ SOLN
INTRAMUSCULAR | Status: AC
  Filled 2012-01-27: qty 30

## 2012-01-27 MED ORDER — PROMETHAZINE HCL 25 MG/ML IJ SOLN
12.5000 mg | Freq: Four times a day (QID) | INTRAMUSCULAR | Status: DC | PRN
  Administered 2012-01-30 – 2012-02-02 (×4): 12.5 mg via INTRAVENOUS
  Filled 2012-01-27 (×4): qty 1

## 2012-01-27 MED ORDER — HYDROMORPHONE HCL PF 1 MG/ML IJ SOLN
INTRAMUSCULAR | Status: AC
  Administered 2012-01-27: 0.5 mg via INTRAVENOUS
  Filled 2012-01-27: qty 1

## 2012-01-27 MED ORDER — POTASSIUM CHLORIDE IN NACL 20-0.9 MEQ/L-% IV SOLN
INTRAVENOUS | Status: DC
  Administered 2012-01-27 – 2012-01-28 (×2): via INTRAVENOUS
  Administered 2012-01-28: 125 mL/h via INTRAVENOUS
  Administered 2012-01-28 – 2012-02-02 (×11): via INTRAVENOUS
  Filled 2012-01-27 (×18): qty 1000

## 2012-01-27 MED ORDER — MIDAZOLAM HCL 5 MG/5ML IJ SOLN
INTRAMUSCULAR | Status: DC | PRN
  Administered 2012-01-27: 2 mg via INTRAVENOUS

## 2012-01-27 MED ORDER — BUPIVACAINE-EPINEPHRINE 0.25% -1:200000 IJ SOLN
INTRAMUSCULAR | Status: DC | PRN
  Administered 2012-01-27: 30 mL

## 2012-01-27 MED ORDER — HYDROMORPHONE HCL PF 1 MG/ML IJ SOLN
INTRAMUSCULAR | Status: AC
  Filled 2012-01-27: qty 1

## 2012-01-27 MED ORDER — ONDANSETRON HCL 4 MG/2ML IJ SOLN
INTRAMUSCULAR | Status: AC
  Administered 2012-01-27: 4 mg via INTRAVENOUS
  Filled 2012-01-27: qty 2

## 2012-01-27 MED ORDER — SODIUM CHLORIDE 0.9 % IR SOLN
Status: DC | PRN
  Administered 2012-01-27 (×4): 1000 mL

## 2012-01-27 MED ORDER — KETOROLAC TROMETHAMINE 30 MG/ML IJ SOLN
30.0000 mg | Freq: Once | INTRAMUSCULAR | Status: AC
  Administered 2012-01-27: 30 mg via INTRAVENOUS
  Filled 2012-01-27: qty 1

## 2012-01-27 MED ORDER — NALOXONE HCL 0.4 MG/ML IJ SOLN: 0.4000 mg | INTRAMUSCULAR | Status: DC | PRN

## 2012-01-27 MED ORDER — LACTATED RINGERS IV SOLN
INTRAVENOUS | Status: DC
  Administered 2012-01-27 (×2): via INTRAVENOUS

## 2012-01-27 MED ORDER — ONDANSETRON HCL 4 MG/2ML IJ SOLN
INTRAMUSCULAR | Status: DC | PRN
  Administered 2012-01-27: 4 mg via INTRAVENOUS

## 2012-01-27 MED ORDER — SODIUM CHLORIDE 0.9 % IV SOLN
1.0000 g | INTRAVENOUS | Status: AC
  Administered 2012-01-28: 1 g via INTRAVENOUS
  Filled 2012-01-27: qty 1

## 2012-01-27 MED ORDER — HYDROMORPHONE 0.3 MG/ML IV SOLN
INTRAVENOUS | Status: DC
  Administered 2012-01-27: 5.3 mg via INTRAVENOUS
  Administered 2012-01-27 (×2): via INTRAVENOUS
  Administered 2012-01-28: 3 mg via INTRAVENOUS
  Administered 2012-01-28: 3.73 mg via INTRAVENOUS
  Administered 2012-01-28: 2.99 mg via INTRAVENOUS
  Administered 2012-01-28: 1.94 mg via INTRAVENOUS
  Administered 2012-01-28: 3 mg via INTRAVENOUS
  Administered 2012-01-28: 23:00:00 via INTRAVENOUS
  Administered 2012-01-28: 1.5 mg via INTRAVENOUS
  Administered 2012-01-28: 1.07 mg via INTRAVENOUS
  Administered 2012-01-28 – 2012-01-29 (×3): via INTRAVENOUS
  Administered 2012-01-29: 3.6 mg via INTRAVENOUS
  Administered 2012-01-29: 3.3 mg via INTRAVENOUS
  Administered 2012-01-29: 2.1 mg via INTRAVENOUS
  Administered 2012-01-29: 1.8 mg via INTRAVENOUS
  Administered 2012-01-29: 3.6 mg via INTRAVENOUS
  Administered 2012-01-30: 1.5 mg via INTRAVENOUS
  Administered 2012-01-30: 3.11 mg via INTRAVENOUS
  Filled 2012-01-27 (×5): qty 25

## 2012-01-27 MED ORDER — HYDROMORPHONE 0.3 MG/ML IV SOLN
INTRAVENOUS | Status: AC
  Filled 2012-01-27: qty 25

## 2012-01-27 MED ORDER — ONDANSETRON HCL 4 MG PO TABS: 4.0000 mg | ORAL_TABLET | Freq: Four times a day (QID) | ORAL | Status: DC | PRN

## 2012-01-27 MED ORDER — ALVIMOPAN 12 MG PO CAPS
12.0000 mg | ORAL_CAPSULE | Freq: Two times a day (BID) | ORAL | Status: DC
  Administered 2012-01-28 – 2012-02-01 (×9): 12 mg via ORAL
  Filled 2012-01-27 (×10): qty 1

## 2012-01-27 MED ORDER — ONDANSETRON HCL 4 MG/2ML IJ SOLN
4.0000 mg | Freq: Once | INTRAMUSCULAR | Status: AC
  Administered 2012-01-27: 4 mg via INTRAVENOUS

## 2012-01-27 MED ORDER — ONDANSETRON HCL 4 MG/2ML IJ SOLN: 4.0000 mg | Freq: Four times a day (QID) | INTRAMUSCULAR | Status: DC | PRN

## 2012-01-27 MED ORDER — DEXAMETHASONE SODIUM PHOSPHATE 4 MG/ML IJ SOLN
INTRAMUSCULAR | Status: DC | PRN
  Administered 2012-01-27: 4 mg via INTRAVENOUS

## 2012-01-27 MED ORDER — LEVOTHYROXINE SODIUM 150 MCG PO TABS
150.0000 ug | ORAL_TABLET | Freq: Every day | ORAL | Status: DC
  Administered 2012-01-27 – 2012-01-28 (×2): 150 ug via ORAL
  Filled 2012-01-27 (×2): qty 1

## 2012-01-27 MED ORDER — ENOXAPARIN SODIUM 40 MG/0.4ML ~~LOC~~ SOLN
40.0000 mg | SUBCUTANEOUS | Status: DC
  Administered 2012-01-28 – 2012-02-02 (×6): 40 mg via SUBCUTANEOUS
  Filled 2012-01-27 (×6): qty 0.4

## 2012-01-27 MED ORDER — OXYCODONE HCL 5 MG/5ML PO SOLN: 5.0000 mg | Freq: Once | ORAL | Status: DC | PRN

## 2012-01-27 MED ORDER — PROPOFOL 10 MG/ML IV BOLUS
INTRAVENOUS | Status: DC | PRN
  Administered 2012-01-27: 200 mg via INTRAVENOUS

## 2012-01-27 MED ORDER — CLONAZEPAM 1 MG PO TABS
1.0000 mg | ORAL_TABLET | Freq: Every day | ORAL | Status: DC
  Administered 2012-01-27 – 2012-02-01 (×6): 1 mg via ORAL
  Filled 2012-01-27 (×6): qty 1

## 2012-01-27 MED ORDER — KETOROLAC TROMETHAMINE 15 MG/ML IJ SOLN
15.0000 mg | Freq: Four times a day (QID) | INTRAMUSCULAR | Status: AC | PRN
  Administered 2012-01-27 – 2012-01-29 (×6): 15 mg via INTRAVENOUS
  Filled 2012-01-27 (×6): qty 1

## 2012-01-27 MED ORDER — FLUTICASONE-SALMETEROL 250-50 MCG/DOSE IN AEPB
1.0000 | INHALATION_SPRAY | Freq: Every day | RESPIRATORY_TRACT | Status: DC
  Administered 2012-01-27 – 2012-02-02 (×7): 1 via RESPIRATORY_TRACT
  Filled 2012-01-27: qty 14

## 2012-01-27 MED ORDER — SERTRALINE HCL 100 MG PO TABS
200.0000 mg | ORAL_TABLET | Freq: Every day | ORAL | Status: DC
  Administered 2012-01-27: 200 mg via ORAL
  Filled 2012-01-27 (×2): qty 2

## 2012-01-27 MED ORDER — DROPERIDOL 2.5 MG/ML IJ SOLN: 0.6250 mg | INTRAMUSCULAR | Status: DC | PRN

## 2012-01-27 MED ORDER — DIPHENHYDRAMINE HCL 50 MG/ML IJ SOLN
12.5000 mg | Freq: Four times a day (QID) | INTRAMUSCULAR | Status: DC | PRN
  Administered 2012-01-27 – 2012-01-29 (×5): 12.5 mg via INTRAVENOUS
  Filled 2012-01-27 (×5): qty 1

## 2012-01-27 MED ORDER — SODIUM CHLORIDE 0.9 % IR SOLN
Status: DC | PRN
  Administered 2012-01-27: 1000 mL

## 2012-01-27 MED ORDER — LABETALOL HCL 5 MG/ML IV SOLN
INTRAVENOUS | Status: DC | PRN
  Administered 2012-01-27: 10 mg via INTRAVENOUS

## 2012-01-27 MED ORDER — ROCURONIUM BROMIDE 100 MG/10ML IV SOLN
INTRAVENOUS | Status: DC | PRN
  Administered 2012-01-27: 50 mg via INTRAVENOUS

## 2012-01-27 MED ORDER — SCOPOLAMINE 1 MG/3DAYS TD PT72
1.0000 | MEDICATED_PATCH | TRANSDERMAL | Status: DC
  Administered 2012-01-27: 1 via TRANSDERMAL
  Filled 2012-01-27: qty 1

## 2012-01-27 MED ORDER — ALVIMOPAN 12 MG PO CAPS
12.0000 mg | ORAL_CAPSULE | Freq: Once | ORAL | Status: AC
  Administered 2012-01-27: 12 mg via ORAL
  Filled 2012-01-27: qty 1

## 2012-01-27 MED ORDER — SODIUM CHLORIDE 0.9 % IJ SOLN: 9.0000 mL | INTRAMUSCULAR | Status: DC | PRN

## 2012-01-27 MED ORDER — VECURONIUM BROMIDE 10 MG IV SOLR
INTRAVENOUS | Status: DC | PRN
  Administered 2012-01-27: 2 mg via INTRAVENOUS

## 2012-01-27 MED ORDER — DIPHENHYDRAMINE HCL 12.5 MG/5ML PO ELIX
12.5000 mg | ORAL_SOLUTION | Freq: Four times a day (QID) | ORAL | Status: DC | PRN
  Filled 2012-01-27: qty 5

## 2012-01-27 MED ORDER — LIDOCAINE HCL (CARDIAC) 20 MG/ML IV SOLN
INTRAVENOUS | Status: DC | PRN
  Administered 2012-01-27: 100 mg via INTRAVENOUS

## 2012-01-27 MED ORDER — FENTANYL CITRATE 0.05 MG/ML IJ SOLN
INTRAMUSCULAR | Status: DC | PRN
  Administered 2012-01-27: 100 ug via INTRAVENOUS
  Administered 2012-01-27: 50 ug via INTRAVENOUS
  Administered 2012-01-27: 100 ug via INTRAVENOUS
  Administered 2012-01-27: 150 ug via INTRAVENOUS

## 2012-01-27 MED ORDER — NEOSTIGMINE METHYLSULFATE 1 MG/ML IJ SOLN
INTRAMUSCULAR | Status: DC | PRN
  Administered 2012-01-27: 4 mg via INTRAVENOUS

## 2012-01-27 MED ORDER — HYDROMORPHONE HCL PF 1 MG/ML IJ SOLN
0.2500 mg | INTRAMUSCULAR | Status: DC | PRN
  Administered 2012-01-27 (×6): 0.5 mg via INTRAVENOUS

## 2012-01-27 MED ORDER — KETOROLAC TROMETHAMINE 15 MG/ML IJ SOLN: 15.0000 mg | Freq: Four times a day (QID) | INTRAMUSCULAR | Status: DC

## 2012-01-27 MED ORDER — ONDANSETRON HCL 4 MG/2ML IJ SOLN
4.0000 mg | Freq: Four times a day (QID) | INTRAMUSCULAR | Status: DC | PRN
  Administered 2012-01-28 – 2012-01-29 (×2): 4 mg via INTRAVENOUS
  Filled 2012-01-27 (×2): qty 2

## 2012-01-27 SURGICAL SUPPLY — 68 items
APPLIER CLIP 5 13 M/L LIGAMAX5 (MISCELLANEOUS) ×2
APPLIER CLIP ROT 10 11.4 M/L (STAPLE) ×2
BANDAGE ADHESIVE 1X3 (GAUZE/BANDAGES/DRESSINGS) IMPLANT
BENZOIN TINCTURE PRP APPL 2/3 (GAUZE/BANDAGES/DRESSINGS) IMPLANT
BLADE SURG 10 STRL SS (BLADE) ×2 IMPLANT
CANISTER SUCTION 2500CC (MISCELLANEOUS) ×2 IMPLANT
CELLS DAT CNTRL 66122 CELL SVR (MISCELLANEOUS) ×1 IMPLANT
CHLORAPREP W/TINT 26ML (MISCELLANEOUS) ×2 IMPLANT
CLIP APPLIE 5 13 M/L LIGAMAX5 (MISCELLANEOUS) ×1 IMPLANT
CLIP APPLIE ROT 10 11.4 M/L (STAPLE) ×1 IMPLANT
CLOTH BEACON ORANGE TIMEOUT ST (SAFETY) ×2 IMPLANT
COVER SURGICAL LIGHT HANDLE (MISCELLANEOUS) ×2 IMPLANT
DECANTER SPIKE VIAL GLASS SM (MISCELLANEOUS) ×2 IMPLANT
DRAPE WARM FLUID 44X44 (DRAPE) ×2 IMPLANT
ELECT CAUTERY BLADE 6.4 (BLADE) ×4 IMPLANT
ELECT REM PT RETURN 9FT ADLT (ELECTROSURGICAL) ×2
ELECTRODE REM PT RTRN 9FT ADLT (ELECTROSURGICAL) ×1 IMPLANT
GLOVE BIO SURGEON STRL SZ 6.5 (GLOVE) ×2 IMPLANT
GLOVE BIO SURGEON STRL SZ7 (GLOVE) ×2 IMPLANT
GLOVE BIO SURGEON STRL SZ7.5 (GLOVE) ×2 IMPLANT
GLOVE BIOGEL PI IND STRL 7.5 (GLOVE) ×3 IMPLANT
GLOVE BIOGEL PI INDICATOR 7.5 (GLOVE) ×3
GLOVE ECLIPSE 7.5 STRL STRAW (GLOVE) ×4 IMPLANT
GLOVE SURG SIGNA 7.5 PF LTX (GLOVE) ×4 IMPLANT
GLOVE SURG SS PI 6.5 STRL IVOR (GLOVE) ×6 IMPLANT
GOWN PREVENTION PLUS XLARGE (GOWN DISPOSABLE) ×2 IMPLANT
GOWN STRL NON-REIN LRG LVL3 (GOWN DISPOSABLE) ×10 IMPLANT
KIT BASIN OR (CUSTOM PROCEDURE TRAY) ×2 IMPLANT
KIT ROOM TURNOVER OR (KITS) ×2 IMPLANT
LIGASURE IMPACT 36 18CM CVD LR (INSTRUMENTS) ×2 IMPLANT
NS IRRIG 1000ML POUR BTL (IV SOLUTION) ×4 IMPLANT
PAD ARMBOARD 7.5X6 YLW CONV (MISCELLANEOUS) ×4 IMPLANT
PENCIL BUTTON HOLSTER BLD 10FT (ELECTRODE) ×2 IMPLANT
RELOAD PROXIMATE 75MM BLUE (ENDOMECHANICALS) ×4 IMPLANT
RTRCTR WOUND ALEXIS 18CM MED (MISCELLANEOUS) ×2
SCALPEL HARMONIC ACE (MISCELLANEOUS) ×2 IMPLANT
SCISSORS LAP 5X35 DISP (ENDOMECHANICALS) IMPLANT
SET IRRIG TUBING LAPAROSCOPIC (IRRIGATION / IRRIGATOR) ×2 IMPLANT
SLEEVE ENDOPATH XCEL 5M (ENDOMECHANICALS) ×4 IMPLANT
SPECIMEN JAR LARGE (MISCELLANEOUS) ×2 IMPLANT
SPECIMEN JAR SMALL (MISCELLANEOUS) ×2 IMPLANT
SPONGE GAUZE 4X4 12PLY (GAUZE/BANDAGES/DRESSINGS) ×2 IMPLANT
SPONGE LAP 18X18 X RAY DECT (DISPOSABLE) ×4 IMPLANT
STAPLER GUN LINEAR PROX 60 (STAPLE) ×2 IMPLANT
STAPLER PROXIMATE 75MM BLUE (STAPLE) ×2 IMPLANT
STAPLER VISISTAT 35W (STAPLE) ×2 IMPLANT
SUCTION POOLE TIP (SUCTIONS) ×2 IMPLANT
SUT MON AB 4-0 PC3 18 (SUTURE) ×2 IMPLANT
SUT PDS AB 1 CT  36 (SUTURE) ×2
SUT PDS AB 1 CT 36 (SUTURE) ×2 IMPLANT
SUT PDS AB 1 TP1 96 (SUTURE) ×4 IMPLANT
SUT SILK 2 0 SH CR/8 (SUTURE) ×4 IMPLANT
SUT SILK 2 0 TIES 10X30 (SUTURE) ×2 IMPLANT
SUT SILK 3 0 SH CR/8 (SUTURE) ×4 IMPLANT
SUT SILK 3 0 TIES 10X30 (SUTURE) ×2 IMPLANT
SYS LAPSCP GELPORT 120MM (MISCELLANEOUS)
SYSTEM LAPSCP GELPORT 120MM (MISCELLANEOUS) IMPLANT
TAPE CLOTH SOFT 2X10 (GAUZE/BANDAGES/DRESSINGS) ×2 IMPLANT
TOWEL OR 17X24 6PK STRL BLUE (TOWEL DISPOSABLE) ×2 IMPLANT
TOWEL OR 17X26 10 PK STRL BLUE (TOWEL DISPOSABLE) ×2 IMPLANT
TRAY FOLEY CATH 14FRSI W/METER (CATHETERS) ×2 IMPLANT
TRAY LAPAROSCOPIC (CUSTOM PROCEDURE TRAY) ×2 IMPLANT
TROCAR XCEL BLUNT TIP 100MML (ENDOMECHANICALS) ×2 IMPLANT
TROCAR XCEL NON-BLD 5MMX100MML (ENDOMECHANICALS) ×2 IMPLANT
TUBE CONNECTING 12X1/4 (SUCTIONS) ×2 IMPLANT
TUBING FILTER THERMOFLATOR (ELECTROSURGICAL) ×2 IMPLANT
WATER STERILE IRR 1000ML POUR (IV SOLUTION) IMPLANT
YANKAUER SUCT BULB TIP NO VENT (SUCTIONS) ×4 IMPLANT

## 2012-01-27 NOTE — Op Note (Signed)
NAMESTEPHANIEANN, POPESCU NO.:  [id number]  MEDICAL RECORD NO.:  [id number]  LOCATION:  6N02C                        FACILITY:  [facility]  PHYSICIAN:  Abigail Miyamoto, M.D. DATE OF BIRTH:  04/27/1970  DATE OF PROCEDURE:  01/27/2012 DATE OF DISCHARGE:                              OPERATIVE REPORT   PREOPERATIVE DIAGNOSES: 1. Low-grade mucinous neoplasm of the appendix. 2. Chronic cholecystitis.  POSTOPERATIVE DIAGNOSES: 1. Low-grade mucinous neoplasm of the appendix. 2. Chronic cholecystitis.  PROCEDURES: 1. Laparoscopic cholecystectomy. 2. Laparoscopic-assisted partial right colectomy.  SURGEONS:  Abigail Miyamoto, MD  ASSISTANTS:  Wilmon Arms. Tsuei, MD  ANESTHESIA:  General endotracheal anesthesia.  ESTIMATED BLOOD LOSS:  Minimal.  INDICATIONS:  This is a 41 year old female who has had chronic abdominal complaints including nausea, vomiting, and diarrhea.  She recently underwent a laparoscopic appendectomy, was found to have a low-grade mucinous neoplasm of the appendix.  Given these findings of the appendix as well as her continued abdominal complaints, she was referred to Gastroenterology and had an upper and lower endoscopy which unremarkable.  We then discussed this further with multiple surgeons and decision was made to proceed with a partial colectomy and cholecystectomy.  PROCEDURE IN DETAIL:  The patient was brought to the operating room, identified as Roland Rack.  She was placed supine on the operative room table and general anesthesia was induced.  Her abdomen was then prepped and draped in the usual sterile fashion.  Using #15 blade, a small vertical incision was made above the umbilicus.  This was carried down to the fascia which was opened with a scalpel.  A hemostat was then used to pass into the peritoneal cavity under direct vision.  A 0 Vicryl pursestring suture was placed around the fascial opening.  The Hasson port was placed  through the opening and insufflation of the abdomen was begun.  A 5-mm port was then placed in the epigastrium and two more in the right upper quadrant all under direct vision.  The gallbladder was then grasped and retracted above the liver bed.  It was found to be thick-walled in appearance.  The cystic artery and cystic duct were easily dissected out.  I clipped the artery twice proximally, once distally, and transected it.  I then better identified the cystic duct and achieved a critical window around it.  I then clipped it 3 times proximally, once distally, and transected as well.  The gallbladder was then slowly dissected free from liver bed with the electrocautery.  Once this was freed from bed, I examined the liver bed and hemostasis appeared to be achieved with cautery.  The gallbladder was then removed through the incision at the umbilicus.  Next, I mobilized the right colon along the white line of Toldt with the Harmonic Scalpel.  Once appeared to mobilize enough colon, all ports removed and the umbilical incision was made larger.  I then used a wound protector.  I was able to pull the cecum above the incision.  I transected the right colon distal to the cecum with a GIA 75 stapler.  I then took down the mesentery with the LigaSure.  I then identified the terminal ileum.  I was able to elevate  this side of the wound.  There was an adhesive band that tethered the terminal ileum making it somewhat difficult to mobilize, but I was able to finally elevate it up enough and transected with the GIA 75 stapler.  Several bleeding areas in the mesentery had to be controlled with silk sutures.  I then approximated the small bowel to the remaining colon in a side-to-side fashion with interrupted silk sutures.  I performed an enterotomy and colotomy with the electrocautery and then performed a side-to-side anastomosis with a single firing of the GIA 75 stapler.  I then closed the opening with  a TA 60 stapler.  I inverted the staple line with interrupted silk sutures and closed the mesenteric defect with silk sutures as well.  Anastomosis which appeared well perfused appeared to be achieved.  I then thoroughly irrigated the abdomen.  Then with 3 L of normal saline.  Hemostasis appeared to be achieved.  I then closed the patient's midline fascia with a running number 1 PDS suture.  The skin was then irrigated further and anesthetized with Marcaine and then closed with staples.  All incisions were anesthetized as well as Marcaine and closed with staples as well. The patient tolerated the procedure well.  All the counts were correct at the end of the procedure.  The patient was then extubated in the operating room and taken in stable condition to the recovery room.     Abigail Miyamoto, M.D.     DB/MEDQ  D:  01/27/2012  T:  01/27/2012  Job:  [id number]

## 2012-01-27 NOTE — Anesthesia Postprocedure Evaluation (Signed)
Anesthesia Post Note  Patient: [name]  Procedure(s) Performed: Procedure(s) (LRB): LAPAROSCOPIC PARTIAL COLECTOMY (N/A) LAPAROSCOPIC CHOLECYSTECTOMY (N/A)  Anesthesia type: general  Patient location: PACU  Post pain: Pain level controlled  Post assessment: Patient's Cardiovascular Status Stable  Last Vitals:  Filed Vitals:   01/27/12 1545  BP: 147/85  Pulse: 77  Temp:   Resp: 13    Post vital signs: Reviewed and stable  Level of consciousness: sedated  Complications: No apparent anesthesia complications

## 2012-01-27 NOTE — Anesthesia Preprocedure Evaluation (Addendum)
Anesthesia Evaluation  Patient identified by MRN, date of birth, ID band Patient awake    Reviewed: Allergy & Precautions  Airway Mallampati: II TM Distance: >3 FB Neck ROM: Full    Dental  (+) Teeth Intact and Dental Advisory Given   Pulmonary asthma ,  breath sounds clear to auscultation  Pulmonary exam normal       Cardiovascular Rhythm:Regular Rate:Normal     Neuro/Psych  Headaches, PSYCHIATRIC DISORDERS Anxiety Depression    GI/Hepatic Neg liver ROS, GERD-  Medicated,  Endo/Other  Hypothyroidism Morbid obesity  Renal/GU negative Renal ROS     Musculoskeletal negative musculoskeletal ROS (+)   Abdominal   Peds  Hematology  (+) Blood dyscrasia, anemia ,   Anesthesia Other Findings   Reproductive/Obstetrics                          Anesthesia Physical Anesthesia Plan  ASA: II  Anesthesia Plan: General   Post-op Pain Management:    Induction: Intravenous  Airway Management Planned: Oral ETT  Additional Equipment:   Intra-op Plan:   Post-operative Plan: Extubation in OR  Informed Consent: I have reviewed the patients History and Physical, chart, labs and discussed the procedure including the risks, benefits and alternatives for the proposed anesthesia with the patient or authorized representative who has indicated his/her understanding and acceptance.   Dental advisory given  Plan Discussed with: CRNA, Anesthesiologist and Surgeon  Anesthesia Plan Comments:         Anesthesia Quick Evaluation

## 2012-01-27 NOTE — H&P (Signed)
[name] is an 41 y.o. female.   Chief Complaint: low grade mucinous neoplasm of the appendix  HPI: this is young female has been having chronic abdominal pain, nausea, and vomiting for at least 8 months. She has undergone laparoscopy for endometriosis, a laparoscopic-assisted vaginal hysterectomy, and most recently a laparoscopic appendectomy. These are failed to improve her symptoms. During the appendectomy, she was found on pathology to have a low-grade mucinous neoplasm of the appendix. She has now had a preoperative upper and lower endoscopy was normal. She now presents for a laparoscopic assisted partial colectomy and laparoscopic cholecystectomy.  Past Medical History  Diagnosis Date  . Asthma   . Depression   . Anxiety   . GERD (gastroesophageal reflux disease)   . Hypothyroidism   . Endometriosis   . History of sinus surgery 2010    MAXILLARY, ETHMOID, SPHENOID  . Obesity, Class III, BMI 40-49.9 (morbid obesity)   . SVD (spontaneous vaginal delivery)     x3  . Generalized headaches   . Adenomyosis 2013  . Kidney stones   . Complication of anesthesia     scoline pain? from use of Succinylcholine   . Anemia     Past Surgical History  Procedure Date  . Knee surgery     right, scope and ACL repair  . Nasal sinus surgery 2010  . Colposcopy 2007  . Laparoscopy 05/30/2011    Procedure: LAPAROSCOPY OPERATIVE;  Surgeon: Leslie Mire, MD;  Location: [facility];  Service: Gynecology;  Laterality: N/A;  YAG  LASER of Endometriosis.  . Laparoscopic assisted vaginal hysterectomy 11/13/2011    Procedure: LAPAROSCOPIC ASSISTED VAGINAL HYSTERECTOMY;  Surgeon: Leslie Mire, MD;  Location: [facility];  Service: Gynecology;  Laterality: N/A;  . Abdominal hysterectomy   . Laparoscopic appendectomy 01/02/2012    Procedure: APPENDECTOMY LAPAROSCOPIC;  Surgeon: Leslie Rubenstein, MD;  Location: [facility];  Service: General;  Laterality: N/A;  . Appendectomy     Family History  Problem  Relation Age of Onset  . Lung cancer Father   . Alcohol abuse Father   . Colon cancer Paternal Grandmother   . Depression Mother   . Depression Sister   . Suicidality Brother   . Depression Brother   . Alcohol abuse Brother   . Drug abuse Brother   . Depression Sister   . Anesthesia problems Neg Hx    Social History:  reports that she has never smoked. She has never used smokeless tobacco. She reports that she does not drink alcohol or use illicit drugs.  Allergies:  Allergies  Allergen Reactions  . Ciprofloxacin Nausea And Vomiting  . Clarithromycin Hives  . Moxifloxacin Hives  . Reglan (Metoclopramide) Other (See Comments)    Caused a dystonic reaction.    No prescriptions prior to admission    No results found for this or any previous visit (from the past 48 hour(s)). No results found.  Review of Systems  All other systems reviewed and are negative.    Last menstrual period 11/02/2011. Physical Exam  Constitutional: She is oriented to person, place, and time. She appears well-developed and well-nourished. No distress.  HENT:  Head: Normocephalic and atraumatic.  Eyes: Conjunctivae normal are normal. Pupils are equal, round, and reactive to light.  Neck: Normal range of motion. Neck supple.  Cardiovascular: Normal rate, regular rhythm, normal heart sounds and intact distal pulses.   Respiratory: Effort normal. No respiratory distress.  GI: Soft. Bowel sounds are normal. She exhibits  no distension. There is no tenderness. There is no rebound.  Musculoskeletal: Normal range of motion.  Neurological: She is alert and oriented to person, place, and time.  Skin: Skin is warm and dry. She is not diaphoretic.  Psychiatric: Her behavior is normal. Judgment normal.     Assessment/Plan Low-grade neoplasm of the appendix and probable chronic cholecystitis  After a long discussion and after the opinion of multiple physicians, she will be undergoing a laparoscopic  cholecystectomy and a laparoscopic assisted partial colectomy. The risks were discussed in detail. These include but are not limited to bleeding, infection, anastomotic leak, anastomotic breakdown, injury to the bile ducts, bile leak, injury to other structures, the chance this may not resolve her symptoms, etc. She understands and agrees to proceed. Likelihood of success is good .  [name] A 01/27/2012, 7:06 AM

## 2012-01-27 NOTE — Preoperative (Signed)
Beta Blockers   Reason not to administer Beta Blockers:Not Applicable 

## 2012-01-27 NOTE — Anesthesia Procedure Notes (Signed)
Procedure Name: Intubation Date/Time: 01/27/2012 12:52 PM Performed by: Marena Chancy Pre-anesthesia Checklist: Patient identified, Timeout performed, Emergency Drugs available, Suction available and Patient being monitored Patient Re-evaluated:Patient Re-evaluated prior to inductionOxygen Delivery Method: Circle system utilized Preoxygenation: Pre-oxygenation with 100% oxygen Intubation Type: IV induction Ventilation: Mask ventilation without difficulty and Oral airway inserted - appropriate to patient size Laryngoscope Size: Hyacinth Meeker and 2 Grade View: Grade II Tube type: Oral Tube size: 7.5 mm Number of attempts: 1 Placement Confirmation: ETT inserted through vocal cords under direct vision,  breath sounds checked- equal and bilateral and positive ETCO2 Secured at: 21 cm Tube secured with: Tape Dental Injury: Teeth and Oropharynx as per pre-operative assessment

## 2012-01-27 NOTE — Transfer of Care (Signed)
Immediate Anesthesia Transfer of Care Note  Patient: [name]  Procedure(s) Performed: Procedure(s) (LRB) with comments: LAPAROSCOPIC PARTIAL COLECTOMY (N/A) - Laparoscopic Partial colectomy  LAPAROSCOPIC CHOLECYSTECTOMY (N/A) - Laparoscopic chole  Patient Location: PACU  Anesthesia Type: General  Level of Consciousness: awake, alert  and oriented  Airway & Oxygen Therapy: Patient Spontanous Breathing and Patient connected to face mask oxygen  Post-op Assessment: Report given to PACU RN and Post -op Vital signs reviewed and stable  Post vital signs: Reviewed and stable  Complications: No apparent anesthesia complications

## 2012-01-27 NOTE — Op Note (Signed)
LAPAROSCOPIC PARTIAL COLECTOMY, LAPAROSCOPIC CHOLECYSTECTOMY  Procedure Note  [name] 01/27/2012   Pre-op Diagnosis: APPENDIX NEOPLASM & BILIARY COLIC     Post-op Diagnosis: same  Procedure(s): LAPAROSCOPIC PARTIAL COLECTOMY LAPAROSCOPIC CHOLECYSTECTOMY  Surgeon(s): Wilmon Arms. Corliss Skains, MD Shelly Rubenstein, MD  Anesthesia: General  Staff:  Pauletta Browns, RN - Relief Scrub Ezzard Flax, RN - Relief Circulator Doy Mince, RN - Scrub Person Megan Day Hamel, RN - Circulator Jolinda Croak, RN - Circulator Assistant Terese Door, RN - Circulator Assistant Janeece Agee Pingue, CST - Scrub Person  Estimated Blood Loss: Minimal               Specimens: gallbladder, right colon          [name]   Date: 01/27/2012  Time: 2:36 PM

## 2012-01-28 LAB — CBC
Hemoglobin: 8.9 g/dL — ABNORMAL LOW (ref 12.0–15.0)
MCHC: 32 g/dL (ref 30.0–36.0)
Platelets: 269 10*3/uL (ref 150–400)
RBC: 3.38 MIL/uL — ABNORMAL LOW (ref 3.87–5.11)

## 2012-01-28 LAB — BASIC METABOLIC PANEL
Calcium: 8.4 mg/dL (ref 8.4–10.5)
GFR calc non Af Amer: >90 mL/min (ref >90)
Glucose, Bld: 128 mg/dL — ABNORMAL HIGH (ref 70–99)
Potassium: 4.3 mEq/L (ref 3.5–5.1)
Sodium: 138 mEq/L (ref 135–145)

## 2012-01-28 MED ORDER — SERTRALINE HCL 100 MG PO TABS
200.0000 mg | ORAL_TABLET | Freq: Every day | ORAL | Status: DC
  Administered 2012-01-31 – 2012-02-01 (×3): 200 mg via ORAL
  Filled 2012-01-28 (×5): qty 2

## 2012-01-28 MED ORDER — PANTOPRAZOLE SODIUM 40 MG IV SOLR
40.0000 mg | Freq: Every day | INTRAVENOUS | Status: DC
  Administered 2012-01-28 – 2012-02-01 (×6): 40 mg via INTRAVENOUS
  Filled 2012-01-28 (×7): qty 40

## 2012-01-28 MED ORDER — LEVOTHYROXINE SODIUM 150 MCG PO TABS
150.0000 ug | ORAL_TABLET | Freq: Every day | ORAL | Status: DC
  Administered 2012-01-29 – 2012-02-02 (×5): 150 ug via ORAL
  Filled 2012-01-28 (×6): qty 1

## 2012-01-28 MED ORDER — BIOTENE DRY MOUTH MT LIQD
15.0000 mL | OROMUCOSAL | Status: DC | PRN
  Administered 2012-01-28: 15 mL via OROMUCOSAL

## 2012-01-28 NOTE — Progress Notes (Signed)
1 Day Post-Op  Subjective: POD#1 comfortable  Objective: Vital signs in last 24 hours: Temp:  [97.3 F (36.3 C)-98.3 F (36.8 C)] 98.2 F (36.8 C) (10/15 0533) Pulse Rate:  [68-89] 78  (10/15 0533) Resp:  [12-20] 20  (10/15 0533) BP: (94-153)/(44-92) 96/51 mmHg (10/15 0533) SpO2:  [96 %-100 %] 100 % (10/15 0533) FiO2 (%):  [2 %] 2 % (10/14 1648) Weight:  [276 lb 1.6 oz (125.238 kg)] 276 lb 1.6 oz (125.238 kg) (10/14 1648)    Intake/Output from previous day: 10/14 0701 - 10/15 0700 In: 3458 [I.V.:3458] Out: 1250 [Urine:1250] Intake/Output this shift: Total I/O In: -  Out: 60 [Urine:60]  Abdomen soft, dressing dry, non distended  Lab Results:   Basename 01/28/12 0430  WBC 10.7*  HGB 8.9*  HCT 27.8*  PLT 269   BMET  Basename 01/28/12 0430  NA 138  K 4.3  CL 103  CO2 28  GLUCOSE 128*  BUN 9  CREATININE 0.62  CALCIUM 8.4   PT/INR No results found for this basename: LABPROT:2,INR:2 in the last 72 hours ABG No results found for this basename: PHART:2,PCO2:2,PO2:2,HCO3:2 in the last 72 hours  Studies/Results: No results found.  Anti-infectives: Anti-infectives     Start     Dose/Rate Route Frequency Ordered Stop   01/27/12 1800   ertapenem (INVANZ) 1 g in sodium chloride 0.9 % 50 mL IVPB        1 g 100 mL/hr over 30 Minutes Intravenous Every 24 hours 01/27/12 1702 01/28/12 1759   01/26/12 0933   ertapenem (INVANZ) 1 g in sodium chloride 0.9 % 50 mL IVPB        1 g 100 mL/hr over 30 Minutes Intravenous 60 min pre-op 01/26/12 0933 01/27/12 1245          Assessment/Plan: s/p Procedure(s) (LRB) with comments: LAPAROSCOPIC PARTIAL COLECTOMY (N/A) - Laparoscopic Assisted colectomy  LAPAROSCOPIC CHOLECYSTECTOMY (N/A) - Laparoscopic chole  D/C foley Start clear liquids Out of bed  LOS: 1 day    Cassia Fein A 01/28/2012

## 2012-01-28 NOTE — Progress Notes (Signed)
Patient evaluated for long-term disease management services with [facility] Care Management Program as benefit of her [facility]. Patient will receive a post discharge transition of care call upon discharge. Spoke with [name] at bedside to explain [facility] Care Management services and Link to Wellness program. Left brochure and contact information at bedside. [name] appreciative of visit.  Arvella Merles, RN,BSN  [facility] Liaison [phone]

## 2012-01-29 ENCOUNTER — Encounter (HOSPITAL_COMMUNITY): Payer: Self-pay | Admitting: Surgery

## 2012-01-29 LAB — BASIC METABOLIC PANEL
CO2: 24 mEq/L (ref 19–32)
Calcium: 8 mg/dL — ABNORMAL LOW (ref 8.4–10.5)
Chloride: 102 mEq/L (ref 96–112)
Glucose, Bld: 112 mg/dL — ABNORMAL HIGH (ref 70–99)
Potassium: 4 mEq/L (ref 3.5–5.1)
Sodium: 135 mEq/L (ref 135–145)

## 2012-01-29 LAB — CBC
Hemoglobin: 7.5 g/dL — ABNORMAL LOW (ref 12.0–15.0)
MCH: 25.7 pg — ABNORMAL LOW (ref 26.0–34.0)
Platelets: 220 10*3/uL (ref 150–400)
RBC: 2.92 MIL/uL — ABNORMAL LOW (ref 3.87–5.11)
WBC: 6.6 10*3/uL (ref 4.0–10.5)

## 2012-01-29 MED ORDER — KETOROLAC TROMETHAMINE 30 MG/ML IJ SOLN
30.0000 mg | Freq: Four times a day (QID) | INTRAMUSCULAR | Status: DC | PRN
  Administered 2012-01-29 – 2012-01-30 (×2): 30 mg via INTRAVENOUS
  Filled 2012-01-29 (×2): qty 1

## 2012-01-29 MED ORDER — KETOROLAC TROMETHAMINE 30 MG/ML IJ SOLN: 30.0000 mg | Freq: Four times a day (QID) | INTRAMUSCULAR | Status: DC

## 2012-01-29 NOTE — Progress Notes (Signed)
2 Days Post-Op  Subjective: POD#2 Slight fullness, tiny flatus  Objective: Vital signs in last 24 hours: Temp:  [98.2 F (36.8 C)-98.7 F (37.1 C)] 98.4 F (36.9 C) (10/16 0750) Pulse Rate:  [83-93] 90  (10/16 0750) Resp:  [15-23] 20  (10/16 0750) BP: (95-110)/(55-69) 109/55 mmHg (10/16 0750) SpO2:  [94 %-100 %] 96 % (10/16 0750) Last BM Date: 01/22/12  Intake/Output from previous day: 10/15 0701 - 10/16 0700 In: 2499.7 [P.O.:360; I.V.:2139.7] Out: 1235 [Urine:1235] Intake/Output this shift:    Abdomen soft, incisions clean  Lab Results:   [facility] 01/29/12 0635 01/28/12 0430  WBC 6.6 10.7*  HGB 7.5* 8.9*  HCT 24.3* 27.8*  PLT 220 269   BMET  Basename 01/28/12 0430  NA 138  K 4.3  CL 103  CO2 28  GLUCOSE 128*  BUN 9  CREATININE 0.62  CALCIUM 8.4   PT/INR No results found for this basename: LABPROT:2,INR:2 in the last 72 hours ABG No results found for this basename: PHART:2,PCO2:2,PO2:2,HCO3:2 in the last 72 hours  Studies/Results: No results found.  Anti-infectives: Anti-infectives     Start     Dose/Rate Route Frequency Ordered Stop   01/27/12 1800   ertapenem (INVANZ) 1 g in sodium chloride 0.9 % 50 mL IVPB        1 g 100 mL/hr over 30 Minutes Intravenous Every 24 hours 01/27/12 1702 01/28/12 1240   01/26/12 0933   ertapenem (INVANZ) 1 g in sodium chloride 0.9 % 50 mL IVPB        1 g 100 mL/hr over 30 Minutes Intravenous 60 min pre-op 01/26/12 0933 01/27/12 1245          Assessment/Plan: s/p Procedure(s) (LRB) with comments: LAPAROSCOPIC PARTIAL COLECTOMY (N/A) - Laparoscopic Assisted colectomy  LAPAROSCOPIC CHOLECYSTECTOMY (N/A) - Laparoscopic chole  Ambulate Keep on clear liquids  LOS: 2 days    [name] A 01/29/2012

## 2012-01-30 MED ORDER — DIPHENHYDRAMINE HCL 12.5 MG/5ML PO ELIX
12.5000 mg | ORAL_SOLUTION | Freq: Four times a day (QID) | ORAL | Status: DC | PRN
  Filled 2012-01-30: qty 5

## 2012-01-30 MED ORDER — ONDANSETRON HCL 4 MG/2ML IJ SOLN
4.0000 mg | Freq: Four times a day (QID) | INTRAMUSCULAR | Status: DC | PRN
  Administered 2012-02-02: 4 mg via INTRAVENOUS
  Filled 2012-01-30: qty 2

## 2012-01-30 MED ORDER — HYDROMORPHONE 0.3 MG/ML IV SOLN
INTRAVENOUS | Status: DC
  Administered 2012-01-30: via INTRAVENOUS
  Administered 2012-01-31: 3.9 mg via INTRAVENOUS
  Administered 2012-01-31: 3 mg via INTRAVENOUS
  Administered 2012-01-31: 1.5 mg via INTRAVENOUS
  Administered 2012-01-31: 0.999 mg via INTRAVENOUS
  Administered 2012-01-31: 1.2 mg via INTRAVENOUS
  Administered 2012-02-01: 2.4 mg via INTRAVENOUS
  Administered 2012-02-01: 4.5 mg via INTRAVENOUS
  Administered 2012-02-01: 1.8 mg via INTRAVENOUS
  Administered 2012-02-01: 18:00:00 via INTRAVENOUS
  Administered 2012-02-01: 1.5 mg via INTRAVENOUS
  Administered 2012-02-01: 09:00:00 via INTRAVENOUS
  Filled 2012-01-30 (×5): qty 25

## 2012-01-30 MED ORDER — HYDROMORPHONE HCL PF 1 MG/ML IJ SOLN
1.0000 mg | INTRAMUSCULAR | Status: DC | PRN
  Administered 2012-01-30 – 2012-02-02 (×9): 1 mg via INTRAVENOUS
  Filled 2012-01-30 (×9): qty 1

## 2012-01-30 MED ORDER — NALOXONE HCL 0.4 MG/ML IJ SOLN: 0.4000 mg | INTRAMUSCULAR | Status: DC | PRN

## 2012-01-30 MED ORDER — DIPHENHYDRAMINE HCL 50 MG/ML IJ SOLN
12.5000 mg | Freq: Four times a day (QID) | INTRAMUSCULAR | Status: DC | PRN
  Administered 2012-02-01: 12.5 mg via INTRAVENOUS
  Filled 2012-01-30 (×2): qty 1

## 2012-01-30 MED ORDER — OXYCODONE-ACETAMINOPHEN 5-325 MG PO TABS
1.0000 | ORAL_TABLET | ORAL | Status: DC | PRN
  Administered 2012-01-30 – 2012-02-02 (×12): 2 via ORAL
  Filled 2012-01-30 (×12): qty 2

## 2012-01-30 MED ORDER — KETOROLAC TROMETHAMINE 30 MG/ML IJ SOLN
30.0000 mg | Freq: Three times a day (TID) | INTRAMUSCULAR | Status: AC | PRN
  Administered 2012-01-31 – 2012-02-01 (×3): 30 mg via INTRAVENOUS
  Filled 2012-01-30 (×3): qty 1

## 2012-01-30 MED ORDER — SODIUM CHLORIDE 0.9 % IJ SOLN: 9.0000 mL | INTRAMUSCULAR | Status: DC | PRN

## 2012-01-30 NOTE — Progress Notes (Signed)
3 Days Post-Op  Subjective: POD#3 Passing flatus Feeling better Minimal pain  Objective: Vital signs in last 24 hours: Temp:  [97.9 F (36.6 C)-98.8 F (37.1 C)] 97.9 F (36.6 C) (10/17 0552) Pulse Rate:  [87-105] 90  (10/17 0552) Resp:  [13-26] 18  (10/17 0552) BP: (101-114)/(55-71) 101/66 mmHg (10/17 0552) SpO2:  [92 %-100 %] 100 % (10/17 0552) Last BM Date: 01/22/12  Intake/Output from previous day: 10/16 0701 - 10/17 0700 In: 3078 [P.O.:600; I.V.:2478] Out: 2700 [Urine:2700] Intake/Output this shift: Total I/O In: 1678 [I.V.:1678] Out: 1450 [Urine:1450]  Abdomen soft, non distended  Lab Results:   Basename 01/29/12 0635 01/28/12 0430  WBC 6.6 10.7*  HGB 7.5* 8.9*  HCT 24.3* 27.8*  PLT 220 269   BMET  Basename 01/29/12 0635 01/28/12 0430  NA 135 138  K 4.0 4.3  CL 102 103  CO2 24 28  GLUCOSE 112* 128*  BUN 9 9  CREATININE 0.75 0.62  CALCIUM 8.0* 8.4   PT/INR No results found for this basename: LABPROT:2,INR:2 in the last 72 hours ABG No results found for this basename: PHART:2,PCO2:2,PO2:2,HCO3:2 in the last 72 hours  Studies/Results: No results found.  Anti-infectives: Anti-infectives     Start     Dose/Rate Route Frequency Ordered Stop   01/27/12 1800   ertapenem (INVANZ) 1 g in sodium chloride 0.9 % 50 mL IVPB        1 g 100 mL/hr over 30 Minutes Intravenous Every 24 hours 01/27/12 1702 01/28/12 1240   01/26/12 0933   ertapenem (INVANZ) 1 g in sodium chloride 0.9 % 50 mL IVPB        1 g 100 mL/hr over 30 Minutes Intravenous 60 min pre-op 01/26/12 0933 01/27/12 1245          Assessment/Plan: s/p Procedure(s) (LRB) with comments: LAPAROSCOPIC PARTIAL COLECTOMY (N/A) - Laparoscopic Assisted colectomy  LAPAROSCOPIC CHOLECYSTECTOMY (N/A) - Laparoscopic chole  Advance diet D/C PCA  LOS: 3 days    [name] A 01/30/2012

## 2012-01-30 NOTE — Progress Notes (Signed)
Patient with c/o fullness and feeling distended. Patient requested to back diet down to clear liquids. Order placed. RN will continue to monitor.

## 2012-01-31 LAB — BASIC METABOLIC PANEL WITH GFR
BUN: 4 mg/dL — ABNORMAL LOW (ref 6–23)
CO2: 26 meq/L (ref 19–32)
Calcium: 8.5 mg/dL (ref 8.4–10.5)
Chloride: 104 meq/L (ref 96–112)
Creatinine, Ser: 0.64 mg/dL (ref 0.50–1.10)
GFR calc Af Amer: >90 mL/min
GFR calc non Af Amer: >90 mL/min
Glucose, Bld: 94 mg/dL (ref 70–99)
Potassium: 3.9 meq/L (ref 3.5–5.1)
Sodium: 140 meq/L (ref 135–145)

## 2012-01-31 LAB — CBC
HCT: 22.7 % — ABNORMAL LOW (ref 36.0–46.0)
Hemoglobin: 7.1 g/dL — ABNORMAL LOW (ref 12.0–15.0)
MCH: 25.9 pg — ABNORMAL LOW (ref 26.0–34.0)
MCHC: 31.3 g/dL (ref 30.0–36.0)
MCV: 82.8 fL (ref 78.0–100.0)
Platelets: 249 K/uL (ref 150–400)
RBC: 2.74 MIL/uL — ABNORMAL LOW (ref 3.87–5.11)
RDW: 15.3 % (ref 11.5–15.5)
WBC: 6 K/uL (ref 4.0–10.5)

## 2012-01-31 NOTE — Progress Notes (Signed)
4 Days Post-Op  Subjective: POD#4 Had bloating and increased abdominal pain last night.  PCA restarted and pt backed down to clear liquids Feels better this morning.  Pain less.  +BM  Objective: Vital signs in last 24 hours: Temp:  [97.9 F (36.6 C)-98.9 F (37.2 C)] 97.9 F (36.6 C) (10/18 0624) Pulse Rate:  [76-100] 95  (10/18 0624) Resp:  [16-29] 18  (10/18 0624) BP: (101-145)/(62-111) 145/111 mmHg (10/18 0624) SpO2:  [93 %-100 %] 94 % (10/18 0624) Last BM Date: 01/30/12  Intake/Output from previous day: 10/17 0701 - 10/18 0700 In: 2130.8 [P.O.:360; I.V.:1770.8] Out: 1025 [Urine:1025] Intake/Output this shift:    Abdomen soft, non distended, minimally tender. Incisions clean  Lab Results:   [facility] 01/29/12 0635  WBC 6.6  HGB 7.5*  HCT 24.3*  PLT 220   BMET  Basename 01/29/12 0635  NA 135  K 4.0  CL 102  CO2 24  GLUCOSE 112*  BUN 9  CREATININE 0.75  CALCIUM 8.0*   PT/INR No results found for this basename: LABPROT:2,INR:2 in the last 72 hours ABG No results found for this basename: PHART:2,PCO2:2,PO2:2,HCO3:2 in the last 72 hours  Studies/Results: No results found.  Anti-infectives: Anti-infectives     Start     Dose/Rate Route Frequency Ordered Stop   01/27/12 1800   ertapenem (INVANZ) 1 g in sodium chloride 0.9 % 50 mL IVPB        1 g 100 mL/hr over 30 Minutes Intravenous Every 24 hours 01/27/12 1702 01/28/12 1240   01/26/12 0933   ertapenem (INVANZ) 1 g in sodium chloride 0.9 % 50 mL IVPB        1 g 100 mL/hr over 30 Minutes Intravenous 60 min pre-op 01/26/12 0933 01/27/12 1245          Assessment/Plan: s/p Procedure(s) (LRB) with comments: LAPAROSCOPIC PARTIAL COLECTOMY (N/A) - Laparoscopic Assisted colectomy  LAPAROSCOPIC CHOLECYSTECTOMY (N/A) - Laparoscopic chole  Keep on liquids Check CBC and BMP this morning  LOS: 4 days    Danon Lograsso A 01/31/2012

## 2012-02-01 MED ORDER — OXYCODONE-ACETAMINOPHEN 5-325 MG PO TABS: 1.0000 | ORAL_TABLET | ORAL | Status: DC | PRN

## 2012-02-01 NOTE — Progress Notes (Signed)
5 Days Post-Op  Subjective: POD#5 Much less pain today +BM Ambulating better  Objective: Vital signs in last 24 hours: Temp:  [97.7 F (36.5 C)-99 F (37.2 C)] 98.4 F (36.9 C) (10/19 0540) Pulse Rate:  [80-88] 80  (10/19 0540) Resp:  [18-20] 20  (10/19 0540) BP: (102-146)/(57-88) 129/69 mmHg (10/19 0540) SpO2:  [95 %-100 %] 100 % (10/19 0540) Last BM Date: 01/31/12  Intake/Output from previous day: 10/18 0701 - 10/19 0700 In: 2305 [P.O.:600; I.V.:1705] Out: 2175 [Urine:2175] Intake/Output this shift: Total I/O In: 1705 [I.V.:1705] Out: 950 [Urine:950]  Abdomen soft, incisions stable Non distended, minimally tender  Lab Results:   Basename 01/31/12 0815  WBC 6.0  HGB 7.1*  HCT 22.7*  PLT 249   BMET  Basename 01/31/12 0815  NA 140  K 3.9  CL 104  CO2 26  GLUCOSE 94  BUN 4*  CREATININE 0.64  CALCIUM 8.5   PT/INR No results found for this basename: LABPROT:2,INR:2 in the last 72 hours ABG No results found for this basename: PHART:2,PCO2:2,PO2:2,HCO3:2 in the last 72 hours  Studies/Results: No results found.  Anti-infectives: Anti-infectives     Start     Dose/Rate Route Frequency Ordered Stop   01/27/12 1800   ertapenem (INVANZ) 1 g in sodium chloride 0.9 % 50 mL IVPB        1 g 100 mL/hr over 30 Minutes Intravenous Every 24 hours 01/27/12 1702 01/28/12 1240   01/26/12 0933   ertapenem (INVANZ) 1 g in sodium chloride 0.9 % 50 mL IVPB        1 g 100 mL/hr over 30 Minutes Intravenous 60 min pre-op 01/26/12 0933 01/27/12 1245          Assessment/Plan: s/p Procedure(s) (LRB) with comments: LAPAROSCOPIC PARTIAL COLECTOMY (N/A) - Laparoscopic Assisted colectomy  LAPAROSCOPIC CHOLECYSTECTOMY (N/A) - Laparoscopic chole  Advance PO Plan discharge tomorrow  LOS: 5 days    Rilea Arutyunyan A 02/01/2012

## 2012-02-01 NOTE — Discharge Summary (Signed)
Physician Discharge Summary  Patient ID: [name] MRN: [id number] DOB/AGE: January 29, 1971 41 y.o.  Admit date: 01/27/2012 Discharge date: 02/02/12  Admission Diagnoses:  Discharge Diagnoses:  Active Problems:  * No active hospital problems. 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  Quick return to bowel function. Diet advanced  Consults: None  Significant Diagnostic Studies: 0  Treatments: surgery: laparoscopic right partial colectomy, lap chole  Discharge Exam: Blood pressure 129/69, pulse 80, temperature 98.4 F (36.9 C), temperature source Oral, resp. rate 20, height 5\' 4"  (1.626 m), weight 276 lb 1.6 oz (125.238 kg), last menstrual period 11/02/2011, SpO2 100.00%. General appearance: alert and no distress Resp: clear to auscultation bilaterally Cardio: regular rate and rhythm, S1, S2 normal, no murmur, click, rub or gallop Incision/Wound:clean  Disposition: 01-Home or Self Care  Discharge Orders    Future Appointments: Provider: Department: Dept Phone: Center:   02/04/2012 12:00 PM Shelly Rubenstein, MD [facility] [phone] None   02/20/2012 9:00 AM Pck Nurse [facility] [phone] None       Medication List     As of 02/01/2012  6:50 AM    ASK your doctor about these medications         amoxicillin-clavulanate 875-125 MG per tablet   Commonly known as: AUGMENTIN   Take 1 tablet by mouth 2 (two) times daily. Started 01-20-12 ending 01-30-12 for 10 days      clonazePAM 0.5 MG tablet   Commonly known as: KLONOPIN   Take 1 mg by mouth at bedtime.      cyanocobalamin 1000 MCG/ML injection   Commonly known as: (VITAMIN B-12)   Inject 1 mL (1,000 mcg total) into the muscle every 30 (thirty) days.      esomeprazole 40 MG capsule   Commonly known as: NEXIUM   Take 1 capsule (40 mg total) by mouth daily.      fluticasone 50 MCG/ACT  nasal spray   Commonly known as: FLONASE   One spray in each nostril twice a day, use left hand for right nostril, and right hand for left nostril.      Fluticasone-Salmeterol 250-50 MCG/DOSE Aepb   Commonly known as: ADVAIR   Inhale 1 puff into the lungs daily.      HYDROcodone-acetaminophen 10-325 MG per tablet   Commonly known as: NORCO   Take 1 tablet by mouth every 8 (eight) hours as needed for pain.      INTEGRA PLUS Caps   Take 1 capsule by mouth daily.      levothyroxine 150 MCG tablet   Commonly known as: SYNTHROID, LEVOTHROID   Take 1 tablet (150 mcg total) by mouth daily.      promethazine 25 MG tablet   Commonly known as: PHENERGAN   Take 1 tablet (25 mg total) by mouth every 6 (six) hours as needed for nausea.      sertraline 100 MG tablet   Commonly known as: ZOLOFT   Take 2 tablets (200 mg total) by mouth daily.           Follow-up Information    Follow up with [facility] A, MD. On 02/04/2012.   Contact information:   [address] [place] [address] [phone]          Signed: Shelly Rubenstein 02/01/2012, 6:50 AM

## 2012-02-02 MED ORDER — HYDROCODONE-ACETAMINOPHEN 5-325 MG PO TABS: 1.0000 | ORAL_TABLET | ORAL | Status: DC | PRN

## 2012-02-02 MED ORDER — INFLUENZA VIRUS VACC SPLIT PF IM SUSP: 0.5000 mL | INTRAMUSCULAR | Status: DC

## 2012-02-02 MED ORDER — INFLUENZA VIRUS VACC SPLIT PF IM SUSP
0.5000 mL | Freq: Once | INTRAMUSCULAR | Status: AC
  Administered 2012-02-02: 0.5 mL via INTRAMUSCULAR
  Filled 2012-02-02: qty 0.5

## 2012-02-02 MED ORDER — POLYETHYLENE GLYCOL 3350 17 G PO PACK
17.0000 g | PACK | Freq: Every day | ORAL | Status: AC
  Administered 2012-02-02: 17 g via ORAL
  Filled 2012-02-02: qty 1

## 2012-02-02 NOTE — Progress Notes (Signed)
Pt vomited small amount of emesis (eggs from bfast) into basin, c/o soreness at abdominal incision site.  Medicated with IV Zofran and Dilaudid.  Dr. Lindie Spruce called and notified.

## 2012-02-02 NOTE — Progress Notes (Signed)
6 Days Post-Op  Subjective: Eating better. Wants to go home. Has had a bowel movement but not today. Passing lots of flatus.  Objective: Vital signs in last 24 hours: Temp:  [97.7 F (36.5 C)-98.8 F (37.1 C)] 97.7 F (36.5 C) (10/20 0648) Pulse Rate:  [83-88] 83  (10/20 0648) Resp:  [18-22] 18  (10/20 0648) BP: (110-144)/(68-81) 137/79 mmHg (10/20 0648) SpO2:  [97 %-100 %] 97 % (10/20 0752) Last BM Date: 02/01/12  Intake/Output from previous day: 10/19 0701 - 10/20 0700 In: 1241 [P.O.:600; I.V.:641] Out: 1925 [Urine:1925] Intake/Output this shift:    General appearance: looks good. No distress. Mental status normal. GI: abdomen soft. Not distended. Obese. Incision clean. Staples in place. Active bowel sounds. Minimal, appropriate tenderness.  Lab Results:  No results found for this or any previous visit (from the past 24 hour(s)).   Studies/Results: @RISRSLT24 @     . clonazePAM  1 mg Oral QHS  . enoxaparin (LOVENOX) injection  40 mg Subcutaneous Q24H  . Fluticasone-Salmeterol  1 puff Inhalation Daily  . HYDROmorphone PCA 0.3 mg/mL   Intravenous Q4H  . levothyroxine  150 mcg Oral Q0600  . pantoprazole (PROTONIX) IV  40 mg Intravenous QHS  . sertraline  200 mg Oral QHS  . DISCONTD: alvimopan  12 mg Oral BID     Assessment/Plan: s/p Procedure(s): LAPAROSCOPIC PARTIAL COLECTOMY LAPAROSCOPIC CHOLECYSTECTOMY  Doing well and meets discharge criteria. Discharge home today Diet and activities discussed She already has an appointment to see Dr. Magnus Ivan next Tuesday for staple removal  Patient Active Hospital Problem List: No active hospital problems.   LOS: 6 days    [name] M 02/02/2012  . .prob

## 2012-02-02 NOTE — Progress Notes (Signed)
Pt discharged to home accomp by family, pt was wheeled out in a w/c.  All discharge instructions reviewed w/ pt and copy given.  Rx for Percocet given to pt and explained.  Wound care reviewed, pt is to FU with Dr. Magnus Ivan on 02/04/12 for staple removal.  Staples look unremarkable without signs of redness.  Pt able to eat sufficient amt. Of dinner and instructed to drink plenty of fluids once at home.  Pt understands what to call md for:  Dehydration, unrelieved pain, s/s of infection.  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  No complaints verbalized at time of discharge.

## 2012-02-03 ENCOUNTER — Ambulatory Visit (HOSPITAL_COMMUNITY): Payer: Self-pay | Admitting: Psychiatry

## 2012-02-04 ENCOUNTER — Encounter (INDEPENDENT_AMBULATORY_CARE_PROVIDER_SITE_OTHER): Payer: Self-pay | Admitting: Surgery

## 2012-02-04 ENCOUNTER — Ambulatory Visit (INDEPENDENT_AMBULATORY_CARE_PROVIDER_SITE_OTHER): Payer: Commercial Managed Care - PPO | Admitting: Surgery

## 2012-02-04 VITALS — BP 130/86 | HR 83 | Temp 98.6°F | Resp 16 | Ht 64.0 in | Wt 276.8 lb

## 2012-02-04 DIAGNOSIS — Z09 Encounter for follow-up examination after completed treatment for conditions other than malignant neoplasm: Secondary | ICD-10-CM

## 2012-02-04 NOTE — Progress Notes (Signed)
Subjective:     Patient ID: [name], female   DOB: 1971-03-09, 41 y.o.   MRN: [id number]  HPI She is here for her first postoperative visit status post right partial colectomy and cholecystectomy for a low-grade mucinous neoplasm of the appendix and chronic cholecystitis. Other than constipation she is doing well Review of Systems     Objective:   Physical Exam The final pathology showed chronic cholecystitis with gallbladder. There was no residual mucin or tumor in the right colon and specifically the appendiceal orifice   on exam, her incisions are healing well. I removed her staples Assessment:     Patient stable postop    Plan:     She will need to refrain from heavy lifting for at least 5 more weeks.   I will see her back in 2 weeks

## 2012-02-10 ENCOUNTER — Ambulatory Visit (INDEPENDENT_AMBULATORY_CARE_PROVIDER_SITE_OTHER): Payer: Commercial Managed Care - PPO | Admitting: General Surgery

## 2012-02-10 ENCOUNTER — Encounter (INDEPENDENT_AMBULATORY_CARE_PROVIDER_SITE_OTHER): Payer: Self-pay | Admitting: General Surgery

## 2012-02-10 VITALS — BP 152/78 | HR 84 | Temp 97.1°F | Resp 20 | Ht 64.0 in | Wt 264.0 lb

## 2012-02-10 DIAGNOSIS — T8140XA Infection following a procedure, unspecified, initial encounter: Secondary | ICD-10-CM

## 2012-02-10 DIAGNOSIS — S31109A Unspecified open wound of abdominal wall, unspecified quadrant without penetration into peritoneal cavity, initial encounter: Secondary | ICD-10-CM

## 2012-02-10 DIAGNOSIS — T8149XA Infection following a procedure, other surgical site, initial encounter: Secondary | ICD-10-CM | POA: Insufficient documentation

## 2012-02-10 MED ORDER — AMOXICILLIN-POT CLAVULANATE 875-125 MG PO TABS: 1.0000 | ORAL_TABLET | Freq: Two times a day (BID) | ORAL | Status: AC

## 2012-02-10 MED ORDER — OXYCODONE-ACETAMINOPHEN 10-325 MG PO TABS: 1.0000 | ORAL_TABLET | Freq: Four times a day (QID) | ORAL | Status: DC | PRN

## 2012-02-10 NOTE — Progress Notes (Signed)
The patient is recovering from surgery by Dr. Carman Ching. Over the last 2 days she's developed swelling and tenderness in the upper midportion of the wound. She's also developed some drainage which has not been foul-smelling.  On examination she's got some serosanguineous drainage in the upper midportion of her wound. Was able to get a Q-tip in the wound after painting with Betadine. Approximately 5-6 cc of cloudy serous fluid was drained. I subsequently packed with about 8-9 inches of quarter-inch iodoform Nu Gauze. That is to be changed after about 2 days. Because the possibility of early infection in place the patient on Augmentin 825 mg by mouth twice a day.  The patient is also asking for more pain medication. I will prescribe her the Percocet tends as she has been prescribed previously by Dr. Magnus Ivan. She was returned to see Dr. Magnus Ivan on 02/18/2012.

## 2012-02-13 LAB — WOUND CULTURE
Gram Stain: NONE SEEN
Gram Stain: NONE SEEN

## 2012-02-14 ENCOUNTER — Other Ambulatory Visit (HOSPITAL_COMMUNITY): Payer: 59

## 2012-02-14 ENCOUNTER — Telehealth (INDEPENDENT_AMBULATORY_CARE_PROVIDER_SITE_OTHER): Payer: Self-pay | Admitting: General Surgery

## 2012-02-14 NOTE — Telephone Encounter (Signed)
Pt calling in to report she has developed new fever (100.5 F) with associated muscle aches and generalized malaise with nausea.  Her surgery was by Dr. Magnus Ivan on 01/27/12, and post op office visit was unremarkable on 02/06/12.  Over the weekend she developed swelling and tenderness, so was seen by Dr. Lindie Spruce on 02/10/12.  He opened and packed the wound (culture was negative) and started pt on Augmentin BID.  She did well until today.  Offered Urgent office again, but she does not have transportation.   Will check with on-call MD and advise.

## 2012-02-14 NOTE — Telephone Encounter (Signed)
Called pt with instructions from Dr. Andrey Campanile:  Continue antibiotics as ordered by Dr. Lindie Spruce, push PO fluids AMAP and monitor temperature.  Current low-grade temp and related symptoms are probably viral in origin.  Call back as needed.  Go to ER if temp > 102 F.  Pt is nurse at [employer]; she understands all instructions and will comply.

## 2012-02-17 ENCOUNTER — Ambulatory Visit (INDEPENDENT_AMBULATORY_CARE_PROVIDER_SITE_OTHER): Payer: 59 | Admitting: Psychiatry

## 2012-02-17 ENCOUNTER — Encounter: Payer: Self-pay | Admitting: Sports Medicine

## 2012-02-17 ENCOUNTER — Ambulatory Visit (INDEPENDENT_AMBULATORY_CARE_PROVIDER_SITE_OTHER): Payer: 59 | Admitting: Sports Medicine

## 2012-02-17 VITALS — BP 151/96 | HR 89 | Wt 261.0 lb

## 2012-02-17 VITALS — BP 135/93 | HR 92 | Ht 64.0 in | Wt 261.0 lb

## 2012-02-17 DIAGNOSIS — F4310 Post-traumatic stress disorder, unspecified: Secondary | ICD-10-CM

## 2012-02-17 DIAGNOSIS — D649 Anemia, unspecified: Secondary | ICD-10-CM

## 2012-02-17 DIAGNOSIS — F410 Panic disorder [episodic paroxysmal anxiety] without agoraphobia: Secondary | ICD-10-CM

## 2012-02-17 DIAGNOSIS — F4001 Agoraphobia with panic disorder: Secondary | ICD-10-CM

## 2012-02-17 DIAGNOSIS — B354 Tinea corporis: Secondary | ICD-10-CM

## 2012-02-17 MED ORDER — TERBINAFINE HCL 250 MG PO TABS: 250.0000 mg | ORAL_TABLET | Freq: Every day | ORAL | Status: DC

## 2012-02-17 MED ORDER — "NEEDLE (DISP) 23G X 1"" MISC": Status: DC

## 2012-02-17 MED ORDER — SYRINGE (DISPOSABLE) 1 ML MISC: Status: DC

## 2012-02-17 MED ORDER — PROMETHAZINE HCL 25 MG PO TABS: 25.0000 mg | ORAL_TABLET | Freq: Four times a day (QID) | ORAL | Status: DC | PRN

## 2012-02-17 NOTE — Assessment & Plan Note (Signed)
No specific supplementation needed after hemicolectomy. She will continue vitamin B12 injections at home. We'll supply her with syringes and needles. We'll also continue her iron supplementation for anemia.
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
## 2012-02-17 NOTE — Assessment & Plan Note (Signed)
Lamisil x 2 weeks. 

## 2012-02-17 NOTE — Progress Notes (Addendum)
Subjective:    CC: Followup  HPI:  Post surgery: Sana is doing very well after right hemi-colectomy with reanastomosis, cholecystectomy, and appendectomy for a mucinous neoplasm. She is having some loose stools, but has been using Lomotil for this. She's here today to discuss any specific vitamin supplementation she needs.  Anemia: Has a component of both iron deficiency, and B12 deficiency, it was recommended that she get B12 injections, and use oral iron. She desires to do the B12 injections at home herself, as she is a Engineer, civil (consulting).  Tinea corporis: After contact with a new pet, present also in her husband.  Past medical history, Surgical history, Family history, Social history, Allergies, and medications have been entered into the medical record, reviewed, and no changes needed.   Review of Systems: No fevers, chills, night sweats, weight loss, chest pain, or shortness of breath.   Objective:    General: Well Developed, well nourished, and in no acute distress.  Neuro: Alert and oriented x3, extra-ocular muscles intact.  HEENT: Normocephalic, atraumatic, pupils equal round reactive to light, neck supple, no masses, no lymphadenopathy, thyroid nonpalpable.  Skin: Warm and dry, tinea corporis present. Cardiac: Regular rate and rhythm, no murmurs rubs or gallops.  Respiratory: Clear to auscultation bilaterally. Not using accessory muscles, speaking in full sentences. Abdominal incision looks good, is nontender, and healed well.  Impression and Recommendations:

## 2012-02-18 ENCOUNTER — Ambulatory Visit (HOSPITAL_COMMUNITY): Payer: Self-pay | Admitting: Psychiatry

## 2012-02-18 ENCOUNTER — Ambulatory Visit (INDEPENDENT_AMBULATORY_CARE_PROVIDER_SITE_OTHER): Payer: Commercial Managed Care - PPO | Admitting: Surgery

## 2012-02-18 DIAGNOSIS — Z09 Encounter for follow-up examination after completed treatment for conditions other than malignant neoplasm: Secondary | ICD-10-CM

## 2012-02-18 NOTE — Progress Notes (Signed)
Subjective:     Patient ID: [name], female   DOB: 03-Sep-1970, 41 y.o.   MRN: [id number]  HPI She is here for another postoperative visit. She had to stop the Augmentin per Dr. Lindie Spruce had her on because of a GI virus.  She still has loose bowel movements. She saw her primary care physician and psychiatrist yesterday Review of Systems     Objective:   Physical Exam She actually looks good on appearance. Her incision is well healed and her abdomen is soft    Assessment:     Patient stable postop    Plan:     I will see her back in 2-3 weeks and then we will discuss when she will go back to work. She will still refrain from heavy lifting. I changed to hydrocodone

## 2012-02-20 ENCOUNTER — Ambulatory Visit: Payer: 59

## 2012-02-25 ENCOUNTER — Other Ambulatory Visit (HOSPITAL_COMMUNITY): Payer: Self-pay

## 2012-02-26 ENCOUNTER — Encounter (HOSPITAL_COMMUNITY): Payer: Self-pay | Admitting: Psychiatry

## 2012-02-26 NOTE — Telephone Encounter (Signed)
Patient reports she misunderstood change of clonazepam dosing schedule. She thought she was to take 0.5 mg TID instead of BID. She did report that her tumor is benign which was a great source of relief for her.   PLAN: Advised patient that she has to take clonazepam BID. Will ascertain if patient tolerates lower dose.

## 2012-03-02 ENCOUNTER — Ambulatory Visit (INDEPENDENT_AMBULATORY_CARE_PROVIDER_SITE_OTHER): Payer: 59 | Admitting: Sports Medicine

## 2012-03-02 ENCOUNTER — Encounter: Payer: Self-pay | Admitting: Sports Medicine

## 2012-03-02 ENCOUNTER — Other Ambulatory Visit (HOSPITAL_COMMUNITY): Payer: Self-pay | Admitting: Psychiatry

## 2012-03-02 ENCOUNTER — Encounter (INDEPENDENT_AMBULATORY_CARE_PROVIDER_SITE_OTHER): Payer: Self-pay | Admitting: General Surgery

## 2012-03-02 ENCOUNTER — Telehealth (INDEPENDENT_AMBULATORY_CARE_PROVIDER_SITE_OTHER): Payer: Self-pay | Admitting: General Surgery

## 2012-03-02 VITALS — BP 137/89 | HR 112 | Temp 99.4°F | Wt 257.0 lb

## 2012-03-02 DIAGNOSIS — R059 Cough, unspecified: Secondary | ICD-10-CM | POA: Insufficient documentation

## 2012-03-02 DIAGNOSIS — F4001 Agoraphobia with panic disorder: Secondary | ICD-10-CM

## 2012-03-02 DIAGNOSIS — R05 Cough: Secondary | ICD-10-CM | POA: Insufficient documentation

## 2012-03-02 MED ORDER — CLONAZEPAM 0.5 MG PO TABS: 0.5000 mg | ORAL_TABLET | Freq: Two times a day (BID) | ORAL | Status: DC | PRN

## 2012-03-02 MED ORDER — HYDROCODONE-HOMATROPINE 5-1.5 MG/5ML PO SYRP: 2.5000 mL | ORAL_SOLUTION | Freq: Four times a day (QID) | ORAL | Status: DC | PRN

## 2012-03-02 NOTE — Telephone Encounter (Signed)
[name] called to ask if Dr. Magnus Ivan could print a note/letter stating that she can return to work on December 7,2013 instead of 03-13-12, due to problem with disability co. It can be left at front desk for pick-up. If questions you can call her. [phone]. Thanks/gy

## 2012-03-02 NOTE — Telephone Encounter (Signed)
The patient had taken an extra clonazepam dose for one week while awaiting results of a biopsy.  She states she misunderstood this provider's instructions as to how the medications were to be used.  PLAN: 1. Patient will be out of clonazepam today.  As patient for not have a substance abuse history and has been compliant with medications, will fill medications at this point. Instructed patient that I would not fill medications early, in the future. 2. Will do blood drug screen.

## 2012-03-02 NOTE — Telephone Encounter (Signed)
Pt called to report a pulled muscle to the Lt of her umbilical incision.  She has tried ibuprofen and heat for the pain; cannot take naproxyn.  Suggested to try ice pack to site and discussed with Dr. Magnus Ivan.  He stated to use anti-inflammatories and hydrocodone, alternate ice and heat and allow time to heal.  She understands.

## 2012-03-02 NOTE — Progress Notes (Signed)
Subjective:    CC: Cough   HPI: [name] has noted several days of dry cough, rhinorrhea, sore throat, chest congestion. She's noted some low grade, essentially suggestive of fevers at home.  She denies any GI symptoms, denies any rashes. She's not doing anything to treat herself yet. Symptoms are moderate. Pain in chest associate coughing, does not radiate.  Past medical history, Surgical history, Family history, Social history, Allergies, and medications have been entered into the medical record, reviewed, and no changes needed.   Review of Systems: No fevers, chills, night sweats, weight loss, chest pain, or shortness of breath.   Objective:    General: Well Developed, well nourished, and in no acute distress.  Neuro: Alert and oriented x3, extra-ocular muscles intact.  HEENT: Normocephalic, atraumatic, pupils equal round reactive to light, neck supple, no masses, no lymphadenopathy, thyroid nonpalpable. Right turbinates are significantly boggy, with a significant amount of clear mucus. Oropharynx is unremarkable.  Skin: Warm and dry, no rashes. Cardiac: Regular rate and rhythm, no murmurs rubs or gallops.  Respiratory: Clear to auscultation bilaterally. Not using accessory muscles, speaking in full sentences.   Impression and Recommendations:

## 2012-03-02 NOTE — Assessment & Plan Note (Signed)
Symptoms are most likely related to rhinitis with postnasal drip syndrome. Hycodan, Flonase, DayQuil. Chest x-ray. Return to clinic as needed for this

## 2012-03-03 ENCOUNTER — Ambulatory Visit (INDEPENDENT_AMBULATORY_CARE_PROVIDER_SITE_OTHER): Payer: Self-pay

## 2012-03-03 DIAGNOSIS — R05 Cough: Secondary | ICD-10-CM

## 2012-03-03 DIAGNOSIS — R509 Fever, unspecified: Secondary | ICD-10-CM

## 2012-03-07 ENCOUNTER — Emergency Department
Admission: EM | Admit: 2012-03-07 | Discharge: 2012-03-07 | Disposition: A | Discharge status: Home / Self Care | Payer: 59 | Source: Home / Self Care

## 2012-03-07 DIAGNOSIS — J45901 Unspecified asthma with (acute) exacerbation: Secondary | ICD-10-CM

## 2012-03-07 DIAGNOSIS — J069 Acute upper respiratory infection, unspecified: Secondary | ICD-10-CM

## 2012-03-07 MED ORDER — ALBUTEROL SULFATE HFA 108 (90 BASE) MCG/ACT IN AERS: 2.0000 | INHALATION_SPRAY | RESPIRATORY_TRACT | Status: DC | PRN

## 2012-03-07 MED ORDER — DOXYCYCLINE HYCLATE 100 MG PO CAPS: 100.0000 mg | ORAL_CAPSULE | Freq: Two times a day (BID) | ORAL | Status: AC

## 2012-03-07 MED ORDER — HYDROCOD POLST-CHLORPHEN POLST 10-8 MG/5ML PO LQCR: 5.0000 mL | Freq: Two times a day (BID) | ORAL | Status: DC | PRN

## 2012-03-07 MED ORDER — DOXYCYCLINE HYCLATE 100 MG PO CAPS: 100.0000 mg | ORAL_CAPSULE | Freq: Two times a day (BID) | ORAL | Status: DC

## 2012-03-07 MED ORDER — PREDNISONE 50 MG PO TABS: ORAL_TABLET | ORAL | Status: DC

## 2012-03-07 MED ORDER — METHYLPREDNISOLONE SODIUM SUCC 125 MG IJ SOLR
125.0000 mg | Freq: Once | INTRAMUSCULAR | Status: AC
  Administered 2012-03-07: 125 mg via INTRAMUSCULAR

## 2012-03-07 NOTE — ED Provider Notes (Signed)
History     CSN: [id number]  Arrival date & time 03/07/12  1144   First MD Initiated Contact with Patient 03/07/12 1204      Chief Complaint  Patient presents with  . Cough    cough and congestion x 1 week   HPI  URI Symptoms Onset: 1 week  Description: nasal congestion, post nasal drip, cough, mild SOB, wheezing Modifying factors:  Baseline asthmatic, was seen for similar sxs earlier in the week. Had CXR that was negative.   Symptoms Nasal discharge: yes Fever: no Sore throat: no Cough: yes Wheezing: yes Ear pain: no GI symptoms: no Sick contacts: yes   Red Flags  Stiff neck: no Dyspnea: mild Rash: no Swallowing difficulty: no  Sinusitis Risk Factors Headache/face pain: no Double sickening: no tooth pain: no  Allergy Risk Factors Sneezing: no Itchy scratchy throat: no Seasonal symptoms: no  Flu Risk Factors Headache: no muscle aches: no severe fatigue: no   Past Medical History  Diagnosis Date  . Asthma   . Depression   . Anxiety   . GERD (gastroesophageal reflux disease)   . Hypothyroidism   . Endometriosis   . History of sinus surgery 2010    MAXILLARY, ETHMOID, SPHENOID  . Obesity, Class III, BMI 40-49.9 (morbid obesity)   . SVD (spontaneous vaginal delivery)     x3  . Generalized headaches   . Adenomyosis 2013  . Kidney stones   . Complication of anesthesia     scoline pain? from use of Succinylcholine   . Anemia     Past Surgical History  Procedure Date  . Knee surgery     right, scope and ACL repair  . Nasal sinus surgery 2010  . Colposcopy 2007  . Laparoscopy 05/30/2011    Procedure: LAPAROSCOPY OPERATIVE;  Surgeon: Juluis Mire, MD;  Location: [facility];  Service: Gynecology;  Laterality: N/A;  YAG  LASER of Endometriosis.  . Laparoscopic assisted vaginal hysterectomy 11/13/2011    Procedure: LAPAROSCOPIC ASSISTED VAGINAL HYSTERECTOMY;  Surgeon: Juluis Mire, MD;  Location: [facility];  Service: Gynecology;  Laterality: N/A;  .  Abdominal hysterectomy   . Laparoscopic appendectomy 01/02/2012    Procedure: APPENDECTOMY LAPAROSCOPIC;  Surgeon: Shelly Rubenstein, MD;  Location: [facility];  Service: General;  Laterality: N/A;  . Appendectomy   . Partial colectomy 01/27/2012  . Laparoscopic cholecystectomy 01/27/2012  . Cholecystectomy 01/27/2012    Procedure: LAPAROSCOPIC CHOLECYSTECTOMY;  Surgeon: Shelly Rubenstein, MD;  Location: [facility];  Service: General;  Laterality: N/A;  Laparoscopic chole    Family History  Problem Relation Age of Onset  . Lung cancer Father   . Alcohol abuse Father   . Colon cancer Paternal Grandmother   . Depression Mother   . Depression Sister   . Suicidality Brother   . Depression Brother   . Alcohol abuse Brother   . Drug abuse Brother   . Depression Sister   . Anesthesia problems Neg Hx     History  Substance Use Topics  . Smoking status: Never Smoker   . Smokeless tobacco: Never Used  . Alcohol Use: No    OB History    Grav Para Term Preterm Abortions TAB SAB Ect Mult Living   3 3 3       3       Review of Systems  All other systems reviewed and are negative.    Allergies  Ciprofloxacin; Clarithromycin; Moxifloxacin; and Reglan  Home Medications   Current  Outpatient Rx  Name  Route  Sig  Dispense  Refill  . CLONAZEPAM 0.5 MG PO TABS   Oral   Take 1 tablet (0.5 mg total) by mouth 2 (two) times daily as needed for anxiety.   30 tablet   0     Fill on or after 03/02/2012.   Marland Kitchen CYANOCOBALAMIN 1000 MCG/ML IJ SOLN   Intramuscular   Inject 1 mL (1,000 mcg total) into the muscle every 30 (thirty) days.   1 mL   0   . ESOMEPRAZOLE MAGNESIUM 40 MG PO CPDR   Oral   Take 1 capsule (40 mg total) by mouth daily.   90 capsule   3   . INTEGRA PLUS PO CAPS   Oral   Take 1 capsule by mouth daily.   30 capsule   4     Samples of this drug were given to the patient, qu ...   . FLUTICASONE PROPIONATE 50 MCG/ACT NA SUSP      One spray in each nostril twice a  day, use left hand for right nostril, and right hand for left nostril.   48 g   3   . FLUTICASONE-SALMETEROL 250-50 MCG/DOSE IN AEPB   Inhalation   Inhale 1 puff into the lungs daily.         Marland Kitchen HYDROCODONE-HOMATROPINE 5-1.5 MG/5ML PO SYRP   Oral   Take 2.5 mLs by mouth every 6 (six) hours as needed for cough.   180 mL   0   . LEVOTHYROXINE SODIUM 150 MCG PO TABS   Oral   Take 1 tablet (150 mcg total) by mouth daily.   90 tablet   1   . NEEDLE (DISP) 23G X 1" MISC      Use weekly for B12 injections. Quantity sufficient for 3 months.   1 each   11   . SERTRALINE HCL 100 MG PO TABS   Oral   Take 2 tablets (200 mg total) by mouth daily.   60 tablet   1   . SYRINGE (DISPOSABLE) 1 ML MISC      Use weekly for B12 injections. Quantity sufficient for 3 months.   1 each   0   . TERBINAFINE HCL 250 MG PO TABS   Oral   Take 1 tablet (250 mg total) by mouth daily.   14 tablet   0   . ALBUTEROL SULFATE HFA 108 (90 BASE) MCG/ACT IN AERS   Inhalation   Inhale 2 puffs into the lungs every 4 (four) hours as needed for wheezing (cough, shortness of breath or wheezing.).   1 Inhaler   1   . DOXYCYCLINE HYCLATE 100 MG PO CAPS   Oral   Take 1 capsule (100 mg total) by mouth 2 (two) times daily.   14 capsule   0   . PREDNISONE 50 MG PO TABS      1 tab daily x 5 days   5 tablet   0     BP 129/57  Pulse 87  Temp 97.9 F (36.6 C) (Oral)  Resp 20  Ht 5\' 4"  (1.626 m)  Wt 255 lb (115.667 kg)  BMI 43.77 kg/m2  SpO2 97%  LMP 11/02/2011  Physical Exam  Constitutional:       Obese, NAD    HENT:  Head: Normocephalic and atraumatic.  Right Ear: External ear normal.  Left Ear: External ear normal.       +nasal erythema, rhinorrhea bilaterally, +  post oropharyngeal erythema    Eyes: Conjunctivae normal are normal. Pupils are equal, round, and reactive to light.  Neck: Normal range of motion. Neck supple.  Cardiovascular: Normal rate, regular rhythm and normal heart  sounds.   Pulmonary/Chest: Effort normal.       Faint wheezes in bases    Abdominal: Soft.  Musculoskeletal: Normal range of motion.  Lymphadenopathy:    She has no cervical adenopathy.  Neurological: She is alert.  Skin: Skin is warm.    ED Course  Procedures (including critical care time)  Labs Reviewed - No data to display No results found.   1. URI (upper respiratory infection)   2. Asthma exacerbation       MDM  Solumedrol 125mg  IM x 1  Prednisone x 5 days.  Doxy for lower resp tract coverage.  Discussed supportive care and infectious red flags.  tussionex for cough.  Follow up with PCP in 7-10 days or sooner if not improved.      The patient and/or caregiver has been counseled thoroughly with regard to treatment plan and/or medications prescribed including dosage, schedule, interactions, rationale for use, and possible side effects and they verbalize understanding. Diagnoses and expected course of recovery discussed and will return if not improved as expected or if the condition worsens. Patient and/or caregiver verbalized understanding.             Doree Albee, MD 03/07/12 1254

## 2012-03-07 NOTE — ED Notes (Signed)
[name] complains of congestion and a productive cough with yellow sputum x 1 week. She was seen earlier this week by Dr [name]. She does complain of fever, chills and sweats.

## 2012-03-09 ENCOUNTER — Telehealth: Payer: Self-pay | Admitting: *Deleted

## 2012-03-09 ENCOUNTER — Ambulatory Visit (INDEPENDENT_AMBULATORY_CARE_PROVIDER_SITE_OTHER): Payer: Commercial Managed Care - PPO | Admitting: Surgery

## 2012-03-09 ENCOUNTER — Encounter (INDEPENDENT_AMBULATORY_CARE_PROVIDER_SITE_OTHER): Payer: Self-pay | Admitting: Surgery

## 2012-03-09 VITALS — BP 164/92 | HR 76 | Temp 97.8°F | Resp 18 | Ht 64.5 in | Wt 255.8 lb

## 2012-03-09 DIAGNOSIS — Z09 Encounter for follow-up examination after completed treatment for conditions other than malignant neoplasm: Secondary | ICD-10-CM

## 2012-03-09 MED ORDER — ONDANSETRON HCL 4 MG PO TABS: ORAL_TABLET | ORAL | Status: DC

## 2012-03-09 NOTE — Telephone Encounter (Signed)
Pt states she was seen in [facility] over the weekend and was placed on Doxicycline. States it is making her nauseated and would like to know if she can get some Zofran. Please advise.

## 2012-03-09 NOTE — Progress Notes (Signed)
Subjective:     Patient ID: [name], female   DOB: Aug 13, 1970, 41 y.o.   MRN: [id number]  HPI She is here for another postoperative visit. Since I saw her last, she now has had an attack of the flu which has exacerbated her asthma. She is now on steroids. She has had a lot of coughing and now has moderate incisional pain. As she has had no nausea or vomiting or obstructive symptoms.  Review of Systems     Objective:   Physical Exam On exam, I cannot feel a hernia but there is tenderness along her incision. Her lungs show occasional wheezes    Assessment:     Patient postop with exacerbation of abdominal discomfort secondary to flu and asthma    Plan:     She will not be ready to go back to work until December 7 because of her pain as well as infectious process and asthma. As she is on steroids, I do not want her to be in the hospital setting until that time. If the pain in her incision persists, she may need a CAT scan.

## 2012-03-09 NOTE — Telephone Encounter (Signed)
Yeah of course, I've sent in a Rx to [facility].

## 2012-03-09 NOTE — Telephone Encounter (Signed)
Pt informed

## 2012-03-16 ENCOUNTER — Ambulatory Visit (INDEPENDENT_AMBULATORY_CARE_PROVIDER_SITE_OTHER): Payer: 59 | Admitting: Sports Medicine

## 2012-03-16 ENCOUNTER — Encounter: Payer: Self-pay | Admitting: Sports Medicine

## 2012-03-16 ENCOUNTER — Ambulatory Visit (HOSPITAL_COMMUNITY): Payer: Self-pay | Admitting: Psychiatry

## 2012-03-16 VITALS — BP 141/93 | HR 92 | Wt 257.0 lb

## 2012-03-16 DIAGNOSIS — R05 Cough: Secondary | ICD-10-CM

## 2012-03-16 DIAGNOSIS — R059 Cough, unspecified: Secondary | ICD-10-CM

## 2012-03-16 MED ORDER — HYDROCODONE-HOMATROPINE 5-1.5 MG/5ML PO SYRP: 2.5000 mL | ORAL_SOLUTION | Freq: Four times a day (QID) | ORAL | Status: DC | PRN

## 2012-03-16 MED ORDER — PREDNISONE 50 MG PO TABS: ORAL_TABLET | ORAL | Status: DC

## 2012-03-16 NOTE — Progress Notes (Signed)
Subjective:    CC: Followup  HPI: [name] comes back to see [place] with persistence of her cough. I saw her approximately 2 weeks ago, diagnosed with a viral bronchitis, and give her some cough medication. This worked very well. Unfortunately her cough persisted and she went to urgent care, where she was given a shot of steroids, azithromycin, and more cough medication. This helped briefly, but now, 2 weeks after cough is still present. It is nonproductive.  She denies any chest pain, this is keeping her up at night. She desires more Hycodan.  Past medical history, Surgical history, Family history, Social history, Allergies, and medications have been entered into the medical record, reviewed, and no changes needed.   Review of Systems: No fevers, chills, night sweats, weight loss, chest pain, or shortness of breath.   Objective:    General: Well Developed, well nourished, and in no acute distress. Occasional coughing in the exam room. Neuro: Alert and oriented x3, extra-ocular muscles intact.  HEENT: Normocephalic, atraumatic, pupils equal round reactive to light, neck supple, no masses, no lymphadenopathy, thyroid nonpalpable.  Skin: Warm and dry, no rashes. Cardiac: Regular rate and rhythm, no murmurs rubs or gallops.  Respiratory: Clear to auscultation bilaterally. Not using accessory muscles, speaking in full sentences.  Impression and Recommendations:

## 2012-03-16 NOTE — Assessment & Plan Note (Addendum)
Cough is persistent though only present for 2 weeks now. She is status post a intramuscular steroid injection as well as azithromycin from the urgent care Center. She was better, but now is feeling somewhat worse. I will refill her Hycodan, I would also like to do another 5 day burst of prednisone. Postinfectious cough can last 4 weeks, this would give her an additional 2 weeks. 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

## 2012-03-18 ENCOUNTER — Ambulatory Visit: Payer: Self-pay | Admitting: Sports Medicine

## 2012-03-23 ENCOUNTER — Ambulatory Visit (INDEPENDENT_AMBULATORY_CARE_PROVIDER_SITE_OTHER): Payer: 59 | Admitting: Family Medicine

## 2012-03-23 ENCOUNTER — Encounter: Payer: Self-pay | Admitting: Family Medicine

## 2012-03-23 ENCOUNTER — Other Ambulatory Visit: Payer: Self-pay | Admitting: Sports Medicine

## 2012-03-23 VITALS — BP 158/90 | HR 94 | Temp 97.7°F | Wt 258.0 lb

## 2012-03-23 DIAGNOSIS — R05 Cough: Secondary | ICD-10-CM

## 2012-03-23 DIAGNOSIS — K219 Gastro-esophageal reflux disease without esophagitis: Secondary | ICD-10-CM

## 2012-03-23 DIAGNOSIS — H6090 Unspecified otitis externa, unspecified ear: Secondary | ICD-10-CM

## 2012-03-23 DIAGNOSIS — J329 Chronic sinusitis, unspecified: Secondary | ICD-10-CM

## 2012-03-23 DIAGNOSIS — H60399 Other infective otitis externa, unspecified ear: Secondary | ICD-10-CM

## 2012-03-23 MED ORDER — AMOXICILLIN-POT CLAVULANATE 500-125 MG PO TABS: ORAL_TABLET | ORAL | Status: DC

## 2012-03-23 MED ORDER — HYDROCODONE-HOMATROPINE 5-1.5 MG/5ML PO SYRP: 5.0000 mL | ORAL_SOLUTION | Freq: Four times a day (QID) | ORAL | Status: DC | PRN

## 2012-03-23 MED ORDER — ESOMEPRAZOLE MAGNESIUM 40 MG PO CPDR: 40.0000 mg | DELAYED_RELEASE_CAPSULE | Freq: Every day | ORAL | Status: DC

## 2012-03-23 MED ORDER — CIPROFLOXACIN-DEXAMETHASONE 0.3-0.1 % OT SUSP: 4.0000 [drp] | Freq: Two times a day (BID) | OTIC | Status: AC

## 2012-03-23 MED ORDER — CIPROFLOXACIN-HYDROCORTISONE 0.2-1 % OT SUSP: 3.0000 [drp] | Freq: Two times a day (BID) | OTIC | Status: AC

## 2012-03-23 MED ORDER — KETOROLAC TROMETHAMINE 60 MG/2ML IM SOLN
60.0000 mg | Freq: Once | INTRAMUSCULAR | Status: AC
  Administered 2012-03-23: 60 mg via INTRAMUSCULAR

## 2012-03-23 NOTE — Addendum Note (Signed)
Addended by: Laren Boom on: 03/23/2012 02:43 PM   Modules accepted: Orders

## 2012-03-23 NOTE — Progress Notes (Signed)
CC: [name] is a 41 y.o. female is here for right ear pain   Subjective: HPI:  Patient presents with concerns of right ear pain. It has been present for the past 3 days. Nothing seems to make it better or worse. He was painful enough last night where she had trouble sleeping. There's been no discharge hearing loss nor dizziness. Seems to be getting worse in a daily basis, moderate in severity right now. No interventions as of yet. Yesterday had subjective fevers but no objective temperature.  Right upper molars have been experiencing discomfort ever since the ear pain started. This pain is nonradiating and seems to get worse when leaning forward or lying down. She's also had some pressure underneath her right eye and in her right forehead that behaves similarly. She's had some clear nasal discharge. She denies motor or sensory disturbances, dysphagia, trouble swallowing.  She's had a cough that is slowly improving, mildly productive, but she is not experiencing shortness of breath or chest tightness. She continues on Advair and albuterol and just recently finished a round of prednisone. She has been using Hycodan to help with her sleep which has been working great, however her bottle went missing over the weekend during a party at her 72 year old daughter was throwing. She is requesting a refill today    Review Of Systems Outlined In HPI  Past Medical History  Diagnosis Date  . Asthma   . Depression   . Anxiety   . GERD (gastroesophageal reflux disease)   . Hypothyroidism   . Endometriosis   . History of sinus surgery 2010    MAXILLARY, ETHMOID, SPHENOID  . Obesity, Class III, BMI 40-49.9 (morbid obesity)   . SVD (spontaneous vaginal delivery)     x3  . Generalized headaches   . Adenomyosis 2013  . Kidney stones   . Complication of anesthesia     scoline pain? from use of Succinylcholine   . Anemia      Family History  Problem Relation Age of Onset  . Lung cancer  Father   . Alcohol abuse Father   . Colon cancer Paternal Grandmother   . Depression Mother   . Depression Sister   . Suicidality Brother   . Depression Brother   . Alcohol abuse Brother   . Drug abuse Brother   . Depression Sister   . Anesthesia problems Neg Hx      History  Substance Use Topics  . Smoking status: Never Smoker   . Smokeless tobacco: Never Used  . Alcohol Use: No     Objective: Filed Vitals:   03/23/12 1411  BP: 158/90  Pulse: 94  Temp: 97.7 F (36.5 C)    General: Alert and Oriented, No Acute Distress HEENT: Pupils equal, round, reactive to light. Conjunctivae clear. Right external ear canal with mild erythema and and edema and tender with speculum inserted, reproduces the patient's presenting pain otherwise canals are clear with intact TMs with appropriate landmarks.  Middle ear appears open without effusion. Pink inferior turbinates.  Moist mucous membranes, pharynx without inflammation nor lesions.  Neck supple without palpable lymphadenopathy nor abnormal masses.  Right maxillary and frontal sinus tenderness to percussion Lungs: Clear to auscultation bilaterally, no wheezing/ronchi/rales.  Comfortable work of breathing. Good air movement. Cardiac: Regular rate and rhythm. Normal S1/S2.  No murmurs, rubs, nor gallops.   Extremities: No peripheral edema.  Strong peripheral pulses.  Mental Status: No depression, anxiety, nor agitation. Skin: Warm and dry.  Assessment & Plan: [name] was seen today for right ear pain.  Diagnoses and associated orders for this visit:  Otitis externa - ciprofloxacin-hydrocortisone (CIPRO HC) otic suspension; Place 3 drops into the right ear 2 (two) times daily. - ketorolac (TORADOL) injection 60 mg; Inject 2 mLs (60 mg total) into the muscle once.  Sinusitis - amoxicillin-clavulanate (AUGMENTIN) 500-125 MG per tablet; Take one by mouth every 8 hours for ten total days.  Cough - HYDROcodone-homatropine (HYCODAN)  5-1.5 MG/5ML syrup; Take 5 mLs by mouth every 6 (six) hours as needed for cough.    Otitis externa: Start Cipro HC, she received a Toradol injection to help with pain now. Discussed anti-inflammatories to use at home Sinusitis: Discussed with her that this is most likely viral in etiology, if not improved by day 7 she may start Augmentin. Cough: Refill Hycodan, she reduced return to see Dr. Karie Schwalbe. for consideration of CT chest if not improved in 2 weeks.  Return if symptoms worsen or fail to improve.

## 2012-03-24 ENCOUNTER — Other Ambulatory Visit: Payer: Self-pay | Admitting: Sports Medicine

## 2012-03-31 ENCOUNTER — Encounter: Payer: Self-pay | Admitting: Sports Medicine

## 2012-03-31 ENCOUNTER — Ambulatory Visit: Payer: Self-pay

## 2012-03-31 ENCOUNTER — Ambulatory Visit (INDEPENDENT_AMBULATORY_CARE_PROVIDER_SITE_OTHER): Payer: 59 | Admitting: Sports Medicine

## 2012-03-31 VITALS — BP 162/104 | HR 94 | Temp 98.2°F | Wt 256.0 lb

## 2012-03-31 DIAGNOSIS — R059 Cough, unspecified: Secondary | ICD-10-CM

## 2012-03-31 DIAGNOSIS — D649 Anemia, unspecified: Secondary | ICD-10-CM

## 2012-03-31 DIAGNOSIS — E039 Hypothyroidism, unspecified: Secondary | ICD-10-CM

## 2012-03-31 DIAGNOSIS — R05 Cough: Secondary | ICD-10-CM

## 2012-03-31 LAB — COMPREHENSIVE METABOLIC PANEL WITH GFR
ALT: 18 U/L (ref 0–35)
BUN: 11 mg/dL (ref 6–23)
CO2: 25 meq/L (ref 19–32)
Calcium: 9.1 mg/dL (ref 8.4–10.5)
Chloride: 102 meq/L (ref 96–112)
Creat: 0.69 mg/dL (ref 0.50–1.10)
Total Bilirubin: 0.4 mg/dL (ref 0.3–1.2)

## 2012-03-31 LAB — FOLATE: Folate: 13.5 ng/mL

## 2012-03-31 LAB — CBC WITH DIFFERENTIAL/PLATELET
Basophils Absolute: 0.1 K/uL (ref 0.0–0.1)
Basophils Relative: 1 % (ref 0–1)
Eosinophils Absolute: 0.7 K/uL (ref 0.0–0.7)
Eosinophils Relative: 10 % — ABNORMAL HIGH (ref 0–5)
HCT: 29.3 % — ABNORMAL LOW (ref 36.0–46.0)
Hemoglobin: 9.2 g/dL — ABNORMAL LOW (ref 12.0–15.0)
Lymphocytes Relative: 25 % (ref 12–46)
Lymphs Abs: 1.7 K/uL (ref 0.7–4.0)
MCH: 23.5 pg — ABNORMAL LOW (ref 26.0–34.0)
MCHC: 31.4 g/dL (ref 30.0–36.0)
MCV: 74.7 fL — ABNORMAL LOW (ref 78.0–100.0)
Monocytes Absolute: 0.5 K/uL (ref 0.1–1.0)
Monocytes Relative: 8 % (ref 3–12)
Neutro Abs: 3.9 10*3/uL (ref 1.7–7.7)
Neutrophils Relative %: 56 % (ref 43–77)
Platelets: 338 K/uL (ref 150–400)
RBC: 3.92 MIL/uL (ref 3.87–5.11)
RDW: 16.5 % — ABNORMAL HIGH (ref 11.5–15.5)
WBC: 6.9 10*3/uL (ref 4.0–10.5)

## 2012-03-31 LAB — IRON AND TIBC
%SAT: 4 % — ABNORMAL LOW (ref 20–55)
Iron: 20 ug/dL — ABNORMAL LOW (ref 42–145)
TIBC: 468 ug/dL (ref 250–470)
UIBC: 448 ug/dL — ABNORMAL HIGH (ref 125–400)

## 2012-03-31 LAB — COMPREHENSIVE METABOLIC PANEL
AST: 16 U/L (ref 0–37)
Albumin: 4.6 g/dL (ref 3.5–5.2)
Alkaline Phosphatase: 80 U/L (ref 39–117)
Glucose, Bld: 118 mg/dL — ABNORMAL HIGH (ref 70–99)
Potassium: 4 mEq/L (ref 3.5–5.3)
Sodium: 138 mEq/L (ref 135–145)
Total Protein: 7.6 g/dL (ref 6.0–8.3)

## 2012-03-31 LAB — VITAMIN B12: Vitamin B-12: 298 pg/mL (ref 211–911)

## 2012-03-31 LAB — FERRITIN: Ferritin: 7 ng/mL — ABNORMAL LOW (ref 10–291)

## 2012-03-31 LAB — TSH: TSH: 14.132 u[IU]/mL — ABNORMAL HIGH (ref 0.350–4.500)

## 2012-03-31 MED ORDER — FERROUS GLUCONATE 325 (37.5 FE) MG PO TABS: 1.0000 | ORAL_TABLET | Freq: Three times a day (TID) | ORAL | Status: DC

## 2012-03-31 MED ORDER — HYDROCODONE-HOMATROPINE 5-1.5 MG/5ML PO SYRP: 5.0000 mL | ORAL_SOLUTION | Freq: Four times a day (QID) | ORAL | Status: DC | PRN

## 2012-03-31 NOTE — Progress Notes (Signed)
Subjective:    CC: Persistent cough  HPI:  Cough: [name] comes in, she's had multiple courses of antibiotics for otitis externa, bronchitis, sinus infections. Her cough has been persistent, dry, and been present for greater than 4 weeks now. Chest x-rays have been negative for infiltrate. 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 Unfortunately, she remains extremely fatigued, and her cough has been persistent.  Anemia: This is the likely cause of her fatigue, her last hemoglobin postsurgical 2 months ago was in the sevens. She has been on oral iron, but has not been taking this due to stomach upset.  Past medical history, Surgical history, Family history, Social history, Allergies, and medications have been entered into the medical record, reviewed, and no changes needed.   Review of Systems: No fevers, chills, night sweats, weight loss, chest pain, or shortness of breath.   Objective:    General: Well Developed, well nourished, and in no acute distress.  Neuro: Alert and oriented x3, extra-ocular muscles intact.  HEENT: Normocephalic, atraumatic, pupils equal round reactive to light, neck supple, no masses, no lymphadenopathy, thyroid nonpalpable.  Skin: Warm and dry, no rashes. Cardiac: Regular rate and rhythm, no murmurs rubs or gallops.  Respiratory: Clear to auscultation bilaterally. Not using accessory muscles, speaking in full sentences. Coughing in exam room.  Impression and Recommendations:

## 2012-03-31 NOTE — Assessment & Plan Note (Signed)
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 Considering her surgical history, and oncologic history I am going to pull the trigger for a CT scan with IV contrast of her chest.

## 2012-03-31 NOTE — Assessment & Plan Note (Addendum)
I do also suspect that her persistent fatigue is due to postsurgical anemia, she was 7.1 after surgery. She has been having trouble with iron supplementation. I think her current supplementation is insufficient. We're going to check a CBC, metabolic panel, TSH. I would also like some iron studies.  Is still anemic, but improved. Continue ferrous gluconate supplementation with vitamin C. Return to clinic for recheck in 4-6 weeks

## 2012-04-01 MED ORDER — LEVOTHYROXINE SODIUM 200 MCG PO TABS: 200.0000 ug | ORAL_TABLET | Freq: Every day | ORAL | Status: DC

## 2012-04-01 NOTE — Addendum Note (Signed)
Addended by: Monica Becton on: 04/01/2012 08:47 AM   Modules accepted: Orders

## 2012-04-01 NOTE — Assessment & Plan Note (Signed)
TSH elevated. Will increase levothyroxine, recheck in 4-6 weeks

## 2012-04-02 ENCOUNTER — Other Ambulatory Visit: Payer: Self-pay

## 2012-04-03 ENCOUNTER — Ambulatory Visit (INDEPENDENT_AMBULATORY_CARE_PROVIDER_SITE_OTHER): Payer: 59

## 2012-04-03 DIAGNOSIS — R05 Cough: Secondary | ICD-10-CM

## 2012-04-03 DIAGNOSIS — R0602 Shortness of breath: Secondary | ICD-10-CM

## 2012-04-03 MED ORDER — IOHEXOL 300 MG/ML  SOLN
75.0000 mL | Freq: Once | INTRAMUSCULAR | Status: AC | PRN
  Administered 2012-04-03: 75 mL via INTRAVENOUS

## 2012-04-04 ENCOUNTER — Encounter (HOSPITAL_BASED_OUTPATIENT_CLINIC_OR_DEPARTMENT_OTHER): Payer: Self-pay | Admitting: Emergency Medicine

## 2012-04-04 ENCOUNTER — Emergency Department (HOSPITAL_BASED_OUTPATIENT_CLINIC_OR_DEPARTMENT_OTHER)
Admission: EM | Admit: 2012-04-04 | Discharge: 2012-04-04 | Disposition: A | Discharge status: Home / Self Care | Payer: 59 | Attending: Emergency Medicine | Admitting: Emergency Medicine

## 2012-04-04 ENCOUNTER — Emergency Department (HOSPITAL_BASED_OUTPATIENT_CLINIC_OR_DEPARTMENT_OTHER): Payer: 59

## 2012-04-04 DIAGNOSIS — D649 Anemia, unspecified: Secondary | ICD-10-CM | POA: Insufficient documentation

## 2012-04-04 DIAGNOSIS — Y939 Activity, unspecified: Secondary | ICD-10-CM | POA: Insufficient documentation

## 2012-04-04 DIAGNOSIS — J45909 Unspecified asthma, uncomplicated: Secondary | ICD-10-CM | POA: Insufficient documentation

## 2012-04-04 DIAGNOSIS — F0781 Postconcussional syndrome: Secondary | ICD-10-CM | POA: Insufficient documentation

## 2012-04-04 DIAGNOSIS — R51 Headache: Secondary | ICD-10-CM | POA: Insufficient documentation

## 2012-04-04 DIAGNOSIS — N8000 Endometriosis of the uterus, unspecified: Secondary | ICD-10-CM | POA: Insufficient documentation

## 2012-04-04 DIAGNOSIS — Y92009 Unspecified place in unspecified non-institutional (private) residence as the place of occurrence of the external cause: Secondary | ICD-10-CM | POA: Insufficient documentation

## 2012-04-04 DIAGNOSIS — Z801 Family history of malignant neoplasm of trachea, bronchus and lung: Secondary | ICD-10-CM | POA: Insufficient documentation

## 2012-04-04 DIAGNOSIS — Z860100 Personal history of colon polyps, unspecified: Secondary | ICD-10-CM | POA: Insufficient documentation

## 2012-04-04 DIAGNOSIS — R112 Nausea with vomiting, unspecified: Secondary | ICD-10-CM | POA: Insufficient documentation

## 2012-04-04 DIAGNOSIS — Z859 Personal history of malignant neoplasm, unspecified: Secondary | ICD-10-CM | POA: Insufficient documentation

## 2012-04-04 DIAGNOSIS — Z8719 Personal history of other diseases of the digestive system: Secondary | ICD-10-CM | POA: Insufficient documentation

## 2012-04-04 DIAGNOSIS — Z6379 Other stressful life events affecting family and household: Secondary | ICD-10-CM | POA: Insufficient documentation

## 2012-04-04 DIAGNOSIS — W1809XA Striking against other object with subsequent fall, initial encounter: Secondary | ICD-10-CM | POA: Insufficient documentation

## 2012-04-04 DIAGNOSIS — J329 Chronic sinusitis, unspecified: Secondary | ICD-10-CM | POA: Insufficient documentation

## 2012-04-04 DIAGNOSIS — Z8659 Personal history of other mental and behavioral disorders: Secondary | ICD-10-CM | POA: Insufficient documentation

## 2012-04-04 DIAGNOSIS — Z79899 Other long term (current) drug therapy: Secondary | ICD-10-CM | POA: Insufficient documentation

## 2012-04-04 DIAGNOSIS — Y999 Unspecified external cause status: Secondary | ICD-10-CM | POA: Insufficient documentation

## 2012-04-04 DIAGNOSIS — Z90710 Acquired absence of both cervix and uterus: Secondary | ICD-10-CM | POA: Insufficient documentation

## 2012-04-04 DIAGNOSIS — E039 Hypothyroidism, unspecified: Secondary | ICD-10-CM | POA: Insufficient documentation

## 2012-04-04 MED ORDER — HYDROCODONE-ACETAMINOPHEN 5-325 MG PO TABS
2.0000 | ORAL_TABLET | Freq: Once | ORAL | Status: AC
  Administered 2012-04-04: 2 via ORAL
  Filled 2012-04-04: qty 2

## 2012-04-04 MED ORDER — ONDANSETRON 8 MG PO TBDP
8.0000 mg | ORAL_TABLET | Freq: Once | ORAL | Status: AC
  Administered 2012-04-04: 8 mg via ORAL
  Filled 2012-04-04: qty 1

## 2012-04-04 MED ORDER — ONDANSETRON 8 MG PO TBDP: 8.0000 mg | ORAL_TABLET | Freq: Three times a day (TID) | ORAL | Status: DC | PRN

## 2012-04-04 MED ORDER — HYDROCODONE-ACETAMINOPHEN 5-325 MG PO TABS: 1.0000 | ORAL_TABLET | ORAL | Status: DC | PRN

## 2012-04-04 NOTE — ED Notes (Signed)
The patient is undressed and in a gown. The bed is locked and in the lowest position, and the call light is within reach. Family is at the bedside, a warm blanket has been given.

## 2012-04-04 NOTE — ED Notes (Signed)
Pt reports she fell while bending over and truck forehead, no LOC, vomited X2, c/o HA, PERRL, gross neuro intact, NAD

## 2012-04-04 NOTE — ED Provider Notes (Signed)
History     CSN: [id number]  Arrival date & time 04/04/12  4098   First MD Initiated Contact with Patient 04/04/12 [phone]      Chief Complaint  Patient presents with  . Fall    (Consider location/radiation/quality/duration/timing/severity/associated sxs/prior treatment) HPI Patient bent over and struck her forehead on a sink at home at 4 AM today. Since the event she has vomited twice and has severe diffuse headache. She did not suffer loss of consciousness. No treatment prior to coming here nothing makes symptoms better or worse no other associated symptoms she presently complains of headache and nausea moderate to severe, sharp in quality. No other complaint Past Medical History  Diagnosis Date  . Asthma   . Depression   . Anxiety   . GERD (gastroesophageal reflux disease)   . Hypothyroidism   . Endometriosis   . History of sinus surgery 2010    MAXILLARY, ETHMOID, SPHENOID  . Obesity, Class III, BMI 40-49.9 (morbid obesity)   . SVD (spontaneous vaginal delivery)     x3  . Generalized headaches   . Adenomyosis 2013  . Kidney stones   . Complication of anesthesia     scoline pain? from use of Succinylcholine   . Anemia    cancer of the appendix  Past Surgical History  Procedure Date  . Knee surgery     right, scope and ACL repair  . Nasal sinus surgery 2010  . Colposcopy 2007  . Laparoscopy 05/30/2011    Procedure: LAPAROSCOPY OPERATIVE;  Surgeon: Juluis Mire, MD;  Location: [facility];  Service: Gynecology;  Laterality: N/A;  YAG  LASER of Endometriosis.  . Laparoscopic assisted vaginal hysterectomy 11/13/2011    Procedure: LAPAROSCOPIC ASSISTED VAGINAL HYSTERECTOMY;  Surgeon: Juluis Mire, MD;  Location: [facility];  Service: Gynecology;  Laterality: N/A;  . Abdominal hysterectomy   . Laparoscopic appendectomy 01/02/2012    Procedure: APPENDECTOMY LAPAROSCOPIC;  Surgeon: Shelly Rubenstein, MD;  Location: [facility];  Service: General;  Laterality: N/A;  . Appendectomy   .  Partial colectomy 01/27/2012  . Laparoscopic cholecystectomy 01/27/2012  . Cholecystectomy 01/27/2012    Procedure: LAPAROSCOPIC CHOLECYSTECTOMY;  Surgeon: Shelly Rubenstein, MD;  Location: [facility];  Service: General;  Laterality: N/A;  Laparoscopic chole    Family History  Problem Relation Age of Onset  . Lung cancer Father   . Alcohol abuse Father   . Colon cancer Paternal Grandmother   . Depression Mother   . Depression Sister   . Suicidality Brother   . Depression Brother   . Alcohol abuse Brother   . Drug abuse Brother   . Depression Sister   . Anesthesia problems Neg Hx     History  Substance Use Topics  . Smoking status: Never Smoker   . Smokeless tobacco: Never Used  . Alcohol Use: No    OB History    Grav Para Term Preterm Abortions TAB SAB Ect Mult Living   3 3 3       3       Review of Systems  Constitutional: Negative.   Respiratory: Negative.   Cardiovascular: Negative.   Gastrointestinal: Positive for nausea and vomiting.  Musculoskeletal: Negative.   Skin: Negative.   Neurological: Positive for headaches.  Hematological: Negative.   Psychiatric/Behavioral: Negative.   All other systems reviewed and are negative.    Allergies  Ciprofloxacin; Clarithromycin; Moxifloxacin; and Reglan  Home Medications   Current Outpatient Rx  Name  Route  Sig  Dispense  Refill  . ALBUTEROL SULFATE HFA 108 (90 BASE) MCG/ACT IN AERS   Inhalation   Inhale 2 puffs into the lungs every 4 (four) hours as needed for wheezing (cough, shortness of breath or wheezing.).   1 Inhaler   1   . CLONAZEPAM 0.5 MG PO TABS   Oral   Take 1 tablet (0.5 mg total) by mouth 2 (two) times daily as needed for anxiety.   30 tablet   0     Fill on or after 03/02/2012.   Marland Kitchen CYANOCOBALAMIN 1000 MCG/ML IJ SOLN      INJECT 1 ML (1,000 MCG TOTAL) INTO THE MUSCLE EVERY 30 (THIRTY) DAYS.   1 mL   3   . ESOMEPRAZOLE MAGNESIUM 40 MG PO CPDR   Oral   Take 1 capsule (40 mg total) by  mouth daily.   90 capsule   3   . INTEGRA PLUS PO CAPS   Oral   Take 1 capsule by mouth daily.   30 capsule   4     Samples of this drug were given to the patient, qu ...   . FERROUS GLUCONATE 325 (37.5 FE) MG PO TABS   Oral   Take 1 tablet by mouth 3 (three) times daily with meals.   90 tablet   3   . FLUTICASONE PROPIONATE 50 MCG/ACT NA SUSP      One spray in each nostril twice a day, use left hand for right nostril, and right hand for left nostril.   48 g   3   . FLUTICASONE-SALMETEROL 250-50 MCG/DOSE IN AEPB   Inhalation   Inhale 1 puff into the lungs daily.         Marland Kitchen HYDROCODONE-HOMATROPINE 5-1.5 MG/5ML PO SYRP   Oral   Take 5 mLs by mouth every 6 (six) hours as needed for cough.   180 mL   0   . LEVOTHYROXINE SODIUM 200 MCG PO TABS   Oral   Take 1 tablet (200 mcg total) by mouth daily.   90 tablet   1   . NEEDLE (DISP) 23G X 1" MISC      Use weekly for B12 injections. Quantity sufficient for 3 months.   1 each   11   . ONDANSETRON HCL 4 MG PO TABS      1-2 tablets every 8 hours only as needed for nausea.   30 tablet   1   . SERTRALINE HCL 100 MG PO TABS   Oral   Take 2 tablets (200 mg total) by mouth daily.   60 tablet   1   . SYRINGE (DISPOSABLE) 1 ML MISC      Use weekly for B12 injections. Quantity sufficient for 3 months.   1 each   0     BP 143/91  Pulse 85  Temp 98.2 F (36.8 C) (Oral)  Resp 18  SpO2 100%  LMP 11/02/2011  Physical Exam  Nursing note and vitals reviewed. Constitutional: She appears well-developed and well-nourished.  HENT:  Head: Normocephalic and atraumatic.  Eyes: Conjunctivae normal are normal. Pupils are equal, round, and reactive to light.  Neck: Neck supple. No tracheal deviation present. No thyromegaly present.  Cardiovascular: Normal rate and regular rhythm.   No murmur heard. Pulmonary/Chest: Effort normal and breath sounds normal.  Abdominal: Soft. Bowel sounds are normal. She exhibits no  distension. There is no tenderness.       Obese  Musculoskeletal: Normal range of  motion. She exhibits no edema and no tenderness.  Neurological: She is alert. She has normal reflexes. Coordination normal.       Gait normal Romberg normal pronator drift normal  Skin: Skin is warm and dry. No rash noted.  Psychiatric: She has a normal mood and affect.    ED Course  Procedures (including critical care time)  Labs Reviewed - No data to display Ct Chest W Contrast  04/03/2012  *RADIOLOGY REPORT*  Clinical Data: 4 weeks of persistent cough.  Shortness of breath. History of mucinous appendiceal neoplasm.  CT CHEST WITH CONTRAST  Technique:  Multidetector CT imaging of the chest was performed following the standard protocol during bolus administration of intravenous contrast.  Contrast: 75mL OMNIPAQUE IOHEXOL 300 MG/ML  SOLN  Comparison: No priors.  Findings:  Mediastinum: Heart size is normal. There is no significant pericardial fluid, thickening or pericardial calcification. No pathologically enlarged mediastinal or hilar lymph nodes. Esophagus is unremarkable in appearance.  Lungs/Pleura: No acute consolidative airspace disease.  No pleural effusions.  No suspicious appearing pulmonary nodules or masses are identified.  The large airways are old grossly patent, and are unremarkable in appearance.  Upper Abdomen: Status post cholecystectomy.  Musculoskeletal: There are no aggressive appearing lytic or blastic lesions noted in the visualized portions of the skeleton.  IMPRESSION: 1.  No acute findings in the thorax to account for the patient's symptoms.  Specifically, no signs of acute pulmonary infection, and no evidence of pulmonary metastases.   Original Report Authenticated By: Trudie Reed, M.D.      No diagnosis found. CT scan report reviewed by me. Patient reports she's had chronic sinus issues and has had sinus surgery 11:30 AM feels much improved after treatment with Norco and Zofran.  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

## 2012-04-06 ENCOUNTER — Ambulatory Visit (INDEPENDENT_AMBULATORY_CARE_PROVIDER_SITE_OTHER): Payer: 59 | Admitting: Sports Medicine

## 2012-04-06 ENCOUNTER — Encounter: Payer: Self-pay | Admitting: Sports Medicine

## 2012-04-06 VITALS — BP 150/95 | HR 102 | Wt 254.0 lb

## 2012-04-06 DIAGNOSIS — Z0289 Encounter for other administrative examinations: Secondary | ICD-10-CM

## 2012-04-06 DIAGNOSIS — E039 Hypothyroidism, unspecified: Secondary | ICD-10-CM

## 2012-04-06 DIAGNOSIS — S060XAA Concussion with loss of consciousness status unknown, initial encounter: Secondary | ICD-10-CM

## 2012-04-06 DIAGNOSIS — R05 Cough: Secondary | ICD-10-CM

## 2012-04-06 DIAGNOSIS — R059 Cough, unspecified: Secondary | ICD-10-CM

## 2012-04-06 DIAGNOSIS — S060X9A Concussion with loss of consciousness of unspecified duration, initial encounter: Secondary | ICD-10-CM | POA: Insufficient documentation

## 2012-04-06 DIAGNOSIS — D649 Anemia, unspecified: Secondary | ICD-10-CM

## 2012-04-06 MED ORDER — HYDROCODONE-ACETAMINOPHEN 7.5-325 MG PO TABS: 1.0000 | ORAL_TABLET | Freq: Three times a day (TID) | ORAL | Status: DC | PRN

## 2012-04-06 NOTE — Assessment & Plan Note (Signed)
Continue iron supplementation. Recheck in about 5 weeks when we recheck her hypothyroidism.

## 2012-04-06 NOTE — Progress Notes (Signed)
Subjective:    CC: Followup  HPI: Hypothyroidism: Recently increased to 200 mcg of Synthroid.  Iron deficiency anemia: Ulcer related to acute blood loss from her recent surgery, as well as borderline B12 deficiency. She's currently doing iron gluconate 3 times a day, she was unable to tolerate iron sulfate.  Concussion: Hit her head recently on the sink, had some nausea and vomiting, and now has difficulty concentrating. She did have a CT scan of her head in the emergency department which was negative.  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 This was negative, and her cough is overall improving.  Past medical history, Surgical history, Family history, Social history, Allergies, and medications have been entered into the medical record, reviewed, and no changes needed.   Review of Systems: No fevers, chills, night sweats, weight loss, chest pain, or shortness of breath.   Objective:    General: Well Developed, well nourished, and in no acute distress.  Neuro: Alert and oriented x3, extra-ocular muscles intact.  HEENT: Normocephalic, atraumatic, pupils equal round reactive to light, neck supple, no masses, no lymphadenopathy, thyroid nonpalpable.  Skin: Warm and dry, no rashes. Cardiac: Regular rate and rhythm, no murmurs rubs or gallops.  Respiratory: Clear to auscultation bilaterally. Not using accessory muscles, speaking in full sentences.  Impression and Recommendations:

## 2012-04-06 NOTE — Assessment & Plan Note (Signed)
Continue cough medicine as needed. CT scan of the chest was negative. I suspect that this will improve as we normalized her CBC, and hypothyroidism.

## 2012-04-06 NOTE — Progress Notes (Signed)
Disability forms filled out. [name]. [name], M.D.

## 2012-04-06 NOTE — Assessment & Plan Note (Signed)
Recheck in about 4-5 weeks. Can increase levothyroxine if needed.

## 2012-04-06 NOTE — Assessment & Plan Note (Signed)
Hydrocodone 7.5/325. Cognitive rest as much as we can realistically. We will follow this up symptomatically.

## 2012-04-24 ENCOUNTER — Ambulatory Visit (INDEPENDENT_AMBULATORY_CARE_PROVIDER_SITE_OTHER): Payer: 59 | Admitting: Sports Medicine

## 2012-04-24 ENCOUNTER — Encounter: Payer: Self-pay | Admitting: Sports Medicine

## 2012-04-24 VITALS — BP 157/102 | HR 92 | Wt 253.0 lb

## 2012-04-24 DIAGNOSIS — F4001 Agoraphobia with panic disorder: Secondary | ICD-10-CM

## 2012-04-24 MED ORDER — LORAZEPAM 0.5 MG PO TABS
0.5000 mg | ORAL_TABLET | Freq: Two times a day (BID) | ORAL | Status: DC | PRN
Start: 2012-04-24 — End: 2012-05-11

## 2012-04-24 NOTE — Progress Notes (Signed)
Subjective:    CC: Followup  HPI: Anxiety: Haru has a history of stable anxiety and depression. She has been on sertraline 100 mg daily. She was seen by her psychiatrist in the past and added Cymbalta 30 mg daily. She's also been on Ativan for acute anxiety. She has stopped seeing Dr. Laury Deep downstairs, and is also recently stopped seeing her other counts are as they discharge her for being on hydrocodone. They were unaware of her recent cancer and hemicolectomy. She does desire a short course of Ativan help her anxiety, and also desires to do a taper. Anxiety is severe, makes it difficult to do her activities of daily living, she denies suicidal or homicidal ideation.  Past medical history, Surgical history, Family history, Social history, Allergies, and medications have been entered into the medical record, reviewed, and no changes needed.   Review of Systems: No fevers, chills, night sweats, weight loss, chest pain, or shortness of breath.   Objective:    General: Well Developed, well nourished, and in no acute distress.  Neuro: Alert and oriented x3, extra-ocular muscles intact, sensation grossly intact.  HEENT: Normocephalic, atraumatic, pupils equal round reactive to light, neck supple, no masses, no lymphadenopathy, thyroid nonpalpable.  Skin: Warm and dry, no rashes. Cardiac: Regular rate and rhythm, no murmurs rubs or gallops.  Respiratory: Clear to auscultation bilaterally. Not using accessory muscles, speaking in full sentences.   Impression and Recommendations:

## 2012-04-24 NOTE — Assessment & Plan Note (Signed)
Starting Ativan 0.5mg  for use up to twice a day for anxiety. We will also start a down taper we will do the above dosing for one month, dropping down to 0.25 mg twice a day for a month, then 0.25 mg daily for a month then stop. Continue Cymbalta.

## 2012-04-25 ENCOUNTER — Encounter: Payer: Self-pay | Admitting: *Deleted

## 2012-04-25 ENCOUNTER — Emergency Department
Admission: EM | Admit: 2012-04-25 | Discharge: 2012-04-25 | Disposition: A | Discharge status: Home / Self Care | Payer: 59 | Source: Home / Self Care | Attending: Family Medicine | Admitting: Family Medicine

## 2012-04-25 DIAGNOSIS — J111 Influenza due to unidentified influenza virus with other respiratory manifestations: Secondary | ICD-10-CM

## 2012-04-25 DIAGNOSIS — J069 Acute upper respiratory infection, unspecified: Secondary | ICD-10-CM

## 2012-04-25 DIAGNOSIS — R6889 Other general symptoms and signs: Secondary | ICD-10-CM

## 2012-04-25 DIAGNOSIS — R062 Wheezing: Secondary | ICD-10-CM

## 2012-04-25 LAB — POCT INFLUENZA A/B
Influenza A, POC: NEGATIVE
Influenza B, POC: NEGATIVE

## 2012-04-25 LAB — POCT RAPID STREP A (OFFICE): Rapid Strep A Screen: NEGATIVE

## 2012-04-25 MED ORDER — HYDROCOD POLST-CHLORPHEN POLST 10-8 MG/5ML PO LQCR: 5.0000 mL | Freq: Two times a day (BID) | ORAL | Status: DC | PRN

## 2012-04-25 MED ORDER — AZITHROMYCIN 250 MG PO TABS: ORAL_TABLET | ORAL | Status: DC

## 2012-04-25 MED ORDER — PREDNISONE 50 MG PO TABS: ORAL_TABLET | ORAL | Status: DC

## 2012-04-25 MED ORDER — HYDROCODONE-HOMATROPINE 5-1.5 MG/5ML PO SYRP: 5.0000 mL | ORAL_SOLUTION | Freq: Four times a day (QID) | ORAL | Status: DC | PRN

## 2012-04-25 NOTE — ED Notes (Addendum)
Pt c/o fever, chills, headache, sore throat since Thursday. Has tried OTC Advil, Nyquil, Delsym with no help.

## 2012-04-25 NOTE — ED Provider Notes (Signed)
History     CSN: [id number]  Arrival date & time 04/25/12  1104   First MD Initiated Contact with Patient 04/25/12 1151      Chief Complaint  Patient presents with  . Sore Throat  . Cough  . Fever  . Chills  HPI URI Symptoms Onset: 3-4 days  Description: rhinorrhea, nasal congestion, cough, headache, fevers, chills, sore throat Modifying factors:  Baseline hx/o asthma. Has had increased wheezing.   Symptoms Nasal discharge: yes Fever: yes Sore throat: yes Cough: yes Wheezing: yes Ear pain: no GI symptoms: no Sick contacts: yes  Red Flags  Stiff neck: no Dyspnea: mild Rash: no Swallowing difficulty: no  Sinusitis Risk Factors Headache/face pain: yes Double sickening: no tooth pain: no  Allergy Risk Factors Sneezing: no Itchy scratchy throat: no Seasonal symptoms: no  Flu Risk Factors Headache: yes muscle aches: mild severe fatigue: mild    Past Medical History  Diagnosis Date  . Asthma   . Depression   . Anxiety   . GERD (gastroesophageal reflux disease)   . Hypothyroidism   . Endometriosis   . History of sinus surgery 2010    MAXILLARY, ETHMOID, SPHENOID  . Obesity, Class III, BMI 40-49.9 (morbid obesity)   . SVD (spontaneous vaginal delivery)     x3  . Generalized headaches   . Adenomyosis 2013  . Kidney stones   . Complication of anesthesia     scoline pain? from use of Succinylcholine   . Anemia     Past Surgical History  Procedure Date  . Knee surgery     right, scope and ACL repair  . Nasal sinus surgery 2010  . Colposcopy 2007  . Laparoscopy 05/30/2011    Procedure: LAPAROSCOPY OPERATIVE;  Surgeon: Juluis Mire, MD;  Location: [facility];  Service: Gynecology;  Laterality: N/A;  YAG  LASER of Endometriosis.  . Laparoscopic assisted vaginal hysterectomy 11/13/2011    Procedure: LAPAROSCOPIC ASSISTED VAGINAL HYSTERECTOMY;  Surgeon: Juluis Mire, MD;  Location: [facility];  Service: Gynecology;  Laterality: N/A;  . Abdominal hysterectomy     . Laparoscopic appendectomy 01/02/2012    Procedure: APPENDECTOMY LAPAROSCOPIC;  Surgeon: Shelly Rubenstein, MD;  Location: [facility];  Service: General;  Laterality: N/A;  . Appendectomy   . Partial colectomy 01/27/2012  . Laparoscopic cholecystectomy 01/27/2012  . Cholecystectomy 01/27/2012    Procedure: LAPAROSCOPIC CHOLECYSTECTOMY;  Surgeon: Shelly Rubenstein, MD;  Location: [facility];  Service: General;  Laterality: N/A;  Laparoscopic chole    Family History  Problem Relation Age of Onset  . Lung cancer Father   . Alcohol abuse Father   . Colon cancer Paternal Grandmother   . Depression Mother   . Depression Sister   . Suicidality Brother   . Depression Brother   . Alcohol abuse Brother   . Drug abuse Brother   . Depression Sister   . Anesthesia problems Neg Hx     History  Substance Use Topics  . Smoking status: Never Smoker   . Smokeless tobacco: Never Used  . Alcohol Use: No    OB History    Grav Para Term Preterm Abortions TAB SAB Ect Mult Living   3 3 3       3       Review of Systems  All other systems reviewed and are negative.    Allergies  Ciprofloxacin; Clarithromycin; Moxifloxacin; and Reglan  Home Medications   Current Outpatient Rx  Name  Route  Sig  Dispense  Refill  . ALBUTEROL SULFATE HFA 108 (90 BASE) MCG/ACT IN AERS   Inhalation   Inhale 2 puffs into the lungs every 4 (four) hours as needed for wheezing (cough, shortness of breath or wheezing.).   1 Inhaler   1   . CYANOCOBALAMIN 1000 MCG/ML IJ SOLN      INJECT 1 ML (1,000 MCG TOTAL) INTO THE MUSCLE EVERY 30 (THIRTY) DAYS.   1 mL   3   . DULOXETINE HCL 30 MG PO CPEP   Oral   Take 1 capsule (30 mg total) by mouth daily.   90 capsule   0   . ESOMEPRAZOLE MAGNESIUM 40 MG PO CPDR   Oral   Take 1 capsule (40 mg total) by mouth daily.   90 capsule   3   . INTEGRA PLUS PO CAPS   Oral   Take 1 capsule by mouth daily.   30 capsule   4     Samples of this drug were given to the  patient, qu ...   . FERROUS GLUCONATE 325 (37.5 FE) MG PO TABS   Oral   Take 1 tablet by mouth 3 (three) times daily with meals.   90 tablet   3   . FLUTICASONE PROPIONATE 50 MCG/ACT NA SUSP      One spray in each nostril twice a day, use left hand for right nostril, and right hand for left nostril.   48 g   3   . FLUTICASONE-SALMETEROL 250-50 MCG/DOSE IN AEPB   Inhalation   Inhale 1 puff into the lungs daily.         Marland Kitchen LEVOTHYROXINE SODIUM 200 MCG PO TABS   Oral   Take 1 tablet (200 mcg total) by mouth daily.   90 tablet   1   . LORAZEPAM 0.5 MG PO TABS   Oral   Take 1 tablet (0.5 mg total) by mouth 2 (two) times daily as needed for anxiety.   60 tablet   0   . NEEDLE (DISP) 23G X 1" MISC      Use weekly for B12 injections. Quantity sufficient for 3 months.   1 each   11   . SERTRALINE HCL 100 MG PO TABS   Oral   Take 2 tablets (200 mg total) by mouth daily.   60 tablet   1   . SYRINGE (DISPOSABLE) 1 ML MISC      Use weekly for B12 injections. Quantity sufficient for 3 months.   1 each   0     BP 143/85  Pulse 87  Temp 98.4 F (36.9 C) (Oral)  Resp 16  Ht 5\' 4"  (1.626 m)  Wt 251 lb 4 oz (113.966 kg)  BMI 43.13 kg/m2  SpO2 98%  LMP 11/02/2011  Physical Exam  Constitutional: She appears well-developed.       Obese    HENT:  Head: Normocephalic and atraumatic.  Right Ear: External ear normal.       +nasal erythema, rhinorrhea bilaterally, + post oropharyngeal erythema    Eyes: Conjunctivae normal are normal. Pupils are equal, round, and reactive to light.  Neck: Normal range of motion. Neck supple.  Cardiovascular: Normal rate, regular rhythm and normal heart sounds.   Pulmonary/Chest: Effort normal. She has wheezes.  Abdominal: Soft. Bowel sounds are normal.  Musculoskeletal: Normal range of motion.  Neurological: She is alert.  Skin: Skin is warm.    ED Course  Procedures (including critical care  time)   Labs Reviewed  POCT  INFLUENZA A/B  POCT RAPID STREP A (OFFICE)   No results found.   1. URI (upper respiratory infection)   2. Flu-like symptoms   3. Wheezing       MDM  Likely viral illness with secondary obstructive lung disease flare.  Rapid strep and rapid flu negative.  Solumedrol 125 mg IM x1 Prednisone x 7 days.  Zpak for atypical coverage.  Discussed general care and infectious/resp red flags at length.  Follow up with PCP in 5-7 days if not improved.      The patient and/or caregiver has been counseled thoroughly with regard to treatment plan and/or medications prescribed including dosage, schedule, interactions, rationale for use, and possible side effects and they verbalize understanding. Diagnoses and expected course of recovery discussed and will return if not improved as expected or if the condition worsens. Patient and/or caregiver verbalized understanding.             Doree Albee, MD 04/25/12 [id number]

## 2012-04-30 ENCOUNTER — Encounter: Payer: Self-pay | Admitting: Sports Medicine

## 2012-04-30 ENCOUNTER — Ambulatory Visit (INDEPENDENT_AMBULATORY_CARE_PROVIDER_SITE_OTHER): Payer: 59 | Admitting: Sports Medicine

## 2012-04-30 VITALS — BP 151/95 | HR 102 | Temp 98.0°F | Wt 253.0 lb

## 2012-04-30 DIAGNOSIS — R05 Cough: Secondary | ICD-10-CM

## 2012-04-30 DIAGNOSIS — D373 Neoplasm of uncertain behavior of appendix: Secondary | ICD-10-CM

## 2012-04-30 DIAGNOSIS — D371 Neoplasm of uncertain behavior of stomach: Secondary | ICD-10-CM

## 2012-04-30 DIAGNOSIS — R059 Cough, unspecified: Secondary | ICD-10-CM

## 2012-04-30 MED ORDER — HYDROCODONE-HOMATROPINE 5-1.5 MG/5ML PO SYRP: 5.0000 mL | ORAL_SOLUTION | Freq: Four times a day (QID) | ORAL | Status: DC | PRN

## 2012-04-30 NOTE — Progress Notes (Signed)
Subjective:    CC: Followup  HPI: [name] continues to have multiple episodes of URIs. 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 Unfortunately she had significant post surgical anemia, and has had multiple URIs. She was overall doing well, however over the past few days she's noted an increasing cough that is nonproductive and dry.  Previously this is responded well to the tincture of time and Hycodan syrup. She denies any night sweats, fevers, chills, weight loss. She denies any shortness of breath. She is a never smoker.  Symptoms are moderate.  Loose stools: [name] is noted this since her hemicolectomy and cholecystectomy. She does not eat fatty foods. She does note that while previously on Hycodan syrup as well as on Lomotil, her symptoms were significantly improved.  Past medical history, Surgical history, Family history not pertinant except as noted below, Social history, Allergies, and medications have been entered into the medical record, reviewed, and no changes needed.   Review of Systems: No fevers, chills, night sweats, weight loss, chest pain, or shortness of breath.   Objective:    General: Well Developed, well nourished, and in no acute distress.  Neuro: Alert and oriented x3, extra-ocular muscles intact, sensation grossly intact.  HEENT: Normocephalic, atraumatic, pupils equal round reactive to light, neck supple, no masses, no lymphadenopathy, thyroid nonpalpable.  Skin: Warm and dry, no rashes. Cardiac: Regular rate and rhythm, no murmurs rubs or gallops.  Respiratory: Clear to auscultation bilaterally. Not using accessory muscles, speaking in full sentences.  Impression and Recommendations:

## 2012-04-30 NOTE — Assessment & Plan Note (Signed)
Got better, unfortunately has developed a cough again. She does have a recent negative CT chest. I will refill her Hycodan, and we will follow her up at the end of this month. If still persistent at that point we need to consider referral to pulmonology for consideration of endoscopy/bronchoscopy.

## 2012-04-30 NOTE — Assessment & Plan Note (Signed)
With continued loose stools, she is also status post cholecystectomy. Symptoms were significantly improved while taking the Hycodan cough syrup. I do suspect that we need to slow her motility, she will use Lomotil that she has at home. If not improved at the followup visit, I would like her to talk to Dr. Magnus Ivan regarding other ways to improve her loose stools status post cholecystectomy and hemicolectomy.

## 2012-05-11 ENCOUNTER — Ambulatory Visit (INDEPENDENT_AMBULATORY_CARE_PROVIDER_SITE_OTHER): Payer: 59 | Admitting: Sports Medicine

## 2012-05-11 ENCOUNTER — Encounter: Payer: Self-pay | Admitting: Sports Medicine

## 2012-05-11 ENCOUNTER — Other Ambulatory Visit: Payer: Self-pay | Admitting: Sports Medicine

## 2012-05-11 VITALS — BP 132/93 | HR 92 | Wt 251.0 lb

## 2012-05-11 DIAGNOSIS — E039 Hypothyroidism, unspecified: Secondary | ICD-10-CM

## 2012-05-11 DIAGNOSIS — F4001 Agoraphobia with panic disorder: Secondary | ICD-10-CM

## 2012-05-11 DIAGNOSIS — J45909 Unspecified asthma, uncomplicated: Secondary | ICD-10-CM

## 2012-05-11 DIAGNOSIS — F32A Depression, unspecified: Secondary | ICD-10-CM

## 2012-05-11 DIAGNOSIS — D649 Anemia, unspecified: Secondary | ICD-10-CM

## 2012-05-11 DIAGNOSIS — F329 Major depressive disorder, single episode, unspecified: Secondary | ICD-10-CM

## 2012-05-11 DIAGNOSIS — F3289 Other specified depressive episodes: Secondary | ICD-10-CM

## 2012-05-11 MED ORDER — DULOXETINE HCL 30 MG PO CPEP: 30.0000 mg | ORAL_CAPSULE | Freq: Every day | ORAL | Status: DC

## 2012-05-11 MED ORDER — ALBUTEROL SULFATE HFA 108 (90 BASE) MCG/ACT IN AERS: 2.0000 | INHALATION_SPRAY | RESPIRATORY_TRACT | Status: DC | PRN

## 2012-05-11 MED ORDER — LORAZEPAM 0.5 MG PO TABS: 0.5000 mg | ORAL_TABLET | Freq: Two times a day (BID) | ORAL | Status: DC | PRN

## 2012-05-11 NOTE — Assessment & Plan Note (Addendum)
Refilling rescue inhaler. The cough has now been persistent for 3 months despite multiple courses of antibiotics, multiple imaging studies. I would like her to see a pulmonologist, I do suspect that we may need to increase her Advair dose, I would certainly like their input.

## 2012-05-11 NOTE — Assessment & Plan Note (Signed)
Due for recheck of TSH

## 2012-05-11 NOTE — Progress Notes (Signed)
Subjective:    CC: Followup  HPI: Cough: Has been present for approximately 3 months now. She has been through multiple courses of antibiotics, antitussives, oral narcotics, CT scan of the chest was negative.  She does have asthma, but has been using her ICS/LABA.  She also uses her albuterol on occasion. Unfortunately her symptoms remained persistent.  She is status post hemicolectomy for appendiceal mucinous neoplasm.  Fatigue: Does have concurrent depression, hypothyroidism, and anemia.  Anemia: Postsurgical, improving with iron supplementation.  Hypothyroidism: Most recent TSH was 14, we increased her Synthroid to 200 mcg, and she is due for a recheck today.  Depression/anxiety: Needs refills on Cymbalta, Xanax. She is doing well with her sertraline.  Past medical history, Surgical history, Family history not pertinant except as noted below, Social history, Allergies, and medications have been entered into the medical record, reviewed, and no changes needed.   Review of Systems: No fevers, chills, night sweats, weight loss, chest pain, or shortness of breath.   Objective:    General: Well Developed, well nourished, and in no acute distress.  Neuro: Alert and oriented x3, extra-ocular muscles intact, sensation grossly intact.  HEENT: Normocephalic, atraumatic, pupils equal round reactive to light, neck supple, no masses, no lymphadenopathy, thyroid nonpalpable.  Skin: Warm and dry, no rashes. Cardiac: Regular rate and rhythm, no murmurs rubs or gallops.  Respiratory: Clear to auscultation bilaterally. Not using accessory muscles, speaking in full sentences.  Impression and Recommendations:

## 2012-05-11 NOTE — Assessment & Plan Note (Addendum)
Rechecking CBC. Continue iron supplementation.

## 2012-05-11 NOTE — Assessment & Plan Note (Signed)
Refilling Cymbalta, Ativan.

## 2012-05-12 ENCOUNTER — Other Ambulatory Visit: Payer: Self-pay | Admitting: Sports Medicine

## 2012-05-12 DIAGNOSIS — E039 Hypothyroidism, unspecified: Secondary | ICD-10-CM

## 2012-05-12 LAB — CBC
HCT: 31.7 % — ABNORMAL LOW (ref 36.0–46.0)
Hemoglobin: 9.9 g/dL — ABNORMAL LOW (ref 12.0–15.0)
MCH: 22.9 pg — ABNORMAL LOW (ref 26.0–34.0)
MCHC: 31.2 g/dL (ref 30.0–36.0)
MCV: 73.4 fL — ABNORMAL LOW (ref 78.0–100.0)
Platelets: 309 10*3/uL (ref 150–400)
RBC: 4.32 MIL/uL (ref 3.87–5.11)
RDW: 17.3 % — ABNORMAL HIGH (ref 11.5–15.5)
WBC: 6.8 10*3/uL (ref 4.0–10.5)

## 2012-05-12 LAB — TSH: TSH: 13.509 u[IU]/mL — ABNORMAL HIGH (ref 0.350–4.500)

## 2012-05-12 MED ORDER — LEVOTHYROXINE SODIUM 300 MCG PO TABS: 300.0000 ug | ORAL_TABLET | Freq: Every day | ORAL | Status: DC

## 2012-05-14 ENCOUNTER — Telehealth: Payer: Self-pay | Admitting: *Deleted

## 2012-05-14 LAB — T3, FREE: T3, Free: 2.5 pg/mL (ref 2.3–4.2)

## 2012-05-14 LAB — T4, FREE: Free T4: 0.77 ng/dL — ABNORMAL LOW (ref 0.80–1.80)

## 2012-05-14 NOTE — Telephone Encounter (Signed)
Pt schedule for Infed infusion at [facility] short stay on Thursday 05/21/12. Has to be at admitting at 8:45am and 9am at short stay. Pt has been informed.

## 2012-05-16 ENCOUNTER — Emergency Department
Admission: EM | Admit: 2012-05-16 | Discharge: 2012-05-16 | Disposition: A | Discharge status: Home / Self Care | Payer: 59 | Source: Home / Self Care

## 2012-05-16 ENCOUNTER — Encounter: Payer: Self-pay | Admitting: *Deleted

## 2012-05-16 DIAGNOSIS — G43909 Migraine, unspecified, not intractable, without status migrainosus: Secondary | ICD-10-CM

## 2012-05-16 MED ORDER — PROMETHAZINE HCL 25 MG PO TABS: 25.0000 mg | ORAL_TABLET | Freq: Four times a day (QID) | ORAL | Status: DC | PRN

## 2012-05-16 MED ORDER — KETOROLAC TROMETHAMINE 60 MG/2ML IM SOLN
60.0000 mg | Freq: Once | INTRAMUSCULAR | Status: AC
  Administered 2012-05-16: 60 mg via INTRAMUSCULAR

## 2012-05-16 MED ORDER — PROMETHAZINE HCL 25 MG/ML IJ SOLN
25.0000 mg | Freq: Once | INTRAMUSCULAR | Status: AC
  Administered 2012-05-16: 25 mg via INTRAMUSCULAR

## 2012-05-16 MED ORDER — SUMATRIPTAN SUCCINATE 25 MG PO TABS: 25.0000 mg | ORAL_TABLET | ORAL | Status: DC | PRN

## 2012-05-16 NOTE — ED Notes (Signed)
Pt c/o migraine started midnight. Tried OTC Advil with no help. States she is anemic and is going in for Iron Infusion this week.

## 2012-05-16 NOTE — ED Provider Notes (Signed)
History     CSN: [id number]  Arrival date & time 05/16/12  1212   None     Chief Complaint  Patient presents with  . Migraine   HPI Comments: Patient prior history of migraines in the past. Patient states she usually gets to 3 migraines per year. Headache is female left-sided with aching/throbbing nature. Has has associated photophobia nausea with this. There has been no change in the morphology of her headache. Paresis or confusion. No vision loss. Headache as 7/10 currently. Headache deeply patient about her sleep. Patient states this is normal for headaches.  Patient is a 42 y.o. female presenting with migraines. The history is provided by the patient.  Migraine This is a recurrent problem. Episode onset: last night at midnight. The problem occurs constantly. The problem has not changed since onset.She has tried nothing for the symptoms.    Past Medical History  Diagnosis Date  . Asthma   . Depression   . Anxiety   . GERD (gastroesophageal reflux disease)   . Hypothyroidism   . Endometriosis   . History of sinus surgery 2010    MAXILLARY, ETHMOID, SPHENOID  . Obesity, Class III, BMI 40-49.9 (morbid obesity)   . SVD (spontaneous vaginal delivery)     x3  . Generalized headaches   . Adenomyosis 2013  . Kidney stones   . Complication of anesthesia     scoline pain? from use of Succinylcholine   . Anemia     Past Surgical History  Procedure Date  . Knee surgery     right, scope and ACL repair  . Nasal sinus surgery 2010  . Colposcopy 2007  . Laparoscopy 05/30/2011    Procedure: LAPAROSCOPY OPERATIVE;  Surgeon: Juluis Mire, MD;  Location: [facility];  Service: Gynecology;  Laterality: N/A;  YAG  LASER of Endometriosis.  . Laparoscopic assisted vaginal hysterectomy 11/13/2011    Procedure: LAPAROSCOPIC ASSISTED VAGINAL HYSTERECTOMY;  Surgeon: Juluis Mire, MD;  Location: [facility];  Service: Gynecology;  Laterality: N/A;  . Abdominal hysterectomy   . Laparoscopic appendectomy  01/02/2012    Procedure: APPENDECTOMY LAPAROSCOPIC;  Surgeon: Shelly Rubenstein, MD;  Location: [facility];  Service: General;  Laterality: N/A;  . Appendectomy   . Partial colectomy 01/27/2012  . Laparoscopic cholecystectomy 01/27/2012  . Cholecystectomy 01/27/2012    Procedure: LAPAROSCOPIC CHOLECYSTECTOMY;  Surgeon: Shelly Rubenstein, MD;  Location: [facility];  Service: General;  Laterality: N/A;  Laparoscopic chole    Family History  Problem Relation Age of Onset  . Lung cancer Father   . Alcohol abuse Father   . Colon cancer Paternal Grandmother   . Depression Mother   . Depression Sister   . Suicidality Brother   . Depression Brother   . Alcohol abuse Brother   . Drug abuse Brother   . Depression Sister   . Anesthesia problems Neg Hx     History  Substance Use Topics  . Smoking status: Never Smoker   . Smokeless tobacco: Never Used  . Alcohol Use: No    OB History    Grav Para Term Preterm Abortions TAB SAB Ect Mult Living   3 3 3       3       Review of Systems  All other systems reviewed and are negative.    Allergies  Ciprofloxacin; Clarithromycin; Moxifloxacin; and Reglan  Home Medications   Current Outpatient Rx  Name  Route  Sig  Dispense  Refill  . ALBUTEROL  SULFATE HFA 108 (90 BASE) MCG/ACT IN AERS   Inhalation   Inhale 2 puffs into the lungs every 4 (four) hours as needed for wheezing (cough, shortness of breath or wheezing.).   1 Inhaler   3   . CYANOCOBALAMIN 1000 MCG/ML IJ SOLN      INJECT 1 ML (1,000 MCG TOTAL) INTO THE MUSCLE EVERY 30 (THIRTY) DAYS.   1 mL   3   . DULOXETINE HCL 30 MG PO CPEP   Oral   Take 1 capsule (30 mg total) by mouth daily.   90 capsule   0   . ESOMEPRAZOLE MAGNESIUM 40 MG PO CPDR   Oral   Take 1 capsule (40 mg total) by mouth daily.   90 capsule   3   . INTEGRA PLUS PO CAPS   Oral   Take 1 capsule by mouth daily.   30 capsule   4     Samples of this drug were given to the patient, qu ...   .  FLUTICASONE PROPIONATE 50 MCG/ACT NA SUSP      One spray in each nostril twice a day, use left hand for right nostril, and right hand for left nostril.   48 g   3   . FLUTICASONE-SALMETEROL 250-50 MCG/DOSE IN AEPB   Inhalation   Inhale 1 puff into the lungs daily.         Marland Kitchen HYDROCODONE-HOMATROPINE 5-1.5 MG/5ML PO SYRP   Oral   Take 5 mLs by mouth every 6 (six) hours as needed for cough.   120 mL   0   . LEVOTHYROXINE SODIUM 300 MCG PO TABS   Oral   Take 1 tablet (300 mcg total) by mouth daily.   90 tablet   3   . LORAZEPAM 0.5 MG PO TABS   Oral   Take 1 tablet (0.5 mg total) by mouth 2 (two) times daily as needed for anxiety.   60 tablet   0   . NEEDLE (DISP) 23G X 1" MISC      Use weekly for B12 injections. Quantity sufficient for 3 months.   1 each   11   . SERTRALINE HCL 100 MG PO TABS   Oral   Take 2 tablets (200 mg total) by mouth daily.   60 tablet   1   . SYRINGE (DISPOSABLE) 1 ML MISC      Use weekly for B12 injections. Quantity sufficient for 3 months.   1 each   0     BP 129/88  Pulse 98  Temp 98.8 F (37.1 C) (Oral)  Resp 16  Ht 5\' 4"  (1.626 m)  Wt 251 lb 8 oz (114.08 kg)  BMI 43.17 kg/m2  SpO2 99%  LMP 11/02/2011  Physical Exam  Constitutional: She appears well-developed and well-nourished.  HENT:  Head: Normocephalic and atraumatic.  Right Ear: External ear normal.  Left Ear: External ear normal.  Eyes: Conjunctivae normal are normal. Pupils are equal, round, and reactive to light.       + photophobia on funduscopic exam    Neck: Normal range of motion. Neck supple.  Cardiovascular: Normal rate, regular rhythm and normal heart sounds.   Pulmonary/Chest: Effort normal and breath sounds normal.  Abdominal: Soft.  Musculoskeletal: Normal range of motion.  Neurological: She is alert.  Skin: Skin is warm.    ED Course  Procedures (including critical care time)  Labs Reviewed - No data to display No results found.  1.  Migraine       MDM  Migrainous flare. Treated with IM Toradol IM Phenergan here in clinic today. Patient does report clinical improvement in symptoms status post treatment. Will place patient on outpatient Phenergan for nausea. Prescription for when necessary Imitrex was given. Patient had some concerns about serotonin syndrome as a pharmacist discussed  this with her in the past when patient was on full dose of Zoloft and starting Cymbalta. Patient denies any true history of serotonin syndrome in the past. Patient is currently no longer taking Cymbalta. Discussed patient there is some relative risk of serotonin syndrome with triptans and SSRIs albeit relatively low. Discussed patient that she can try medication cautiously-half a tablet of Imitrex for abortive treatment once for her next migraine if migraine is severe. Follow up with PCP if sxs persist to discuss ? Prophylactic treatment.     The patient and/or caregiver has been counseled thoroughly with regard to treatment plan and/or medications prescribed including dosage, schedule, interactions, rationale for use, and possible side effects and they verbalize understanding. Diagnoses and expected course of recovery discussed and will return if not improved as expected or if the condition worsens. Patient and/or caregiver verbalized understanding.            Doree Albee, MD 05/16/12 1308

## 2012-05-18 ENCOUNTER — Ambulatory Visit (INDEPENDENT_AMBULATORY_CARE_PROVIDER_SITE_OTHER): Payer: 59 | Admitting: Sports Medicine

## 2012-05-18 ENCOUNTER — Other Ambulatory Visit (HOSPITAL_COMMUNITY): Payer: Self-pay | Admitting: Sports Medicine

## 2012-05-18 ENCOUNTER — Encounter: Payer: Self-pay | Admitting: Sports Medicine

## 2012-05-18 VITALS — BP 141/90 | HR 92 | Wt 254.0 lb

## 2012-05-18 DIAGNOSIS — F341 Dysthymic disorder: Secondary | ICD-10-CM

## 2012-05-18 DIAGNOSIS — F418 Other specified anxiety disorders: Secondary | ICD-10-CM

## 2012-05-18 DIAGNOSIS — J45909 Unspecified asthma, uncomplicated: Secondary | ICD-10-CM

## 2012-05-18 MED ORDER — HYDROCODONE-HOMATROPINE 5-1.5 MG/5ML PO SYRP: 5.0000 mL | ORAL_SOLUTION | Freq: Four times a day (QID) | ORAL | Status: DC | PRN

## 2012-05-18 NOTE — Assessment & Plan Note (Signed)
Persistent. Currently taking Advair. She has an appointment with pulmonologist coming up fairly soon, to recap, the cough has been persistent for greater than 3 months despite multiple inhaled medications, antibiotics, imaging studies. I am going to refill her Hycodan.

## 2012-05-18 NOTE — Assessment & Plan Note (Signed)
This was well controlled with a combination of Cymbalta and Zoloft. This was prescribed by her previous psychiatrist. She never had any symptoms or signs of serotonin syndrome, but the pharmacist told her that the combination could be dangerous. I have advised her to go back on the 2 as they were working, and seek further advice from her psychiatrist. She will try Dr. Sunday Shams downstairs.

## 2012-05-18 NOTE — Progress Notes (Signed)
Subjective:    CC: Question about medications  HPI: [name] is a very pleasant 42 year old female who comes back with concerns about being on Zoloft and Cymbalta together. She was treated with her psychiatrist with these 2 medicines, and has had excellent response for both anxiety and depression. Her pharmacist recently told her that she needs to come off of both of these medicines immediately. She has no symptoms of serotonin syndrome, no flushing, palpitations, fevers, muscle rigidity. She used to see Dr. Demetrius Charity. downstairs, and does need to find another psychiatrist. She is amenable to seeing Dr. Christell Constant downstairs if possible.  Persistent cough: See below and assessment and plan, does have a followup coming up very soon with the pulmonologist.  Past medical history, Surgical history, Family history not pertinant except as noted below, Social history, Allergies, and medications have been entered into the medical record, reviewed, and no changes needed.   Review of Systems: No fevers, chills, night sweats, weight loss, chest pain, or shortness of breath.   Objective:    General: Well Developed, well nourished, and in no acute distress.  Neuro: Alert and oriented x3, extra-ocular muscles intact, sensation grossly intact.  HEENT: Normocephalic, atraumatic, pupils equal round reactive to light, neck supple, no masses, no lymphadenopathy, thyroid nonpalpable.  Skin: Warm and dry, no rashes. Cardiac: Regular rate and rhythm, no murmurs rubs or gallops.  Respiratory: Clear to auscultation bilaterally. Not using accessory muscles, speaking in full sentences.  Impression and Recommendations:

## 2012-05-19 ENCOUNTER — Other Ambulatory Visit (HOSPITAL_COMMUNITY): Payer: Self-pay | Admitting: Pharmacist

## 2012-05-20 ENCOUNTER — Encounter (HOSPITAL_COMMUNITY): Payer: Self-pay

## 2012-05-21 ENCOUNTER — Encounter (HOSPITAL_COMMUNITY)
Admission: RE | Admit: 2012-05-21 | Discharge: 2012-05-21 | Disposition: A | Discharge status: Home / Self Care | Payer: 59 | Source: Ambulatory Visit | Attending: Sports Medicine | Admitting: Sports Medicine

## 2012-05-21 ENCOUNTER — Encounter (HOSPITAL_COMMUNITY): Payer: Self-pay

## 2012-05-21 DIAGNOSIS — D509 Iron deficiency anemia, unspecified: Secondary | ICD-10-CM | POA: Insufficient documentation

## 2012-05-21 HISTORY — DX: Iron deficiency anemia, unspecified: D50.9

## 2012-05-21 MED ORDER — SODIUM CHLORIDE 0.9 % IV SOLN
1300.0000 mg | Freq: Once | INTRAVENOUS | Status: AC
  Administered 2012-05-21: 1300 mg via INTRAVENOUS
  Filled 2012-05-21: qty 26

## 2012-05-21 MED ORDER — SODIUM CHLORIDE 0.9 % IV SOLN
INTRAVENOUS | Status: AC
  Administered 2012-05-21: 09:00:00 via INTRAVENOUS

## 2012-05-21 MED ORDER — SODIUM CHLORIDE 0.9 % IV SOLN
25.0000 mg | Freq: Once | INTRAVENOUS | Status: AC
  Administered 2012-05-21: 25 mg via INTRAVENOUS
  Filled 2012-05-21: qty 0.5

## 2012-05-21 NOTE — Progress Notes (Signed)
This patient calculates a dose of 1300mg  based upon her height, weight, Hgb (per lab work), and desired goal for her hemoglobin to be 15.  Noted to have no reaction to the 25mg  test dose. Will send 1300mg  infusion x 1 now.

## 2012-05-25 ENCOUNTER — Institutional Professional Consult (permissible substitution): Payer: Self-pay | Admitting: Critical Care Medicine

## 2012-05-25 DIAGNOSIS — G43909 Migraine, unspecified, not intractable, without status migrainosus: Secondary | ICD-10-CM

## 2012-06-04 ENCOUNTER — Ambulatory Visit (INDEPENDENT_AMBULATORY_CARE_PROVIDER_SITE_OTHER): Payer: 59 | Admitting: Sports Medicine

## 2012-06-04 ENCOUNTER — Encounter: Payer: Self-pay | Admitting: Sports Medicine

## 2012-06-04 VITALS — BP 143/96 | HR 99 | Temp 98.6°F

## 2012-06-04 DIAGNOSIS — D509 Iron deficiency anemia, unspecified: Secondary | ICD-10-CM

## 2012-06-04 DIAGNOSIS — J329 Chronic sinusitis, unspecified: Secondary | ICD-10-CM

## 2012-06-04 MED ORDER — HYDROCODONE-HOMATROPINE 5-1.5 MG/5ML PO SYRP: 5.0000 mL | ORAL_SOLUTION | Freq: Four times a day (QID) | ORAL | Status: DC | PRN

## 2012-06-04 MED ORDER — AZITHROMYCIN 250 MG PO TABS: ORAL_TABLET | ORAL | Status: DC

## 2012-06-04 NOTE — Assessment & Plan Note (Signed)
Symptoms are multiply recurrent. I am going to treat this episode with azithromycin and Flonase. It is possible that her chronic cough is related to sinusitis, a recent head CT did show evidence of chronic sinusitis. She does have a visit coming up with Dr. Delford Field regarding her chronic cough. Her chest CT was negative. She's had sinus surgery times one earlier, I would like her to see Dr. Lazarus Salines at [facility] for consideration of further surgical treatment.

## 2012-06-04 NOTE — Progress Notes (Signed)
Subjective:    CC: Sinus infection  HPI:  Sinusitis: I have seen [name] multiple times for sinus infections, she is in fact had sinus surgery in the past. She's also had a chronic cough with negative CT scan of the chest. He does have a pulmonologist visit coming up unfortunately she is recurrent episodes of pressure and pain in her maxillary sinuses with nasal drainage. Symptoms are progressive, and without radiation. A recent CT scan of the head and neck done for another reason did show evidence of chronic sinusitis.  Anemia: Recently had the iron dextran infusion, feeling significantly better.  Past medical history, Surgical history, Family history not pertinant except as noted below, Social history, Allergies, and medications have been entered into the medical record, reviewed, and no changes needed.   Review of Systems: No fevers, chills, night sweats, weight loss, chest pain, or shortness of breath.   Objective:    General: Well Developed, well nourished, and in no acute distress.  Neuro: Alert and oriented x3, extra-ocular muscles intact, sensation grossly intact.  HEENT: Normocephalic, atraumatic, pupils equal round reactive to light, neck supple, no masses, no lymphadenopathy, thyroid nonpalpable. Extraocular canal and oropharynx are unremarkable to inspection, nasopharynx is boggy and erythematous turbinates. There is tenderness to palpation over the maxillary sinuses. Skin: Warm and dry, no rashes. Cardiac: Regular rate and rhythm, no murmurs rubs or gallops.  Respiratory: Clear to auscultation bilaterally. Not using accessory muscles, speaking in full sentences.  Impression and Recommendations:

## 2012-06-04 NOTE — Assessment & Plan Note (Signed)
Significantly improved after iron dextran infusion.

## 2012-06-11 ENCOUNTER — Ambulatory Visit (INDEPENDENT_AMBULATORY_CARE_PROVIDER_SITE_OTHER): Payer: 59 | Admitting: Critical Care Medicine

## 2012-06-11 ENCOUNTER — Other Ambulatory Visit: Payer: Self-pay | Admitting: *Deleted

## 2012-06-11 ENCOUNTER — Encounter: Payer: Self-pay | Admitting: Critical Care Medicine

## 2012-06-11 VITALS — BP 122/74 | HR 95 | Temp 98.0°F | Ht 64.0 in | Wt 257.0 lb

## 2012-06-11 DIAGNOSIS — J45901 Unspecified asthma with (acute) exacerbation: Secondary | ICD-10-CM

## 2012-06-11 DIAGNOSIS — J019 Acute sinusitis, unspecified: Secondary | ICD-10-CM

## 2012-06-11 DIAGNOSIS — G43909 Migraine, unspecified, not intractable, without status migrainosus: Secondary | ICD-10-CM

## 2012-06-11 DIAGNOSIS — J45909 Unspecified asthma, uncomplicated: Secondary | ICD-10-CM

## 2012-06-11 DIAGNOSIS — J329 Chronic sinusitis, unspecified: Secondary | ICD-10-CM

## 2012-06-11 DIAGNOSIS — J4541 Moderate persistent asthma with (acute) exacerbation: Secondary | ICD-10-CM

## 2012-06-11 MED ORDER — AMOXICILLIN-POT CLAVULANATE 875-125 MG PO TABS: 1.0000 | ORAL_TABLET | Freq: Two times a day (BID) | ORAL | Status: DC

## 2012-06-11 MED ORDER — FLUTICASONE-SALMETEROL 250-50 MCG/DOSE IN AEPB: 1.0000 | INHALATION_SPRAY | Freq: Two times a day (BID) | RESPIRATORY_TRACT | Status: DC

## 2012-06-11 MED ORDER — PREDNISONE 10 MG PO TABS: ORAL_TABLET | ORAL | Status: DC

## 2012-06-11 MED ORDER — MOMETASONE FURO-FORMOTEROL FUM 200-5 MCG/ACT IN AERO: 2.0000 | INHALATION_SPRAY | Freq: Two times a day (BID) | RESPIRATORY_TRACT | Status: DC

## 2012-06-11 MED ORDER — FLUTICASONE PROPIONATE 50 MCG/ACT NA SUSP: NASAL | Status: DC

## 2012-06-11 MED ORDER — HYDROCODONE-HOMATROPINE 5-1.5 MG/5ML PO SYRP: 5.0000 mL | ORAL_SOLUTION | Freq: Four times a day (QID) | ORAL | Status: DC | PRN

## 2012-06-11 MED ORDER — BENZONATATE 100 MG PO CAPS: ORAL_CAPSULE | ORAL | Status: DC

## 2012-06-11 NOTE — Assessment & Plan Note (Signed)
Chronic sinusitis with this is a major precipitating factor for asthma flare

## 2012-06-11 NOTE — Assessment & Plan Note (Signed)
Moderate persistent asthma with associated chronic sinusitis and reflux disease  As major precipitating factors. Plan Increase flonase two puff twice daily each nostril Use NEIL MED sinus rinse twice daily Stop advair Start Dulera two puff twice daily Prednisone 10mg  Take 4 for three days 3 for three days 2 for three days 1 for three days and stop Augmentin 875mg  twice daily x 10days Stay on nexium , take 1/2 before breakfast then eat Follow STRICT reflux diet Follow cyclical cough protocol, use hycodan (refill sent) and benzonatate Keep ENT appt. Return 2 weeks for recheck with Rubye Oaks NP then see Delford Field in 6 weeks

## 2012-06-11 NOTE — Patient Instructions (Addendum)
Increase flonase two puff twice daily each nostril Use NEIL MED sinus rinse twice daily Stop advair Start Dulera two puff twice daily Prednisone 10mg  Take 4 for three days 3 for three days 2 for three days 1 for three days and stop Augmentin 875mg  twice daily x 10days Stay on nexium , take 1/2 before breakfast then eat Follow STRICT reflux diet Follow cyclical cough protocol, use hycodan (refill sent) and benzonatate Keep ENT appt. Return 2 weeks for recheck with Rubye Oaks NP then see Delford Field in 6 weeks

## 2012-06-11 NOTE — Progress Notes (Signed)
Subjective:    Patient ID: [name], female    DOB: [birth year], 42 y.o.   MRN: [id number]  HPI Comments: Dx asthma as child. Now chronic cough x 3 months URI was trigger and ? Flu 03/2012. Did not see MD then for this. Pt had high fever aches and pains. 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   Asthma She complains of chest tightness, cough, difficulty breathing, frequent throat clearing, hoarse voice, shortness of breath, sputum production and wheezing. There is no hemoptysis. Primary symptoms comments: Cough is dry and productive. This is a recurrent problem. The current episode started more than 1 month ago (asthma lifelong). The problem occurs constantly. The problem has been unchanged. The cough is productive of sputum, productive, dry, hacking, hoarse, nocturnal, paroxysmal and supine. Associated symptoms include appetite change, dyspnea on exertion, ear congestion, ear pain, a fever, headaches, nasal congestion, PND, postnasal drip, rhinorrhea and a sore throat. Pertinent negatives include no chest pain, heartburn, myalgias, orthopnea, sneezing, sweats or trouble swallowing. Associated symptoms comments: Hx of GERD, on nexium, no breaththrough now. Her symptoms are aggravated by lying down, URI, change in weather, any activity, exercise, exposure to fumes, exposure to smoke, pollen and eating. Her symptoms are alleviated by beta-agonist, steroid inhaler and oral steroids. She reports moderate improvement on treatment. Her past medical history is significant for asthma. There is no history of bronchiectasis, COPD or pneumonia.   Past Medical History  Diagnosis Date  . Asthma   . Depression   . Anxiety   . GERD (gastroesophageal reflux disease)   . Hypothyroidism   . Endometriosis   . History of sinus surgery 2010    MAXILLARY, ETHMOID, SPHENOID  . Obesity, Class III, BMI 40-49.9 (morbid obesity)   . SVD (spontaneous vaginal delivery)     x3  . Generalized  headaches   . Adenomyosis 2013  . Kidney stones   . Complication of anesthesia     scoline pain? from use of Succinylcholine   . Anemia   . Iron deficiency anemia   . Cough   . Cancer of appendix   . Migraine      Family History  Problem Relation Age of Onset  . Lung cancer Father   . Alcohol abuse Father   . Colon cancer Paternal Grandmother   . Depression Mother   . Depression Sister   . Suicidality Brother   . Depression Brother   . Alcohol abuse Brother   . Drug abuse Brother   . Depression Sister   . Anesthesia problems Neg Hx   . Asthma Mother   . Asthma Sister      History   Social History  . Marital Status: Married    Spouse Name: N/A    Number of Children: 3  . Years of Education: N/A   Occupational History  . RN [employer]   Social History Main Topics  . Smoking status: Never Smoker   . Smokeless tobacco: Never Used  . Alcohol Use: No  . Drug Use: No  . Sexually Active: Yes -- Female partner(s)    Birth Control/ Protection: None   Other Topics Concern  . Not on file   Social History Narrative  . No narrative on file     Allergies  Allergen Reactions  . Ciprofloxacin Nausea And Vomiting  . Clarithromycin Hives  . Moxifloxacin Hives  . Reglan (Metoclopramide) Other (See Comments)    Caused a dystonic  reaction.     Outpatient Prescriptions Prior to Visit  Medication Sig Dispense Refill  . albuterol (PROVENTIL HFA;VENTOLIN HFA) 108 (90 BASE) MCG/ACT inhaler Inhale 2 puffs into the lungs every 4 (four) hours as needed for wheezing (cough, shortness of breath or wheezing.).  1 Inhaler  3  . cyanocobalamin (,VITAMIN B-12,) 1000 MCG/ML injection INJECT 1 ML (1,000 MCG TOTAL) INTO THE MUSCLE EVERY 30 (THIRTY) DAYS.  1 mL  3  . DULoxetine (CYMBALTA) 30 MG capsule Take 1 capsule (30 mg total) by mouth daily.  90 capsule  0  . esomeprazole (NEXIUM) 40 MG capsule Take 1 capsule (40 mg total) by mouth daily.  90 capsule  3  . levothyroxine (SYNTHROID,  LEVOTHROID) 300 MCG tablet Take 1 tablet (300 mcg total) by mouth daily.  90 tablet  3  . LORazepam (ATIVAN) 0.5 MG tablet Take 1 tablet (0.5 mg total) by mouth 2 (two) times daily as needed for anxiety.  60 tablet  0  . NEEDLE, DISP, 23 G 23G X 1" MISC Use weekly for B12 injections. Quantity sufficient for 3 months.  1 each  11  . promethazine (PHENERGAN) 25 MG tablet Take 1 tablet (25 mg total) by mouth every 6 (six) hours as needed for nausea.  30 tablet  0  . sertraline (ZOLOFT) 100 MG tablet Take 2 tablets (200 mg total) by mouth daily.  60 tablet  1  . SUMAtriptan (IMITREX) 25 MG tablet Take 1 tablet (25 mg total) by mouth every 2 (two) hours as needed for migraine.  10 tablet  0  . Syringe, Disposable, 1 ML MISC Use weekly for B12 injections. Quantity sufficient for 3 months.  1 each  0  . fluticasone (FLONASE) 50 MCG/ACT nasal spray One spray in each nostril twice a day, use left hand for right nostril, and right hand for left nostril.  48 g  3  . Fluticasone-Salmeterol (ADVAIR) 250-50 MCG/DOSE AEPB Inhale 1 puff into the lungs 2 (two) times daily.       Marland Kitchen HYDROcodone-homatropine (HYCODAN) 5-1.5 MG/5ML syrup Take 5 mLs by mouth every 6 (six) hours as needed for cough.  120 mL  0  . azithromycin (ZITHROMAX Z-PAK) 250 MG tablet Take 2 tablets (500 mg) on  Day 1,  followed by 1 tablet (250 mg) once daily on Days 2 through 5.  6 each  0  . FeFum-FePoly-FA-B Cmp-C-Biot (INTEGRA PLUS) CAPS Take 1 capsule by mouth daily.  30 capsule  4   No facility-administered medications prior to visit.       Review of Systems  Constitutional: Positive for fever, diaphoresis, appetite change and fatigue. Negative for chills, activity change and unexpected weight change.  HENT: Positive for ear pain, congestion, sore throat, hoarse voice, rhinorrhea, postnasal drip and tinnitus. Negative for hearing loss, nosebleeds, facial swelling, sneezing, mouth sores, trouble swallowing, neck pain, neck stiffness, dental  problem, voice change, sinus pressure and ear discharge.   Eyes: Positive for itching. Negative for photophobia, discharge and visual disturbance.  Respiratory: Positive for cough, sputum production, shortness of breath and wheezing. Negative for apnea, hemoptysis, choking, chest tightness and stridor.   Cardiovascular: Positive for dyspnea on exertion and PND. Negative for chest pain, palpitations and leg swelling.  Gastrointestinal: Positive for nausea. Negative for heartburn, vomiting, abdominal pain, constipation, blood in stool and abdominal distention.  Genitourinary: Negative for dysuria, urgency, frequency, hematuria, flank pain, decreased urine volume and difficulty urinating.  Musculoskeletal: Negative for myalgias, back pain, joint  swelling, arthralgias and gait problem.  Skin: Negative for color change, pallor and rash.  Neurological: Positive for headaches. Negative for dizziness, tremors, seizures, syncope, speech difficulty, weakness, light-headedness and numbness.  Hematological: Negative for adenopathy. Does not bruise/bleed easily.  Psychiatric/Behavioral: Negative for confusion, sleep disturbance and agitation. The patient is not nervous/anxious.        Objective:   Physical Exam Filed Vitals:   06/11/12 0926  BP: 122/74  Pulse: 95  Temp: 98 F (36.7 C)  TempSrc: Oral  Height: 5\' 4"  (1.626 m)  Weight: 257 lb (116.574 kg)  SpO2: 98%    Gen: Pleasant, well-nourished, in no distress,  normal affect  ENT: No lesions,  mouth clear,  oropharynx clear, ++postnasal drip, mod nasal purulence  Neck: No JVD, no TMG, no carotid bruits  Lungs: No use of accessory muscles, no dullness to percussion, exp wheeze, poor airflow  Cardiovascular: RRR, heart sounds normal, no murmur or gallops, no peripheral edema  Abdomen: soft and NT, no HSM,  BS normal  Musculoskeletal: No deformities, no cyanosis or clubbing  Neuro: alert, non focal  Skin: Warm, no lesions or  rashes  CXR: nad      Assessment & Plan:   Moderate persistent asthma with exacerbation Moderate persistent asthma with associated chronic sinusitis and reflux disease  As major precipitating factors. Plan Increase flonase two puff twice daily each nostril Use NEIL MED sinus rinse twice daily Stop advair Start Dulera two puff twice daily Prednisone 10mg  Take 4 for three days 3 for three days 2 for three days 1 for three days and stop Augmentin 875mg  twice daily x 10days Stay on nexium , take 1/2 before breakfast then eat Follow STRICT reflux diet Follow cyclical cough protocol, use hycodan (refill sent) and benzonatate Keep ENT appt. Return 2 weeks for recheck with Rubye Oaks NP then see Delford Field in 6 weeks    Sinusitis, chronic Chronic sinusitis with this is a major precipitating factor for asthma flare    Updated Medication List Outpatient Encounter Prescriptions as of 06/11/2012  Medication Sig Dispense Refill  . albuterol (PROVENTIL HFA;VENTOLIN HFA) 108 (90 BASE) MCG/ACT inhaler Inhale 2 puffs into the lungs every 4 (four) hours as needed for wheezing (cough, shortness of breath or wheezing.).  1 Inhaler  3  . cyanocobalamin (,VITAMIN B-12,) 1000 MCG/ML injection INJECT 1 ML (1,000 MCG TOTAL) INTO THE MUSCLE EVERY 30 (THIRTY) DAYS.  1 mL  3  . DULoxetine (CYMBALTA) 30 MG capsule Take 1 capsule (30 mg total) by mouth daily.  90 capsule  0  . esomeprazole (NEXIUM) 40 MG capsule Take 1 capsule (40 mg total) by mouth daily.  90 capsule  3  . fluticasone (FLONASE) 50 MCG/ACT nasal spray Two puff each nostril twice daily  48 g  3  . HYDROcodone-homatropine (HYCODAN) 5-1.5 MG/5ML syrup Take 5 mLs by mouth every 6 (six) hours as needed for cough.  120 mL  0  . levothyroxine (SYNTHROID, LEVOTHROID) 300 MCG tablet Take 1 tablet (300 mcg total) by mouth daily.  90 tablet  3  . LORazepam (ATIVAN) 0.5 MG tablet Take 1 tablet (0.5 mg total) by mouth 2 (two) times daily as needed for  anxiety.  60 tablet  0  . Multiple Vitamin (MULTIVITAMIN) tablet Take 1 tablet by mouth daily.      Marland Kitchen NEEDLE, DISP, 23 G 23G X 1" MISC Use weekly for B12 injections. Quantity sufficient for 3 months.  1 each  11  . promethazine (  PHENERGAN) 25 MG tablet Take 1 tablet (25 mg total) by mouth every 6 (six) hours as needed for nausea.  30 tablet  0  . sertraline (ZOLOFT) 100 MG tablet Take 2 tablets (200 mg total) by mouth daily.  60 tablet  1  . SUMAtriptan (IMITREX) 25 MG tablet Take 1 tablet (25 mg total) by mouth every 2 (two) hours as needed for migraine.  10 tablet  0  . Syringe, Disposable, 1 ML MISC Use weekly for B12 injections. Quantity sufficient for 3 months.  1 each  0  . [DISCONTINUED] fluticasone (FLONASE) 50 MCG/ACT nasal spray One spray in each nostril twice a day, use left hand for right nostril, and right hand for left nostril.  48 g  3  . [DISCONTINUED] Fluticasone-Salmeterol (ADVAIR) 250-50 MCG/DOSE AEPB Inhale 1 puff into the lungs 2 (two) times daily.       . [DISCONTINUED] HYDROcodone-homatropine (HYCODAN) 5-1.5 MG/5ML syrup Take 5 mLs by mouth every 6 (six) hours as needed for cough.  120 mL  0  . amoxicillin-clavulanate (AUGMENTIN) 875-125 MG per tablet Take 1 tablet by mouth 2 (two) times daily.  20 tablet  0  . benzonatate (TESSALON) 100 MG capsule 1-2 every 4 hours per cough protocol  90 capsule  4  . mometasone-formoterol (DULERA) 200-5 MCG/ACT AERO Inhale 2 puffs into the lungs 2 (two) times daily.  1 Inhaler  0  . predniSONE (DELTASONE) 10 MG tablet Take 4 for three days 3 for three days 2 for three days 1 for three days and stop  30 tablet  0  . [DISCONTINUED] azithromycin (ZITHROMAX Z-PAK) 250 MG tablet Take 2 tablets (500 mg) on  Day 1,  followed by 1 tablet (250 mg) once daily on Days 2 through 5.  6 each  0  . [DISCONTINUED] FeFum-FePoly-FA-B Cmp-C-Biot (INTEGRA PLUS) CAPS Take 1 capsule by mouth daily.  30 capsule  4   No facility-administered encounter medications  on file as of 06/11/2012.

## 2012-06-15 ENCOUNTER — Ambulatory Visit: Payer: Self-pay | Admitting: Family Medicine

## 2012-06-19 ENCOUNTER — Ambulatory Visit: Payer: Self-pay | Admitting: Sports Medicine

## 2012-06-22 ENCOUNTER — Ambulatory Visit (INDEPENDENT_AMBULATORY_CARE_PROVIDER_SITE_OTHER): Payer: 59 | Admitting: Sports Medicine

## 2012-06-22 DIAGNOSIS — K219 Gastro-esophageal reflux disease without esophagitis: Secondary | ICD-10-CM

## 2012-06-22 DIAGNOSIS — J329 Chronic sinusitis, unspecified: Secondary | ICD-10-CM

## 2012-06-22 DIAGNOSIS — D509 Iron deficiency anemia, unspecified: Secondary | ICD-10-CM

## 2012-06-22 DIAGNOSIS — F4001 Agoraphobia with panic disorder: Secondary | ICD-10-CM

## 2012-06-22 MED ORDER — PANTOPRAZOLE SODIUM 40 MG PO TBEC: 40.0000 mg | DELAYED_RELEASE_TABLET | Freq: Every day | ORAL | Status: DC

## 2012-06-22 MED ORDER — LORAZEPAM 0.5 MG PO TABS: 0.2500 mg | ORAL_TABLET | Freq: Two times a day (BID) | ORAL | Status: DC | PRN

## 2012-06-22 NOTE — Assessment & Plan Note (Signed)
Has appt coming up with Dr. Lazarus Salines.

## 2012-06-22 NOTE — Assessment & Plan Note (Signed)
Rechecking CBC

## 2012-06-22 NOTE — Assessment & Plan Note (Signed)
Changing to protonix.

## 2012-06-22 NOTE — Progress Notes (Signed)
Subjective:    CC: Follow up  HPI: Anxiety/depression: Doing very well with Cymbalta and sertraline as a combination. Has had no signs or symptoms of serotonin syndrome. She does desire to come off her Ativan.  Anemia: Continues to improve after iron infusion.  Chronic sinusitis: Has a visit with Dr. Lazarus Salines coming up next week.  Past medical history, Surgical history, Family history not pertinant except as noted below, Social history, Allergies, and medications have been entered into the medical record, reviewed, and no changes needed.   Review of Systems: No fevers, chills, night sweats, weight loss, chest pain, or shortness of breath.   Objective:    General: Well Developed, well nourished, and in no acute distress.  Neuro: Alert and oriented x3, extra-ocular muscles intact, sensation grossly intact.  HEENT: Normocephalic, atraumatic, pupils equal round reactive to light, neck supple, no masses, no lymphadenopathy, thyroid nonpalpable.  Skin: Warm and dry, no rashes. Cardiac: Regular rate and rhythm, no murmurs rubs or gallops.  Respiratory: Clear to auscultation bilaterally. Not using accessory muscles, speaking in full sentences. Impression and Recommendations:

## 2012-06-22 NOTE — Assessment & Plan Note (Signed)
Tapering off of Ativan. This will be her last refill. Continue Cymbalta and sertraline. We will try to get her set up an appointment with Dr. Sunday Shams downstairs.

## 2012-06-23 ENCOUNTER — Ambulatory Visit: Payer: Self-pay | Admitting: Adult Health

## 2012-07-07 ENCOUNTER — Ambulatory Visit: Payer: Self-pay | Admitting: Adult Health

## 2012-07-07 ENCOUNTER — Ambulatory Visit (INDEPENDENT_AMBULATORY_CARE_PROVIDER_SITE_OTHER): Payer: 59 | Admitting: Sports Medicine

## 2012-07-07 VITALS — BP 147/103 | HR 90 | Wt 256.0 lb

## 2012-07-07 DIAGNOSIS — E039 Hypothyroidism, unspecified: Secondary | ICD-10-CM

## 2012-07-07 DIAGNOSIS — G43909 Migraine, unspecified, not intractable, without status migrainosus: Secondary | ICD-10-CM

## 2012-07-07 DIAGNOSIS — R55 Syncope and collapse: Secondary | ICD-10-CM

## 2012-07-07 LAB — CBC
HCT: 38.2 % (ref 36.0–46.0)
Hemoglobin: 12.8 g/dL (ref 12.0–15.0)
MCH: 27.1 pg (ref 26.0–34.0)
MCHC: 33.5 g/dL (ref 30.0–36.0)
MCV: 80.8 fL (ref 78.0–100.0)
Platelets: 304 10*3/uL (ref 150–400)
RBC: 4.73 MIL/uL (ref 3.87–5.11)
RDW: 22.5 % — ABNORMAL HIGH (ref 11.5–15.5)
WBC: 9 10*3/uL (ref 4.0–10.5)

## 2012-07-07 LAB — COMPREHENSIVE METABOLIC PANEL WITH GFR
AST: 16 U/L (ref 0–37)
Alkaline Phosphatase: 81 U/L (ref 39–117)
BUN: 11 mg/dL (ref 6–23)
Creat: 0.72 mg/dL (ref 0.50–1.10)
Glucose, Bld: 97 mg/dL (ref 70–99)
Potassium: 4.1 meq/L (ref 3.5–5.3)
Total Bilirubin: 0.4 mg/dL (ref 0.3–1.2)

## 2012-07-07 LAB — TSH: TSH: 6.974 u[IU]/mL — ABNORMAL HIGH (ref 0.350–4.500)

## 2012-07-07 LAB — COMPREHENSIVE METABOLIC PANEL
ALT: 22 U/L (ref 0–35)
Albumin: 4.3 g/dL (ref 3.5–5.2)
CO2: 29 mEq/L (ref 19–32)
Calcium: 9.3 mg/dL (ref 8.4–10.5)
Chloride: 100 mEq/L (ref 96–112)
Sodium: 138 mEq/L (ref 135–145)
Total Protein: 7.3 g/dL (ref 6.0–8.3)

## 2012-07-07 LAB — LUTEINIZING HORMONE: LH: 15.2 m[IU]/mL

## 2012-07-07 LAB — FOLLICLE STIMULATING HORMONE: FSH: 6.5 m[IU]/mL

## 2012-07-07 MED ORDER — HYDROCODONE-ACETAMINOPHEN 5-325 MG PO TABS: 1.0000 | ORAL_TABLET | Freq: Three times a day (TID) | ORAL | Status: DC | PRN

## 2012-07-07 NOTE — Assessment & Plan Note (Signed)
Highest on my differential diagnosis is perimenopausal syndrome. 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 I think lymphomas unlikely with her recent extensive chest imaging. Checking a quantiferon Gold, TSH, FSH, LH, CBC, CMET. She will stop her sertraline.

## 2012-07-07 NOTE — Assessment & Plan Note (Signed)
She will avoid Imitrex for now. Adding a little bit of hydrocodone for her headaches.

## 2012-07-07 NOTE — Progress Notes (Signed)
Subjective:    CC: Hot flashes and night sweats  HPI: This is a pleasant 42 year-old woman who is status post hysterectomy who presents with hot flashes and night sweats for 2 months, though they have gotten worse over the past month. She reports daily facial flushing that has been noticed by coworkers and night sweats that soak the sheets and her night clothes every night. She has also noticed increased frequency of migraine headaches recently, occurring about once every other week. She has not noticed any cyclic mood changes, irritability, or breast tenderness.  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  Past medical history, Surgical history, Family history not pertinant except as noted below, Social history, Allergies, and medications have been entered into the medical record, reviewed, and no changes needed.   Review of Systems: No fevers, chills, weight loss, chest pain, wheezing, shortness of breath, diplopia, or changes in vision. She has had loose stools since her hemicolectomy, but no recent changes in bowel habits.  Objective:    General: Well Developed, well nourished, and in no acute distress. Mild facial pallor. Neuro: Alert and oriented x3, extra-ocular muscles intact, sensation grossly intact.  HEENT: Normocephalic, atraumatic, pupils equal round reactive to light, neck supple, no masses, no lymphadenopathy, thyroid nonpalpable.  Skin: Warm and dry, no rashes. Cardiac: Regular rate and rhythm, no murmurs rubs or gallops.  Respiratory: Clear to auscultation bilaterally, no wheezes or crackles. Not using accessory muscles, speaking in full sentences. Abdominal: Soft, nontender, nondistended; normoactive bowel sounds  Impression and Recommendations:    I was present for all essential parts of this visit and procedure. Ihor Austin. [name], M.D.

## 2012-07-07 NOTE — Addendum Note (Signed)
Addended by: Monica Becton on: 07/07/2012 05:43 PM   Modules accepted: Orders

## 2012-07-07 NOTE — Assessment & Plan Note (Signed)
On 300 mcg levothyroxine. Still symptomatic I am going to refer her to endocrinology.

## 2012-07-08 LAB — QUANTIFERON TB GOLD ASSAY (BLOOD)
Interferon Gamma Release Assay: NEGATIVE
Mitogen value: 3.15 [IU]/mL
Quantiferon Nil Value: 0.05 [IU]/mL
Quantiferon Tb Ag Minus Nil Value: 0 IU/mL
TB Ag value: 0.04 [IU]/mL

## 2012-07-23 ENCOUNTER — Ambulatory Visit: Payer: Self-pay | Admitting: Critical Care Medicine

## 2012-07-28 ENCOUNTER — Encounter: Payer: Self-pay | Admitting: Sports Medicine

## 2012-07-28 ENCOUNTER — Ambulatory Visit (INDEPENDENT_AMBULATORY_CARE_PROVIDER_SITE_OTHER): Payer: 59 | Admitting: Sports Medicine

## 2012-07-28 VITALS — BP 135/95 | HR 92 | Wt 260.0 lb

## 2012-07-28 DIAGNOSIS — M25569 Pain in unspecified knee: Secondary | ICD-10-CM

## 2012-07-28 DIAGNOSIS — M25561 Pain in right knee: Secondary | ICD-10-CM | POA: Insufficient documentation

## 2012-07-28 DIAGNOSIS — G43909 Migraine, unspecified, not intractable, without status migrainosus: Secondary | ICD-10-CM

## 2012-07-28 DIAGNOSIS — F4001 Agoraphobia with panic disorder: Secondary | ICD-10-CM

## 2012-07-28 DIAGNOSIS — E039 Hypothyroidism, unspecified: Secondary | ICD-10-CM

## 2012-07-28 MED ORDER — LORAZEPAM 0.5 MG PO TABS: 0.5000 mg | ORAL_TABLET | Freq: Every day | ORAL | Status: DC | PRN

## 2012-07-28 MED ORDER — ZOLPIDEM TARTRATE 10 MG PO TABS: 5.0000 mg | ORAL_TABLET | Freq: Every evening | ORAL | Status: DC | PRN

## 2012-07-28 NOTE — Assessment & Plan Note (Signed)
Status post anterior cruciate ligament reconstruction, now with increasing pain, and some swelling but a normal exam. 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 Knee was injected as above. Return in 4 weeks for this.

## 2012-07-28 NOTE — Progress Notes (Signed)
  Subjective:    CC: Followup  HPI: Vasomotor instability: Likely due to perimenopausal status.  Right knee pain: Status post anterior cruciate ligament reconstruction, increasing pain and increasing swelling. No mechanical symptoms. Using oral analgesics, ineffective.  Hypothyroidism: Has not yet seen her endocrinologist she needs a [facility] endocrine doctor.  Migraines: Has been off of Imitrex, does recall being placed in the sleep medicine and this resolved her migraines in the past.  Anxiety/depression: Has been off of sertraline, we also tapered her off of Ativan. Unfortunately she still having some anxiety and desires a short additional course of Ativan.  Past medical history, Surgical history, Family history not pertinant except as noted below, Social history, Allergies, and medications have been entered into the medical record, reviewed, and no changes needed.   Review of Systems: No fevers, chills, night sweats, weight loss, chest pain, or shortness of breath.   Objective:    General: Well Developed, well nourished, and in no acute distress.  Neuro: Alert and oriented x3, extra-ocular muscles intact, sensation grossly intact.  HEENT: Normocephalic, atraumatic, pupils equal round reactive to light, neck supple, no masses, no lymphadenopathy, thyroid nonpalpable.  Skin: Warm and dry, no rashes. Cardiac: Regular rate and rhythm, no murmurs rubs or gallops, no lower extremity edema.  Respiratory: Clear to auscultation bilaterally. Not using accessory muscles, speaking in full sentences. Right Knee: Mild effusion visible and palpable, tender to palpation over the medial joint line. ROM full in flexion and extension and lower leg rotation. Ligaments with solid consistent endpoints including ACL, PCL, LCL, MCL. Negative Mcmurray's, Apley's, and Thessalonian tests. Non painful patellar compression. Patellar glide without crepitus. Patellar and quadriceps tendons  unremarkable. Hamstring and quadriceps strength is normal.   Procedure: Real-time Ultrasound Guided Injection of right knee Device: GE Logiq E  Ultrasound guided injection is preferred based studies that show increased duration, increased effect, greater accuracy, decreased procedural pain, increased response rate, and decreased cost with ultrasound guided versus blind injection.  Verbal informed consent obtained.  Time-out conducted.  Noted no overlying erythema, induration, or other signs of local infection.  Skin prepped in a sterile fashion.  Local anesthesia: Topical Ethyl chloride.  With sterile technique and under real time ultrasound guidance:  2 cc Kenalog 40, 4 cc lidocaine placed into the suprapatellar recess. Completed without difficulty  Pain immediately resolved suggesting accurate placement of the medication.  Advised to call if fevers/chills, erythema, induration, drainage, or persistent bleeding.  Images permanently stored and available for review in the ultrasound unit.  Impression: Technically successful ultrasound guided injection.  Impression and Recommendations:

## 2012-07-28 NOTE — Assessment & Plan Note (Signed)
Still awaiting referral to endocrinology. [facility] is not in her network, we will need to send her to a [facility] endocrinologist.

## 2012-07-28 NOTE — Assessment & Plan Note (Signed)
Okay with restarting Imitrex. A previous migraine Dr. place her on a sleep medicine which helped her migraines. I'm going to Ambien, she will take one half of a tab. We will hold off on Topamax for now.

## 2012-07-28 NOTE — Assessment & Plan Note (Addendum)
Restart sertraline. Also adding an additional course of Ativan, she understands that this will be last prescription.

## 2012-08-06 ENCOUNTER — Ambulatory Visit: Payer: Self-pay | Admitting: Endocrinology

## 2012-08-06 DIAGNOSIS — Z0289 Encounter for other administrative examinations: Secondary | ICD-10-CM

## 2012-08-11 ENCOUNTER — Encounter: Payer: Self-pay | Admitting: Sports Medicine

## 2012-08-11 ENCOUNTER — Ambulatory Visit (INDEPENDENT_AMBULATORY_CARE_PROVIDER_SITE_OTHER): Payer: 59 | Admitting: Sports Medicine

## 2012-08-11 VITALS — BP 146/87 | HR 89 | Resp 16

## 2012-08-11 DIAGNOSIS — M542 Cervicalgia: Secondary | ICD-10-CM

## 2012-08-11 DIAGNOSIS — M47812 Spondylosis without myelopathy or radiculopathy, cervical region: Secondary | ICD-10-CM | POA: Insufficient documentation

## 2012-08-11 DIAGNOSIS — E669 Obesity, unspecified: Secondary | ICD-10-CM

## 2012-08-11 MED ORDER — PREDNISONE 50 MG PO TABS: ORAL_TABLET | ORAL | Status: DC

## 2012-08-11 MED ORDER — CYCLOBENZAPRINE HCL 10 MG PO TABS: ORAL_TABLET | ORAL | Status: DC

## 2012-08-11 NOTE — Progress Notes (Signed)
  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 She pulled, and felt a strain over her left rhomboid. Afterwards, she developed pain involving her entire left trapezius and rhomboid musculature, with numbness and tingling radiating down the left arm in the C7 distribution. She denies any saddle anesthesia or bowel or bladder dysfunction. Pain radiates as above, mild to moderate, improving.  Past medical history, Surgical history, Family history not pertinant except as noted below, Social history, Allergies, and medications have been entered into the medical record, reviewed, and no changes needed.   Review of Systems: No fevers, chills, night sweats, weight loss, chest pain, or shortness of breath.   Objective:    General: Well Developed, well nourished, and in no acute distress.  Neuro: Alert and oriented x3, extra-ocular muscles intact, sensation grossly intact.  HEENT: Normocephalic, atraumatic, pupils equal round reactive to light, neck supple, no masses, no lymphadenopathy, thyroid nonpalpable.  Skin: Warm and dry, no rashes. Cardiac: Regular rate and rhythm, no murmurs rubs or gallops, no lower extremity edema.  Respiratory: Clear to auscultation bilaterally. Not using accessory muscles, speaking in full sentences. Neck: Inspection unremarkable. No palpable stepoffs. Negative Spurling's maneuver. Full neck range of motion Grip strength and sensation normal in bilateral hands Strength good C4 to T1 distribution No sensory change to C4 to T1 Negative Hoffman sign bilaterally Reflexes normal Impression and Recommendations:

## 2012-08-11 NOTE — Assessment & Plan Note (Signed)
With left-sided C7 radiculitis but a benign exam. X-rays, prednisone, Flexeril, on rehabilitation exercises

## 2012-08-11 NOTE — Assessment & Plan Note (Signed)
Adding nutritionist referral. At the next visit we'll discuss phentermine

## 2012-08-12 ENCOUNTER — Ambulatory Visit (HOSPITAL_BASED_OUTPATIENT_CLINIC_OR_DEPARTMENT_OTHER)
Admission: RE | Admit: 2012-08-12 | Discharge: 2012-08-12 | Disposition: A | Discharge status: Home / Self Care | Payer: 59 | Source: Ambulatory Visit | Attending: Sports Medicine | Admitting: Sports Medicine

## 2012-08-12 DIAGNOSIS — R209 Unspecified disturbances of skin sensation: Secondary | ICD-10-CM | POA: Insufficient documentation

## 2012-08-12 DIAGNOSIS — IMO0002 Reserved for concepts with insufficient information to code with codable children: Secondary | ICD-10-CM | POA: Insufficient documentation

## 2012-08-12 DIAGNOSIS — M47812 Spondylosis without myelopathy or radiculopathy, cervical region: Secondary | ICD-10-CM | POA: Insufficient documentation

## 2012-08-12 DIAGNOSIS — M79609 Pain in unspecified limb: Secondary | ICD-10-CM | POA: Insufficient documentation

## 2012-08-12 DIAGNOSIS — M542 Cervicalgia: Secondary | ICD-10-CM | POA: Insufficient documentation

## 2012-08-13 ENCOUNTER — Telehealth: Payer: Self-pay | Admitting: *Deleted

## 2012-08-13 MED ORDER — GABAPENTIN 300 MG PO CAPS: ORAL_CAPSULE | ORAL | Status: DC

## 2012-08-13 NOTE — Telephone Encounter (Signed)
Pt calls & states that she is following all of your instructions but is still in a lot of pain.  She states that she was up all night in pain.  States it's nerve pain shooting all down her left arm.  What else can she do for the pain? Please advise

## 2012-08-13 NOTE — Telephone Encounter (Signed)
Pt.notified

## 2012-08-13 NOTE — Telephone Encounter (Signed)
Gabapentin for use 3 times a day will block excess conduction through the pinched nerve.

## 2012-08-18 ENCOUNTER — Encounter (HOSPITAL_BASED_OUTPATIENT_CLINIC_OR_DEPARTMENT_OTHER): Payer: Self-pay

## 2012-08-18 ENCOUNTER — Emergency Department (HOSPITAL_BASED_OUTPATIENT_CLINIC_OR_DEPARTMENT_OTHER)
Admission: EM | Admit: 2012-08-18 | Discharge: 2012-08-18 | Disposition: A | Discharge status: Home / Self Care | Payer: 59 | Attending: Emergency Medicine | Admitting: Emergency Medicine

## 2012-08-18 DIAGNOSIS — K5289 Other specified noninfective gastroenteritis and colitis: Secondary | ICD-10-CM | POA: Insufficient documentation

## 2012-08-18 DIAGNOSIS — R112 Nausea with vomiting, unspecified: Secondary | ICD-10-CM | POA: Insufficient documentation

## 2012-08-18 DIAGNOSIS — Z20828 Contact with and (suspected) exposure to other viral communicable diseases: Secondary | ICD-10-CM | POA: Insufficient documentation

## 2012-08-18 DIAGNOSIS — Z3202 Encounter for pregnancy test, result negative: Secondary | ICD-10-CM | POA: Insufficient documentation

## 2012-08-18 DIAGNOSIS — Z79899 Other long term (current) drug therapy: Secondary | ICD-10-CM | POA: Insufficient documentation

## 2012-08-18 DIAGNOSIS — R197 Diarrhea, unspecified: Secondary | ICD-10-CM | POA: Insufficient documentation

## 2012-08-18 DIAGNOSIS — F329 Major depressive disorder, single episode, unspecified: Secondary | ICD-10-CM | POA: Insufficient documentation

## 2012-08-18 DIAGNOSIS — G43909 Migraine, unspecified, not intractable, without status migrainosus: Secondary | ICD-10-CM | POA: Insufficient documentation

## 2012-08-18 DIAGNOSIS — Z8742 Personal history of other diseases of the female genital tract: Secondary | ICD-10-CM | POA: Insufficient documentation

## 2012-08-18 DIAGNOSIS — IMO0002 Reserved for concepts with insufficient information to code with codable children: Secondary | ICD-10-CM | POA: Insufficient documentation

## 2012-08-18 DIAGNOSIS — Z8509 Personal history of malignant neoplasm of other digestive organs: Secondary | ICD-10-CM | POA: Insufficient documentation

## 2012-08-18 DIAGNOSIS — K219 Gastro-esophageal reflux disease without esophagitis: Secondary | ICD-10-CM | POA: Insufficient documentation

## 2012-08-18 DIAGNOSIS — R6883 Chills (without fever): Secondary | ICD-10-CM | POA: Insufficient documentation

## 2012-08-18 DIAGNOSIS — K529 Noninfective gastroenteritis and colitis, unspecified: Secondary | ICD-10-CM

## 2012-08-18 DIAGNOSIS — E039 Hypothyroidism, unspecified: Secondary | ICD-10-CM | POA: Insufficient documentation

## 2012-08-18 DIAGNOSIS — J45909 Unspecified asthma, uncomplicated: Secondary | ICD-10-CM | POA: Insufficient documentation

## 2012-08-18 DIAGNOSIS — F411 Generalized anxiety disorder: Secondary | ICD-10-CM | POA: Insufficient documentation

## 2012-08-18 DIAGNOSIS — Z87442 Personal history of urinary calculi: Secondary | ICD-10-CM | POA: Insufficient documentation

## 2012-08-18 DIAGNOSIS — D509 Iron deficiency anemia, unspecified: Secondary | ICD-10-CM | POA: Insufficient documentation

## 2012-08-18 DIAGNOSIS — F3289 Other specified depressive episodes: Secondary | ICD-10-CM | POA: Insufficient documentation

## 2012-08-18 LAB — CBC WITH DIFFERENTIAL/PLATELET
Basophils Relative: 1 % (ref 0–1)
Eosinophils Absolute: 0.3 10*3/uL (ref 0.0–0.7)
Eosinophils Relative: 5 % (ref 0–5)
Hemoglobin: 13.7 g/dL (ref 12.0–15.0)
MCH: 29.4 pg (ref 26.0–34.0)
MCHC: 34.9 g/dL (ref 30.0–36.0)
Monocytes Absolute: 0.6 10*3/uL (ref 0.1–1.0)
Monocytes Relative: 9 % (ref 3–12)
Neutrophils Relative %: 63 % (ref 43–77)

## 2012-08-18 LAB — URINALYSIS, ROUTINE W REFLEX MICROSCOPIC
Bilirubin Urine: NEGATIVE
Ketones, ur: NEGATIVE mg/dL
Nitrite: NEGATIVE
pH: 6.5 (ref 5.0–8.0)

## 2012-08-18 LAB — BASIC METABOLIC PANEL
BUN: 8 mg/dL (ref 6–23)
Calcium: 8.9 mg/dL (ref 8.4–10.5)
Creatinine, Ser: 0.7 mg/dL (ref 0.50–1.10)
GFR calc Af Amer: >90 mL/min (ref >90)
GFR calc non Af Amer: >90 mL/min (ref >90)
Potassium: 3.2 mEq/L — ABNORMAL LOW (ref 3.5–5.1)

## 2012-08-18 MED ORDER — ONDANSETRON HCL 4 MG/2ML IJ SOLN
4.0000 mg | Freq: Once | INTRAMUSCULAR | Status: AC
  Administered 2012-08-18: 4 mg via INTRAVENOUS
  Filled 2012-08-18: qty 2

## 2012-08-18 MED ORDER — SODIUM CHLORIDE 0.9 % IV SOLN: 1000.0000 mL | INTRAVENOUS | Status: DC

## 2012-08-18 MED ORDER — MORPHINE SULFATE 4 MG/ML IJ SOLN
4.0000 mg | Freq: Once | INTRAMUSCULAR | Status: AC
  Administered 2012-08-18: 4 mg via INTRAVENOUS
  Filled 2012-08-18: qty 1

## 2012-08-18 MED ORDER — PROMETHAZINE HCL 25 MG PO TABS: 25.0000 mg | ORAL_TABLET | Freq: Four times a day (QID) | ORAL | Status: DC | PRN

## 2012-08-18 MED ORDER — LOPERAMIDE HCL 2 MG PO CAPS
4.0000 mg | ORAL_CAPSULE | Freq: Once | ORAL | Status: AC
  Administered 2012-08-18: 4 mg via ORAL
  Filled 2012-08-18: qty 2

## 2012-08-18 MED ORDER — SODIUM CHLORIDE 0.9 % IV SOLN
Freq: Once | INTRAVENOUS | Status: AC
  Administered 2012-08-18: 11:00:00 via INTRAVENOUS

## 2012-08-18 MED ORDER — SODIUM CHLORIDE 0.9 % IV SOLN
1000.0000 mL | Freq: Once | INTRAVENOUS | Status: AC
  Administered 2012-08-18: 1000 mL via INTRAVENOUS

## 2012-08-18 MED ORDER — PROMETHAZINE HCL 25 MG/ML IJ SOLN
25.0000 mg | Freq: Once | INTRAMUSCULAR | Status: AC
  Administered 2012-08-18: 25 mg via INTRAMUSCULAR
  Filled 2012-08-18: qty 1

## 2012-08-18 NOTE — ED Notes (Signed)
Pt states that she has abd pain, n/v/d since Sunday, taking phenergan with no relief.  Vomited 1 time in last 12 hours, diarrhea multiple episodes.

## 2012-08-18 NOTE — ED Provider Notes (Signed)
History     CSN: [id number]  Arrival date & time 08/18/12  4098   First MD Initiated Contact with Patient 08/18/12 1046      Chief Complaint  Patient presents with  . Abdominal Pain    (Consider location/radiation/quality/duration/timing/severity/associated sxs/prior treatment) Patient is a 42 y.o. female presenting with abdominal pain. The history is provided by the patient.  Abdominal Pain She has been having crampy abdominal pain, nausea, vomiting, diarrhea for the last 2 days. There was occupational exposure to rotavirus. She's had chills and sweats but denies fever. Pain is moderate and she rates it 5/10. She has not vomited since last night but continues to have nausea. She continues to have profuse diarrhea. She denies blood or mucus in stool or emesis. She has tried taking ondansetron and diphenoxylate but vomited after taking them.  Past Medical History  Diagnosis Date  . Asthma   . Depression   . Anxiety   . GERD (gastroesophageal reflux disease)   . Hypothyroidism   . Endometriosis   . History of sinus surgery 2010    MAXILLARY, ETHMOID, SPHENOID  . Obesity, Class III, BMI 40-49.9 (morbid obesity)   . SVD (spontaneous vaginal delivery)     x3  . Generalized headaches   . Adenomyosis 2013  . Kidney stones   . Complication of anesthesia     scoline pain? from use of Succinylcholine   . Anemia   . Iron deficiency anemia   . Cough   . Cancer of appendix   . Migraine     Past Surgical History  Procedure Laterality Date  . Knee surgery      right, scope and ACL repair  . Nasal sinus surgery  2010  . Colposcopy  2007  . Laparoscopy  05/30/2011    Procedure: LAPAROSCOPY OPERATIVE;  Surgeon: Juluis Mire, MD;  Location: [facility];  Service: Gynecology;  Laterality: N/A;  YAG  LASER of Endometriosis.  . Laparoscopic assisted vaginal hysterectomy  11/13/2011    Procedure: LAPAROSCOPIC ASSISTED VAGINAL HYSTERECTOMY;  Surgeon: Juluis Mire, MD;  Location: [facility];   Service: Gynecology;  Laterality: N/A;  . Abdominal hysterectomy    . Laparoscopic appendectomy  01/02/2012    Procedure: APPENDECTOMY LAPAROSCOPIC;  Surgeon: Shelly Rubenstein, MD;  Location: [facility];  Service: General;  Laterality: N/A;  . Partial colectomy  01/27/2012  . Cholecystectomy  01/27/2012    Procedure: LAPAROSCOPIC CHOLECYSTECTOMY;  Surgeon: Shelly Rubenstein, MD;  Location: [facility];  Service: General;  Laterality: N/A;  Laparoscopic chole    Family History  Problem Relation Age of Onset  . Lung cancer Father   . Alcohol abuse Father   . Colon cancer Paternal Grandmother   . Depression Mother   . Depression Sister   . Suicidality Brother   . Depression Brother   . Alcohol abuse Brother   . Drug abuse Brother   . Depression Sister   . Anesthesia problems Neg Hx   . Asthma Mother   . Asthma Sister     History  Substance Use Topics  . Smoking status: Never Smoker   . Smokeless tobacco: Never Used  . Alcohol Use: No    OB History   Grav Para Term Preterm Abortions TAB SAB Ect Mult Living   3 3 3       3       Review of Systems  Gastrointestinal: Positive for abdominal pain.  All other systems reviewed and are negative.  Allergies  Ciprofloxacin; Clarithromycin; Moxifloxacin; and Reglan  Home Medications   Current Outpatient Rx  Name  Route  Sig  Dispense  Refill  . albuterol (PROVENTIL HFA;VENTOLIN HFA) 108 (90 BASE) MCG/ACT inhaler   Inhalation   Inhale 2 puffs into the lungs every 4 (four) hours as needed for wheezing (cough, shortness of breath or wheezing.).   1 Inhaler   3   . cyanocobalamin (,VITAMIN B-12,) 1000 MCG/ML injection      INJECT 1 ML (1,000 MCG TOTAL) INTO THE MUSCLE EVERY 30 (THIRTY) DAYS.   1 mL   3   . DULoxetine (CYMBALTA) 30 MG capsule   Oral   Take 1 capsule (30 mg total) by mouth daily.   90 capsule   0   . fluticasone (FLONASE) 50 MCG/ACT nasal spray      Two puff each nostril twice daily   48 g   3   .  gabapentin (NEURONTIN) 300 MG capsule      One tab PO qHS for a week, then BID for a week, then TID.  May increase to a max of 4 tabs PO TID.   180 capsule   3   . levothyroxine (SYNTHROID, LEVOTHROID) 300 MCG tablet   Oral   Take 1 tablet (300 mcg total) by mouth daily.   90 tablet   3   . mometasone-formoterol (DULERA) 200-5 MCG/ACT AERO   Inhalation   Inhale 2 puffs into the lungs 2 (two) times daily.   1 Inhaler   0   . Multiple Vitamin (MULTIVITAMIN) tablet   Oral   Take 1 tablet by mouth daily.         . pantoprazole (PROTONIX) 40 MG tablet   Oral   Take 1 tablet (40 mg total) by mouth daily.   30 tablet   3   . cyclobenzaprine (FLEXERIL) 10 MG tablet      One half tab PO qHS, then increase gradually to one tab TID.   30 tablet   0   . HYDROcodone-acetaminophen (NORCO/VICODIN) 5-325 MG per tablet   Oral   Take 1 tablet by mouth every 8 (eight) hours as needed for pain.   40 tablet   0   . NEEDLE, DISP, 23 G 23G X 1" MISC      Use weekly for B12 injections. Quantity sufficient for 3 months.   1 each   11   . predniSONE (DELTASONE) 50 MG tablet      One tab PO daily for 5 days.   5 tablet   0   . Syringe, Disposable, 1 ML MISC      Use weekly for B12 injections. Quantity sufficient for 3 months.   1 each   0   . zolpidem (AMBIEN) 10 MG tablet   Oral   Take 0.5 tablets (5 mg total) by mouth at bedtime as needed for sleep.   15 tablet   3     BP 146/94  Pulse 91  Temp(Src) 98 F (36.7 C) (Oral)  Resp 18  Ht 5\' 4"  (1.626 m)  Wt 250 lb (113.399 kg)  BMI 42.89 kg/m2  SpO2 100%  LMP 11/02/2011  Physical Exam  Nursing note and vitals reviewed.  42 year old female, resting comfortably and in no acute distress. Vital signs are significant for hypertension with blood pressure 146/94. Oxygen saturation is 100%, which is normal. Head is normocephalic and atraumatic. PERRLA, EOMI. Oropharynx is clear. Neck is nontender and  supple without  adenopathy or JVD. Back is nontender and there is no CVA tenderness. Lungs are clear without rales, wheezes, or rhonchi. Chest is nontender. Heart has regular rate and rhythm without murmur. Abdomen is soft, flat, with mild tenderness diffusely. There is no rebound or guarding. There are no masses or hepatosplenomegaly and peristalsis is hypoactive. Extremities have no cyanosis or edema, full range of motion is present. Skin is warm and dry without rash. Neurologic: Mental status is normal, cranial nerves are intact, there are no motor or sensory deficits.  ED Course  Procedures (including critical care time)  Results for orders placed during the hospital encounter of 08/18/12  URINALYSIS, ROUTINE W REFLEX MICROSCOPIC      Result Value Range   Color, Urine YELLOW  YELLOW   APPearance CLEAR  CLEAR   Specific Gravity, Urine 1.012  1.005 - 1.030   pH 6.5  5.0 - 8.0   Glucose, UA NEGATIVE  NEGATIVE mg/dL   Hgb urine dipstick NEGATIVE  NEGATIVE   Bilirubin Urine NEGATIVE  NEGATIVE   Ketones, ur NEGATIVE  NEGATIVE mg/dL   Protein, ur NEGATIVE  NEGATIVE mg/dL   Urobilinogen, UA 1.0  0.0 - 1.0 mg/dL   Nitrite NEGATIVE  NEGATIVE   Leukocytes, UA NEGATIVE  NEGATIVE  CBC WITH DIFFERENTIAL      Result Value Range   WBC 6.3  4.0 - 10.5 K/uL   RBC 4.66  3.87 - 5.11 MIL/uL   Hemoglobin 13.7  12.0 - 15.0 g/dL   HCT 42.5  95.6 - 38.7 %   MCV 84.1  78.0 - 100.0 fL   MCH 29.4  26.0 - 34.0 pg   MCHC 34.9  30.0 - 36.0 g/dL   RDW 56.4 (*) 33.2 - 95.1 %   Platelets 226  150 - 400 K/uL   Neutrophils Relative 63  43 - 77 %   Neutro Abs 3.9  1.7 - 7.7 K/uL   Lymphocytes Relative 22  12 - 46 %   Lymphs Abs 1.4  0.7 - 4.0 K/uL   Monocytes Relative 9  3 - 12 %   Monocytes Absolute 0.6  0.1 - 1.0 K/uL   Eosinophils Relative 5  0 - 5 %   Eosinophils Absolute 0.3  0.0 - 0.7 K/uL   Basophils Relative 1  0 - 1 %   Basophils Absolute 0.0  0.0 - 0.1 K/uL  BASIC METABOLIC PANEL      Result Value Range    Sodium 137  135 - 145 mEq/L   Potassium 3.2 (*) 3.5 - 5.1 mEq/L   Chloride 99  96 - 112 mEq/L   CO2 27  19 - 32 mEq/L   Glucose, Bld 118 (*) 70 - 99 mg/dL   BUN 8  6 - 23 mg/dL   Creatinine, Ser 8.84  0.50 - 1.10 mg/dL   Calcium 8.9  8.4 - 16.6 mg/dL   GFR calc non Af Amer >90  >90 mL/min   GFR calc Af Amer >90  >90 mL/min  PREGNANCY, URINE      Result Value Range   Preg Test, Ur NEGATIVE  NEGATIVE   Dg Cervical Spine Complete  08/12/2012  *RADIOLOGY REPORT*  Clinical Data: Neck pain.The left C7 radicular symptoms.  CERVICAL SPINE - COMPLETE 4+ VIEW  Comparison: No priors.  Findings: Six views of the cervical spine demonstrate no acute displaced fracture.  Alignment is anatomic.  Prevertebral soft tissues are normal.  Mild multilevel degenerative disc disease  and facet arthropathy is noted, most pronounced at C4-C5, C5-C6 and C6- C7.  On the oblique projections there is no definite bony neuroforaminal narrowing noted.  IMPRESSION: 1.  Mild multilevel degenerative disc disease and cervical spondylosis, as above, without acute abnormalities to account for the patient's symptoms.   Original Report Authenticated By: Trudie Reed, M.D.       1. Gastroenteritis       MDM  Viral gastroenteritis which is likely due tomorrow virus. She is given IV fluids, IV ondansetron, and IV morphine as well as oral loperamide.  She feels much better after above noted to treatment although she got an adequate relief of nausea from ondansetron and required promethazine. She is tolerating oral fluids well although she is noting some abdominal cramping with oral fluids. She is sent home with prescription for promethazine and advised to use over-the-counter loperamide as needed.     Dione Booze, MD 08/18/12 1459

## 2012-08-25 ENCOUNTER — Ambulatory Visit (INDEPENDENT_AMBULATORY_CARE_PROVIDER_SITE_OTHER): Payer: 59 | Admitting: Sports Medicine

## 2012-08-25 ENCOUNTER — Encounter: Payer: Self-pay | Admitting: Sports Medicine

## 2012-08-25 VITALS — BP 134/92 | HR 83 | Wt 259.0 lb

## 2012-08-25 DIAGNOSIS — G43909 Migraine, unspecified, not intractable, without status migrainosus: Secondary | ICD-10-CM

## 2012-08-25 DIAGNOSIS — M25561 Pain in right knee: Secondary | ICD-10-CM

## 2012-08-25 DIAGNOSIS — M25569 Pain in unspecified knee: Secondary | ICD-10-CM

## 2012-08-25 DIAGNOSIS — M542 Cervicalgia: Secondary | ICD-10-CM

## 2012-08-25 DIAGNOSIS — E669 Obesity, unspecified: Secondary | ICD-10-CM

## 2012-08-25 MED ORDER — ONDANSETRON 8 MG PO TBDP: 8.0000 mg | ORAL_TABLET | Freq: Three times a day (TID) | ORAL | Status: DC | PRN

## 2012-08-25 MED ORDER — PHENTERMINE HCL 37.5 MG PO CAPS: 37.5000 mg | ORAL_CAPSULE | ORAL | Status: DC

## 2012-08-25 NOTE — Assessment & Plan Note (Signed)
Improved with Imitrex.

## 2012-08-25 NOTE — Assessment & Plan Note (Signed)
Resolved after injection and home exercises.

## 2012-08-25 NOTE — Assessment & Plan Note (Signed)
Greatly improved with Flexeril, prednisone, home exercises

## 2012-08-25 NOTE — Progress Notes (Signed)
  Subjective:    CC: Followup  HPI: Right knee pain: Resolved after injection.  Left C7 radiculitis: Resolved after conservative measures and exercises.  Recent norovirus infection: Improving.  Migraines: Improved with Imitrex.  Obesity: Would like to start phentermine, nutritionist visit in June.  Past medical history, Surgical history, Family history not pertinant except as noted below, Social history, Allergies, and medications have been entered into the medical record, reviewed, and no changes needed.   Review of Systems: No fevers, chills, night sweats, weight loss, chest pain, or shortness of breath.   Objective:    General: Well Developed, well nourished, and in no acute distress.  Neuro: Alert and oriented x3, extra-ocular muscles intact, sensation grossly intact.  HEENT: Normocephalic, atraumatic, pupils equal round reactive to light, neck supple, no masses, no lymphadenopathy, thyroid nonpalpable.  Skin: Warm and dry, no rashes. Cardiac: Regular rate and rhythm, no murmurs rubs or gallops, no lower extremity edema.  Respiratory: Clear to auscultation bilaterally. Not using accessory muscles, speaking in full sentences. Impression and Recommendations:

## 2012-08-25 NOTE — Assessment & Plan Note (Signed)
Appointment with nutritionist as in June. Adding phentermine but she will not start it until her nausea has resolved after Norovirus infection.Marland Kitchen

## 2012-09-04 ENCOUNTER — Telehealth: Payer: Self-pay | Admitting: *Deleted

## 2012-09-04 ENCOUNTER — Other Ambulatory Visit: Payer: Self-pay | Admitting: Sports Medicine

## 2012-09-04 MED ORDER — PREDNISONE 10 MG PO TABS: ORAL_TABLET | ORAL | Status: DC

## 2012-09-04 MED ORDER — TRAMADOL HCL 50 MG PO TABS: 50.0000 mg | ORAL_TABLET | Freq: Three times a day (TID) | ORAL | Status: DC | PRN

## 2012-09-04 NOTE — Telephone Encounter (Signed)
Pt informed and states she will call Tuesday to make appt.  Meyer Cory, LPN

## 2012-09-04 NOTE — Telephone Encounter (Signed)
Pt calls and states she is having bad neck pain and it is radiated into both arms now. Do you want to see her or would you like to give her options on what to do?

## 2012-09-04 NOTE — Telephone Encounter (Signed)
I will let a prednisone taper as well as tramadol. She needs to come see me at some point next week.

## 2012-09-22 ENCOUNTER — Encounter: Payer: Self-pay | Admitting: Sports Medicine

## 2012-09-22 ENCOUNTER — Ambulatory Visit (INDEPENDENT_AMBULATORY_CARE_PROVIDER_SITE_OTHER): Payer: 59 | Admitting: Sports Medicine

## 2012-09-22 ENCOUNTER — Ambulatory Visit (HOSPITAL_BASED_OUTPATIENT_CLINIC_OR_DEPARTMENT_OTHER): Payer: 59

## 2012-09-22 VITALS — BP 122/84 | HR 93 | Wt 251.0 lb

## 2012-09-22 DIAGNOSIS — E669 Obesity, unspecified: Secondary | ICD-10-CM

## 2012-09-22 DIAGNOSIS — M542 Cervicalgia: Secondary | ICD-10-CM

## 2012-09-22 MED ORDER — PHENTERMINE HCL 37.5 MG PO CAPS: 37.5000 mg | ORAL_CAPSULE | ORAL | Status: DC

## 2012-09-22 NOTE — Progress Notes (Signed)
  Subjective:    CC: Followup  HPI: Obesity: 9 pound weight loss since last visit. No issues with the medication.  Neck pain: Clinically represent the left C7 versus C8 radiculitis, she has had x-rays which show multilevel degenerative disc disease, and home physical therapy.  Overall no better. He continues to radiate down the left arm, moderate, stable.  Past medical history, Surgical history, Family history not pertinant except as noted below, Social history, Allergies, and medications have been entered into the medical record, reviewed, and no changes needed.   Review of Systems: No fevers, chills, night sweats, weight loss, chest pain, or shortness of breath.   Objective:    General: Well Developed, well nourished, and in no acute distress.  Neuro: Alert and oriented x3, extra-ocular muscles intact, sensation grossly intact.  HEENT: Normocephalic, atraumatic, pupils equal round reactive to light, neck supple, no masses, no lymphadenopathy, thyroid nonpalpable.  Skin: Warm and dry, no rashes. Cardiac: Regular rate and rhythm, no murmurs rubs or gallops, no lower extremity edema.  Respiratory: Clear to auscultation bilaterally. Not using accessory muscles, speaking in full sentences. Impression and Recommendations:

## 2012-09-22 NOTE — Assessment & Plan Note (Signed)
There is multilevel degenerative disc disease. Symptoms continue to represent a left C7 versus C8 radiculitis. I'm going to obtain an MRI, she will come back to go over results and for interventional injection planning.

## 2012-09-22 NOTE — Assessment & Plan Note (Signed)
8 pound weight loss, refilling phentermine. Return in one month for weight check and refill.

## 2012-09-28 ENCOUNTER — Encounter: Payer: 59 | Attending: Sports Medicine | Admitting: *Deleted

## 2012-09-28 ENCOUNTER — Encounter: Payer: Self-pay | Admitting: *Deleted

## 2012-09-28 VITALS — Ht 64.0 in | Wt 251.0 lb

## 2012-09-28 DIAGNOSIS — E669 Obesity, unspecified: Secondary | ICD-10-CM | POA: Insufficient documentation

## 2012-09-28 DIAGNOSIS — Z713 Dietary counseling and surveillance: Secondary | ICD-10-CM | POA: Insufficient documentation

## 2012-09-28 NOTE — Progress Notes (Signed)
Medical Nutrition Therapy:  Appt start time: 1030 end time:  1130.  Assessment:  Primary concern today: Obesity. Patient with history of appendix cancer. She had her gallbladder and right hemicholectomy in October 2013. She reports that since then, she has had digestive issues (cramping, diarrhea). Her food intake is limited due to this, and she states that her diet is very bland and monotonous. She cannot eat raw vegetables and fruits. She has been trying to lose weight for the last month with phentermine, and has lost about 9 pounds. She does reports that she gets full fast, and portion size is not very big. She is concerned about getting adequate nutrition. She doesn't currently exercise, but is looking into a gym membership.   MEDICATIONS: Dulera, Protonix, phentermine, levothyroxine    DIETARY INTAKE:   Usual eating pattern includes 3 meals and 1 snacks per day.  24-hr recall:  B ( AM): 2 boiled eggs, toast, sausage sometimes, diet Dr. Reino Kent  Snk ( AM): None  L ( PM): Grilled chicken, pasta, diet Dr. Reino Kent Snk ( PM): Crackers, chips D ( PM): Chicken casserole , potatoes OR spaghetti with small amt sauce, cheese, diet Dr. Reino Kent Snk ( PM): None usually  Beverages: Diet Dr. Reino Kent, coffee (<8 oz)  Usual physical activity: None  Estimated energy needs: 1800 calories 225 g carbohydrates 113 g protein 50 g fat  Progress Towards Goal(s):  In progress.   Nutritional Diagnosis:  [place]-3.3 Overweight/obesity As related to history of excessive energy intake.  As evidenced by BMI 43.1.    Intervention:  Nutrition counseling. We discussed nutrition for healthy, slow weight loss. We discussed the importance of getting adequate protein, portion control, and balancing nutrients. We also discussed low fiber fruits and vegetables and seasoning techniques to try to add variety to diet.   Goals: 1. 1 pound weight loss per week.  2. Include a good source of protein with all meals.  3. Include 2-3  healthy snacks daily (fruit, PB with crackers, string cheese, yogurt, cottage cheese) 4.  Start to incorporate small portions (1/4 cup) of low fiber canned fruits and vegetables (squash, zucchini, cucumber, peppers, green beans). Monitor for signs of intolerance.  5. Exercise at gym 4 days per week for at least 1 hour (patient reports desire to go for 1.5-2 hours at a time).  Handouts given during visit include:  Weight loss tips  Yellow meal plan card  Monitoring/Evaluation:  Dietary intake, exercise, and body weight in 1 month(s).

## 2012-10-06 ENCOUNTER — Ambulatory Visit (HOSPITAL_BASED_OUTPATIENT_CLINIC_OR_DEPARTMENT_OTHER)
Admission: RE | Admit: 2012-10-06 | Discharge: 2012-10-06 | Disposition: A | Discharge status: Home / Self Care | Payer: 59 | Source: Ambulatory Visit | Attending: Sports Medicine | Admitting: Sports Medicine

## 2012-10-06 DIAGNOSIS — M542 Cervicalgia: Secondary | ICD-10-CM | POA: Insufficient documentation

## 2012-10-06 DIAGNOSIS — M4802 Spinal stenosis, cervical region: Secondary | ICD-10-CM | POA: Insufficient documentation

## 2012-10-06 DIAGNOSIS — M5412 Radiculopathy, cervical region: Secondary | ICD-10-CM | POA: Insufficient documentation

## 2012-10-06 DIAGNOSIS — M5380 Other specified dorsopathies, site unspecified: Secondary | ICD-10-CM | POA: Insufficient documentation

## 2012-10-07 ENCOUNTER — Telehealth: Payer: Self-pay

## 2012-10-07 NOTE — Telephone Encounter (Signed)
Pt called wanted to know what her results were for MRI of neck.

## 2012-10-08 NOTE — Telephone Encounter (Signed)
1. Leftward disc osteophyte complex at C6-7 with mild left central  and foraminal stenosis. Minimal right foraminal narrowing is  present.  2. Minimal soft disc bulging at C5-6 without significant stenosis.  Should keep her MRI results appt to find out exactly what this means though :-)

## 2012-10-08 NOTE — Telephone Encounter (Signed)
Pt informed.  Perseus Westall, LPN  

## 2012-10-12 ENCOUNTER — Ambulatory Visit: Payer: Self-pay | Admitting: Sports Medicine

## 2012-10-20 ENCOUNTER — Encounter: Payer: Self-pay | Admitting: Sports Medicine

## 2012-10-20 ENCOUNTER — Ambulatory Visit (INDEPENDENT_AMBULATORY_CARE_PROVIDER_SITE_OTHER): Payer: 59 | Admitting: Sports Medicine

## 2012-10-20 VITALS — BP 128/90 | HR 86 | Wt 251.0 lb

## 2012-10-20 DIAGNOSIS — G43909 Migraine, unspecified, not intractable, without status migrainosus: Secondary | ICD-10-CM

## 2012-10-20 DIAGNOSIS — M5412 Radiculopathy, cervical region: Secondary | ICD-10-CM

## 2012-10-20 DIAGNOSIS — M5020 Other cervical disc displacement, unspecified cervical region: Secondary | ICD-10-CM

## 2012-10-20 MED ORDER — HYDROCODONE-ACETAMINOPHEN 5-325 MG PO TABS: 1.0000 | ORAL_TABLET | Freq: Three times a day (TID) | ORAL | Status: DC | PRN

## 2012-10-20 NOTE — Assessment & Plan Note (Signed)
MRI confirms left C6/7 disc protrusion with there is foraminal stenosis on the left as well as mild central canal stenosis. We have already been through therapy, steroids, oral medications, at this point we're to proceed with interventional treatment. I'm going to order a left-sided C6-C7 interlaminar epidural steroid injection. Like to see Deaisha a few weeks after the injection to go over response.

## 2012-10-20 NOTE — Progress Notes (Signed)
  Subjective:    CC: Follow  HPI: [name] comes back for followup of her left-sided cervical radiculitis, symptoms are persistent despite physical therapy, steroids, NSAIDs, gabapentin. She had an MRI the results of which will be dictated below. Symptoms are moderate, persistent, radiating down the left arm in a C7 distribution.  Past medical history, Surgical history, Family history not pertinant except as noted below, Social history, Allergies, and medications have been entered into the medical record, reviewed, and no changes needed.   Review of Systems: No fevers, chills, night sweats, weight loss, chest pain, or shortness of breath.   Objective:    General: Well Developed, well nourished, and in no acute distress.  Neuro: Alert and oriented x3, extra-ocular muscles intact, sensation grossly intact.  HEENT: Normocephalic, atraumatic, pupils equal round reactive to light, neck supple, no masses, no lymphadenopathy, thyroid nonpalpable.  Skin: Warm and dry, no rashes. Cardiac: Regular rate and rhythm, no murmurs rubs or gallops, no lower extremity edema.  Respiratory: Clear to auscultation bilaterally. Not using accessory muscles, speaking in full sentences.  MRI was reviewed and shows a C5-6 as well as a C6-7 disc protrusion, larger at the C6-7 level with extension into the left C6-C7 neural foramen.  Impression and Recommendations:

## 2012-10-27 ENCOUNTER — Ambulatory Visit (INDEPENDENT_AMBULATORY_CARE_PROVIDER_SITE_OTHER): Payer: 59 | Admitting: Sports Medicine

## 2012-10-27 ENCOUNTER — Encounter: Payer: Self-pay | Admitting: Sports Medicine

## 2012-10-27 VITALS — BP 132/90 | HR 89 | Wt 244.0 lb

## 2012-10-27 DIAGNOSIS — E669 Obesity, unspecified: Secondary | ICD-10-CM

## 2012-10-27 DIAGNOSIS — M47812 Spondylosis without myelopathy or radiculopathy, cervical region: Secondary | ICD-10-CM

## 2012-10-27 MED ORDER — PHENTERMINE HCL 37.5 MG PO CAPS: 37.5000 mg | ORAL_CAPSULE | ORAL | Status: DC

## 2012-10-27 NOTE — Progress Notes (Signed)
  Subjective:    CC: Followup  HPI: Obesity: 7 pound weight loss since the last visit.  Cervical radiculitis: Left C7, as epidural scheduled for tomorrow, pain is moderate, persistent. Radiates down left arm.  Past medical history, Surgical history, Family history not pertinant except as noted below, Social history, Allergies, and medications have been entered into the medical record, reviewed, and no changes needed.   Review of Systems: No fevers, chills, night sweats, weight loss, chest pain, or shortness of breath.   Objective:    General: Well Developed, well nourished, and in no acute distress.  Neuro: Alert and oriented x3, extra-ocular muscles intact, sensation grossly intact.  HEENT: Normocephalic, atraumatic, pupils equal round reactive to light, neck supple, no masses, no lymphadenopathy, thyroid nonpalpable.  Skin: Warm and dry, no rashes. Cardiac: Regular rate and rhythm, no murmurs rubs or gallops, no lower extremity edema.  Respiratory: Clear to auscultation bilaterally. Not using accessory muscles, speaking in full sentences.  Impression and Recommendations:

## 2012-10-27 NOTE — Assessment & Plan Note (Signed)
Epidural injection is scheduled for tomorrow, she will return in one month to go over response.

## 2012-10-27 NOTE — Assessment & Plan Note (Signed)
7 pound weight loss since starting medication. Refilling, continue diet and exercise prescription. Return in one month for weight check and refills.

## 2012-10-28 ENCOUNTER — Ambulatory Visit
Admission: RE | Admit: 2012-10-28 | Discharge: 2012-10-28 | Disposition: A | Discharge status: Home / Self Care | Payer: 59 | Source: Ambulatory Visit | Attending: Sports Medicine | Admitting: Sports Medicine

## 2012-10-28 VITALS — BP 128/70 | HR 79

## 2012-10-28 DIAGNOSIS — M47812 Spondylosis without myelopathy or radiculopathy, cervical region: Secondary | ICD-10-CM

## 2012-10-28 MED ORDER — TRIAMCINOLONE ACETONIDE 40 MG/ML IJ SUSP (RADIOLOGY)
60.0000 mg | Freq: Once | INTRAMUSCULAR | Status: AC
  Administered 2012-10-28: 60 mg via EPIDURAL

## 2012-10-28 MED ORDER — DIAZEPAM 5 MG PO TABS
5.0000 mg | ORAL_TABLET | Freq: Once | ORAL | Status: AC
  Administered 2012-10-28: 5 mg via ORAL

## 2012-10-28 MED ORDER — IOHEXOL 300 MG/ML  SOLN
1.0000 mL | Freq: Once | INTRAMUSCULAR | Status: AC | PRN
  Administered 2012-10-28: 1 mL via INTRAVENOUS

## 2012-10-29 ENCOUNTER — Ambulatory Visit: Payer: Self-pay | Admitting: *Deleted

## 2012-10-30 ENCOUNTER — Other Ambulatory Visit: Payer: Self-pay | Admitting: Sports Medicine

## 2012-11-02 IMAGING — CR DG CHEST 2V
2 series · 2 of 2 positions shown · non-contrast
Comparison: 01/12/2011

CLINICAL DATA: Cough, shortness of breath, asthma.

CHEST - 2 VIEW

[view not recorded (1 of 2)]
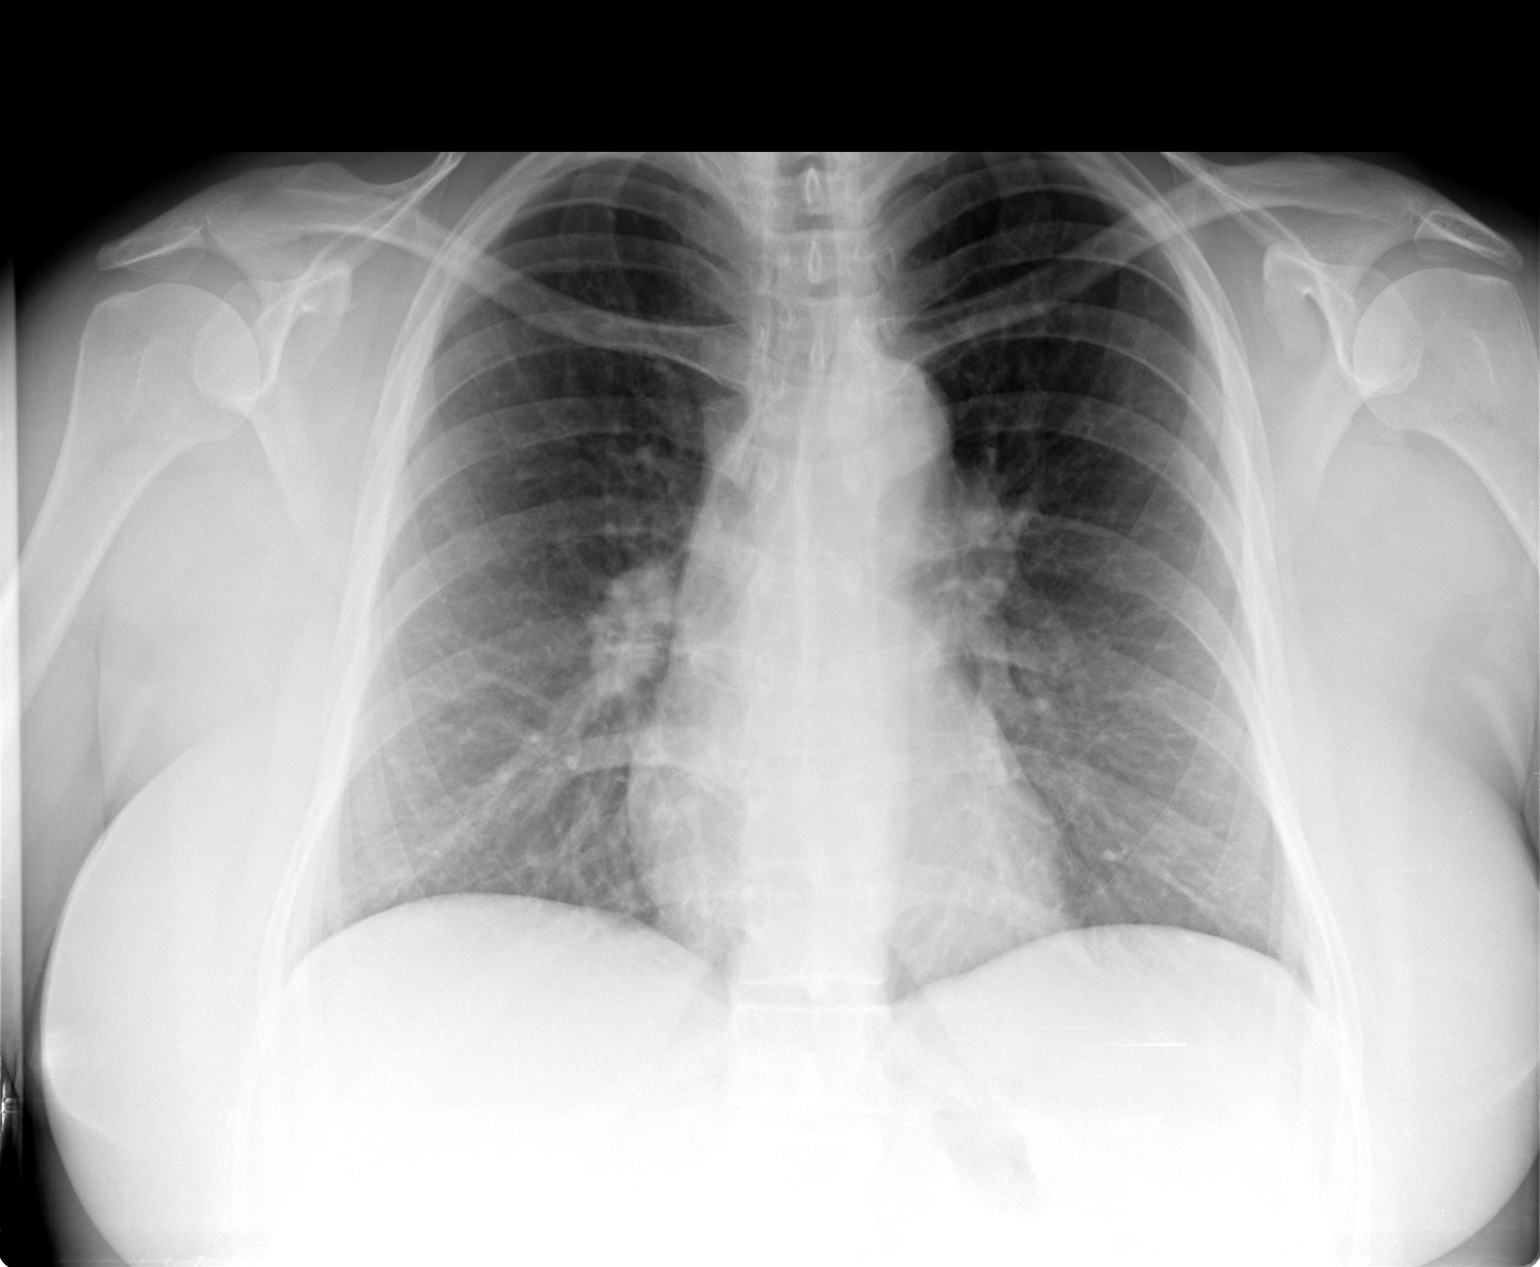

[view not recorded (2 of 2)]
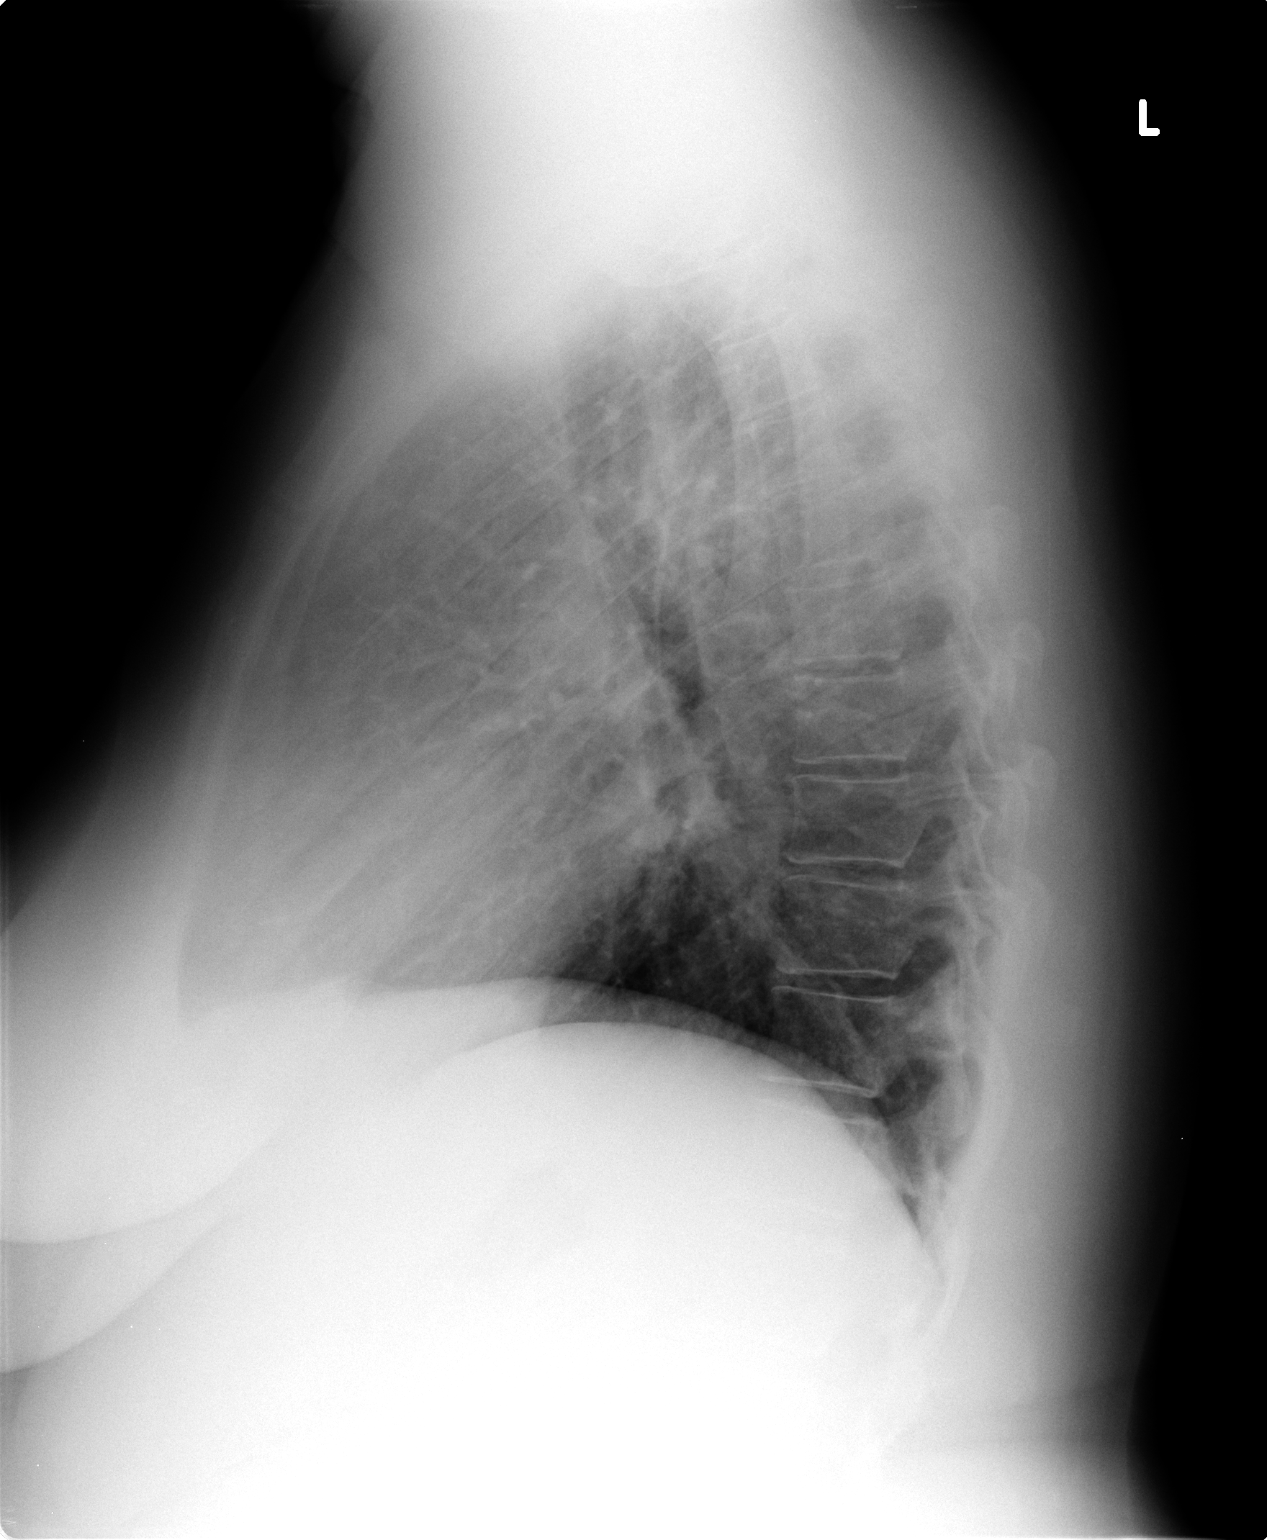

[2 of 2 positions shown; findings below may reference images not displayed]

FINDINGS: Heart and mediastinal contours are within normal limits.
No focal opacities or effusions.  No acute bony abnormality.
IMPRESSION: No active cardiopulmonary disease.

## 2012-11-17 ENCOUNTER — Ambulatory Visit (INDEPENDENT_AMBULATORY_CARE_PROVIDER_SITE_OTHER): Payer: 59 | Admitting: Sports Medicine

## 2012-11-17 ENCOUNTER — Encounter: Payer: Self-pay | Admitting: Sports Medicine

## 2012-11-17 VITALS — BP 122/86 | HR 79 | Wt 238.0 lb

## 2012-11-17 DIAGNOSIS — J069 Acute upper respiratory infection, unspecified: Secondary | ICD-10-CM

## 2012-11-17 MED ORDER — HYDROCODONE-HOMATROPINE 5-1.5 MG/5ML PO SYRP: 5.0000 mL | ORAL_SOLUTION | Freq: Four times a day (QID) | ORAL | Status: DC | PRN

## 2012-11-17 NOTE — Patient Instructions (Addendum)
Infectious Mononucleosis  Infectious mononucleosis (mono) is a common germ (viral) infection in children, teenagers, and young adults.   CAUSES   Mono is an infection caused by the Epstein Barr virus. The virus is spread by close personal contact with someone who has the infection. It can be passed by contact with your saliva through things such as kissing or sharing drinking glasses. Sometimes, the infection can be spread from someone who does not appear sick but still spreads the virus (asymptomatic carrier state).   SYMPTOMS   The most common symptoms of Mono are:   Sore throat.   Headache.   Fatigue.   Muscle aches.   Swollen glands.   Fever.   Poor appetite.   Enlarged liver or spleen.  The less common symptoms can include:   Rash.   Feeling sick to your stomach (nauseous).   Abdominal pain.  DIAGNOSIS   Mono is diagnosed by a blood test.   TREATMENT   Treatment of mono is usually at home. There is no medicine that cures this virus. Sometimes hospital treatment is needed in severe cases. Steroid medicine sometimes is needed if the swelling in the throat causes breathing or swallowing problems.   HOME CARE INSTRUCTIONS    Drink enough fluids to keep your urine clear or pale yellow.   Eat soft foods. Cool foods like popsicles or ice cream can soothe a sore throat.   Only take over-the-counter or prescription medicines for pain, discomfort, or fever as directed by your caregiver. Children under 18 years of age should not take aspirin.   Gargle salt water. This may help relieve your sore throat. Put 1 teaspoon (tsp) of salt in 1 cup of warm water. Sucking on hard candy may also help.   Rest as needed.   Start regular activities gradually after the fever is gone. Be sure to rest when tired.   Avoid strenuous exercise or contact sports until your caregiver says it is okay. The liver and spleen could be seriously injured.   Avoid sharing drinking glasses or kissing until your caregiver tells you  that you are no longer contagious.  SEEK MEDICAL CARE IF:    Your fever is not gone after 7 days.   Your activity level is not back to normal after 2 weeks.   You have yellow coloring to eyes and skin (jaundice).  SEEK IMMEDIATE MEDICAL CARE IF:    You have severe pain in the abdomen or shoulder.   You have trouble swallowing or drooling.   You have trouble breathing.   You develop a stiff neck.   You develop a severe headache.   You cannot stop throwing up (vomiting).   You have convulsions.   You are confused.   You have trouble with balance.   You develop signs of body fluid loss (dehydration):   Weakness.   Sunken eyes.   Pale skin.   Dry mouth.   Rapid breathing or pulse.  MAKE SURE YOU:    Understand these instructions.   Will watch your condition.   Will get help right away if you are not doing well or get worse.  Document Released: 03/29/2000 Document Revised: 06/24/2011 Document Reviewed: 01/26/2008  [employer] Patient Information 2014 [employer].

## 2012-11-17 NOTE — Assessment & Plan Note (Signed)
With exposure to infectious mononucleosis as well as typical symptoms, as well as left upper quadrant pain she is likely experiencing a mononucleosis syndrome. Treatment is symptomatic, Tylenol 3 times a day, 650 mg. Hycodan. Checking Epstein-Barr virus IgG and IgM.

## 2012-11-17 NOTE — Progress Notes (Signed)
  Subjective:    CC: Sick  HPI: [name] has had several days of muscle aches, body aches, subjective fevers and chills, runny nose, left upper quadrant pain. Mild cough that is nonproductive. She was exposed to Epstein-Barr virus, and once she has the same symptoms. Symptoms are moderate, persistent.  Past medical history, Surgical history, Family history not pertinant except as noted below, Social history, Allergies, and medications have been entered into the medical record, reviewed, and no changes needed.   Review of Systems: No fevers, chills, night sweats, weight loss, chest pain, or shortness of breath.   Objective:    General: Well Developed, well nourished, and in no acute distress.  Neuro: Alert and oriented x3, extra-ocular muscles intact, sensation grossly intact.  HEENT: Normocephalic, atraumatic, pupils equal round reactive to light, neck supple, no masses, no lymphadenopathy, thyroid nonpalpable.  Skin: Warm and dry, no rashes. Cardiac: Regular rate and rhythm, no murmurs rubs or gallops, no lower extremity edema.  Respiratory: Clear to auscultation bilaterally. Not using accessory muscles, speaking in full sentences. Abdomen: Mildly tender to palpation in the left upper quadrant. No palpable masses.  Impression and Recommendations:

## 2012-11-18 LAB — EPSTEIN-BARR VIRUS VCA ANTIBODY PANEL
EBV EA IgG: 28.1 U/mL — ABNORMAL HIGH (ref <9.0)
EBV NA IgG: 487 U/mL — ABNORMAL HIGH (ref <18.0)
EBV VCA IgG: 234 U/mL — ABNORMAL HIGH (ref <18.0)
EBV VCA IgM: <10 U/mL (ref <36.0)

## 2012-11-25 ENCOUNTER — Ambulatory Visit: Payer: Self-pay | Admitting: Sports Medicine

## 2012-11-25 DIAGNOSIS — Z0289 Encounter for other administrative examinations: Secondary | ICD-10-CM

## 2012-12-09 ENCOUNTER — Encounter: Payer: Self-pay | Admitting: Sports Medicine

## 2012-12-09 ENCOUNTER — Ambulatory Visit (INDEPENDENT_AMBULATORY_CARE_PROVIDER_SITE_OTHER): Payer: 59 | Admitting: Sports Medicine

## 2012-12-09 VITALS — Wt 235.0 lb

## 2012-12-09 DIAGNOSIS — E669 Obesity, unspecified: Secondary | ICD-10-CM

## 2012-12-09 DIAGNOSIS — J069 Acute upper respiratory infection, unspecified: Secondary | ICD-10-CM

## 2012-12-09 MED ORDER — HYDROCODONE-HOMATROPINE 5-1.5 MG/5ML PO SYRP: 5.0000 mL | ORAL_SOLUTION | Freq: Four times a day (QID) | ORAL | Status: DC | PRN

## 2012-12-09 MED ORDER — PHENTERMINE HCL 37.5 MG PO CAPS: 37.5000 mg | ORAL_CAPSULE | ORAL | Status: DC

## 2012-12-09 NOTE — Assessment & Plan Note (Signed)
Refilling her 3 months. Return in 3 months.

## 2012-12-09 NOTE — Assessment & Plan Note (Signed)
Infectious mononucleosis, serology confirmed. Refilling Hycodan. I do expect another 3 weeks of symptoms.

## 2012-12-09 NOTE — Progress Notes (Signed)
  Subjective:    CC: Followup  HPI: Infectious mononucleosis: confirmed with serologic studies, unfortunately she's continued to have fatigue, and pharyngitis. She also has a mild cough, this is improving only slowly. 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 Symptoms are mild, persistent but improving slightly, present for approximately 3 weeks now. She did have some mild left upper quadrant abdominal pain about a week ago which has since resolved.  Past medical history, Surgical history, Family history not pertinant except as noted below, Social history, Allergies, and medications have been entered into the medical record, reviewed, and no changes needed.   Review of Systems: No fevers, chills, night sweats, weight loss, chest pain, or shortness of breath.   Objective:    General: Well Developed, well nourished, and in no acute distress.  Neuro: Alert and oriented x3, extra-ocular muscles intact, sensation grossly intact.  HEENT: Normocephalic, atraumatic, pupils equal round reactive to light, neck supple, no masses, no lymphadenopathy, thyroid nonpalpable. Oropharynx is unremarkable to inspection, no palpable lymphadenopathy, no tonsillar exudates.  Skin: Warm and dry, no rashes. Cardiac: Regular rate and rhythm, no murmurs rubs or gallops, no lower extremity edema.  Respiratory: Clear to auscultation bilaterally. Not using accessory muscles, speaking in full sentences. Impression and Recommendations:

## 2013-01-08 ENCOUNTER — Encounter: Payer: Self-pay | Admitting: Sports Medicine

## 2013-01-08 ENCOUNTER — Ambulatory Visit (INDEPENDENT_AMBULATORY_CARE_PROVIDER_SITE_OTHER): Payer: 59 | Admitting: Sports Medicine

## 2013-01-08 VITALS — BP 131/91 | HR 95 | Wt 236.0 lb

## 2013-01-08 DIAGNOSIS — J069 Acute upper respiratory infection, unspecified: Secondary | ICD-10-CM

## 2013-01-08 MED ORDER — HYDROCODONE-HOMATROPINE 5-1.5 MG/5ML PO SYRP: 5.0000 mL | ORAL_SOLUTION | Freq: Four times a day (QID) | ORAL | Status: DC | PRN

## 2013-01-08 NOTE — Assessment & Plan Note (Signed)
Children recently came home with upper respiratory symptoms. She now has similar symptoms. Needs refill on Hycodan, strep swab negative. Return as needed, PFTs pre-and postbronchodilator have been negative in the recent past. Considering her frequent upper respiratory infections I will consider referral to an immunologist in the near future for recurrent episodes.

## 2013-01-08 NOTE — Progress Notes (Signed)
  Subjective:    CC: Cough, sick.  HPI: This is a pleasant 42 year old female, she just over infectious mononucleosis. Her daughter recently came back sick, and now she has another cough, mild body aches, mild sore throat. Cough is nonproductive but it is keeping her up at night. She does have some albuterol, which is mildly affected. She also recently had pulmonary function testing pre-and post bronchodilator, she tells me this was negative. Symptoms are moderate, persistent. No GI symptoms, no rash, very little sinus symptoms.  Past medical history, Surgical history, Family history not pertinant except as noted below, Social history, Allergies, and medications have been entered into the medical record, reviewed, and no changes needed.   Review of Systems: No fevers, chills, night sweats, weight loss, chest pain, or shortness of breath.   Objective:    General: Well Developed, well nourished, and in no acute distress.  Neuro: Alert and oriented x3, extra-ocular muscles intact, sensation grossly intact.  HEENT: Normocephalic, atraumatic, pupils equal round reactive to light, neck supple, no masses, no lymphadenopathy, thyroid nonpalpable.  Skin: Warm and dry, no rashes. Cardiac: Regular rate and rhythm, no murmurs rubs or gallops, no lower extremity edema.  Respiratory: Clear to auscultation bilaterally. Not using accessory muscles, speaking in full sentences.  Impression and Recommendations:

## 2013-02-18 ENCOUNTER — Other Ambulatory Visit: Payer: Self-pay

## 2013-02-25 ENCOUNTER — Other Ambulatory Visit: Payer: Self-pay | Admitting: *Deleted

## 2013-02-25 DIAGNOSIS — J45909 Unspecified asthma, uncomplicated: Secondary | ICD-10-CM

## 2013-02-25 MED ORDER — ALBUTEROL SULFATE HFA 108 (90 BASE) MCG/ACT IN AERS: 2.0000 | INHALATION_SPRAY | RESPIRATORY_TRACT | Status: DC | PRN

## 2013-02-26 ENCOUNTER — Other Ambulatory Visit: Payer: Self-pay | Admitting: Sports Medicine

## 2013-02-26 ENCOUNTER — Other Ambulatory Visit: Payer: Self-pay

## 2013-02-26 MED ORDER — ZOLPIDEM TARTRATE 10 MG PO TABS: 5.0000 mg | ORAL_TABLET | Freq: Every evening | ORAL | Status: DC | PRN

## 2013-03-05 ENCOUNTER — Ambulatory Visit (INDEPENDENT_AMBULATORY_CARE_PROVIDER_SITE_OTHER): Payer: 59 | Admitting: Physician Assistant

## 2013-03-05 ENCOUNTER — Encounter: Payer: Self-pay | Admitting: Physician Assistant

## 2013-03-05 VITALS — BP 145/87 | HR 94 | Wt 230.0 lb

## 2013-03-05 DIAGNOSIS — R05 Cough: Secondary | ICD-10-CM

## 2013-03-05 DIAGNOSIS — J069 Acute upper respiratory infection, unspecified: Secondary | ICD-10-CM

## 2013-03-05 MED ORDER — PREDNISONE 20 MG PO TABS: ORAL_TABLET | ORAL | Status: DC

## 2013-03-05 MED ORDER — AMOXICILLIN 500 MG PO TABS: 500.0000 mg | ORAL_TABLET | Freq: Two times a day (BID) | ORAL | Status: DC

## 2013-03-05 MED ORDER — HYDROCODONE-HOMATROPINE 5-1.5 MG/5ML PO SYRP: 5.0000 mL | ORAL_SOLUTION | Freq: Four times a day (QID) | ORAL | Status: DC | PRN

## 2013-03-05 NOTE — Progress Notes (Signed)
  Subjective:    Patient ID: [name], female    DOB: 28-Jun-1970, 42 y.o.   MRN: [id number]  HPI Patient is a 42 year old white female who presents to the clinic with dry cough and sore throat. Patient has had the symptoms for the last 3 days. She does report a low-grade fever of around 100. 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 She has been a little short of breath but she is taking her to let her regularly. She admits both ears a and she does have some sinus pressure. She has tried over-the-counter Mucinex and ibuprofen with no relief. She does admit that her kids are also sick with the same symptoms.    Review of Systems     Objective:   Physical Exam  Constitutional: She is oriented to person, place, and time. She appears well-developed and well-nourished.  HENT:  Head: Normocephalic and atraumatic.  Right Ear: External ear normal.  Left Ear: External ear normal.  TMs clear bilaterally. Negative for maxillary or frontal sinus tenderness.  Oropharynx erythematous with bilateral swollen tonsils 2+. No exudate present.  Bilateral turbinates swollen and red with nasal discharge.  Eyes: Conjunctivae are normal. Right eye exhibits no discharge. Left eye exhibits no discharge.  Neck: Normal range of motion. Neck supple.  Bilateral cervical adenopathy.  Cardiovascular: Normal rate, regular rhythm and normal heart sounds.   Pulmonary/Chest: Effort normal and breath sounds normal. She has no wheezes.  Neurological: She is alert and oriented to person, place, and time.  Skin: Skin is warm and dry.  Psychiatric: She has a normal mood and affect. Her behavior is normal.          Assessment & Plan:  Viral acute pharyngitis/cough-discuss with patient symptoms appear viral. Patient has had multiple strep test done in office and were negative. Patient's tonsils are significantly swollen. I did give her a prednisone for 5 days with Hycodan cough syrup to use at  night. Patient was encouraged to continue symptomatic care. If not improving in the next 3 days she can consider using Amoxil given to her for 10 days for development of bacterial infection.

## 2013-03-15 ENCOUNTER — Telehealth: Payer: Self-pay | Admitting: *Deleted

## 2013-03-15 ENCOUNTER — Ambulatory Visit: Payer: 59 | Admitting: Sports Medicine

## 2013-03-15 DIAGNOSIS — J069 Acute upper respiratory infection, unspecified: Secondary | ICD-10-CM

## 2013-03-15 MED ORDER — HYDROCODONE-HOMATROPINE 5-1.5 MG/5ML PO SYRP: 5.0000 mL | ORAL_SOLUTION | Freq: Four times a day (QID) | ORAL | Status: DC | PRN

## 2013-03-15 NOTE — Telephone Encounter (Signed)
Pt informed.  Leslie Spratlin, LPN  

## 2013-03-15 NOTE — Telephone Encounter (Signed)
Pt has called requesting a refill on cough medication. States she had an appt with Dr. Karie Schwalbe today but had to cancel due to jury duty and states she is rescheduled for Thursday. Please advise.  Meyer Cory, LPN

## 2013-03-15 NOTE — Telephone Encounter (Signed)
Yes, hycodan is ok to refill. She does need to keep appt with Dr. Karie Schwalbe.

## 2013-03-18 ENCOUNTER — Encounter: Payer: Self-pay | Admitting: Sports Medicine

## 2013-03-18 ENCOUNTER — Ambulatory Visit (INDEPENDENT_AMBULATORY_CARE_PROVIDER_SITE_OTHER): Payer: 59 | Admitting: Sports Medicine

## 2013-03-18 VITALS — BP 116/81 | HR 75 | Wt 228.0 lb

## 2013-03-18 DIAGNOSIS — E039 Hypothyroidism, unspecified: Secondary | ICD-10-CM

## 2013-03-18 DIAGNOSIS — E669 Obesity, unspecified: Secondary | ICD-10-CM

## 2013-03-18 DIAGNOSIS — D509 Iron deficiency anemia, unspecified: Secondary | ICD-10-CM

## 2013-03-18 MED ORDER — TOPIRAMATE 50 MG PO TABS: ORAL_TABLET | ORAL | Status: DC

## 2013-03-18 MED ORDER — PHENTERMINE HCL 37.5 MG PO CAPS: 37.5000 mg | ORAL_CAPSULE | ORAL | Status: DC

## 2013-03-18 NOTE — Assessment & Plan Note (Signed)
Rechecking TSH 

## 2013-03-18 NOTE — Progress Notes (Signed)
  Subjective:    CC: Followup  HPI: Obesity: Approximately 50 pounds total weight loss so far, no side effects from the medication.  Anemia: Resolved, with iron supplementation, due for a recheck.  Hypothyroidism: Rechecking TSH, she is on maximal doses of levothyroxine.  Past medical history, Surgical history, Family history not pertinant except as noted below, Social history, Allergies, and medications have been entered into the medical record, reviewed, and no changes needed.   Review of Systems: No fevers, chills, night sweats, weight loss, chest pain, or shortness of breath.   Objective:    General: Well Developed, well nourished, and in no acute distress.  Neuro: Alert and oriented x3, extra-ocular muscles intact, sensation grossly intact.  HEENT: Normocephalic, atraumatic, pupils equal round reactive to light, neck supple, no masses, no lymphadenopathy, thyroid nonpalpable.  Skin: Warm and dry, no rashes. Cardiac: Regular rate and rhythm, no murmurs rubs or gallops, no lower extremity edema.  Respiratory: Clear to auscultation bilaterally. Not using accessory muscles, speaking in full sentences.  Impression and Recommendations:

## 2013-03-18 NOTE — Assessment & Plan Note (Signed)
Continued weight loss. Mala total of approximately 50 pounds. Refilling 3 months of phentermine and adding Topamax.

## 2013-03-18 NOTE — Assessment & Plan Note (Signed)
Anemia had resolved. Rechecking CBC.

## 2013-04-06 ENCOUNTER — Telehealth: Payer: Self-pay

## 2013-04-06 MED ORDER — PROMETHAZINE HCL 25 MG PO TABS: 25.0000 mg | ORAL_TABLET | Freq: Four times a day (QID) | ORAL | Status: DC | PRN

## 2013-04-06 NOTE — Telephone Encounter (Signed)
Called patient left message on vm  for patient that Phenergan has been sent to pharmacy. Chanteria Haggard,CMA

## 2013-04-06 NOTE — Telephone Encounter (Signed)
Done

## 2013-04-06 NOTE — Telephone Encounter (Signed)
Patient called stated that she has had a stomach virus for 2 days now she wants to know if she can have some phenergan called in to her pharmacy. She stated that she will come in if she has too but she did not want to spread it. Zaccary Creech,CMA

## 2013-04-21 ENCOUNTER — Telehealth: Payer: Self-pay

## 2013-04-21 NOTE — Telephone Encounter (Signed)
Patient request to go back on the Symbicort 160 mcg because it is affordable. She needs a Rx to be sent  To [facility].  Lucyann Romano,CMA

## 2013-04-22 MED ORDER — BUDESONIDE-FORMOTEROL FUMARATE 160-4.5 MCG/ACT IN AERO: 2.0000 | INHALATION_SPRAY | Freq: Two times a day (BID) | RESPIRATORY_TRACT | Status: DC

## 2013-04-22 NOTE — Telephone Encounter (Signed)
Symbicort 160 mg

## 2013-05-12 ENCOUNTER — Encounter: Payer: Self-pay | Admitting: Sports Medicine

## 2013-05-12 ENCOUNTER — Ambulatory Visit (INDEPENDENT_AMBULATORY_CARE_PROVIDER_SITE_OTHER): Payer: Managed Care, Other (non HMO) | Admitting: Sports Medicine

## 2013-05-12 VITALS — BP 129/91 | HR 80 | Ht 64.0 in | Wt 218.0 lb

## 2013-05-12 DIAGNOSIS — J329 Chronic sinusitis, unspecified: Secondary | ICD-10-CM

## 2013-05-12 MED ORDER — BECLOMETHASONE DIPROP MONOHYD 42 MCG/SPRAY NA SUSP: 2.0000 | Freq: Two times a day (BID) | NASAL | Status: DC

## 2013-05-12 MED ORDER — HYDROCODONE-HOMATROPINE 5-1.5 MG/5ML PO SYRP: 5.0000 mL | ORAL_SOLUTION | Freq: Four times a day (QID) | ORAL | Status: DC | PRN

## 2013-05-12 NOTE — Assessment & Plan Note (Signed)
Unfortunately continues to have persistent upper respiratory symptoms. She did see ENT, we did recommend sinus surgery, she is not yet ready for this. We are going to treat her symptomatically, and I do not think she needs antibiotics at this time. We are going to switch to Beconase, she's going to take DayQuil over-the-counter and naproxen, I'm also going to provide some Hycodan syrup. Return to see me as needed.

## 2013-05-12 NOTE — Progress Notes (Signed)
  Subjective:    CC: URI  HPI: 1-2 weeks of cough, non productive, mild low grade temperatures, rhinorrhea.  No GI symptoms. Symptoms are moderate, persistent. She does have a history of chronic sinusitis, I did set her up with otolaryngology, and they did do a CT scan of her sinuses and have recommended sinus surgery. She has declined this for now as she is not ready for another operation. She does use Flonase, but feels it is now ineffective, she also needs some cough medicine. She is doing fairly well with Aleve occasionally.  Past medical history, Surgical history, Family history not pertinant except as noted below, Social history, Allergies, and medications have been entered into the medical record, reviewed, and no changes needed.   Review of Systems: No fevers, chills, night sweats, weight loss, chest pain, or shortness of breath.   Objective:    General: Well Developed, well nourished, and in no acute distress.  Neuro: Alert and oriented x3, extra-ocular muscles intact, sensation grossly intact.  HEENT: Normocephalic, atraumatic, pupils equal round reactive to light, neck supple, no masses, no lymphadenopathy, thyroid nonpalpable. Oropharynx and external ear canals are unremarkable, nasopharynx is boggy and erythematous turbinates. Skin: Warm and dry, no rashes. Cardiac: Regular rate and rhythm, no murmurs rubs or gallops, no lower extremity edema.  Respiratory: Clear to auscultation bilaterally. Not using accessory muscles, speaking in full sentences.  Impression and Recommendations:

## 2013-05-24 ENCOUNTER — Ambulatory Visit (INDEPENDENT_AMBULATORY_CARE_PROVIDER_SITE_OTHER): Payer: Managed Care, Other (non HMO) | Admitting: Sports Medicine

## 2013-05-24 ENCOUNTER — Encounter: Payer: Self-pay | Admitting: Sports Medicine

## 2013-05-24 VITALS — BP 137/95 | HR 87 | Ht 64.0 in | Wt 207.0 lb

## 2013-05-24 DIAGNOSIS — K047 Periapical abscess without sinus: Secondary | ICD-10-CM

## 2013-05-24 MED ORDER — AMOXICILLIN-POT CLAVULANATE 875-125 MG PO TABS: 1.0000 | ORAL_TABLET | Freq: Two times a day (BID) | ORAL | Status: DC

## 2013-05-24 MED ORDER — HYDROCODONE-ACETAMINOPHEN 7.5-325 MG PO TABS: 1.0000 | ORAL_TABLET | Freq: Three times a day (TID) | ORAL | Status: DC | PRN

## 2013-05-24 NOTE — Patient Instructions (Signed)
°  Dental Abscess °A dental abscess is a collection of infected fluid (pus) from a bacterial infection in the inner part of the tooth (pulp). It usually occurs at the end of the tooth's root.  °CAUSES  °· Severe tooth decay. °· Trauma to the tooth that allows bacteria to enter into the pulp, such as a broken or chipped tooth. °SYMPTOMS  °· Severe pain in and around the infected tooth. °· Swelling and redness around the abscessed tooth or in the mouth or face. °· Tenderness. °· Pus drainage. °· Bad breath. °· Bitter taste in the mouth. °· Difficulty swallowing. °· Difficulty opening the mouth. °· Nausea. °· Vomiting. °· Chills. °· Swollen neck glands. °DIAGNOSIS  °· A medical and dental history will be taken. °· An examination will be performed by tapping on the abscessed tooth. °· X-rays may be taken of the tooth to identify the abscess. °TREATMENT °The goal of treatment is to eliminate the infection. You may be prescribed antibiotic medicine to stop the infection from spreading. A root canal may be performed to save the tooth. If the tooth cannot be saved, it may be pulled (extracted) and the abscess may be drained.  °HOME CARE INSTRUCTIONS °· Only take over-the-counter or prescription medicines for pain, fever, or discomfort as directed by your caregiver. °· Rinse your mouth (gargle) often with salt water (¼ tsp salt in 8 oz [250 ml] of warm water) to relieve pain or swelling. °· Do not drive after taking pain medicine (narcotics). °· Do not apply heat to the outside of your face. °· Return to your dentist for further treatment as directed. °SEEK MEDICAL CARE IF: °· Your pain is not helped by medicine. °· Your pain is getting worse instead of better. °SEEK IMMEDIATE MEDICAL CARE IF: °· You have a fever or persistent symptoms for more than 2 3 days. °· You have a fever and your symptoms suddenly get worse. °· You have chills or a very bad headache. °· You have problems breathing or swallowing. °· You have trouble  opening your mouth. °· You have swelling in the neck or around the eye. °Document Released: 04/01/2005 Document Revised: 12/25/2011 Document Reviewed: 07/10/2010 °[employer]® Patient Information ©2014 [employer]. ° ° °

## 2013-05-24 NOTE — Progress Notes (Signed)
  Subjective:    CC: Tooth pain  HPI: For the past several days Timberlee has had severe pain which he localizes in the right second mandibular molar, radiates to the ear and into the neck, no constitutional symptoms, pain is severe, persistent. She cannot afford an oral surgeon at this point.  Past medical history, Surgical history, Family history not pertinant except as noted below, Social history, Allergies, and medications have been entered into the medical record, reviewed, and no changes needed.   Review of Systems: No fevers, chills, night sweats, weight loss, chest pain, or shortness of breath.   Objective:    General: Well Developed, well nourished, and in no acute distress.  Neuro: Alert and oriented x3, extra-ocular muscles intact, sensation grossly intact.  HEENT: Normocephalic, atraumatic, pupils equal round reactive to light, neck supple, no masses, no lymphadenopathy, thyroid nonpalpable. Oropharynx shows a necrotic tooth, right second mandibular molar. Skin: Warm and dry, no rashes. Cardiac: Regular rate and rhythm, no murmurs rubs or gallops, no lower extremity edema.  Respiratory: Clear to auscultation bilaterally. Not using accessory muscles, speaking in full sentences.  Impression and Recommendations:

## 2013-05-24 NOTE — Assessment & Plan Note (Signed)
She will make an appointment with an oral surgeon for extraction. Augmentin and hydrocodone in the meantime. Return as needed.

## 2013-05-25 ENCOUNTER — Encounter: Payer: Self-pay | Admitting: Sports Medicine

## 2013-05-31 ENCOUNTER — Other Ambulatory Visit: Payer: Self-pay | Admitting: Sports Medicine

## 2013-06-10 ENCOUNTER — Ambulatory Visit: Payer: Managed Care, Other (non HMO) | Admitting: Sports Medicine

## 2013-06-16 ENCOUNTER — Ambulatory Visit: Payer: Self-pay | Admitting: Sports Medicine

## 2013-06-16 DIAGNOSIS — Z0289 Encounter for other administrative examinations: Secondary | ICD-10-CM

## 2013-07-07 ENCOUNTER — Encounter: Payer: Self-pay | Admitting: Sports Medicine

## 2013-07-07 ENCOUNTER — Ambulatory Visit (INDEPENDENT_AMBULATORY_CARE_PROVIDER_SITE_OTHER): Payer: Managed Care, Other (non HMO) | Admitting: Sports Medicine

## 2013-07-07 VITALS — BP 132/93 | HR 86 | Wt 215.0 lb

## 2013-07-07 DIAGNOSIS — J329 Chronic sinusitis, unspecified: Secondary | ICD-10-CM

## 2013-07-07 DIAGNOSIS — M47812 Spondylosis without myelopathy or radiculopathy, cervical region: Secondary | ICD-10-CM

## 2013-07-07 DIAGNOSIS — E669 Obesity, unspecified: Secondary | ICD-10-CM

## 2013-07-07 MED ORDER — PHENTERMINE HCL 37.5 MG PO CAPS: 37.5000 mg | ORAL_CAPSULE | ORAL | Status: DC

## 2013-07-07 MED ORDER — AZITHROMYCIN 250 MG PO TABS: ORAL_TABLET | ORAL | Status: DC

## 2013-07-07 MED ORDER — HYDROCODONE-ACETAMINOPHEN 7.5-325 MG PO TABS: 1.0000 | ORAL_TABLET | Freq: Three times a day (TID) | ORAL | Status: DC | PRN

## 2013-07-07 NOTE — Assessment & Plan Note (Signed)
Refilling phentermine. Over 60 pounds weight loss or far. We will probably not refill any further after her next 3 months.

## 2013-07-07 NOTE — Assessment & Plan Note (Signed)
Continue nasal steroid, adding azithromycin, she is having recurrent sinusitis.

## 2013-07-07 NOTE — Progress Notes (Signed)
  Subjective:    CC: Followup and recurrent shoulder pain  HPI: Left shoulder pain: History of left cervical radiculopathy, this resolved for 7 months, unfortunately she is now starting to have a recurrence of pain in the left C6 distribution.  Obesity: Doing extremely well with phentermine, has lost greater than 60 pounds so far.  Acute sinusitis: This is an acute or chronic flare of maxillary sinusitis, pain over the maxillary sinuses with radiation to the upper teeth and ears, moderate, persistent, azithromycin is worthwhile for this in the past, continues to use nasal steroid.  Past medical history, Surgical history, Family history not pertinant except as noted below, Social history, Allergies, and medications have been entered into the medical record, reviewed, and no changes needed.   Review of Systems: No fevers, chills, night sweats, weight loss, chest pain, or shortness of breath.   Objective:    General: Well Developed, well nourished, and in no acute distress.  Neuro: Alert and oriented x3, extra-ocular muscles intact, sensation grossly intact.  HEENT: Normocephalic, atraumatic, pupils equal round reactive to light, neck supple, no masses, no lymphadenopathy, thyroid nonpalpable.  Skin: Warm and dry, no rashes. Cardiac: Regular rate and rhythm, no murmurs rubs or gallops, no lower extremity edema.  Respiratory: Clear to auscultation bilaterally. Not using accessory muscles, speaking in full sentences.  Impression and Recommendations:

## 2013-07-07 NOTE — Assessment & Plan Note (Signed)
Seven-month response to the last epidural. Repeating a left-sided epidural, this time C6-C7 level.

## 2013-07-12 ENCOUNTER — Ambulatory Visit
Admission: RE | Admit: 2013-07-12 | Discharge: 2013-07-12 | Disposition: A | Discharge status: Home / Self Care | Payer: Managed Care, Other (non HMO) | Source: Ambulatory Visit | Attending: Sports Medicine | Admitting: Sports Medicine

## 2013-07-12 MED ORDER — TRIAMCINOLONE ACETONIDE 40 MG/ML IJ SUSP (RADIOLOGY)
60.0000 mg | Freq: Once | INTRAMUSCULAR | Status: AC
  Administered 2013-07-12: 60 mg via EPIDURAL

## 2013-07-12 MED ORDER — IOHEXOL 300 MG/ML  SOLN
1.0000 mL | Freq: Once | INTRAMUSCULAR | Status: AC | PRN
  Administered 2013-07-12: 1 mL via EPIDURAL

## 2013-07-12 NOTE — Discharge Instructions (Signed)

## 2013-07-13 ENCOUNTER — Telehealth: Payer: Self-pay

## 2013-07-13 NOTE — Telephone Encounter (Signed)
Patient called stated that she needs a note to return back to work and if there are any restrictions she need to do  those included on the letter as well and she also stated that she has had her epidural injections done and she still does not feel any better. Ercelle Winkles,CMA

## 2013-07-14 ENCOUNTER — Encounter: Payer: Self-pay | Admitting: Sports Medicine

## 2013-07-14 NOTE — Telephone Encounter (Signed)
Patient has been informed as instructed below.Suanne Marker Dulce Martian,CMA

## 2013-07-14 NOTE — Telephone Encounter (Signed)
Note is in my box, also considering no response to injections, she likely becomes a surgical candidate, we can discuss this at a future visit if she would like.

## 2013-07-23 ENCOUNTER — Telehealth: Payer: Self-pay

## 2013-07-23 MED ORDER — TRAMADOL HCL 50 MG PO TABS: ORAL_TABLET | ORAL | Status: DC

## 2013-07-23 NOTE — Telephone Encounter (Signed)
Faxed to [facility] °

## 2013-07-23 NOTE — Telephone Encounter (Signed)
Neck pain has returned 2 weeks after epidural injections. She states she has Vicodin but it makes her feel funny. She would like to try tramadol. [facility] pharmacy.

## 2013-07-23 NOTE — Telephone Encounter (Signed)
Rx in box.  Probably time to at least discuss with spine surgery.

## 2013-08-13 ENCOUNTER — Telehealth: Payer: Self-pay | Admitting: *Deleted

## 2013-08-13 ENCOUNTER — Ambulatory Visit (INDEPENDENT_AMBULATORY_CARE_PROVIDER_SITE_OTHER): Payer: Managed Care, Other (non HMO) | Admitting: Sports Medicine

## 2013-08-13 ENCOUNTER — Ambulatory Visit (HOSPITAL_BASED_OUTPATIENT_CLINIC_OR_DEPARTMENT_OTHER)
Admission: RE | Admit: 2013-08-13 | Discharge: 2013-08-13 | Disposition: A | Discharge status: Home / Self Care | Payer: Managed Care, Other (non HMO) | Source: Ambulatory Visit | Attending: Sports Medicine | Admitting: Sports Medicine

## 2013-08-13 VITALS — BP 160/96 | HR 96

## 2013-08-13 DIAGNOSIS — R1110 Vomiting, unspecified: Secondary | ICD-10-CM | POA: Insufficient documentation

## 2013-08-13 DIAGNOSIS — Z8509 Personal history of malignant neoplasm of other digestive organs: Secondary | ICD-10-CM | POA: Insufficient documentation

## 2013-08-13 DIAGNOSIS — R109 Unspecified abdominal pain: Secondary | ICD-10-CM | POA: Insufficient documentation

## 2013-08-13 DIAGNOSIS — Z9049 Acquired absence of other specified parts of digestive tract: Secondary | ICD-10-CM | POA: Insufficient documentation

## 2013-08-13 DIAGNOSIS — R112 Nausea with vomiting, unspecified: Secondary | ICD-10-CM

## 2013-08-13 MED ORDER — IOHEXOL 300 MG/ML  SOLN
100.0000 mL | Freq: Once | INTRAMUSCULAR | Status: AC | PRN
  Administered 2013-08-13: 100 mL via INTRAVENOUS

## 2013-08-13 MED ORDER — PROMETHAZINE HCL 25 MG/ML IJ SOLN: 25.0000 mg | Freq: Once | INTRAMUSCULAR | Status: DC

## 2013-08-13 MED ORDER — PROMETHAZINE HCL 25 MG/ML IJ SOLN
25.0000 mg | Freq: Once | INTRAMUSCULAR | Status: AC
  Administered 2013-08-13: 25 mg via INTRAMUSCULAR

## 2013-08-13 NOTE — Telephone Encounter (Signed)
Pt informed and there is no PA required for scan. Informed pt we would call her with results.  Oscar La, LPN

## 2013-08-13 NOTE — Progress Notes (Signed)
   Subjective:    Patient ID: [name], female    DOB: 12/07/1970, 42 y.o.   MRN: [id number] Pt here to receive injection of Phenergan 25mg  due to vomiting and needing to drink contrast for scan. Injection given in [facility] with no complications. Oscar La, LPN  HPI    Review of Systems     Objective:   Physical Exam        Assessment & Plan:

## 2013-08-13 NOTE — Telephone Encounter (Signed)
Suspect partial bowel obstruction. She does need a CT abd/pelv with oral and IV contrast stat. Ordering this.

## 2013-08-13 NOTE — Telephone Encounter (Signed)
Pt states she is having hard BM and it is hard to pass gas. States she has vomited 1500cc this am with bile in it. Pt is being sent downstairs from home to get the  CT scan done.

## 2013-08-15 ENCOUNTER — Telehealth: Payer: Self-pay | Admitting: Sports Medicine

## 2013-08-15 NOTE — Telephone Encounter (Signed)
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  Ct abd and pelv with oral and iv contrast neg for acute process.  Advised bowel rest and antiemetics.  Called today to check on her, symptoms have resolved and she is starting clears and advancing diet slowly as tolerated (she is a Air traffic controller).  She will call back if any recurrence.

## 2013-11-26 ENCOUNTER — Telehealth: Payer: Self-pay | Admitting: Sports Medicine

## 2013-11-26 MED ORDER — ALBUTEROL SULFATE 108 (90 BASE) MCG/ACT IN AEPB: 2.0000 | INHALATION_SPRAY | RESPIRATORY_TRACT | Status: DC | PRN

## 2013-11-26 NOTE — Telephone Encounter (Signed)
Yes I have done this already.

## 2013-11-26 NOTE — Telephone Encounter (Signed)
[name] called. She no longer has insurance. Her pharmacy wants to change her inhaler to Respirdlick by Pro-air(not sure of spelling) because it is cheaper. Thank you.

## 2013-11-26 NOTE — Telephone Encounter (Signed)
Ms. Kercher called. She no longer has insurance. Her pharmacy wants to change her inhaler to Respirdlick by Pro-air(not sure of spelling) because it is cheaper. Thank you.

## 2013-11-26 NOTE — Telephone Encounter (Signed)
[name] called. She no longer has insurance. Her pharmacy wants to change her inhaler to Respirdlick by Pro-air(not sure of spelling) because it is cheaper.  Thank you.

## 2013-11-26 NOTE — Telephone Encounter (Signed)
Done

## 2013-11-26 NOTE — Telephone Encounter (Signed)
Dr T sorry for sending the same note multuple times.

## 2013-12-02 ENCOUNTER — Encounter: Payer: Self-pay | Admitting: Sports Medicine

## 2013-12-02 ENCOUNTER — Ambulatory Visit (INDEPENDENT_AMBULATORY_CARE_PROVIDER_SITE_OTHER): Payer: Managed Care, Other (non HMO) | Admitting: Sports Medicine

## 2013-12-02 VITALS — BP 131/91 | HR 84 | Ht 64.5 in | Wt 193.0 lb

## 2013-12-02 DIAGNOSIS — D373 Neoplasm of uncertain behavior of appendix: Secondary | ICD-10-CM

## 2013-12-02 DIAGNOSIS — D509 Iron deficiency anemia, unspecified: Secondary | ICD-10-CM

## 2013-12-02 DIAGNOSIS — D378 Neoplasm of uncertain behavior of other specified digestive organs: Secondary | ICD-10-CM

## 2013-12-02 DIAGNOSIS — D375 Neoplasm of uncertain behavior of rectum: Secondary | ICD-10-CM

## 2013-12-02 DIAGNOSIS — E876 Hypokalemia: Secondary | ICD-10-CM

## 2013-12-02 DIAGNOSIS — D371 Neoplasm of uncertain behavior of stomach: Secondary | ICD-10-CM

## 2013-12-02 MED ORDER — DICYCLOMINE HCL 20 MG PO TABS
20.0000 mg | ORAL_TABLET | Freq: Three times a day (TID) | ORAL | Status: DC
Start: 2013-12-02 — End: 2014-04-18

## 2013-12-02 MED ORDER — DICYCLOMINE HCL 20 MG PO TABS: 20.0000 mg | ORAL_TABLET | Freq: Three times a day (TID) | ORAL | Status: DC

## 2013-12-02 NOTE — Progress Notes (Signed)
  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 I treated her with diphenoxylate and loperamide which worked well initially, unfortunately she now has persistent tenesmus and diarrhea as soon as she eats. We have discussed the possibility that this is irritable bowel syndrome in the past. Symptoms are moderate, persistent, she also endorses increasing fatigue, she has been significantly anemic in the past, particularly right after her surgery, she did not respond well to oral iron supplementation and into needing IV iron at our short stay center. This resolved her symptoms.  Past medical history, Surgical history, Family history not pertinant except as noted below, Social history, Allergies, and medications have been entered into the medical record, reviewed, and no changes needed.   Review of Systems: No fevers, chills, night sweats, weight loss, chest pain, or shortness of breath.   Objective:    General: Well Developed, well nourished, and in no acute distress.  Neuro: Alert and oriented x3, extra-ocular muscles intact, sensation grossly intact.  HEENT: Normocephalic, atraumatic, pupils equal round reactive to light, neck supple, no masses, no lymphadenopathy, thyroid nonpalpable.  Skin: Warm and dry, no rashes. Cardiac: Regular rate and rhythm, no murmurs rubs or gallops, no lower extremity edema.  Respiratory: Clear to auscultation bilaterally. Not using accessory muscles, speaking in full sentences. Abdomen: Soft, minimally tender to palpation in the left and right lower quadrants, nondistended, normal bowel sounds, no guarding or rigidity.  Impression and Recommendations:

## 2013-12-02 NOTE — Assessment & Plan Note (Signed)
She is post hemicolectomy, we had an initial good response to Lomotil, she does have persistent diarrhea and some symptoms suggestive of IBS, diarrhea predominant. I'm going to add Bentyl, and I would like to refer her to GI for further evaluation and assistance.

## 2013-12-02 NOTE — Assessment & Plan Note (Signed)
Rechecking a CBC. TSH. If still anemic we will set her up with an iron infusion.

## 2013-12-03 DIAGNOSIS — E876 Hypokalemia: Secondary | ICD-10-CM | POA: Insufficient documentation

## 2013-12-03 LAB — CBC
HCT: 41.4 % (ref 36.0–46.0)
Hemoglobin: 14.7 g/dL (ref 12.0–15.0)
MCH: 30.9 pg (ref 26.0–34.0)
MCHC: 35.5 g/dL (ref 30.0–36.0)
MCV: 87.2 fL (ref 78.0–100.0)
Platelets: 278 10*3/uL (ref 150–400)
RBC: 4.75 MIL/uL (ref 3.87–5.11)
RDW: 13.8 % (ref 11.5–15.5)
WBC: 9.1 10*3/uL (ref 4.0–10.5)

## 2013-12-03 LAB — COMPREHENSIVE METABOLIC PANEL WITH GFR
Albumin: 4.7 g/dL (ref 3.5–5.2)
CO2: 25 meq/L (ref 19–32)
Potassium: 4 meq/L (ref 3.5–5.3)
Sodium: 134 meq/L — ABNORMAL LOW (ref 135–145)
Total Protein: 7.9 g/dL (ref 6.0–8.3)

## 2013-12-03 LAB — COMPREHENSIVE METABOLIC PANEL
ALT: 18 U/L (ref 0–35)
AST: 15 U/L (ref 0–37)
Alkaline Phosphatase: 73 U/L (ref 39–117)
BUN: 9 mg/dL (ref 6–23)
Calcium: 9.1 mg/dL (ref 8.4–10.5)
Chloride: 100 mEq/L (ref 96–112)
Creat: 0.61 mg/dL (ref 0.50–1.10)
Glucose, Bld: 101 mg/dL — ABNORMAL HIGH (ref 70–99)
Total Bilirubin: 1.2 mg/dL (ref 0.2–1.2)

## 2013-12-03 LAB — TSH: TSH: 6.493 u[IU]/mL — ABNORMAL HIGH (ref 0.350–4.500)

## 2013-12-03 MED ORDER — POTASSIUM CHLORIDE CRYS ER 20 MEQ PO TBCR: 40.0000 meq | EXTENDED_RELEASE_TABLET | Freq: Every day | ORAL | Status: DC

## 2013-12-03 NOTE — Addendum Note (Signed)
Addended by: Silverio Decamp on: 12/03/2013 01:00 PM   Modules accepted: Orders

## 2013-12-03 NOTE — Assessment & Plan Note (Signed)
KCl 40 mEq daily, recheck in one week.

## 2013-12-10 ENCOUNTER — Encounter: Payer: Self-pay | Admitting: Gastroenterology

## 2013-12-30 ENCOUNTER — Ambulatory Visit: Payer: Managed Care, Other (non HMO) | Admitting: Sports Medicine

## 2013-12-30 ENCOUNTER — Encounter: Payer: Self-pay | Admitting: Sports Medicine

## 2013-12-30 DIAGNOSIS — Z0289 Encounter for other administrative examinations: Secondary | ICD-10-CM

## 2014-01-10 ENCOUNTER — Encounter: Payer: Self-pay | Admitting: Sports Medicine

## 2014-01-26 ENCOUNTER — Other Ambulatory Visit: Payer: Self-pay | Admitting: *Deleted

## 2014-01-26 ENCOUNTER — Other Ambulatory Visit: Payer: Self-pay | Admitting: Sports Medicine

## 2014-02-14 ENCOUNTER — Encounter: Payer: Self-pay | Admitting: Sports Medicine

## 2014-04-18 ENCOUNTER — Ambulatory Visit (INDEPENDENT_AMBULATORY_CARE_PROVIDER_SITE_OTHER): Payer: Self-pay | Admitting: Family Medicine

## 2014-04-18 ENCOUNTER — Encounter: Payer: Self-pay | Admitting: Family Medicine

## 2014-04-18 VITALS — BP 128/86 | HR 85 | Temp 98.0°F | Wt 201.0 lb

## 2014-04-18 DIAGNOSIS — B9689 Other specified bacterial agents as the cause of diseases classified elsewhere: Secondary | ICD-10-CM

## 2014-04-18 DIAGNOSIS — J329 Chronic sinusitis, unspecified: Secondary | ICD-10-CM

## 2014-04-18 DIAGNOSIS — A499 Bacterial infection, unspecified: Secondary | ICD-10-CM

## 2014-04-18 MED ORDER — BENZONATATE 100 MG PO CAPS: 100.0000 mg | ORAL_CAPSULE | Freq: Three times a day (TID) | ORAL | Status: DC | PRN

## 2014-04-18 MED ORDER — DOXYCYCLINE HYCLATE 100 MG PO TABS: ORAL_TABLET | ORAL | Status: AC

## 2014-04-18 MED ORDER — METHYLPREDNISOLONE (PAK) 4 MG PO TABS: ORAL_TABLET | ORAL | Status: DC

## 2014-04-18 NOTE — Progress Notes (Signed)
CC: [name] is a 44 y.o. female is here for cough and congestion and Sinusitis?   Subjective: HPI:   complains of cough, sore throat, postnasal drip and facial pressure localized in the cheeks that has been present for a little over a week now. Symptoms are present all hours of today overall mild to moderate in severity. No benefit from over-the-counter often cold medication. Cough is better enough towards keeping her awake at night.  She's also been having some wheezing in the evenings that is only slightly improved with albuterol use.  Denies fevers, chills, nausea, vomiting, chest pain, blood and sputum, nor shortness of breath.   Review Of Systems Outlined In HPI  Past Medical History  Diagnosis Date  . Asthma   . Depression   . Anxiety   . GERD (gastroesophageal reflux disease)   . Hypothyroidism   . Endometriosis   . History of sinus surgery 2010    MAXILLARY, ETHMOID, SPHENOID  . Obesity, Class III, BMI 40-49.9 (morbid obesity)   . SVD (spontaneous vaginal delivery)     x3  . Generalized headaches   . Adenomyosis 2013  . Kidney stones   . Complication of anesthesia     scoline pain? from use of Succinylcholine   . Anemia   . Iron deficiency anemia   . Cough   . Cancer of appendix   . Migraine     Past Surgical History  Procedure Laterality Date  . Knee surgery      right, scope and ACL repair  . Nasal sinus surgery  2010  . Colposcopy  2007  . Laparoscopy  05/30/2011    Procedure: LAPAROSCOPY OPERATIVE;  Surgeon: Darlyn Chamber, MD;  Location: [facility];  Service: Gynecology;  Laterality: N/A;  YAG  LASER of Endometriosis.  . Laparoscopic assisted vaginal hysterectomy  11/13/2011    Procedure: LAPAROSCOPIC ASSISTED VAGINAL HYSTERECTOMY;  Surgeon: Darlyn Chamber, MD;  Location: [facility];  Service: Gynecology;  Laterality: N/A;  . Abdominal hysterectomy    . Laparoscopic appendectomy  01/02/2012    Procedure: APPENDECTOMY LAPAROSCOPIC;  Surgeon: Harl Bowie, MD;  Location: [facility];  Service: General;  Laterality: N/A;  . Partial colectomy  01/27/2012  . Cholecystectomy  01/27/2012    Procedure: LAPAROSCOPIC CHOLECYSTECTOMY;  Surgeon: Harl Bowie, MD;  Location: [facility];  Service: General;  Laterality: N/A;  Laparoscopic chole   Family History  Problem Relation Age of Onset  . Lung cancer Father   . Alcohol abuse Father   . Colon cancer Paternal Grandmother   . Depression Mother   . Depression Sister   . Suicidality Brother   . Depression Brother   . Alcohol abuse Brother   . Drug abuse Brother   . Depression Sister   . Anesthesia problems Neg Hx   . Asthma Mother   . Asthma Sister     History   Social History  . Marital Status: Married    Spouse Name: N/A    Number of Children: 3  . Years of Education: N/A   Occupational History  . RN [employer]   Social History Main Topics  . Smoking status: Never Smoker   . Smokeless tobacco: Never Used  . Alcohol Use: No  . Drug Use: No  . Sexual Activity:    Partners: Male    Birth Control/ Protection: None   Other Topics Concern  . Not on file   Social History Narrative  Objective: BP 128/86 mmHg  Pulse 85  Temp(Src) 98 F (36.7 C) (Oral)  Wt 201 lb (91.173 kg)  SpO2 100%  LMP 11/02/2011  General: Alert and Oriented, No Acute Distress HEENT: Pupils equal, round, reactive to light. Conjunctivae clear.  External ears unremarkable, canals clear with intact TMs with appropriate landmarks.  Middle ear appears open without effusion. Pink inferior turbinates.  Moist mucous membranes, pharynx without inflammation nor lesions however moderate cobblestoning and postnasal drip.  Neck supple without palpable lymphadenopathy nor abnormal masses. Lungs:  Trace end expiratory wheezing in the left and right upper lung fields without  Wheezing elsewhere. No rhonchi or rales. Extremities: No peripheral edema.  Strong peripheral pulses.  Mental Status: No depression,  anxiety, nor agitation. Skin: Warm and dry.  Assessment & Plan: Geraldyn was seen today for cough and congestion and sinusitis?.  Diagnoses and associated orders for this visit:  Bacterial sinusitis - doxycycline (VIBRA-TABS) 100 MG tablet; One by mouth twice a day for ten days. - methylPREDNIsolone (MEDROL DOSPACK) 4 MG tablet; follow package directions  Other Orders - benzonatate (TESSALON PERLES) 100 MG capsule; Take 1 capsule (100 mg total) by mouth 3 (three) times daily as needed for cough.     bacterial sinusitis: Begin doxycycline. Given her  Increased need of  Albuterol and encourage her to start a Medrol Dosepak area she politely declines  Due to undesirable side effects however  She is agreeable to starting this if no better by Wednesday.  Tessalon Perles per her request for cough.   Return if symptoms worsen or fail to improve.

## 2014-04-22 ENCOUNTER — Encounter: Payer: Self-pay | Admitting: Sports Medicine

## 2014-04-22 ENCOUNTER — Ambulatory Visit (INDEPENDENT_AMBULATORY_CARE_PROVIDER_SITE_OTHER): Payer: 59

## 2014-04-22 ENCOUNTER — Ambulatory Visit (INDEPENDENT_AMBULATORY_CARE_PROVIDER_SITE_OTHER): Payer: 59 | Admitting: Sports Medicine

## 2014-04-22 VITALS — BP 142/99 | HR 93 | Ht 64.0 in | Wt 196.0 lb

## 2014-04-22 DIAGNOSIS — M25562 Pain in left knee: Secondary | ICD-10-CM

## 2014-04-22 DIAGNOSIS — D373 Neoplasm of uncertain behavior of appendix: Secondary | ICD-10-CM

## 2014-04-22 DIAGNOSIS — M25561 Pain in right knee: Secondary | ICD-10-CM

## 2014-04-22 MED ORDER — HYDROCODONE-HOMATROPINE 5-1.5 MG/5ML PO SYRP: 5.0000 mL | ORAL_SOLUTION | Freq: Three times a day (TID) | ORAL | Status: DC | PRN

## 2014-04-22 NOTE — Assessment & Plan Note (Signed)
Lateral joint line, she also has some joint line pain suggestive of a meniscal injury. 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  Xrays. Rehabilitation exercises, back to see me in one month, injection if no better.

## 2014-04-22 NOTE — Assessment & Plan Note (Signed)
Persistent diarrhea, she is post-hemicolectomy. Referral to gastroenterology. Some of her symptoms do resemble IBS.

## 2014-04-22 NOTE — Progress Notes (Signed)
  Subjective:    CC: Left leg pain  HPI: For the past several weeks this pleasant 44 year old female has had pain that she localizes over the posterior lateral aspect of her left knee, worse with flexing the knee, no mechanical symptoms, no swelling, no trauma. Symptoms are moderate, persistent.  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 Has had persistent diarrhea since. Desires referral to gastroenterology.  Preventive measures: Got a new job, desires flu shot.  Past medical history, Surgical history, Family history not pertinant except as noted below, Social history, Allergies, and medications have been entered into the medical record, reviewed, and no changes needed.   Review of Systems: No fevers, chills, night sweats, weight loss, chest pain, or shortness of breath.   Objective:    General: Well Developed, well nourished, and in no acute distress.  Neuro: Alert and oriented x3, extra-ocular muscles intact, sensation grossly intact.  HEENT: Normocephalic, atraumatic, pupils equal round reactive to light, neck supple, no masses, no lymphadenopathy, thyroid nonpalpable.  Skin: Warm and dry, no rashes. Cardiac: Regular rate and rhythm, no murmurs rubs or gallops, no lower extremity edema.  Respiratory: Clear to auscultation bilaterally. Not using accessory muscles, speaking in full sentences. Left Knee: Tender to palpation at the lateral joint line posteriorly, reproduction of pain with terminal flexion. ROM normal in flexion and extension and lower leg rotation. Ligaments with solid consistent endpoints including ACL, PCL, LCL, MCL. Negative Mcmurray's and provocative meniscal tests. Non painful patellar compression. Patellar and quadriceps tendons unremarkable. Hamstring and quadriceps strength is normal.  Impression and Recommendations:

## 2014-05-05 ENCOUNTER — Ambulatory Visit (INDEPENDENT_AMBULATORY_CARE_PROVIDER_SITE_OTHER): Payer: 59

## 2014-05-05 ENCOUNTER — Ambulatory Visit (INDEPENDENT_AMBULATORY_CARE_PROVIDER_SITE_OTHER): Payer: 59 | Admitting: Sports Medicine

## 2014-05-05 ENCOUNTER — Encounter: Payer: Self-pay | Admitting: Sports Medicine

## 2014-05-05 VITALS — BP 131/91 | HR 74 | Ht 64.0 in | Wt 201.0 lb

## 2014-05-05 DIAGNOSIS — M533 Sacrococcygeal disorders, not elsewhere classified: Secondary | ICD-10-CM

## 2014-05-05 DIAGNOSIS — R102 Pelvic and perineal pain: Secondary | ICD-10-CM

## 2014-05-05 DIAGNOSIS — M25552 Pain in left hip: Secondary | ICD-10-CM

## 2014-05-05 DIAGNOSIS — W000XXA Fall on same level due to ice and snow, initial encounter: Secondary | ICD-10-CM

## 2014-05-05 MED ORDER — CYCLOBENZAPRINE HCL 10 MG PO TABS: ORAL_TABLET | ORAL | Status: DC

## 2014-05-05 NOTE — Progress Notes (Signed)
  Subjective:    CC: Fall  HPI: [name] fell yesterday onto her left buttock and left shoulder, she doesn't have any shoulder pain but her left buttock and hip are quite painful. She is ambulatory without much of a problem and desires some Flexeril. Pain is mild, persistent.  Past medical history, Surgical history, Family history not pertinant except as noted below, Social history, Allergies, and medications have been entered into the medical record, reviewed, and no changes needed.   Review of Systems: No fevers, chills, night sweats, weight loss, chest pain, or shortness of breath.   Objective:    General: Well Developed, well nourished, and in no acute distress.  Neuro: Alert and oriented x3, extra-ocular muscles intact, sensation grossly intact.  HEENT: Normocephalic, atraumatic, pupils equal round reactive to light, neck supple, no masses, no lymphadenopathy, thyroid nonpalpable.  Skin: Warm and dry, no rashes. Cardiac: Regular rate and rhythm, no murmurs rubs or gallops, no lower extremity edema.  Respiratory: Clear to auscultation bilaterally. Not using accessory muscles, speaking in full sentences. Left Hip: ROM IR: 60 Deg, ER: 60 Deg, Flexion: 120 Deg, Extension: 100 Deg, Abduction: 45 Deg, Adduction: 45 Deg Strength IR: 5/5, ER: 5/5, Flexion: 5/5, Extension: 5/5, Abduction: 5/5, Adduction: 5/5 Pelvic alignment unremarkable to inspection and palpation. Standing hip rotation and gait without trendelenburg / unsteadiness. Greater trochanter without tenderness to palpation. No tenderness over piriformis. No SI joint tenderness and normal minimal SI movement. There is mild tenderness over the left gluteus medius.  Impression and Recommendations:

## 2014-05-05 NOTE — Assessment & Plan Note (Signed)
After a recent fall with pain referable to both the femoral acetabular joint,  In the gluteus medius tendons. X-rays of the sacrum, hips/pelvis. Return as needed.

## 2014-05-18 ENCOUNTER — Encounter: Payer: Self-pay | Admitting: Physician Assistant

## 2014-05-18 ENCOUNTER — Ambulatory Visit (INDEPENDENT_AMBULATORY_CARE_PROVIDER_SITE_OTHER): Payer: 59 | Admitting: Physician Assistant

## 2014-05-18 VITALS — BP 137/90 | HR 100 | Temp 98.9°F | Ht 64.0 in | Wt 193.0 lb

## 2014-05-18 DIAGNOSIS — J111 Influenza due to unidentified influenza virus with other respiratory manifestations: Secondary | ICD-10-CM

## 2014-05-18 DIAGNOSIS — R69 Illness, unspecified: Principal | ICD-10-CM

## 2014-05-18 NOTE — Progress Notes (Signed)
   Subjective:    Patient ID: [name], female    DOB: 14-Apr-1971, 44 y.o.   MRN: [id number]  HPI Patient is a 44 year old female who presents to the clinic with 5 days of fever, chills, body aches and sweats. She also has some bilateral ear pressure. She denies any sore throat, sinus pressure, shortness of breath or wheezing. She does have a dry cough on and off. She has been taking Advil and DayQuil which helps a little. Her last temperature was at 1:30 today. It was 101. She has since taken Advil and her fever has resolved.   Review of Systems  All other systems reviewed and are negative.      Objective:   Physical Exam  Constitutional: She is oriented to person, place, and time. She appears well-developed and well-nourished.  HENT:  Head: Normocephalic and atraumatic.  Right Ear: External ear normal.  Left Ear: External ear normal.  Nose: Nose normal.  Mouth/Throat: Oropharynx is clear and moist. No oropharyngeal exudate.  Eyes: Conjunctivae are normal. Right eye exhibits no discharge. Left eye exhibits no discharge.  Neck: Normal range of motion. Neck supple.  Cardiovascular: Normal rate, regular rhythm and normal heart sounds.   Pulmonary/Chest: Effort normal and breath sounds normal. She has no wheezes.  Lymphadenopathy:    She has no cervical adenopathy.  Neurological: She is alert and oriented to person, place, and time.  Psychiatric: She has a normal mood and affect. Her behavior is normal.          Assessment & Plan:  Influenza-like illness-patient's pulse ox is good today. Her temperature is controlled. I discussed with patient I see no signs of active infection. She is out of the treatment window for flu. Certainly her symptoms sound consistent with flulike illness. I do not think testing would be worth it at this time. I did write her out of work until she is fever free for 24 hours. I suggested rest and hydration. She could consider Flonase for her ear  pressure and discomfort. Follow-up if

## 2014-05-18 NOTE — Patient Instructions (Signed)
Stay hydrated.  Motrin/tylenol

## 2014-05-20 ENCOUNTER — Encounter: Payer: Self-pay | Admitting: Sports Medicine

## 2014-05-20 ENCOUNTER — Ambulatory Visit (INDEPENDENT_AMBULATORY_CARE_PROVIDER_SITE_OTHER): Payer: 59 | Admitting: Sports Medicine

## 2014-05-20 VITALS — BP 123/89 | HR 77 | Wt 196.0 lb

## 2014-05-20 DIAGNOSIS — M791 Myalgia: Secondary | ICD-10-CM

## 2014-05-20 DIAGNOSIS — M7918 Myalgia, other site: Secondary | ICD-10-CM

## 2014-05-20 DIAGNOSIS — M25552 Pain in left hip: Secondary | ICD-10-CM

## 2014-05-20 DIAGNOSIS — J320 Chronic maxillary sinusitis: Secondary | ICD-10-CM

## 2014-05-20 MED ORDER — LEVOMILNACIPRAN HCL ER 20 & 40 MG PO C4PK: EXTENDED_RELEASE_CAPSULE | ORAL | Status: DC

## 2014-05-20 NOTE — Assessment & Plan Note (Signed)
Inadequate response to Cymbalta, adverse effects with Effexor. We are going to try Fetzima. Return in a month to see how things are going, advised to push through any side effects.

## 2014-05-20 NOTE — Assessment & Plan Note (Signed)
Resolved

## 2014-05-20 NOTE — Assessment & Plan Note (Signed)
Advised to restart daily antihistamines, and intranasal steroids.

## 2014-05-20 NOTE — Progress Notes (Signed)
  Subjective:    CC: Follow-up  HPI: Recent fall: X-rays of the sacrum and pelvis were negative, and has resolved.  Widespread pain: She does endorse migratory pain in her knees, hips, elbows, shoulders, neck, and back bilaterally. She also has a history of depression, and wonders what could cause this migratory pain. She is fairly accepting the diagnosis of myofascial pain associated with depression, and denies a suicidal or homicidal ideation.  Chronic sinusitis: Has recurrent episodes, recently saw Iran Planas, PA-C for this. She is not using her nasal spray or an oral antihistamine.  Past medical history, Surgical history, Family history not pertinant except as noted below, Social history, Allergies, and medications have been entered into the medical record, reviewed, and no changes needed.   Review of Systems: No fevers, chills, night sweats, weight loss, chest pain, or shortness of breath.   Objective:    General: Well Developed, well nourished, and in no acute distress.  Neuro: Alert and oriented x3, extra-ocular muscles intact, sensation grossly intact.  HEENT: Normocephalic, atraumatic, pupils equal round reactive to light, neck supple, no masses, no lymphadenopathy, thyroid nonpalpable.  Skin: Warm and dry, no rashes. Cardiac: Regular rate and rhythm, no murmurs rubs or gallops, no lower extremity edema.  Respiratory: Clear to auscultation bilaterally. Not using accessory muscles, speaking in full sentences.  Impression and Recommendations:

## 2014-06-01 ENCOUNTER — Other Ambulatory Visit: Payer: Self-pay | Admitting: *Deleted

## 2014-06-01 ENCOUNTER — Encounter: Payer: Self-pay | Admitting: Physician Assistant

## 2014-06-01 ENCOUNTER — Ambulatory Visit (INDEPENDENT_AMBULATORY_CARE_PROVIDER_SITE_OTHER): Payer: 59 | Admitting: Physician Assistant

## 2014-06-01 ENCOUNTER — Telehealth: Payer: Self-pay | Admitting: *Deleted

## 2014-06-01 VITALS — BP 152/92 | HR 108 | Ht 64.0 in | Wt 198.0 lb

## 2014-06-01 DIAGNOSIS — F419 Anxiety disorder, unspecified: Secondary | ICD-10-CM

## 2014-06-01 DIAGNOSIS — F410 Panic disorder [episodic paroxysmal anxiety] without agoraphobia: Secondary | ICD-10-CM

## 2014-06-01 DIAGNOSIS — F4310 Post-traumatic stress disorder, unspecified: Secondary | ICD-10-CM

## 2014-06-01 MED ORDER — ALPRAZOLAM 0.5 MG PO TABS: 0.5000 mg | ORAL_TABLET | Freq: Two times a day (BID) | ORAL | Status: DC | PRN

## 2014-06-01 MED ORDER — VENLAFAXINE HCL ER 37.5 MG PO CP24: 37.5000 mg | ORAL_CAPSULE | Freq: Every day | ORAL | Status: DC

## 2014-06-01 MED ORDER — DULOXETINE HCL 30 MG PO CPEP: 30.0000 mg | ORAL_CAPSULE | Freq: Every day | ORAL | Status: DC

## 2014-06-01 MED ORDER — VENLAFAXINE HCL ER 75 MG PO CP24: 75.0000 mg | ORAL_CAPSULE | Freq: Every day | ORAL | Status: DC

## 2014-06-01 NOTE — Telephone Encounter (Signed)
Pt lv stating that the cymbalta will cost her $80.  She said that the pharmacist suggested effexor for her.

## 2014-06-01 NOTE — Telephone Encounter (Signed)
[place] for effexor 37.5mg  once daily for 7 days then increase to 75mg  XR once daily. With 1 refill.
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
## 2014-06-01 NOTE — Telephone Encounter (Signed)
Tried calling pt to notify her of new rx. Her vm is full & I was unable to leave vm.

## 2014-06-02 NOTE — Telephone Encounter (Signed)
Called pt back this morning to notify pt of new rx sent.

## 2014-06-10 ENCOUNTER — Encounter: Payer: Self-pay | Admitting: Physician Assistant

## 2014-06-10 ENCOUNTER — Ambulatory Visit (INDEPENDENT_AMBULATORY_CARE_PROVIDER_SITE_OTHER): Payer: 59 | Admitting: Physician Assistant

## 2014-06-10 VITALS — BP 143/90 | HR 82 | Wt 198.0 lb

## 2014-06-10 DIAGNOSIS — F4310 Post-traumatic stress disorder, unspecified: Secondary | ICD-10-CM | POA: Diagnosis not present

## 2014-06-10 DIAGNOSIS — F410 Panic disorder [episodic paroxysmal anxiety] without agoraphobia: Secondary | ICD-10-CM | POA: Diagnosis not present

## 2014-06-10 DIAGNOSIS — F419 Anxiety disorder, unspecified: Secondary | ICD-10-CM | POA: Diagnosis not present

## 2014-06-10 MED ORDER — BUSPIRONE HCL 5 MG PO TABS
5.0000 mg | ORAL_TABLET | Freq: Three times a day (TID) | ORAL | Status: DC
Start: 2014-06-10 — End: 2014-06-17

## 2014-06-10 NOTE — Progress Notes (Signed)
   Subjective:    Patient ID: NEIL ERRICKSON, female    DOB: 16-Apr-1970, 44 y.o.   MRN: [id number]  HPI  Patient is a 44 year old female who presents to the clinic for anxiety and panic attacks. She was seen in the clinic 1 week ago. Her symptoms are unchanged. 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 She has been diagnosed with PTSD. On her way to work she sometimes will have memories that trigger panic attacks. She has had to stop on the side of the road a few times. Now she is scared that will happen every time she goes to work. She has not been seen by counselor at this time. 6 Thursday she does have her first counseling appointment. She is using the Xanax as needed. She is only using approximately 1/4-1/2 a tab at a time. She's only used 12 since lasted she is concerned about getting addicted to benzodiazepines; however, she feels like she needs them. She does admit when she does not have to go to work that she does not have to use xanax and feels much better. Denies any sucidal or homicidal thoughts.    Review of Systems  All other systems reviewed and are negative.      Objective:   Physical Exam  Constitutional: She is oriented to person, place, and time. She appears well-developed and well-nourished.  HENT:  Head: Normocephalic and atraumatic.  Cardiovascular: Normal rate, regular rhythm and normal heart sounds.   Neurological: She is alert and oriented to person, place, and time.  Skin: Skin is dry.  Psychiatric:  Leg bouncy the whole time. She sits on the edge of her seat.           Assessment & Plan:  PTSD/acute anxiety/panic attack- GAD-21. Unchanged since last visit. No suicidal or homicidal thoughts. Work seems to be trigger. She reallly needs to work for the money. She questions disability. Will send to pysch for further work up. Did add buspar 5mg  TID for anxiety. Pt concerned she will get addicted to xanax. Only been on effexor for 1 week. Give a little more time on  75mg  dose. Follow up with psych. Keep counseling appt.

## 2014-06-16 ENCOUNTER — Encounter: Payer: Self-pay | Admitting: Gastroenterology

## 2014-06-17 ENCOUNTER — Ambulatory Visit (INDEPENDENT_AMBULATORY_CARE_PROVIDER_SITE_OTHER): Payer: 59 | Admitting: Sports Medicine

## 2014-06-17 ENCOUNTER — Encounter: Payer: Self-pay | Admitting: Sports Medicine

## 2014-06-17 VITALS — BP 146/87 | HR 76 | Ht 64.0 in | Wt 202.0 lb

## 2014-06-17 DIAGNOSIS — F4310 Post-traumatic stress disorder, unspecified: Secondary | ICD-10-CM

## 2014-06-17 NOTE — Assessment & Plan Note (Signed)
Continue Effexor at 75 mg, she has only been taking it for 2 weeks now. Increasing buspirone to 10 mg 3 times a day, limit use of alprazolam. Continue care with cognitive behavioral therapist, and return to see me in 4 weeks, at that point if insufficient control we will increase Effexor to 150 mg and increase buspirone to 15 mg 3 times a day.

## 2014-06-17 NOTE — Progress Notes (Signed)
  Subjective:    CC: follow-up   HPI: Anxiety, depression, PTSD: This is a 44 year old female, she has myofascial pain, as well as anxiety and depression associated with PTSD. We started her on Fetzima, she was unable to afford this, and was switched to venlafaxine , with buspirone for anxiety, and alprazolam for breakthrough panic. She is only been on the current medication of 2 weeks, and notes no improvement yet. She continues to have panic, and is tearful in the exam room. She understands that the medication will take longer to work. O suicidal or homicidal ideation.  Past medical history, Surgical history, Family history not pertinant except as noted below, Social history, Allergies, and medications have been entered into the medical record, reviewed, and no changes needed.   Review of Systems: No fevers, chills, night sweats, weight loss, chest pain, or shortness of breath.   Objective:    General: Well Developed, well nourished, and in no acute distress.  Tearful. Neuro: Alert and oriented x3, extra-ocular muscles intact, sensation grossly intact.  HEENT: Normocephalic, atraumatic, pupils equal round reactive to light, neck supple, no masses, no lymphadenopathy, thyroid nonpalpable.  Skin: Warm and dry, no rashes. Cardiac: Regular rate and rhythm, no murmurs rubs or gallops, no lower extremity edema.  Respiratory: Clear to auscultation bilaterally. Not using accessory muscles, speaking in full sentences.  Impression and Recommendations:

## 2014-06-29 ENCOUNTER — Other Ambulatory Visit: Payer: Self-pay | Admitting: Physician Assistant

## 2014-06-30 ENCOUNTER — Other Ambulatory Visit: Payer: Self-pay | Admitting: Sports Medicine

## 2014-06-30 ENCOUNTER — Telehealth: Payer: Self-pay

## 2014-06-30 MED ORDER — BUSPIRONE HCL 10 MG PO TABS: 10.0000 mg | ORAL_TABLET | Freq: Three times a day (TID) | ORAL | Status: DC

## 2014-06-30 MED ORDER — BUSPIRONE HCL 5 MG PO TABS: 10.0000 mg | ORAL_TABLET | Freq: Three times a day (TID) | ORAL | Status: DC

## 2014-06-30 NOTE — Telephone Encounter (Signed)
Done

## 2014-06-30 NOTE — Telephone Encounter (Signed)
Got a call from [place] about a error of medication that was sent in to pharmacy. Buspar 5 mg was only written for 1 tablet. Please clarify and resend medication to [facility]. Treyveon Mochizuki,CMA

## 2014-06-30 NOTE — Telephone Encounter (Signed)
Buspar Rx was increased at last appointment, faxed received from [place] requesting updated Rx dosage and directions. New Rx sent over.

## 2014-07-12 ENCOUNTER — Other Ambulatory Visit: Payer: Self-pay | Admitting: *Deleted

## 2014-07-12 DIAGNOSIS — J4521 Mild intermittent asthma with (acute) exacerbation: Secondary | ICD-10-CM

## 2014-07-12 MED ORDER — ALBUTEROL SULFATE HFA 108 (90 BASE) MCG/ACT IN AERS: 2.0000 | INHALATION_SPRAY | RESPIRATORY_TRACT | Status: DC | PRN

## 2014-07-15 ENCOUNTER — Encounter: Payer: Self-pay | Admitting: Sports Medicine

## 2014-07-15 ENCOUNTER — Ambulatory Visit (INDEPENDENT_AMBULATORY_CARE_PROVIDER_SITE_OTHER): Payer: 59 | Admitting: Sports Medicine

## 2014-07-15 ENCOUNTER — Ambulatory Visit: Payer: 59 | Admitting: Sports Medicine

## 2014-07-15 VITALS — BP 128/92 | HR 82 | Ht 64.0 in | Wt 208.0 lb

## 2014-07-15 DIAGNOSIS — F4310 Post-traumatic stress disorder, unspecified: Secondary | ICD-10-CM | POA: Diagnosis not present

## 2014-07-15 DIAGNOSIS — J324 Chronic pansinusitis: Secondary | ICD-10-CM

## 2014-07-15 DIAGNOSIS — J329 Chronic sinusitis, unspecified: Secondary | ICD-10-CM | POA: Diagnosis not present

## 2014-07-15 DIAGNOSIS — J320 Chronic maxillary sinusitis: Secondary | ICD-10-CM | POA: Diagnosis not present

## 2014-07-15 MED ORDER — BUSPIRONE HCL 15 MG PO TABS: 15.0000 mg | ORAL_TABLET | Freq: Three times a day (TID) | ORAL | Status: DC

## 2014-07-15 MED ORDER — HYDROCODONE-HOMATROPINE 5-1.5 MG/5ML PO SYRP: 5.0000 mL | ORAL_SOLUTION | Freq: Three times a day (TID) | ORAL | Status: DC | PRN

## 2014-07-15 MED ORDER — AZELASTINE-FLUTICASONE 137-50 MCG/ACT NA SUSP: NASAL | Status: DC

## 2014-07-15 NOTE — Assessment & Plan Note (Signed)
Improved significantly with Effexor and BuSpar. No change in Effexor dose, leaving at 75 mg, we will increase BuSpar to 15 mg 3 times a day. Return in one month.

## 2014-07-15 NOTE — Progress Notes (Signed)
  Subjective:    CC: Follow-up  HPI: Anxiety/depression/PTSD: Overall doing significantly better on Effexor 75 and 10 mg of BuSpar 3 times a day, only mild anhedonia, depressed mood, difficulty sleeping, difficulty concentrating with moderate poor energy and guilt, no suicidal or homicidal ideation, also only mild anxiety, worry, restlessness and irritability, and moderate difficulty relaxing and sense of impending doom. She does desire to only go up on her BuSpar as she feels as though her depression is well controlled.  Upper respirator infection: Predominantly cough, needs Hycodan.  Past medical history, Surgical history, Family history not pertinant except as noted below, Social history, Allergies, and medications have been entered into the medical record, reviewed, and no changes needed.   Review of Systems: No fevers, chills, night sweats, weight loss, chest pain, or shortness of breath.   Objective:    General: Well Developed, well nourished, and in no acute distress.  Neuro: Alert and oriented x3, extra-ocular muscles intact, sensation grossly intact.  HEENT: Normocephalic, atraumatic, pupils equal round reactive to light, neck supple, no masses, no lymphadenopathy, thyroid nonpalpable. Oropharynx, nasopharynx, ear canals are unremarkable. Skin: Warm and dry, no rashes. Cardiac: Regular rate and rhythm, no murmurs rubs or gallops, no lower extremity edema.  Respiratory: Clear to auscultation bilaterally. Not using accessory muscles, speaking in full sentences.  Impression and Recommendations:

## 2014-07-15 NOTE — Assessment & Plan Note (Signed)
With predominantly eustachian tube dysfunction at this point, sx present for 3 months. Adding some Hycodan cough syrup. Switching to Dymista Referral to ENT, she continues to have chronic eustachian tube dysfunction and will likely need tympanostomy tube placement.

## 2014-08-05 ENCOUNTER — Other Ambulatory Visit: Payer: Self-pay | Admitting: Physician Assistant

## 2014-08-12 ENCOUNTER — Ambulatory Visit (INDEPENDENT_AMBULATORY_CARE_PROVIDER_SITE_OTHER): Payer: 59 | Admitting: Sports Medicine

## 2014-08-12 DIAGNOSIS — F4310 Post-traumatic stress disorder, unspecified: Secondary | ICD-10-CM

## 2014-08-12 MED ORDER — ALPRAZOLAM 0.5 MG PO TABS: 0.5000 mg | ORAL_TABLET | Freq: Two times a day (BID) | ORAL | Status: DC | PRN

## 2014-08-12 MED ORDER — VENLAFAXINE HCL ER 75 MG PO CP24: 75.0000 mg | ORAL_CAPSULE | Freq: Every day | ORAL | Status: DC

## 2014-08-12 MED ORDER — BUSPIRONE HCL 15 MG PO TABS: 15.0000 mg | ORAL_TABLET | Freq: Three times a day (TID) | ORAL | Status: DC

## 2014-08-12 NOTE — Assessment & Plan Note (Signed)
Improved significantly on 75 of Effexor and 15 of BuSpar 3 times a day. Xanax given for breakthrough anxiety. Continue their peak and EMDR. Return in 6 months.

## 2014-08-12 NOTE — Progress Notes (Signed)
  Subjective:    CC:  Follow-up  HPI: Anxiety and PTSD: Has improved significantly on her current dose of Effexor as well as BuSpar, Notes only mild difficulty relaxing, irritability and fear of impending doom, mild difficulty sleeping, poor energy, change in appetite and difficulty concentrating, all other symptoms are essentially negative. She has been working with her psychotherapist. Things are going well.   No suicidal or homicidal ideation.  Past medical history, Surgical history, Family history not pertinant except as noted below, Social history, Allergies, and medications have been entered into the medical record, reviewed, and no changes needed.   Review of Systems: No fevers, chills, night sweats, weight loss, chest pain, or shortness of breath.   Objective:    General: Well Developed, well nourished, and in no acute distress.  Neuro: Alert and oriented x3, extra-ocular muscles intact, sensation grossly intact.  HEENT: Normocephalic, atraumatic, pupils equal round reactive to light, neck supple, no masses, no lymphadenopathy, thyroid nonpalpable.  Skin: Warm and dry, no rashes. Cardiac: Regular rate and rhythm, no murmurs rubs or gallops, no lower extremity edema.  Respiratory: Clear to auscultation bilaterally. Not using accessory muscles, speaking in full sentences.  Impression and Recommendations:

## 2014-09-16 ENCOUNTER — Ambulatory Visit (INDEPENDENT_AMBULATORY_CARE_PROVIDER_SITE_OTHER): Payer: 59 | Admitting: Family Medicine

## 2014-09-16 ENCOUNTER — Encounter: Payer: Self-pay | Admitting: Family Medicine

## 2014-09-16 VITALS — BP 152/94 | HR 75 | Temp 98.9°F | Wt 216.0 lb

## 2014-09-16 DIAGNOSIS — R112 Nausea with vomiting, unspecified: Secondary | ICD-10-CM

## 2014-09-16 DIAGNOSIS — R197 Diarrhea, unspecified: Secondary | ICD-10-CM

## 2014-09-16 MED ORDER — PROCHLORPERAZINE MALEATE 5 MG PO TABS: 5.0000 mg | ORAL_TABLET | Freq: Four times a day (QID) | ORAL | Status: DC | PRN

## 2014-09-16 MED ORDER — DIPHENOXYLATE-ATROPINE 2.5-0.025 MG PO TABS: 1.0000 | ORAL_TABLET | Freq: Four times a day (QID) | ORAL | Status: DC | PRN

## 2014-09-16 MED ORDER — PROMETHAZINE HCL 25 MG PO TABS: 25.0000 mg | ORAL_TABLET | Freq: Three times a day (TID) | ORAL | Status: DC | PRN

## 2014-09-16 MED ORDER — PROMETHAZINE HCL 25 MG/ML IJ SOLN
50.0000 mg | Freq: Once | INTRAMUSCULAR | Status: AC
  Administered 2014-09-16: 50 mg via INTRAMUSCULAR

## 2014-09-16 MED ORDER — PROMETHAZINE HCL 25 MG/ML IJ SOLN: 50.0000 mg | Freq: Four times a day (QID) | INTRAMUSCULAR | Status: DC | PRN

## 2014-09-18 NOTE — Progress Notes (Signed)
CC: [name] is a 44 y.o. female is here for f/u ER   Subjective: HPI:  Watery diarrhea and vomiting that has been going on for the last 2 days. She's had symptoms like this off and on for a few years now ever since having a hemicolectomy. Has been accompanied by nausea not responding to Zofran. Mild improvement with Phenergan. Nothing else makes symptoms better or worse. Moderate to severe in severity. She was seen at a local emergency room last night and had a unremarkable CT scan of the abdomen and pelvis with contrast. She also had blood work that was unremarkable other than a mild rise in her eosinophils. She's lost her appetite but retains thirst. She denies fevers, chills, back pain, flank pain, genitourinary complaints, chest pain, shortness of breath or lightheadedness. She's had some abdominal pain is generalized and mild in severity.    Review Of Systems Outlined In HPI  Past Medical History  Diagnosis Date  . Asthma   . Depression   . Anxiety   . GERD (gastroesophageal reflux disease)   . Hypothyroidism   . Endometriosis   . History of sinus surgery 2010    MAXILLARY, ETHMOID, SPHENOID  . Obesity, Class III, BMI 40-49.9 (morbid obesity)   . SVD (spontaneous vaginal delivery)     x3  . Generalized headaches   . Adenomyosis 2013  . Kidney stones   . Complication of anesthesia     scoline pain? from use of Succinylcholine   . Anemia   . Iron deficiency anemia   . Cough   . Cancer of appendix   . Migraine     Past Surgical History  Procedure Laterality Date  . Knee surgery      right, scope and ACL repair  . Nasal sinus surgery  2010  . Colposcopy  2007  . Laparoscopy  05/30/2011    Procedure: LAPAROSCOPY OPERATIVE;  Surgeon: Darlyn Chamber, MD;  Location: [facility];  Service: Gynecology;  Laterality: N/A;  YAG  LASER of Endometriosis.  . Laparoscopic assisted vaginal hysterectomy  11/13/2011    Procedure: LAPAROSCOPIC ASSISTED VAGINAL HYSTERECTOMY;  Surgeon:  Darlyn Chamber, MD;  Location: [facility];  Service: Gynecology;  Laterality: N/A;  . Abdominal hysterectomy    . Laparoscopic appendectomy  01/02/2012    Procedure: APPENDECTOMY LAPAROSCOPIC;  Surgeon: Harl Bowie, MD;  Location: [facility];  Service: General;  Laterality: N/A;  . Partial colectomy  01/27/2012  . Cholecystectomy  01/27/2012    Procedure: LAPAROSCOPIC CHOLECYSTECTOMY;  Surgeon: Harl Bowie, MD;  Location: [facility];  Service: General;  Laterality: N/A;  Laparoscopic chole   Family History  Problem Relation Age of Onset  . Lung cancer Father   . Alcohol abuse Father   . Colon cancer Paternal Grandmother   . Depression Mother   . Depression Sister   . Suicidality Brother   . Depression Brother   . Alcohol abuse Brother   . Drug abuse Brother   . Depression Sister   . Anesthesia problems Neg Hx   . Asthma Mother   . Asthma Sister     History   Social History  . Marital Status: Married    Spouse Name: N/A  . Number of Children: 3  . Years of Education: N/A   Occupational History  . RN [employer]   Social History Main Topics  . Smoking status: Never Smoker   . Smokeless tobacco: Never Used  . Alcohol Use: No  .  Drug Use: No  . Sexual Activity:    Partners: Male    Birth Control/ Protection: None   Other Topics Concern  . Not on file   Social History Narrative     Objective: BP 152/94 mmHg  Pulse 75  Temp(Src) 98.9 F (37.2 C) (Oral)  Wt 216 lb (97.977 kg)  LMP 11/02/2011  General: Alert and Oriented, No Acute Distress HEENT: Pupils equal, round, reactive to light. Conjunctivae clear. Moist mucous membranes Lungs: Clear to auscultation bilaterally, no wheezing/ronchi/rales.  Comfortable work of breathing. Good air movement. Cardiac: Regular rate and rhythm. Normal S1/S2.  No murmurs, rubs, nor gallops.   Abdomen: soft and non tender without palpable masses. No rebound tenderness nor guarding. Extremities: No peripheral edema.  Strong  peripheral pulses.  Mental Status: No anxiety, nor agitation. Tearful for a few seconds and appears frustrated. Skin: Warm and dry.  Assessment & Plan: [name] was seen today for f/u er.  Diagnoses and all orders for this visit:  Diarrhea  Non-intractable vomiting with nausea, vomiting of unspecified type Orders: -     Discontinue: promethazine (PHENERGAN) injection 50 mg; Inject 2 mLs (50 mg total) into the muscle every 6 (six) hours as needed for nausea or vomiting. -     promethazine (PHENERGAN) injection 50 mg; Inject 2 mLs (50 mg total) into the muscle once.  Other orders -     prochlorperazine (COMPAZINE) 5 MG tablet; Take 1-2 tablets (5-10 mg total) by mouth every 6 (six) hours as needed for nausea or vomiting. -     promethazine (PHENERGAN) 25 MG tablet; Take 1 tablet (25 mg total) by mouth every 8 (eight) hours as needed for nausea or vomiting. -     diphenoxylate-atropine (LOMOTIL) 2.5-0.025 MG per tablet; Take 1 tablet by mouth 4 (four) times daily as needed for diarrhea or loose stools.   Compazine and Lomotil to help with symptoms. She appears hydrated today with the ability to make tears and mucous membranes being moist. No red flags to suggest that labs or imaging is needed to be repeated. I recommended that she follow-up with her gastroenterologist as soon as possible.Signs and symptoms requring emergent/urgent reevaluation were discussed with the patient.  25 minutes spent face-to-face during visit today of which at least 50% was counseling or coordinating care regarding: 1. Diarrhea   2. Non-intractable vomiting with nausea, vomiting of unspecified type      Return if symptoms worsen or fail to improve.

## 2014-09-29 ENCOUNTER — Telehealth: Payer: Self-pay

## 2014-09-29 DIAGNOSIS — D373 Neoplasm of uncertain behavior of appendix: Secondary | ICD-10-CM

## 2014-09-29 NOTE — Telephone Encounter (Signed)
Patient called requested a referral for [facility] with Dr. Silvio Pate. Patient stated that she was treated here for this issue before in the past. Oneta Rack

## 2014-09-30 NOTE — Telephone Encounter (Signed)
Referral has been placed. 

## 2014-11-04 ENCOUNTER — Ambulatory Visit (INDEPENDENT_AMBULATORY_CARE_PROVIDER_SITE_OTHER): Payer: 59 | Admitting: Sports Medicine

## 2014-11-04 ENCOUNTER — Encounter: Payer: Self-pay | Admitting: Sports Medicine

## 2014-11-04 VITALS — BP 124/87 | HR 85 | Ht 64.0 in | Wt 217.0 lb

## 2014-11-04 DIAGNOSIS — F4310 Post-traumatic stress disorder, unspecified: Secondary | ICD-10-CM | POA: Diagnosis not present

## 2014-11-04 DIAGNOSIS — B354 Tinea corporis: Secondary | ICD-10-CM | POA: Diagnosis not present

## 2014-11-04 DIAGNOSIS — J4541 Moderate persistent asthma with (acute) exacerbation: Secondary | ICD-10-CM | POA: Diagnosis not present

## 2014-11-04 DIAGNOSIS — D373 Neoplasm of uncertain behavior of appendix: Secondary | ICD-10-CM

## 2014-11-04 MED ORDER — FLUTICASONE FUROATE-VILANTEROL 100-25 MCG/INH IN AEPB: 1.0000 | INHALATION_SPRAY | Freq: Every day | RESPIRATORY_TRACT | Status: DC

## 2014-11-04 MED ORDER — PROCHLORPERAZINE MALEATE 5 MG PO TABS
5.0000 mg | ORAL_TABLET | Freq: Every day | ORAL | Status: DC
Start: 2014-11-04 — End: 2015-01-18

## 2014-11-04 MED ORDER — ALBUTEROL SULFATE HFA 108 (90 BASE) MCG/ACT IN AERS: 2.0000 | INHALATION_SPRAY | Freq: Four times a day (QID) | RESPIRATORY_TRACT | Status: DC | PRN

## 2014-11-04 MED ORDER — CLOTRIMAZOLE-BETAMETHASONE 1-0.05 % EX CREA: 1.0000 "application " | TOPICAL_CREAM | Freq: Two times a day (BID) | CUTANEOUS | Status: DC

## 2014-11-04 MED ORDER — ALPRAZOLAM 0.5 MG PO TABS: 0.5000 mg | ORAL_TABLET | Freq: Two times a day (BID) | ORAL | Status: DC | PRN

## 2014-11-04 NOTE — Assessment & Plan Note (Signed)
Right gluteal area, topical Lotrisone.

## 2014-11-04 NOTE — Assessment & Plan Note (Signed)
Patient has done very well with Compazine daily at bedtime.

## 2014-11-04 NOTE — Assessment & Plan Note (Signed)
Needs a refill on asthma medications. We are going to switch to [facility] with a discount coupon.

## 2014-11-04 NOTE — Progress Notes (Signed)
  Subjective:    CC: Follow-up  HPI: Asthma: Controlled but is now unable to afford her Symbicort. Needs a refill on albuterol as well  Anxiety depression, PTSD: Doing extremely well with therapy, Effexor, BuSpar, and an occasional Xanax controlled her symptoms well. 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  History of mucinous adenocarcinoma: Gets occasional nausea in the morning, has found that a Compazine taken at bedtime keeps her from being nauseated through the day. She does have regular follow-up with gastroenterology.  Skin rash: Right gluteal region, pruritic.  Past medical history, Surgical history, Family history not pertinant except as noted below, Social history, Allergies, and medications have been entered into the medical record, reviewed, and no changes needed.   Review of Systems: No fevers, chills, night sweats, weight loss, chest pain, or shortness of breath.   Objective:    General: Well Developed, well nourished, and in no acute distress.  Neuro: Alert and oriented x3, extra-ocular muscles intact, sensation grossly intact.  HEENT: Normocephalic, atraumatic, pupils equal round reactive to light, neck supple, no masses, no lymphadenopathy, thyroid nonpalpable.  Skin: Warm and dry, there is a fairly large well-defined, circular, scaly rash that appears to be tinea corporis. This is on the right gluteal cheek. Cardiac: Regular rate and rhythm, no murmurs rubs or gallops, no lower extremity edema.  Respiratory: Clear to auscultation bilaterally. Not using accessory muscles, speaking in full sentences.  Impression and Recommendations:

## 2014-11-04 NOTE — Assessment & Plan Note (Signed)
Doing extremely well with Effexor, BuSpar, therapy, and an occasional Xanax.

## 2014-11-07 ENCOUNTER — Encounter: Payer: Self-pay | Admitting: Gastroenterology

## 2014-12-13 ENCOUNTER — Encounter: Payer: Self-pay | Admitting: Sports Medicine

## 2014-12-13 ENCOUNTER — Ambulatory Visit (INDEPENDENT_AMBULATORY_CARE_PROVIDER_SITE_OTHER): Payer: 59 | Admitting: Sports Medicine

## 2014-12-13 DIAGNOSIS — M791 Myalgia: Secondary | ICD-10-CM

## 2014-12-13 DIAGNOSIS — M7918 Myalgia, other site: Secondary | ICD-10-CM

## 2014-12-13 MED ORDER — CYCLOBENZAPRINE HCL 10 MG PO TABS: ORAL_TABLET | ORAL | Status: DC

## 2014-12-13 NOTE — Progress Notes (Signed)
  Subjective:    CC: Fibromyalgia flare  HPI: This is a pleasant 44 year old female, she has myofascial pain syndrome that has been well-controlled with Effexor, when she gets flares she will exercise, or sleep. Unfortunately she is try this for the past month and has on been unable to shake the symptoms. Her symptoms are predominantly fatigue, as well as the expected bilateral shoulder, hip, back, and thigh pain. Symptoms are moderate, persistent. No trauma. No constitutional symptoms.  Past medical history, Surgical history, Family history not pertinant except as noted below, Social history, Allergies, and medications have been entered into the medical record, reviewed, and no changes needed.   Review of Systems: No fevers, chills, night sweats, weight loss, chest pain, or shortness of breath.   Objective:    General: Well Developed, well nourished, and in no acute distress.  Neuro: Alert and oriented x3, extra-ocular muscles intact, sensation grossly intact.  HEENT: Normocephalic, atraumatic, pupils equal round reactive to light, neck supple, no masses, no lymphadenopathy, thyroid nonpalpable.  Skin: Warm and dry, no rashes. Cardiac: Regular rate and rhythm, no murmurs rubs or gallops, no lower extremity edema.  Respiratory: Clear to auscultation bilaterally. Not using accessory muscles, speaking in full sentences.  Impression and Recommendations:

## 2014-12-13 NOTE — Assessment & Plan Note (Signed)
Currently in an acute flare with predominantly fatigue symptoms, there is a bit of pain as well. I am going to add Flexeril, and check some blood work.

## 2015-01-11 ENCOUNTER — Ambulatory Visit: Payer: 59 | Admitting: Sports Medicine

## 2015-01-18 ENCOUNTER — Ambulatory Visit (INDEPENDENT_AMBULATORY_CARE_PROVIDER_SITE_OTHER): Payer: 59 | Admitting: Sports Medicine

## 2015-01-18 ENCOUNTER — Encounter: Payer: Self-pay | Admitting: Sports Medicine

## 2015-01-18 VITALS — BP 123/80 | HR 79 | Ht 64.0 in | Wt 218.0 lb

## 2015-01-18 DIAGNOSIS — D373 Neoplasm of uncertain behavior of appendix: Secondary | ICD-10-CM

## 2015-01-18 DIAGNOSIS — M797 Fibromyalgia: Secondary | ICD-10-CM | POA: Diagnosis not present

## 2015-01-18 MED ORDER — PROMETHAZINE HCL 25 MG PO TABS: 25.0000 mg | ORAL_TABLET | Freq: Four times a day (QID) | ORAL | Status: DC | PRN

## 2015-01-18 MED ORDER — ONDANSETRON 8 MG PO TBDP
8.0000 mg | ORAL_TABLET | Freq: Three times a day (TID) | ORAL | Status: DC | PRN
Start: 2015-01-18 — End: 2015-04-21

## 2015-01-18 NOTE — Assessment & Plan Note (Signed)
Patient is doing her own elimination diet, this is acceptable. Symptoms are controlled, return as needed

## 2015-01-18 NOTE — Progress Notes (Signed)
  Subjective:    CC: follow-up  HPI: fibromyalgia: Doing well on Effexor, Flexeril, she has been trying various limitation diets and has noted an improvement in her symptoms. I have encouraged this. Depression is controlled, she is due for a colonoscopy.  Past medical history, Surgical history, Family history not pertinant except as noted below, Social history, Allergies, and medications have been entered into the medical record, reviewed, and no changes needed.   Review of Systems: No fevers, chills, night sweats, weight loss, chest pain, or shortness of breath.   Objective:    General: Well Developed, well nourished, and in no acute distress.  Neuro: Alert and oriented x3, extra-ocular muscles intact, sensation grossly intact.  HEENT: Normocephalic, atraumatic, pupils equal round reactive to light, neck supple, no masses, no lymphadenopathy, thyroid nonpalpable.  Skin: Warm and dry, no rashes. Cardiac: Regular rate and rhythm, no murmurs rubs or gallops, no lower extremity edema.  Respiratory: Clear to auscultation bilaterally. Not using accessory muscles, speaking in full sentences.  Impression and Recommendations:

## 2015-01-18 NOTE — Assessment & Plan Note (Signed)
Needs to reschedule gastroenterology, due for colonoscopy.

## 2015-02-17 ENCOUNTER — Ambulatory Visit (INDEPENDENT_AMBULATORY_CARE_PROVIDER_SITE_OTHER): Payer: 59 | Admitting: Sports Medicine

## 2015-02-17 VITALS — BP 122/91 | HR 99 | Temp 98.2°F | Wt 213.0 lb

## 2015-02-17 DIAGNOSIS — J329 Chronic sinusitis, unspecified: Secondary | ICD-10-CM | POA: Diagnosis not present

## 2015-02-17 DIAGNOSIS — J039 Acute tonsillitis, unspecified: Secondary | ICD-10-CM

## 2015-02-17 DIAGNOSIS — E039 Hypothyroidism, unspecified: Secondary | ICD-10-CM | POA: Diagnosis not present

## 2015-02-17 DIAGNOSIS — F4310 Post-traumatic stress disorder, unspecified: Secondary | ICD-10-CM | POA: Diagnosis not present

## 2015-02-17 LAB — POCT RAPID STREP A (OFFICE): Rapid Strep A Screen: POSITIVE — AB

## 2015-02-17 MED ORDER — PENICILLIN G BENZATHINE 1200000 UNIT/2ML IM SUSP
1.2000 10*6.[IU] | Freq: Once | INTRAMUSCULAR | Status: AC
  Administered 2015-02-17: 1.2 10*6.[IU] via INTRAMUSCULAR

## 2015-02-17 MED ORDER — ALPRAZOLAM 0.5 MG PO TABS: 0.5000 mg | ORAL_TABLET | Freq: Two times a day (BID) | ORAL | Status: DC | PRN

## 2015-02-17 MED ORDER — HYDROCODONE-HOMATROPINE 5-1.5 MG/5ML PO SYRP: 5.0000 mL | ORAL_SOLUTION | Freq: Three times a day (TID) | ORAL | Status: DC | PRN

## 2015-02-17 NOTE — Assessment & Plan Note (Signed)
Refilling Xanax

## 2015-02-17 NOTE — Progress Notes (Signed)
  Subjective:    CC: Sore throat  HPI: This is a pleasant 44 year old female with a several day history of increasing sore throat, mild cough, severe fevers up to 103 Fahrenheit.  Anxiety: Needs a refill on Xanax.  Past medical history, Surgical history, Family history not pertinant except as noted below, Social history, Allergies, and medications have been entered into the medical record, reviewed, and no changes needed.   Review of Systems: No fevers, chills, night sweats, weight loss, chest pain, or shortness of breath.   Objective:    General: Well Developed, well nourished, and in no acute distress.  Neuro: Alert and oriented x3, extra-ocular muscles intact, sensation grossly intact.  HEENT: Normocephalic, atraumatic, pupils equal round reactive to light, neck supple, no masses, thyroid nonpalpable. Nasopharynx, ear canals unremarkable, oropharynx shows enlarged tonsils with minimal exudates, she also has tender cervical lymphadenopathy. Skin: Warm and dry, no rashes. Cardiac: Regular rate and rhythm, no murmurs rubs or gallops, no lower extremity edema.  Respiratory: Clear to auscultation bilaterally. Not using accessory muscles, speaking in full sentences.  Rapid strep is positive, penicillin G 1.2 million units intramuscular here in the office.  Impression and Recommendations:

## 2015-02-17 NOTE — Assessment & Plan Note (Signed)
Checking immunoglobulins, CBC with differential, and ANCAs.

## 2015-02-17 NOTE — Assessment & Plan Note (Addendum)
Rechecking TSH TSH is elevated, starting levothyroxine 50 g, recheck in 6 weeks.

## 2015-02-17 NOTE — Assessment & Plan Note (Signed)
With a symptom score of 3, empiric treatment with 1.2 million units penicillin.

## 2015-03-01 ENCOUNTER — Other Ambulatory Visit: Payer: Self-pay | Admitting: Sports Medicine

## 2015-03-29 LAB — CBC WITH DIFFERENTIAL/PLATELET
Basophils Absolute: 0.1 K/uL (ref 0.0–0.1)
Basophils Relative: 1 % (ref 0–1)
Eosinophils Absolute: 0.7 10*3/uL (ref 0.0–0.7)
Eosinophils Relative: 8 % — ABNORMAL HIGH (ref 0–5)
HCT: 40 % (ref 36.0–46.0)
Hemoglobin: 13.6 g/dL (ref 12.0–15.0)
Lymphocytes Relative: 33 % (ref 12–46)
Lymphs Abs: 2.8 K/uL (ref 0.7–4.0)
MCH: 31 pg (ref 26.0–34.0)
MCHC: 34 g/dL (ref 30.0–36.0)
MCV: 91.1 fL (ref 78.0–100.0)
MPV: 10.7 fL (ref 8.6–12.4)
Monocytes Absolute: 0.6 10*3/uL (ref 0.1–1.0)
Monocytes Relative: 7 % (ref 3–12)
Neutro Abs: 4.3 K/uL (ref 1.7–7.7)
Neutrophils Relative %: 51 % (ref 43–77)
Platelets: 294 K/uL (ref 150–400)
RBC: 4.39 MIL/uL (ref 3.87–5.11)
RDW: 13.7 % (ref 11.5–15.5)
WBC: 8.5 10*3/uL (ref 4.0–10.5)

## 2015-03-29 LAB — T-HELPER CELLS (CD4) COUNT (NOT AT ARMC)
Absolute CD4: 1478 cells/uL — ABNORMAL HIGH (ref 381–1469)
CD4 T Helper %: 53 % (ref 32–62)
Total lymphocyte count: 2775 {cells}/uL (ref 700–3300)

## 2015-03-29 LAB — IGG, IGA, IGM
IgA: 347 mg/dL (ref 69–380)
IgG (Immunoglobin G), Serum: 1480 mg/dL (ref 690–1700)
IgM, Serum: 183 mg/dL (ref 52–322)

## 2015-03-29 LAB — TSH: TSH: 7.732 u[IU]/mL — ABNORMAL HIGH (ref 0.350–4.500)

## 2015-03-29 LAB — ANCA SCREEN W REFLEX TITER: ANCA Screen: NEGATIVE

## 2015-03-30 MED ORDER — LEVOTHYROXINE SODIUM 50 MCG PO TABS: 50.0000 ug | ORAL_TABLET | Freq: Every day | ORAL | Status: DC

## 2015-03-30 NOTE — Addendum Note (Signed)
Addended by: Silverio Decamp on: 03/30/2015 01:44 PM   Modules accepted: Orders

## 2015-04-21 ENCOUNTER — Ambulatory Visit (INDEPENDENT_AMBULATORY_CARE_PROVIDER_SITE_OTHER): Payer: BLUE CROSS/BLUE SHIELD | Admitting: Sports Medicine

## 2015-04-21 ENCOUNTER — Encounter: Payer: Self-pay | Admitting: Sports Medicine

## 2015-04-21 VITALS — BP 130/89 | HR 86 | Temp 98.1°F | Resp 18 | Wt 218.6 lb

## 2015-04-21 DIAGNOSIS — B354 Tinea corporis: Secondary | ICD-10-CM

## 2015-04-21 DIAGNOSIS — M797 Fibromyalgia: Secondary | ICD-10-CM | POA: Diagnosis not present

## 2015-04-21 DIAGNOSIS — E039 Hypothyroidism, unspecified: Secondary | ICD-10-CM

## 2015-04-21 DIAGNOSIS — Z23 Encounter for immunization: Secondary | ICD-10-CM | POA: Diagnosis not present

## 2015-04-21 MED ORDER — ALPRAZOLAM 0.5 MG PO TABS: 0.5000 mg | ORAL_TABLET | Freq: Two times a day (BID) | ORAL | Status: DC | PRN

## 2015-04-21 MED ORDER — TERBINAFINE HCL 250 MG PO TABS: 250.0000 mg | ORAL_TABLET | Freq: Every day | ORAL | Status: DC

## 2015-04-21 MED ORDER — CYCLOBENZAPRINE HCL 10 MG PO TABS: ORAL_TABLET | ORAL | Status: DC

## 2015-04-21 NOTE — Assessment & Plan Note (Signed)
Refilling Flexeril and alprazolam.

## 2015-04-21 NOTE — Assessment & Plan Note (Signed)
At this point there has been a failure of Lotrisone, we are going to try oral Lamisil, if this doesn't work we need to consider alternate diagnoses such as nummular eczema and guttate psoriasis. In one month if insufficient response we will biopsy the lesion

## 2015-04-21 NOTE — Assessment & Plan Note (Signed)
Still with some fatigue, need to recheck TSH in approximately one month, she has been on levothyroxine 50 g for 2 weeks now.

## 2015-04-21 NOTE — Progress Notes (Signed)
  Subjective:    CC:  Follow-up  HPI: Hypothyroidism:  Has only been on levothyroxine for 2 weeks now, not feeling any different yet.  Fibromyalgia: Needs a refill on Flexeril   skin rash: no response to Lotrisone. Present on right buttock.  Past medical history, Surgical history, Family history not pertinant except as noted below, Social history, Allergies, and medications have been entered into the medical record, reviewed, and no changes needed.   Review of Systems: No fevers, chills, night sweats, weight loss, chest pain, or shortness of breath.   Objective:    General: Well Developed, well nourished, and in no acute distress.  Neuro: Alert and oriented x3, extra-ocular muscles intact, sensation grossly intact.  HEENT: Normocephalic, atraumatic, pupils equal round reactive to light, neck supple, no masses, no lymphadenopathy, thyroid nonpalpable.  Skin: Warm and dry,  Scaly circular lesion present on right buttock in the infragluteal  fold. Cardiac: Regular rate and rhythm, no murmurs rubs or gallops, no lower extremity edema.  Respiratory: Clear to auscultation bilaterally. Not using accessory muscles, speaking in full sentences.  Impression and Recommendations:    I spent 25 minutes with this patient, greater than 50% was face-to-face time counseling regarding the above diagnoses

## 2015-05-01 MED FILL — ONDANSETRON ODT 8 MG TABLET: 8 | 6 days supply | Qty: 20 | Fill #0

## 2015-05-01 MED FILL — CYCLOBENZAPRINE 10 MG TAB: 10 | 10 days supply | Qty: 30 | Fill #0

## 2015-05-05 MED FILL — TERBINAFINE HCL 250 MG TAB: 250 | 30 days supply | Qty: 30 | Fill #0

## 2015-05-05 MED FILL — busPIRone HCL 15 MG TABS: 15 | 30 days supply | Qty: 90 | Fill #3

## 2015-05-08 ENCOUNTER — Ambulatory Visit (INDEPENDENT_AMBULATORY_CARE_PROVIDER_SITE_OTHER): Payer: BLUE CROSS/BLUE SHIELD | Admitting: Sports Medicine

## 2015-05-08 VITALS — BP 134/96 | HR 92 | Temp 98.2°F | Resp 18 | Wt 222.0 lb

## 2015-05-08 DIAGNOSIS — J329 Chronic sinusitis, unspecified: Secondary | ICD-10-CM | POA: Diagnosis not present

## 2015-05-08 DIAGNOSIS — E039 Hypothyroidism, unspecified: Secondary | ICD-10-CM

## 2015-05-08 DIAGNOSIS — F4310 Post-traumatic stress disorder, unspecified: Secondary | ICD-10-CM

## 2015-05-08 MED ORDER — HYDROCODONE-HOMATROPINE 5-1.5 MG/5ML PO SYRP: 5.0000 mL | ORAL_SOLUTION | Freq: Three times a day (TID) | ORAL | Status: DC | PRN

## 2015-05-08 MED ORDER — VENLAFAXINE HCL ER 150 MG PO CP24: 150.0000 mg | ORAL_CAPSULE | Freq: Every day | ORAL | Status: DC

## 2015-05-08 MED ORDER — AMOXICILLIN-POT CLAVULANATE 875-125 MG PO TABS: 1.0000 | ORAL_TABLET | Freq: Two times a day (BID) | ORAL | Status: DC

## 2015-05-08 MED FILL — HYDROCODONE-HOMATROPINE SYR: 5-1.5 | 5 days supply | Qty: 60 | Fill #0

## 2015-05-08 MED FILL — AMOX-CLAV 875-125 MG TABLET: 875-125 | 10 days supply | Qty: 20 | Fill #0

## 2015-05-08 MED FILL — VENLAFAXINE HCL ER 150 MG C: 150 | 90 days supply | Qty: 90 | Fill #0

## 2015-05-08 NOTE — Assessment & Plan Note (Addendum)
Due to recheck TSH on 50 g of levothyroxine.  TSH is in the upper limit of normal, increasing to 75 g. Recheck in 6 weeks.

## 2015-05-08 NOTE — Progress Notes (Signed)
  Subjective:    CC: cough  HPI: This is a pleasant 45 year old female with known recurrent sinusitis, for the past 3 weeks she's had increasing pain and pressure behind her maxillary sinuses. Mild cough, no shortness of breath or wheeze.  Depression: Currently on Effexor 75, still has moderate difficulty controlling her worry, feeling impending doom, and mild nervousness, worrying about different things, trouble relaxing, restlessness, and irritability, she also has moderate difficulty concentrating, mild anhedonia, depressed mood, difficulty sleeping, poor energy, guilt, no suicidal or homicidal ideation.  Hypothyroidism: Feels better after we started levothyroxine but due for a TSH recheck.  Past medical history, Surgical history, Family history not pertinant except as noted below, Social history, Allergies, and medications have been entered into the medical record, reviewed, and no changes needed.   Review of Systems: No fevers, chills, night sweats, weight loss, chest pain, or shortness of breath.   Objective:    General: Well Developed, well nourished, and in no acute distress.  Neuro: Alert and oriented x3, extra-ocular muscles intact, sensation grossly intact.  HEENT: Normocephalic, atraumatic, pupils equal round reactive to light, neck supple, no masses, no lymphadenopathy, thyroid nonpalpable. Tender to palpation over the frontal maxillary sinuses. Skin: Warm and dry, no rashes. Cardiac: Regular rate and rhythm, no murmurs rubs or gallops, no lower extremity edema.  Respiratory: Clear to auscultation bilaterally. Not using accessory muscles, speaking in full sentences.  Impression and Recommendations:

## 2015-05-08 NOTE — Assessment & Plan Note (Signed)
Need to go up on Effexor to 150.

## 2015-05-08 NOTE — Assessment & Plan Note (Signed)
Adding Augmentin, Hycodan

## 2015-05-11 MED FILL — LEVOTHYROXINE 50 MCG TABLET: 50 | 30 days supply | Qty: 30 | Fill #1

## 2015-05-19 ENCOUNTER — Encounter: Payer: Self-pay | Admitting: Sports Medicine

## 2015-05-19 ENCOUNTER — Ambulatory Visit (INDEPENDENT_AMBULATORY_CARE_PROVIDER_SITE_OTHER): Payer: BLUE CROSS/BLUE SHIELD | Admitting: Sports Medicine

## 2015-05-19 VITALS — BP 139/90 | HR 85 | Temp 98.2°F | Resp 18 | Wt 222.8 lb

## 2015-05-19 DIAGNOSIS — J329 Chronic sinusitis, unspecified: Secondary | ICD-10-CM | POA: Diagnosis not present

## 2015-05-19 DIAGNOSIS — B354 Tinea corporis: Secondary | ICD-10-CM

## 2015-05-19 LAB — TSH: TSH: 4 u[IU]/mL (ref 0.350–4.500)

## 2015-05-19 MED ORDER — AZITHROMYCIN 250 MG PO TABS: ORAL_TABLET | ORAL | Status: DC

## 2015-05-19 MED ORDER — LEVOTHYROXINE SODIUM 75 MCG PO TABS: 75.0000 ug | ORAL_TABLET | Freq: Every day | ORAL | Status: DC

## 2015-05-19 MED ORDER — HYDROCODONE-HOMATROPINE 5-1.5 MG/5ML PO SYRP: 5.0000 mL | ORAL_SOLUTION | Freq: Three times a day (TID) | ORAL | Status: DC | PRN

## 2015-05-19 MED FILL — HYDROCODONE-HOMATROPINE SYR: 5-1.5 | 4 days supply | Qty: 60 | Fill #0

## 2015-05-19 MED FILL — AZITHROMYCIN 250 MG TABLET: 250 | 5 days supply | Qty: 6 | Fill #0

## 2015-05-19 MED FILL — PROCHLORPERAZINE 5 MG TAB: 5 | 30 days supply | Qty: 30 | Fill #4

## 2015-05-19 NOTE — Assessment & Plan Note (Signed)
Completely resolved with Lamisil, she will complete the course.

## 2015-05-19 NOTE — Progress Notes (Signed)
  Subjective:    CC:  Follow-up  HPI: Rash: Now resolved with oral Lamisil , topical Lotrisone was ineffective..  Cough: With chronic sinusitis, insufficient response to oral Augmentin.  Past medical history, Surgical history, Family history not pertinant except as noted below, Social history, Allergies, and medications have been entered into the medical record, reviewed, and no changes needed.   Review of Systems: No fevers, chills, night sweats, weight loss, chest pain, or shortness of breath.   Objective:    General: Well Developed, well nourished, and in no acute distress.  Neuro: Alert and oriented x3, extra-ocular muscles intact, sensation grossly intact.  HEENT: Normocephalic, atraumatic, pupils equal round reactive to light, neck supple, no masses, no lymphadenopathy, thyroid nonpalpable.  Skin: Warm and dry, no rashes. Cardiac: Regular rate and rhythm, no murmurs rubs or gallops, no lower extremity edema.  Respiratory: Clear to auscultation bilaterally. Not using accessory muscles, speaking in full sentences.  Impression and Recommendations:

## 2015-05-19 NOTE — Addendum Note (Signed)
Addended by: Silverio Decamp on: 05/19/2015 04:46 PM   Modules accepted: Orders

## 2015-05-19 NOTE — Assessment & Plan Note (Signed)
Persistent cough and sinus pressure despite Augmentin, switching to azithromycin and refilling Hycodan. If persistent cough after this course she will go back to Dr. Joya Gaskins with pulmonology.

## 2015-05-29 MED FILL — CYCLOBENZAPRINE 10 MG TAB: 10 | 10 days supply | Qty: 30 | Fill #1

## 2015-06-02 ENCOUNTER — Ambulatory Visit (INDEPENDENT_AMBULATORY_CARE_PROVIDER_SITE_OTHER): Payer: BLUE CROSS/BLUE SHIELD | Admitting: Sports Medicine

## 2015-06-02 DIAGNOSIS — M797 Fibromyalgia: Secondary | ICD-10-CM | POA: Diagnosis not present

## 2015-06-02 MED ORDER — PREGABALIN 50 MG PO CAPS: 50.0000 mg | ORAL_CAPSULE | Freq: Three times a day (TID) | ORAL | Status: DC

## 2015-06-02 NOTE — Progress Notes (Signed)
  Subjective:    CC: Fibromyalgia pain  HPI: This is a pleasant 45 year female with fibromyalgia, she is initially well-controlled on Flexeril, Effexor, unfortunately having a flare with pain in her neck, shoulders, hips, buttocks. She is not on gabapentin or Lyrica. Symptoms are moderate, persistent. She tells me that her symptoms now feel different than her prior cervical radiculopathy.  Past medical history, Surgical history, Family history not pertinant except as noted below, Social history, Allergies, and medications have been entered into the medical record, reviewed, and no changes needed.   Review of Systems: No fevers, chills, night sweats, weight loss, chest pain, or shortness of breath.   Objective:    General: Well Developed, well nourished, and in no acute distress.  Neuro: Alert and oriented x3, extra-ocular muscles intact, sensation grossly intact.  HEENT: Normocephalic, atraumatic, pupils equal round reactive to light, neck supple, no masses, no lymphadenopathy, thyroid nonpalpable.  Skin: Warm and dry, no rashes. Cardiac: Regular rate and rhythm, no murmurs rubs or gallops, no lower extremity edema.  Respiratory: Clear to auscultation bilaterally. Not using accessory muscles, speaking in full sentences.  Impression and Recommendations:    I spent 25 minutes with this patient, greater than 50% was face-to-face time counseling regarding the above diagnoses

## 2015-06-02 NOTE — Assessment & Plan Note (Signed)
Was originally well controlled with Effexor, Flexeril, occasional . Having a flare, she tells me this feels significantly different from her prior cervical degenerative disc disease and radiculopathy symptoms that were controlled with an epidural. Adding Lyrica samples.  Return to see me in one month, I can always give her more Lyrica samples if needed.

## 2015-06-08 ENCOUNTER — Other Ambulatory Visit: Payer: Self-pay | Admitting: Sports Medicine

## 2015-06-08 MED FILL — busPIRone HCL 15 MG TABS: 15 | 30 days supply | Qty: 90 | Fill #0

## 2015-06-09 MED FILL — PROMETHAZINE 25 MG TABLET: 25 | 15 days supply | Qty: 45 | Fill #1

## 2015-06-29 MED FILL — CYCLOBENZAPRINE 10 MG TAB: 10 | 10 days supply | Qty: 30 | Fill #2

## 2015-06-30 ENCOUNTER — Encounter: Payer: Self-pay | Admitting: Sports Medicine

## 2015-06-30 ENCOUNTER — Ambulatory Visit (INDEPENDENT_AMBULATORY_CARE_PROVIDER_SITE_OTHER): Payer: BLUE CROSS/BLUE SHIELD | Admitting: Sports Medicine

## 2015-06-30 VITALS — BP 140/83 | HR 79 | Resp 18 | Wt 228.9 lb

## 2015-06-30 DIAGNOSIS — M797 Fibromyalgia: Secondary | ICD-10-CM | POA: Diagnosis not present

## 2015-06-30 DIAGNOSIS — E669 Obesity, unspecified: Secondary | ICD-10-CM | POA: Diagnosis not present

## 2015-06-30 MED ORDER — ALPRAZOLAM 0.5 MG PO TABS: 0.5000 mg | ORAL_TABLET | Freq: Two times a day (BID) | ORAL | Status: DC | PRN

## 2015-06-30 MED ORDER — PREGABALIN 50 MG PO CAPS: 50.0000 mg | ORAL_CAPSULE | Freq: Three times a day (TID) | ORAL | Status: DC

## 2015-06-30 MED FILL — LEVOTHYROXINE 75 MCG TABLET: 75 | 30 days supply | Qty: 30 | Fill #0

## 2015-06-30 MED FILL — ALPRAZolam 0.5 MG TABS: 0.5 | 15 days supply | Qty: 30 | Fill #0

## 2015-06-30 NOTE — Assessment & Plan Note (Signed)
Advised calories need to be cut back even further.  lack of weight loss simply means intake is greater than or equal to expenditure. If already exercising, needs to take in less calories. Thyroid function is now normal with current medication.

## 2015-06-30 NOTE — Addendum Note (Signed)
Addended by: Elizabeth Sauer on: 06/30/2015 10:37 AM   Modules accepted: Orders, Medications

## 2015-06-30 NOTE — Assessment & Plan Note (Signed)
Beautifully controlled  With Effexor, and the addition of Lyrica. Adding more Lyrica samples. Refilling alprazolam.

## 2015-06-30 NOTE — Progress Notes (Signed)
  Subjective:    CC:  Follow-up  HPI: Fibromyalgia: Fantastic response to the addition of 50 mg of Lyrica, symptoms are well controlled and she does not feel like she needs to go up on the dose.  Obesity: Is having trouble losing weight, exercising.  Past medical history, Surgical history, Family history not pertinant except as noted below, Social history, Allergies, and medications have been entered into the medical record, reviewed, and no changes needed.   Review of Systems: No fevers, chills, night sweats, weight loss, chest pain, or shortness of breath.   Objective:    General: Well Developed, well nourished, and in no acute distress.  Neuro: Alert and oriented x3, extra-ocular muscles intact, sensation grossly intact.  HEENT: Normocephalic, atraumatic, pupils equal round reactive to light, neck supple, no masses, no lymphadenopathy, thyroid nonpalpable.  Skin: Warm and dry, no rashes. Cardiac: Regular rate and rhythm, no murmurs rubs or gallops, no lower extremity edema.  Respiratory: Clear to auscultation bilaterally. Not using accessory muscles, speaking in full sentences.  Impression and Recommendations:    I spent 25 minutes with this patient, greater than 50% was face-to-face time counseling regarding the above diagnoses

## 2015-07-05 MED FILL — busPIRone HCL 15 MG TABS: 15 | 30 days supply | Qty: 90 | Fill #1

## 2015-07-13 MED FILL — VENTOLIN HFA 90 MCG INHALER: 108 (90 BAS | 25 days supply | Qty: 18 | Fill #1

## 2015-07-27 ENCOUNTER — Ambulatory Visit (INDEPENDENT_AMBULATORY_CARE_PROVIDER_SITE_OTHER): Payer: BLUE CROSS/BLUE SHIELD | Admitting: Family Medicine

## 2015-07-27 ENCOUNTER — Encounter: Payer: Self-pay | Admitting: Family Medicine

## 2015-07-27 VITALS — BP 130/73 | HR 81 | Temp 98.3°F | Wt 225.0 lb

## 2015-07-27 DIAGNOSIS — J029 Acute pharyngitis, unspecified: Secondary | ICD-10-CM | POA: Diagnosis not present

## 2015-07-27 LAB — POCT RAPID STREP A (OFFICE): Rapid Strep A Screen: NEGATIVE

## 2015-07-27 MED ORDER — PREDNISONE 10 MG PO TABS: 30.0000 mg | ORAL_TABLET | Freq: Every day | ORAL | Status: DC

## 2015-07-27 MED ORDER — IPRATROPIUM BROMIDE 0.06 % NA SOLN: 2.0000 | NASAL | Status: DC | PRN

## 2015-07-27 MED FILL — predniSONE 10 MG TABS: 10 | 5 days supply | Qty: 15 | Fill #0

## 2015-07-27 MED FILL — IPRATROPIUM 0.06% SPRAY: 0.06 | 7 days supply | Qty: 15 | Fill #0

## 2015-07-27 NOTE — Patient Instructions (Signed)
Thank you for coming in today. Call or go to the emergency room if you get worse, have trouble breathing, have chest pains, or palpitations.  Take prednisone if you worsen.  Use atrovent nasal spray.  Call or go to the emergency room if you get worse, have trouble breathing, have chest pains, or palpitations.   Pharyngitis Pharyngitis is redness, pain, and swelling (inflammation) of your pharynx.  CAUSES  Pharyngitis is usually caused by infection. Most of the time, these infections are from viruses (viral) and are part of a cold. However, sometimes pharyngitis is caused by bacteria (bacterial). Pharyngitis can also be caused by allergies. Viral pharyngitis may be spread from person to person by coughing, sneezing, and personal items or utensils (cups, forks, spoons, toothbrushes). Bacterial pharyngitis may be spread from person to person by more intimate contact, such as kissing.  SIGNS AND SYMPTOMS  Symptoms of pharyngitis include:   Sore throat.   Tiredness (fatigue).   Low-grade fever.   Headache.  Joint pain and muscle aches.  Skin rashes.  Swollen lymph nodes.  Plaque-like film on throat or tonsils (often seen with bacterial pharyngitis). DIAGNOSIS  Your health care provider will ask you questions about your illness and your symptoms. Your medical history, along with a physical exam, is often all that is needed to diagnose pharyngitis. Sometimes, a rapid strep test is done. Other lab tests may also be done, depending on the suspected cause.  TREATMENT  Viral pharyngitis will usually get better in 3-4 days without the use of medicine. Bacterial pharyngitis is treated with medicines that kill germs (antibiotics).  HOME CARE INSTRUCTIONS   Drink enough water and fluids to keep your urine clear or pale yellow.   Only take over-the-counter or prescription medicines as directed by your health care provider:   If you are prescribed antibiotics, make sure you finish them  even if you start to feel better.   Do not take aspirin.   Get lots of rest.   Gargle with 8 oz of salt water ( tsp of salt per 1 qt of water) as often as every 1-2 hours to soothe your throat.   Throat lozenges (if you are not at risk for choking) or sprays may be used to soothe your throat. SEEK MEDICAL CARE IF:   You have large, tender lumps in your neck.  You have a rash.  You cough up green, yellow-brown, or bloody spit. SEEK IMMEDIATE MEDICAL CARE IF:   Your neck becomes stiff.  You drool or are unable to swallow liquids.  You vomit or are unable to keep medicines or liquids down.  You have severe pain that does not go away with the use of recommended medicines.  You have trouble breathing (not caused by a stuffy nose). MAKE SURE YOU:   Understand these instructions.  Will watch your condition.  Will get help right away if you are not doing well or get worse.   This information is not intended to replace advice given to you by your health care provider. Make sure you discuss any questions you have with your health care provider.   Document Released: 04/01/2005 Document Revised: 01/20/2013 Document Reviewed: 12/07/2012 Elsevier Interactive Patient Education [employer].

## 2015-07-27 NOTE — Progress Notes (Signed)
[name] is a 45 y.o. female who presents to [place]: [facility] today for sore throat. Patient has a three-day history of sore throat associated with runny nose and congestion. She initially had fever and fatigue. Her symptoms are consistent with previous episodes of strep throat. She denies any significant sick contacts. She's tried Tylenol and naproxen which have helped. No vomiting or diarrhea.   Past Medical History  Diagnosis Date  . Asthma   . Depression   . Anxiety   . GERD (gastroesophageal reflux disease)   . Hypothyroidism   . Endometriosis   . History of sinus surgery 2010    MAXILLARY, ETHMOID, SPHENOID  . Obesity, Class III, BMI 40-49.9 (morbid obesity) ([place])   . SVD (spontaneous vaginal delivery)     x3  . Generalized headaches   . Adenomyosis 2013  . Kidney stones   . Complication of anesthesia     scoline pain? from use of Succinylcholine   . Anemia   . Iron deficiency anemia   . Cough   . Cancer of appendix ([place])   . Migraine    Past Surgical History  Procedure Laterality Date  . Knee surgery      right, scope and ACL repair  . Nasal sinus surgery  2010  . Colposcopy  2007  . Laparoscopy  05/30/2011    Procedure: LAPAROSCOPY OPERATIVE;  Surgeon: Darlyn Chamber, MD;  Location: [facility];  Service: Gynecology;  Laterality: N/A;  YAG  LASER of Endometriosis.  . Laparoscopic assisted vaginal hysterectomy  11/13/2011    Procedure: LAPAROSCOPIC ASSISTED VAGINAL HYSTERECTOMY;  Surgeon: Darlyn Chamber, MD;  Location: [facility];  Service: Gynecology;  Laterality: N/A;  . Abdominal hysterectomy    . Laparoscopic appendectomy  01/02/2012    Procedure: APPENDECTOMY LAPAROSCOPIC;  Surgeon: Harl Bowie, MD;  Location: [facility];  Service: General;  Laterality: N/A;  . Partial colectomy  01/27/2012  . Cholecystectomy  01/27/2012    Procedure: LAPAROSCOPIC  CHOLECYSTECTOMY;  Surgeon: Harl Bowie, MD;  Location: [facility];  Service: General;  Laterality: N/A;  Laparoscopic chole   Social History  Substance Use Topics  . Smoking status: Never Smoker   . Smokeless tobacco: Never Used  . Alcohol Use: No   family history includes Alcohol abuse in her brother and father; Asthma in her mother and sister; Colon cancer in her paternal grandmother; Depression in her brother, mother, sister, and sister; Drug abuse in her brother; Lung cancer in her father; Suicidality in her brother. There is no history of Anesthesia problems.  ROS as above Medications: Current Outpatient Prescriptions  Medication Sig Dispense Refill  . albuterol (PROVENTIL HFA;VENTOLIN HFA) 108 (90 BASE) MCG/ACT inhaler Inhale 2 puffs into the lungs every 6 (six) hours as needed for wheezing or shortness of breath. 1 Inhaler 3  . ALPRAZolam (XANAX) 0.5 MG tablet Take 1 tablet (0.5 mg total) by mouth 2 (two) times daily as needed for anxiety. 30 tablet 0  . busPIRone (BUSPAR) 15 MG tablet TAKE 1 TABLET (15 MG) BY MOUTH 3 TIMES A DAY. 90 tablet 3  . cyclobenzaprine (FLEXERIL) 10 MG tablet 1 tab by mouth up to 3 times a day. 30 tablet 11  . Fluticasone Furoate-Vilanterol (BREO ELLIPTA) 100-25 MCG/INH AEPB Inhale 1 puff into the lungs daily. 1 each 11  . levothyroxine (SYNTHROID, LEVOTHROID) 75 MCG tablet Take 1 tablet (75 mcg total) by mouth daily. 30 tablet 11  .  pregabalin (LYRICA) 50 MG capsule Take 1 capsule (50 mg total) by mouth 3 (three) times daily. 84 capsule 0  . venlafaxine XR (EFFEXOR XR) 150 MG 24 hr capsule Take 1 capsule (150 mg total) by mouth daily with breakfast. 90 capsule 3   No current facility-administered medications for this visit.   Allergies  Allergen Reactions  . Reglan [Metoclopramide] Other (See Comments)    Caused a dystonic reaction.  . Ciprofloxacin Nausea And Vomiting  . Clarithromycin Hives  . Moxifloxacin Hives     Exam:  BP 130/73 mmHg   Pulse 81  Temp(Src) 98.3 F (36.8 C) (Oral)  Wt 225 lb (102.059 kg)  SpO2 99%  LMP 11/02/2011 Gen: Well NAD HEENT: EOMI,  MMM Clear nasal discharge. Posterior pharynx with cobblestoning. No tonsillar exudate or erythema. No significant cervical lymphadenopathy. Lungs: Normal work of breathing. CTABL Heart: RRR no MRG Abd: NABS, Soft. Nondistended, Nontender Exts: Brisk capillary refill, warm and well perfused.    Point-of-care rapid strep test: Negative  No results found for this or any previous visit (from the past 24 hour(s)). No results found.   45 year old woman with viral pharyngitis. Continue symptomatic management with Tylenol. Use prednisone as needed. Use Atrovent nasal spray. Recheck if not better.

## 2015-07-31 ENCOUNTER — Other Ambulatory Visit: Payer: Self-pay | Admitting: Family Medicine

## 2015-07-31 MED FILL — LEVOTHYROXINE 75 MCG TABLET: 75 | 30 days supply | Qty: 30 | Fill #1

## 2015-07-31 MED FILL — CYCLOBENZAPRINE 10 MG TAB: 10 | 10 days supply | Qty: 30 | Fill #3

## 2015-07-31 MED FILL — busPIRone HCL 15 MG TABS: 15 | 30 days supply | Qty: 90 | Fill #2

## 2015-07-31 MED FILL — PROMETHAZINE 25 MG TABLET: 25 | 8 days supply | Qty: 30 | Fill #1

## 2015-09-14 MED FILL — PROMETHAZINE 25 MG TABLET: 25 | 8 days supply | Qty: 30 | Fill #2

## 2015-09-14 MED FILL — CYCLOBENZAPRINE 10 MG TAB: 10 | 10 days supply | Qty: 30 | Fill #4

## 2015-09-15 MED FILL — busPIRone HCL 15 MG TABS: 15 | 30 days supply | Qty: 90 | Fill #3

## 2015-09-20 ENCOUNTER — Encounter: Payer: Self-pay | Admitting: Sports Medicine

## 2015-09-20 ENCOUNTER — Ambulatory Visit (INDEPENDENT_AMBULATORY_CARE_PROVIDER_SITE_OTHER): Payer: BLUE CROSS/BLUE SHIELD | Admitting: Sports Medicine

## 2015-09-20 VITALS — BP 134/91 | HR 93 | Resp 18 | Wt 233.7 lb

## 2015-09-20 DIAGNOSIS — M797 Fibromyalgia: Secondary | ICD-10-CM

## 2015-09-20 DIAGNOSIS — F4310 Post-traumatic stress disorder, unspecified: Secondary | ICD-10-CM

## 2015-09-20 MED ORDER — VENLAFAXINE HCL ER 150 MG PO CP24: 150.0000 mg | ORAL_CAPSULE | Freq: Every day | ORAL | Status: DC

## 2015-09-20 MED ORDER — PREGABALIN 75 MG PO CAPS: 75.0000 mg | ORAL_CAPSULE | Freq: Two times a day (BID) | ORAL | Status: DC

## 2015-09-20 MED ORDER — ALPRAZOLAM 0.5 MG PO TABS: 0.5000 mg | ORAL_TABLET | Freq: Two times a day (BID) | ORAL | Status: DC | PRN

## 2015-09-20 MED ORDER — BUSPIRONE HCL 15 MG PO TABS: 15.0000 mg | ORAL_TABLET | Freq: Three times a day (TID) | ORAL | Status: DC

## 2015-09-20 NOTE — Progress Notes (Signed)
  Subjective:    CC: Follow-up  HPI: Fibromyalgia: Stable, does desire an increase in her Lyrica dose and needs refills on alprazolam, buspirone, Effexor.  Eustachian tube dysfunction: Left-sided, went swimming and now has pressure behind her ear, no constitutional symptoms.  Past medical history, Surgical history, Family history not pertinant except as noted below, Social history, Allergies, and medications have been entered into the medical record, reviewed, and no changes needed.   Review of Systems: No fevers, chills, night sweats, weight loss, chest pain, or shortness of breath.   Objective:    General: Well Developed, well nourished, and in no acute distress.  Neuro: Alert and oriented x3, extra-ocular muscles intact, sensation grossly intact.  HEENT: Normocephalic, atraumatic, pupils equal round reactive to light, neck supple, no masses, no lymphadenopathy, thyroid nonpalpable. Oropharynx, nasopharynx unremarkable, ear canals show left serous otitis media. Skin: Warm and dry, no rashes. Cardiac: Regular rate and rhythm, no murmurs rubs or gallops, no lower extremity edema.  Respiratory: Clear to auscultation bilaterally. Not using accessory muscles, speaking in full sentences.  Impression and Recommendations:    I spent 25 minutes with this patient, greater than 50% was face-to-face time counseling regarding the above diagnoses

## 2015-09-20 NOTE — Assessment & Plan Note (Signed)
Well controlled, needs a refill on medications, increasing Lyrica to 75 mg twice a day.

## 2015-09-28 MED FILL — LYRICA 75 MG CAPSULE: 75 | 30 days supply | Qty: 60 | Fill #0

## 2015-09-28 MED FILL — LEVOTHYROXINE 75 MCG TABLET: 75 | 30 days supply | Qty: 30 | Fill #2

## 2015-09-28 MED FILL — ALPRAZolam 0.5 MG TABS: 0.5 | 15 days supply | Qty: 30 | Fill #0

## 2015-10-26 MED FILL — PROMETHAZINE 25 MG TABLET: 25 | 8 days supply | Qty: 30 | Fill #3

## 2015-10-26 MED FILL — CYCLOBENZAPRINE 10 MG TAB: 10 | 10 days supply | Qty: 30 | Fill #5

## 2015-10-30 MED FILL — busPIRone HCL 15 MG TABS: 15 | 30 days supply | Qty: 90 | Fill #0

## 2015-10-30 MED FILL — LYRICA 75 MG CAPSULE: 75 | 30 days supply | Qty: 60 | Fill #1

## 2015-11-17 ENCOUNTER — Other Ambulatory Visit: Payer: Self-pay | Admitting: Sports Medicine

## 2015-11-17 ENCOUNTER — Ambulatory Visit: Payer: BLUE CROSS/BLUE SHIELD | Admitting: Family Medicine

## 2015-11-17 DIAGNOSIS — J4541 Moderate persistent asthma with (acute) exacerbation: Secondary | ICD-10-CM

## 2015-11-17 MED FILL — VENTOLIN HFA 90 MCG INHALER: 108 (90 BAS | 25 days supply | Qty: 18 | Fill #0

## 2015-11-23 ENCOUNTER — Ambulatory Visit (INDEPENDENT_AMBULATORY_CARE_PROVIDER_SITE_OTHER): Payer: BLUE CROSS/BLUE SHIELD | Admitting: Family Medicine

## 2015-11-23 VITALS — BP 119/82 | HR 104 | Resp 18 | Wt 226.0 lb

## 2015-11-23 DIAGNOSIS — J4541 Moderate persistent asthma with (acute) exacerbation: Secondary | ICD-10-CM | POA: Diagnosis not present

## 2015-11-23 DIAGNOSIS — F430 Acute stress reaction: Secondary | ICD-10-CM | POA: Diagnosis not present

## 2015-11-23 DIAGNOSIS — F4310 Post-traumatic stress disorder, unspecified: Secondary | ICD-10-CM

## 2015-11-23 DIAGNOSIS — Z87898 Personal history of other specified conditions: Secondary | ICD-10-CM | POA: Insufficient documentation

## 2015-11-23 DIAGNOSIS — K529 Noninfective gastroenteritis and colitis, unspecified: Secondary | ICD-10-CM | POA: Diagnosis not present

## 2015-11-23 DIAGNOSIS — E669 Obesity, unspecified: Secondary | ICD-10-CM

## 2015-11-23 DIAGNOSIS — T7421XD Adult sexual abuse, confirmed, subsequent encounter: Secondary | ICD-10-CM

## 2015-11-23 MED ORDER — ALPRAZOLAM 0.5 MG PO TABS: 0.5000 mg | ORAL_TABLET | Freq: Two times a day (BID) | ORAL | 0 refills | Status: DC | PRN

## 2015-11-23 MED ORDER — FLUTICASONE-SALMETEROL 250-50 MCG/DOSE IN AEPB: 1.0000 | INHALATION_SPRAY | Freq: Two times a day (BID) | RESPIRATORY_TRACT | 3 refills | Status: DC

## 2015-11-23 MED ORDER — VENLAFAXINE HCL ER 75 MG PO CP24: 75.0000 mg | ORAL_CAPSULE | Freq: Every day | ORAL | 1 refills | Status: DC

## 2015-11-23 MED ORDER — FLUOXETINE HCL 10 MG PO TABS: ORAL_TABLET | ORAL | 0 refills | Status: DC

## 2015-11-23 MED ORDER — ALBUTEROL SULFATE HFA 108 (90 BASE) MCG/ACT IN AERS: 1.0000 | INHALATION_SPRAY | RESPIRATORY_TRACT | 3 refills | Status: DC | PRN

## 2015-11-23 MED FILL — VENLAFAXINE HCL ER 75 MG CA: 75 | 30 days supply | Qty: 30 | Fill #0

## 2015-11-23 MED FILL — ADVAIR 250/50 DISKUS: 250-50 | 30 days supply | Qty: 60 | Fill #0

## 2015-11-23 MED FILL — FLUoxetine HCL 10 MG TABS: 10 | 30 days supply | Qty: 30 | Fill #0
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
## 2015-11-23 NOTE — Patient Instructions (Addendum)
I will see you in follow-up in 2-3 weeks to see how the med changes are going.   Make sure you had found a lawyer by then.  I want you to be exercising for 20-30 minutes every day minimal of moderate intensity speed walking

## 2015-11-24 MED FILL — ALPRAZolam 0.5 MG TABS: 0.5 | 15 days supply | Qty: 30 | Fill #0

## 2015-11-29 MED FILL — LYRICA 75 MG CAPSULE: 75 | 30 days supply | Qty: 60 | Fill #2

## 2015-12-13 ENCOUNTER — Ambulatory Visit (INDEPENDENT_AMBULATORY_CARE_PROVIDER_SITE_OTHER): Payer: BLUE CROSS/BLUE SHIELD | Admitting: Family Medicine

## 2015-12-13 ENCOUNTER — Encounter: Payer: Self-pay | Admitting: Family Medicine

## 2015-12-13 VITALS — BP 126/85 | HR 91 | Ht 64.0 in | Wt 224.8 lb

## 2015-12-13 DIAGNOSIS — M47812 Spondylosis without myelopathy or radiculopathy, cervical region: Secondary | ICD-10-CM

## 2015-12-13 DIAGNOSIS — M5412 Radiculopathy, cervical region: Secondary | ICD-10-CM | POA: Diagnosis not present

## 2015-12-13 DIAGNOSIS — T7421XS Adult sexual abuse, confirmed, sequela: Secondary | ICD-10-CM

## 2015-12-13 DIAGNOSIS — F4310 Post-traumatic stress disorder, unspecified: Secondary | ICD-10-CM

## 2015-12-13 MED ORDER — FLUOXETINE HCL 20 MG PO TABS: ORAL_TABLET | ORAL | 0 refills | Status: DC

## 2015-12-13 MED ORDER — AMITRIPTYLINE HCL 25 MG PO TABS: ORAL_TABLET | ORAL | 0 refills | Status: DC

## 2015-12-13 MED FILL — AMITRIPTYLINE HCL 25 MG TAB: 25 | 90 days supply | Qty: 180 | Fill #0

## 2015-12-13 MED FILL — FLUoxetine HCL 20 MG TABS: 20 | 90 days supply | Qty: 90 | Fill #0

## 2015-12-13 MED FILL — CYCLOBENZAPRINE 10 MG TAB: 10 | 10 days supply | Qty: 30 | Fill #6

## 2015-12-13 MED FILL — busPIRone HCL 15 MG TABS: 15 | 30 days supply | Qty: 90 | Fill #1
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
## 2015-12-13 NOTE — Patient Instructions (Addendum)
Look up "guided meditation for detachment from overthinking"  by Edman Circle on YouTube  Several others---> check it out!!    Insomnia Insomnia is a sleep disorder that makes it difficult to fall asleep or to stay asleep. Insomnia can cause tiredness (fatigue), low energy, difficulty concentrating, mood swings, and poor performance at work or school.  There are three different ways to classify insomnia:  Difficulty falling asleep.  Difficulty staying asleep.  Waking up too early in the morning. Any type of insomnia can be long-term (chronic) or short-term (acute). Both are common. Short-term insomnia usually lasts for three months or less. Chronic insomnia occurs at least three times a week for longer than three months. CAUSES  Insomnia may be caused by another condition, situation, or substance, such as:  Anxiety.  Certain medicines.  Gastroesophageal reflux disease (GERD) or other gastrointestinal conditions.  Asthma or other breathing conditions.  Restless legs syndrome, sleep apnea, or other sleep disorders.  Chronic pain.  Menopause. This may include hot flashes.  Stroke.  Abuse of alcohol, tobacco, or illegal drugs.  Depression.  Caffeine.   Neurological disorders, such as Alzheimer disease.  An overactive thyroid (hyperthyroidism). The cause of insomnia may not be known. RISK FACTORS Risk factors for insomnia include:  Gender. Women are more commonly affected than men.  Age. Insomnia is more common as you get older.  Stress. This may involve your professional or personal life.  Income. Insomnia is more common in people with lower income.  Lack of exercise.   Irregular work schedule or night shifts.  Traveling between different time zones. SIGNS AND SYMPTOMS If you have insomnia, trouble falling asleep or trouble staying asleep is the main symptom. This may lead to other symptoms, such as:  Feeling fatigued.  Feeling nervous about going to  sleep.  Not feeling rested in the morning.  Having trouble concentrating.  Feeling irritable, anxious, or depressed. TREATMENT  Treatment for insomnia depends on the cause. If your insomnia is caused by an underlying condition, treatment will focus on addressing the condition. Treatment may also include:   Medicines to help you sleep.  Counseling or therapy.  Lifestyle adjustments. HOME CARE INSTRUCTIONS   Take medicines only as directed by your health care provider.  Keep regular sleeping and waking hours. Avoid naps.  Keep a sleep diary to help you and your health care provider figure out what could be causing your insomnia. Include:   When you sleep.  When you wake up during the night.  How well you sleep.   How rested you feel the next day.  Any side effects of medicines you are taking.  What you eat and drink.   Make your bedroom a comfortable place where it is easy to fall asleep:  Put up shades or special blackout curtains to block light from outside.  Use a white noise machine to block noise.  Keep the temperature cool.   Exercise regularly as directed by your health care provider. Avoid exercising right before bedtime.  Use relaxation techniques to manage stress. Ask your health care provider to suggest some techniques that may work well for you. These may include:  Breathing exercises.  Routines to release muscle tension.  Visualizing peaceful scenes.  Cut back on alcohol, caffeinated beverages, and cigarettes, especially close to bedtime. These can disrupt your sleep.  Do not overeat or eat spicy foods right before bedtime. This can lead to digestive discomfort that can make it hard for you to sleep.  Limit screen use before bedtime. This includes:  Watching TV.  Using your smartphone, tablet, and computer.  Stick to a routine. This can help you fall asleep faster. Try to do a quiet activity, brush your teeth, and go to bed at the same  time each night.  Get out of bed if you are still awake after 15 minutes of trying to sleep. Keep the lights down, but try reading or doing a quiet activity. When you feel sleepy, go back to bed.  Make sure that you drive carefully. Avoid driving if you feel very sleepy.  Keep all follow-up appointments as directed by your health care provider. This is important. SEEK MEDICAL CARE IF:   You are tired throughout the day or have trouble in your daily routine due to sleepiness.  You continue to have sleep problems or your sleep problems get worse. SEEK IMMEDIATE MEDICAL CARE IF:   You have serious thoughts about hurting yourself or someone else.   This information is not intended to replace advice given to you by your health care provider. Make sure you discuss any questions you have with your health care provider.   Document Released: 03/29/2000 Document Revised: 12/21/2014 Document Reviewed: 12/31/2013 Elsevier Interactive Patient Education [employer].

## 2015-12-28 MED FILL — LYRICA 75 MG CAPSULE: 75 | 30 days supply | Qty: 60 | Fill #3

## 2016-01-11 ENCOUNTER — Ambulatory Visit: Payer: BLUE CROSS/BLUE SHIELD | Admitting: Family Medicine

## 2016-02-01 ENCOUNTER — Ambulatory Visit (INDEPENDENT_AMBULATORY_CARE_PROVIDER_SITE_OTHER): Payer: BLUE CROSS/BLUE SHIELD | Admitting: Family Medicine

## 2016-02-01 ENCOUNTER — Encounter: Payer: Self-pay | Admitting: Family Medicine

## 2016-02-01 VITALS — BP 131/90 | HR 78 | Temp 98.3°F | Ht 64.0 in | Wt 215.5 lb

## 2016-02-01 DIAGNOSIS — R062 Wheezing: Secondary | ICD-10-CM

## 2016-02-01 DIAGNOSIS — R509 Fever, unspecified: Secondary | ICD-10-CM | POA: Diagnosis not present

## 2016-02-01 DIAGNOSIS — J210 Acute bronchiolitis due to respiratory syncytial virus: Secondary | ICD-10-CM

## 2016-02-01 DIAGNOSIS — R0602 Shortness of breath: Secondary | ICD-10-CM | POA: Diagnosis not present

## 2016-02-01 DIAGNOSIS — J4541 Moderate persistent asthma with (acute) exacerbation: Secondary | ICD-10-CM

## 2016-02-01 MED ORDER — AZITHROMYCIN 250 MG PO TABS: ORAL_TABLET | ORAL | 0 refills | Status: DC

## 2016-02-01 MED ORDER — IPRATROPIUM-ALBUTEROL 0.5-2.5 (3) MG/3ML IN SOLN
3.0000 mL | Freq: Four times a day (QID) | RESPIRATORY_TRACT | Status: DC
  Administered 2016-02-01: 3 mL via RESPIRATORY_TRACT

## 2016-02-01 MED ORDER — HYDROCODONE-HOMATROPINE 5-1.5 MG/5ML PO SYRP: 5.0000 mL | ORAL_SOLUTION | Freq: Four times a day (QID) | ORAL | 0 refills | Status: DC | PRN

## 2016-02-01 MED ORDER — METHYLPREDNISOLONE ACETATE 80 MG/ML IJ SUSP
80.0000 mg | Freq: Once | INTRAMUSCULAR | Status: AC
  Administered 2016-02-01: 80 mg via INTRAMUSCULAR

## 2016-02-01 MED ORDER — PREDNISONE 10 MG (21) PO TBPK: ORAL_TABLET | ORAL | 0 refills | Status: DC

## 2016-02-01 MED FILL — predniSONE 10 MG TABS: 10 | 12 days supply | Qty: 48 | Fill #0

## 2016-02-01 MED FILL — AZITHROMYCIN 250 MG TABLET: 250 | 5 days supply | Qty: 6 | Fill #0
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
## 2016-02-01 NOTE — Patient Instructions (Signed)

## 2016-02-05 MED FILL — VENTOLIN HFA 90 MCG INHALER: 108 (90 BAS | 30 days supply | Qty: 18 | Fill #0 | Status: TO

## 2016-02-05 MED FILL — LEVOTHYROXINE 75 MCG TABLET: 75 | 30 days supply | Qty: 30 | Fill #3 | Status: TO

## 2016-02-08 ENCOUNTER — Telehealth: Payer: Self-pay | Admitting: Family Medicine

## 2016-02-08 NOTE — Telephone Encounter (Signed)
Patient still not feeling well and dealing with the same bad cough and wants advice, she can be reached at [phone]

## 2016-02-08 NOTE — Telephone Encounter (Signed)
Pt states that she still has a terrible cough with green sputum production, that causes her to vomit at times.  Denies fever, chills, nausea or wheezing.  Should pt be seen for recheck or can we send in RX?

## 2016-02-08 NOTE — Telephone Encounter (Signed)
Pt states that she has already taken the Zpack from 02/01/2016.  Advised pt that she would need to be seen for evaluation before any further treatment.  Pt expressed understanding and is agreeable.  Pt was transferred to front desk to schedule appt.  Charyl Bigger, CMA

## 2016-02-08 NOTE — Telephone Encounter (Signed)
As we discussed please. Thnx T

## 2016-02-08 NOTE — Telephone Encounter (Signed)
Pt is to use Z-pack that was previously sent in on 02/01/16, per Dr. Raliegh Scarlet.

## 2016-02-09 ENCOUNTER — Other Ambulatory Visit: Payer: Self-pay

## 2016-02-09 MED ORDER — BENZONATATE 200 MG PO CAPS: 200.0000 mg | ORAL_CAPSULE | Freq: Two times a day (BID) | ORAL | 0 refills | Status: DC | PRN

## 2016-02-09 MED FILL — BENZONATATE 200 MG CAPSULE: 200 | 15 days supply | Qty: 30 | Fill #0

## 2016-02-12 ENCOUNTER — Encounter: Payer: Self-pay | Admitting: Family Medicine

## 2016-02-12 ENCOUNTER — Ambulatory Visit (INDEPENDENT_AMBULATORY_CARE_PROVIDER_SITE_OTHER): Payer: BLUE CROSS/BLUE SHIELD | Admitting: Family Medicine

## 2016-02-12 VITALS — BP 131/91 | HR 91 | Temp 98.8°F | Ht 64.0 in | Wt 207.2 lb

## 2016-02-12 DIAGNOSIS — R05 Cough: Secondary | ICD-10-CM | POA: Diagnosis not present

## 2016-02-12 DIAGNOSIS — R059 Cough, unspecified: Secondary | ICD-10-CM

## 2016-02-12 MED ORDER — BENZONATATE 100 MG PO CAPS: 100.0000 mg | ORAL_CAPSULE | Freq: Three times a day (TID) | ORAL | 0 refills | Status: DC | PRN

## 2016-02-12 MED ORDER — HYDROCOD POLST-CPM POLST ER 10-8 MG/5ML PO SUER: 5.0000 mL | Freq: Two times a day (BID) | ORAL | 0 refills | Status: DC | PRN

## 2016-02-12 MED ORDER — PREDNISONE 20 MG PO TABS: ORAL_TABLET | ORAL | 0 refills | Status: DC

## 2016-02-12 MED FILL — BENZONATATE 100 MG CAPSULE: 100 | 10 days supply | Qty: 30 | Fill #0

## 2016-02-12 MED FILL — predniSONE 20 MG TABS: 20 | 8 days supply | Qty: 14 | Fill #0

## 2016-02-12 NOTE — Patient Instructions (Signed)

## 2016-02-12 NOTE — Progress Notes (Signed)
SUBJECTIVE:  [name] is a 45 y.o. female who complains of dry cough for many days. She was last seen 10\19\17 for similar symptoms of 4 days history and pt given Z-Pak, hycodan, pred 12 d taper and Tessalon Perles.   Had improvement- but now just with symptoms of lingering dry cough due to postnasal drip and head congestion.   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   + head congestion, PND, + HA occ.    She denies a history of chest pain, dizziness, fevers, nausea, sweats, wheezing and sputum production and has, denies and admits to a history of asthma. Patient denies smoke cigarettes. - never smoked. + RAD/  prematurity at birth,  Recent CXR about 80mo ago.  Declines one now.    OBJECTIVE: She appears well, vital signs are as noted. Ears normal.  Throat and pharynx normal.  Neck supple. No adenopathy in the neck. Nose is congested. Sinuses non tender. The chest is clear, without wheezes or rales.    ASSESSMENT:  viral upper respiratory illness  PLAN: Symptomatic therapy suggested: push fluids, rest, gargle warm salt water, use acetaminophen, antihistamine-decongestant of choice, cough suppressant of choice prn and return office visit prn if symptoms persist or worsen. Lack of antibiotic effectiveness discussed with her. Call or return to clinic prn if these symptoms worsen or fail to improve as anticipated. No orders of the defined types were placed in this encounter.   Blood pressure (!) 131/91, pulse 91, temperature 98.8 F (37.1 C), temperature source Oral, height 5\' 4"  (1.626 m), weight 207 lb 3.2 oz (94 kg), last menstrual period 11/02/2011.   Filed Weights   02/12/16 1344  Weight: 207 lb 3.2 oz (94 kg)

## 2016-02-15 MED FILL — ADVAIR 250/50 DISKUS: 250-50 | 30 days supply | Qty: 60 | Fill #1 | Status: TO

## 2016-03-15 ENCOUNTER — Other Ambulatory Visit: Payer: Self-pay | Admitting: Family Medicine

## 2016-03-15 MED FILL — busPIRone HCL 15 MG TABS: 15 | 30 days supply | Qty: 90 | Fill #2 | Status: TO

## 2016-03-15 MED FILL — FLUoxetine HCL 20 MG TABS: 20 | 90 days supply | Qty: 90 | Fill #0

## 2016-03-28 ENCOUNTER — Ambulatory Visit (INDEPENDENT_AMBULATORY_CARE_PROVIDER_SITE_OTHER): Payer: BLUE CROSS/BLUE SHIELD | Admitting: Family Medicine

## 2016-03-28 ENCOUNTER — Encounter: Payer: Self-pay | Admitting: Family Medicine

## 2016-03-28 VITALS — BP 115/82 | HR 92 | Ht 64.0 in | Wt 202.6 lb

## 2016-03-28 DIAGNOSIS — K644 Residual hemorrhoidal skin tags: Secondary | ICD-10-CM | POA: Diagnosis not present

## 2016-03-28 DIAGNOSIS — M797 Fibromyalgia: Secondary | ICD-10-CM

## 2016-03-28 DIAGNOSIS — Z1211 Encounter for screening for malignant neoplasm of colon: Secondary | ICD-10-CM | POA: Diagnosis not present

## 2016-03-28 DIAGNOSIS — Z130 Encounter for screening for diseases of the blood and blood-forming organs and certain disorders involving the immune mechanism: Secondary | ICD-10-CM

## 2016-03-28 DIAGNOSIS — Z23 Encounter for immunization: Secondary | ICD-10-CM | POA: Diagnosis not present

## 2016-03-28 DIAGNOSIS — E669 Obesity, unspecified: Secondary | ICD-10-CM | POA: Diagnosis not present

## 2016-03-28 DIAGNOSIS — F4310 Post-traumatic stress disorder, unspecified: Secondary | ICD-10-CM

## 2016-03-28 DIAGNOSIS — F410 Panic disorder [episodic paroxysmal anxiety] without agoraphobia: Secondary | ICD-10-CM

## 2016-03-28 DIAGNOSIS — E039 Hypothyroidism, unspecified: Secondary | ICD-10-CM

## 2016-03-28 DIAGNOSIS — K529 Noninfective gastroenteritis and colitis, unspecified: Secondary | ICD-10-CM

## 2016-03-28 MED ORDER — HYDROCORTISONE ACE-PRAMOXINE 1-1 % RE FOAM: 1.0000 | Freq: Three times a day (TID) | RECTAL | 1 refills | Status: DC

## 2016-03-28 MED ORDER — FLUOXETINE HCL 20 MG PO TABS: 40.0000 mg | ORAL_TABLET | Freq: Every day | ORAL | 3 refills | Status: DC

## 2016-03-28 MED ORDER — ALPRAZOLAM 0.5 MG PO TABS: 0.5000 mg | ORAL_TABLET | Freq: Two times a day (BID) | ORAL | 0 refills | Status: DC | PRN
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
## 2016-03-28 NOTE — Patient Instructions (Addendum)
Please come in fasting in the next couple weeks at your convenience for lab work. CBC, CMP, A1c, TSH, T4, vitamin D, fasting lipid profile.     About Hemorrhoids  Hemorrhoids are swollen veins in the lower rectum and anus.  Also called piles, hemorrhoids are a common problem.  Hemorrhoids may be internal (inside the rectum) or external (around the anus).  Internal Hemorrhoids  Internal hemorrhoids are often painless, but they rarely cause bleeding.  The internal veins may stretch and fall down (prolapse) through the anus to the outside of the body.  The veins may then become irritated and painful.  External Hemorrhoids  External hemorrhoids can be easily seen or felt around the anal opening.  They are under the skin around the anus.  When the swollen veins are scratched or broken by straining, rubbing or wiping they sometimes bleed.  How Hemorrhoids Occur  Veins in the rectum and around the anus tend to swell under pressure.  Hemorrhoids can result from increased pressure in the veins of your anus or rectum.  Some sources of pressure are:   Straining to have a bowel movement because of constipation  Waiting too long to have a bowel movement  Coughing and sneezing often  Sitting for extended periods of time, including on the toilet  Diarrhea  Obesity  Trauma or injury to the anus  Some liver diseases  Stress  Family history of hemorrhoids  Pregnancy  Pregnant women should try to avoid becoming constipated, because they are more likely to have hemorrhoids during pregnancy.  In the last trimester of pregnancy, the enlarged uterus may press on blood vessels and causes hemorrhoids.  In addition, the strain of childbirth sometimes causes hemorrhoids after the birth.  Symptoms of Hemorrhoids  Some symptoms of hemorrhoids include:  Swelling and/or a tender lump around the anus  Itching, mild burning and bleeding around the anus  Painful bowel movements with or without  constipation  Bright red blood covering the stool, on toilet paper or in the toilet bowel.   Symptoms usually go away within a few days.  Always talk to your doctor about any bleeding to make sure it is not from some other causes.  Diagnosing and Treating Hemorrhoids  Diagnosis is made by an examination by your healthcare provider.  Special test can be performed by your doctor.    Most cases of hemorrhoids can be treated with:  High-fiber diet: Eat more high-fiber foods, which help prevent constipation.  Ask for more detailed fiber information on types and sources of fiber from your healthcare provider.  Fluids: Drink plenty of water.  This helps soften bowel movements so they are easier to pass.  Sitz baths and cold packs: Sitting in lukewarm water two or three times a day for 15 minutes cleases the anal area and may relieve discomfort.  If the water is too hot, swelling around the anus will get worse.  Placing a cloth-covered ice pack on the anus for ten minutes four times a day can also help reduce selling.  Gently pushing a prolapsed hemorrhoid back inside after the bath or ice pack can be helpful.  Medications: For mild discomfort, your healthcare provider may suggest over-the-counter pain medication or prescribe a cream or ointment for topical use.  The cream may contain witch hazel, zinc oxide or petroleum jelly.  Medicated suppositories are also a treatment option.  Always consult your doctor before applying medications or creams.  Procedures and surgeries: There are also a number  of procedures and surgeries to shrink or remove hemorrhoids in more serious cases.  Talk to your physician about these options.  You can often prevent hemorrhoids or keep them from becoming worse by maintaining a healthy lifestyle.  Eat a fiber-rich diet of fruits, vegetables and whole grains.  Also, drink plenty of water and exercise regularly.   2007, [employer] Doc.30

## 2016-03-29 ENCOUNTER — Telehealth: Payer: Self-pay

## 2016-03-29 NOTE — Telephone Encounter (Signed)
Thank you :)

## 2016-03-29 NOTE — Telephone Encounter (Signed)
Pharmacy called stating that RX for proctofoam is not covered by pt's insurance.  They stated the only thing her insurance would cover is plain hydrocortisone 2.5% rectal cream.  Advised pharmacy they may change RX to this medication.  Charyl Bigger, CMA

## 2016-04-01 ENCOUNTER — Telehealth: Payer: Self-pay

## 2016-04-01 MED ORDER — ONDANSETRON HCL 8 MG PO TABS: 8.0000 mg | ORAL_TABLET | Freq: Three times a day (TID) | ORAL | 0 refills | Status: DC | PRN

## 2016-04-01 NOTE — Telephone Encounter (Signed)
Pt called stating that she ate some chicken last night with a group of people and believes she got food poisoning, since several people in her party became sick as well.  Per Dr. Raliegh Scarlet, RX for Zofran 8mg  #30 with no refills sent to pharmacy.  Charyl Bigger, CMA

## 2016-04-17 ENCOUNTER — Ambulatory Visit (INDEPENDENT_AMBULATORY_CARE_PROVIDER_SITE_OTHER): Payer: BLUE CROSS/BLUE SHIELD | Admitting: Sports Medicine

## 2016-04-17 ENCOUNTER — Ambulatory Visit (INDEPENDENT_AMBULATORY_CARE_PROVIDER_SITE_OTHER): Payer: BLUE CROSS/BLUE SHIELD

## 2016-04-17 DIAGNOSIS — M25561 Pain in right knee: Secondary | ICD-10-CM | POA: Diagnosis not present

## 2016-04-17 DIAGNOSIS — S8991XA Unspecified injury of right lower leg, initial encounter: Secondary | ICD-10-CM | POA: Insufficient documentation

## 2016-04-17 DIAGNOSIS — M25562 Pain in left knee: Secondary | ICD-10-CM

## 2016-04-17 MED ORDER — MELOXICAM 15 MG PO TABS: ORAL_TABLET | ORAL | 3 refills | Status: DC
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
## 2016-04-27 DIAGNOSIS — F410 Panic disorder [episodic paroxysmal anxiety] without agoraphobia: Secondary | ICD-10-CM | POA: Insufficient documentation

## 2016-04-27 NOTE — Assessment & Plan Note (Signed)
wil obtain vit D to ensure def not contributing to myalgias/ arthralgisas

## 2016-04-27 NOTE — Assessment & Plan Note (Signed)
Cont meds  labs

## 2016-04-27 NOTE — Assessment & Plan Note (Signed)
Will check labs

## 2016-04-27 NOTE — Assessment & Plan Note (Addendum)
Cont wt lose  Monitor food intake/ track everything if possible  Obtain labs

## 2016-04-27 NOTE — Assessment & Plan Note (Addendum)
Brat diet, reassuring exam; no red flags  zofran prn; f/up if NI/W

## 2016-04-29 ENCOUNTER — Telehealth: Payer: Self-pay

## 2016-04-29 NOTE — Telephone Encounter (Signed)
Pt called and thinks she has a stomach ulcer. Pt is complaining of having severe pain for the last 4 days with no fever. Pt states she is vomiting when she tries to eat along with a painful burning sensation in her stomach. Pt has a gastro appt on Friday but states they can not see her sooner. Pt stated she is going to go to the ER because she can't wait till Friday.

## 2016-05-03 ENCOUNTER — Encounter: Payer: Self-pay | Admitting: Physician Assistant

## 2016-05-03 ENCOUNTER — Ambulatory Visit (INDEPENDENT_AMBULATORY_CARE_PROVIDER_SITE_OTHER): Payer: BLUE CROSS/BLUE SHIELD | Admitting: Physician Assistant

## 2016-05-03 VITALS — BP 132/84 | Ht 64.0 in | Wt 194.0 lb

## 2016-05-03 DIAGNOSIS — R112 Nausea with vomiting, unspecified: Secondary | ICD-10-CM

## 2016-05-03 DIAGNOSIS — K219 Gastro-esophageal reflux disease without esophagitis: Secondary | ICD-10-CM | POA: Diagnosis not present

## 2016-05-03 DIAGNOSIS — N200 Calculus of kidney: Secondary | ICD-10-CM | POA: Diagnosis not present

## 2016-05-03 DIAGNOSIS — R197 Diarrhea, unspecified: Secondary | ICD-10-CM

## 2016-05-03 DIAGNOSIS — R109 Unspecified abdominal pain: Secondary | ICD-10-CM | POA: Diagnosis not present

## 2016-05-03 MED ORDER — TAMSULOSIN HCL 0.4 MG PO CAPS: 0.4000 mg | ORAL_CAPSULE | Freq: Every day | ORAL | 0 refills | Status: DC

## 2016-05-03 MED ORDER — PROMETHAZINE HCL 25 MG PO TABS: 25.0000 mg | ORAL_TABLET | Freq: Four times a day (QID) | ORAL | 0 refills | Status: DC | PRN

## 2016-05-03 MED ORDER — TRAMADOL HCL 50 MG PO TABS: 50.0000 mg | ORAL_TABLET | Freq: Four times a day (QID) | ORAL | 0 refills | Status: DC | PRN

## 2016-05-03 MED ORDER — NA SULFATE-K SULFATE-MG SULF 17.5-3.13-1.6 GM/177ML PO SOLN: 1.0000 | ORAL | 0 refills | Status: DC

## 2016-05-03 NOTE — Progress Notes (Signed)
Reviewed and agree with management plan. PCP follow up or Urology referral/follow up for nephrolithiasis.   Pricilla Riffle. Fuller Plan, MD [facility]

## 2016-05-03 NOTE — Progress Notes (Signed)
Chief Complaint: Right sided abdominal pain, Nausea and vomiting, Diarrhea  HPI:  [name] is a 46 year old Caucasian female with a past medical history of appendiceal cancer, depression, anxiety, GERD, endometriosis, iron deficiency anemia and kidney stones, who presents to clinic today with a complaint of right-sided abdominal pain, nausea and vomiting for the past 10 days.   Per our records patient previously followed with Dr. Fuller Plan in 2013. She had an EGD on 01/22/12 which showed mild gastritis in the antrum and gastric body. Colonoscopy was completed at the same time and showed a normal colon. Repeat was recommended in 2 years with more extensive bowel prep.   Per review of outside records patient was recently seen at [facility] in [place] on 04/29/16 for this same abdominal pain. She had a urinalysis which was positive for white blood cells and bacteria and was diagnosed with UTI. She was started on Keflex. She also had labs including a CMP which showed a potassium minimally decreased at 3.6 and was otherwise normal. CBC at that time was normal. Patient also had a CT of the abdomen and pelvis with contrast which was negative for an acute process but did show bilateral subcentimeter nephrolithiasis without evidence of obstruction and a previous appendectomy.   Today, the patient presents to clinic and is vomiting currently. She tells me that this all started "one day last week". She tells me it started with "unrelenting" nausea which went into the weekend and worsened. She then started vomiting on Monday "off and on". She tells me that she initially felt a generalized abdominal pain which has now moved over to her right side and wraps around to her back. Patient does describe a history of kidney stones and believes that she did pass one last night. She has had issues with this before but not for the past 5 years or so. She does not follow with a nephrologist and tells me she could not get  in with her PCP until next week. Patient was using Zofran 8 mg which was of no help. After her visit to the ER she started on Phenergan 25 mg which does help with her symptoms, but leaves her very tired, so she cannot take this if she is leaving the house. She does request refills of this today.   In the past the patient tells me she used Flomax to help pass her kidney stones and it did. She also used a combination of pain medicine. 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   Patient also describes constant diarrhea at least 6-7 times per day ever since 2013 after she had a partial colectomy. The patient tells me that she is missing her "jejunum" and half of her large intestine. She tells me that she has diarrhea after eating always. She has tried Lomotil in the past but does also have a history of a bowel obstruction at one time, so she tries to limit use of this medicine. She does remain on a low residue diet. Apparently her PCP has been working this up for years and finally referred her back to our clinic recently.   Patient also has a history of reflux which is controlled on Zantac 75 mg daily.   Associated symptoms include a weight loss of around 80 pounds since patient's surgery in 2013.    Patient denies fever, chills, blood in her stool, melena, change in diet recently, sick contacts, anorexia, dysphagia or symptoms that awaken her at night.  Past Medical History:  Diagnosis Date  . Adenomyosis 2013  . Anemia   . Anxiety   . Asthma   . Cancer of appendix ([place])   . Complication of anesthesia    scoline pain? from use of Succinylcholine   . Cough   . Depression   . Endometriosis   . Generalized headaches   . GERD (gastroesophageal reflux disease)   . History of sinus surgery 2010   MAXILLARY, ETHMOID, SPHENOID  . Hypothyroidism   . Iron deficiency anemia   . Kidney stones   . Migraine   . Obesity, Class III, BMI 40-49.9 (morbid obesity) ([place])   . SVD (spontaneous vaginal delivery)      x3    Past Surgical History:  Procedure Laterality Date  . ABDOMINAL HYSTERECTOMY    . CHOLECYSTECTOMY  01/27/2012   Procedure: LAPAROSCOPIC CHOLECYSTECTOMY;  Surgeon: Harl Bowie, MD;  Location: [facility];  Service: General;  Laterality: N/A;  Laparoscopic chole  . COLPOSCOPY  2007  . KNEE SURGERY     right, scope and ACL repair  . LAPAROSCOPIC APPENDECTOMY  01/02/2012   Procedure: APPENDECTOMY LAPAROSCOPIC;  Surgeon: Harl Bowie, MD;  Location: [facility];  Service: General;  Laterality: N/A;  . LAPAROSCOPIC ASSISTED VAGINAL HYSTERECTOMY  11/13/2011   Procedure: LAPAROSCOPIC ASSISTED VAGINAL HYSTERECTOMY;  Surgeon: Darlyn Chamber, MD;  Location: [facility];  Service: Gynecology;  Laterality: N/A;  . LAPAROSCOPY  05/30/2011   Procedure: LAPAROSCOPY OPERATIVE;  Surgeon: Darlyn Chamber, MD;  Location: [facility];  Service: Gynecology;  Laterality: N/A;  YAG  LASER of Endometriosis.  Marland Kitchen NASAL SINUS SURGERY  2010  . PARTIAL COLECTOMY  01/27/2012    Current Outpatient Prescriptions  Medication Sig Dispense Refill  . albuterol (VENTOLIN HFA) 108 (90 Base) MCG/ACT inhaler Inhale 1-2 puffs into the lungs every 4 (four) hours as needed for wheezing or shortness of breath. 18 g 3  . ALPRAZolam (XANAX) 0.5 MG tablet Take 1 tablet (0.5 mg total) by mouth 2 (two) times daily as needed for anxiety. 30 tablet 0  . busPIRone (BUSPAR) 15 MG tablet Take 1 tablet (15 mg total) by mouth 3 (three) times daily. 90 tablet 11  . cyclobenzaprine (FLEXERIL) 10 MG tablet Take 5 mg by mouth daily as needed.    Marland Kitchen FLUoxetine (PROZAC) 20 MG tablet Take 2 tablets (40 mg total) by mouth daily. 180 tablet 3  . Fluticasone-Salmeterol (ADVAIR DISKUS) 250-50 MCG/DOSE AEPB Inhale 1 puff into the lungs 2 (two) times daily. 1 each 3  . levothyroxine (SYNTHROID, LEVOTHROID) 75 MCG tablet Take 1 tablet (75 mcg total) by mouth daily. 30 tablet 11  . omeprazole (PRILOSEC) 20 MG capsule Take 20 mg by mouth daily.    . ondansetron  (ZOFRAN) 8 MG tablet Take 1 tablet (8 mg total) by mouth every 8 (eight) hours as needed for nausea or vomiting. 30 tablet 0  . ranitidine (ZANTAC) 150 MG tablet Take 150 mg by mouth daily.    . Na Sulfate-K Sulfate-Mg Sulf 17.5-3.13-1.6 GM/180ML SOLN Take 1 kit by mouth as directed. 354 mL 0  . promethazine (PHENERGAN) 25 MG tablet Take 1 tablet (25 mg total) by mouth every 6 (six) hours as needed for nausea or vomiting. 30 tablet 0  . tamsulosin (FLOMAX) 0.4 MG CAPS capsule Take 1 capsule (0.4 mg total) by mouth daily. 5 capsule 0  . traMADol (ULTRAM) 50 MG tablet Take 1 tablet (50 mg total) by mouth every 6 (six) hours  as needed. 15 tablet 0   No current facility-administered medications for this visit.     Allergies as of 05/03/2016 - Review Complete 05/03/2016  Allergen Reaction Noted  . Reglan [metoclopramide] Other (See Comments) 10/29/2011  . Ciprofloxacin Nausea And Vomiting 12/19/2011  . Clarithromycin Hives 05/07/2008  . Moxifloxacin Hives 05/07/2008    Family History  Problem Relation Age of Onset  . Lung cancer Father   . Alcohol abuse Father   . Depression Mother   . Asthma Mother   . Depression Sister   . Suicidality Brother   . Depression Brother   . Alcohol abuse Brother   . Drug abuse Brother   . Depression Sister   . Colon cancer Paternal Grandmother   . Asthma Sister   . Anesthesia problems Neg Hx     Social History   Social History  . Marital status: Married    Spouse name: N/A  . Number of children: 3  . Years of education: N/A   Occupational History  . RN [employer]   Social History Main Topics  . Smoking status: Never Smoker  . Smokeless tobacco: Never Used  . Alcohol use No  . Drug use: No  . Sexual activity: Yes    Partners: Male    Birth control/ protection: None   Other Topics Concern  . Not on file   Social History Narrative  . No narrative on file    Review of Systems:    Constitutional: Positive for weight loss and fatigue  No fever or chills Skin: No rash  Cardiovascular: No chest pain Respiratory: No SOB Gastrointestinal: See HPI and otherwise negative Genitourinary: Positive for decreased urine frequency No dysuria Neurological: No headache, dizziness or syncope Musculoskeletal: No new muscle or joint pain Hematologic: No bleeding Psychiatric: Positive for depression and anxiety    Physical Exam:  Vital signs: BP 132/84   Ht '5\' 4"'$  (1.626 m)   Wt 194 lb (88 kg)   LMP 11/02/2011   BMI 33.30 kg/m   Constitutional:   Pleasant Caucasian female appears to be in mild distress, Well developed, Well nourished, alert and cooperative Head:  Normocephalic and atraumatic. Eyes:   PEERL, EOMI. No icterus. Conjunctiva pink. Ears:  Normal auditory acuity. Neck:  Supple Throat: Oral cavity and pharynx without inflammation, swelling or lesion.  Respiratory: Respirations even and unlabored. Lungs clear to auscultation bilaterally.   No wheezes, crackles, or rhonchi.  Cardiovascular: Normal S1, S2. No MRG. Regular rate and rhythm. No peripheral edema, cyanosis or pallor.  Gastrointestinal:  Soft, nondistended, right upper quadrant and right flank pain as well as epigastric pain. No rebound or guarding. Normal bowel sounds. No appreciable masses or hepatomegaly. Rectal:  Not performed.  Msk:  Symmetrical without gross deformities. Without edema, no deformity or joint abnormality.  Neurologic:  Alert and  oriented x4;  grossly normal neurologically.  Skin:   Dry and intact without significant lesions or rashes. Psychiatric: Demonstrates good judgement and reason without abnormal affect or behaviors.  RELEVANT LABS AND IMAGING: See HPI  Assessment: 1. Nephrolithiasis: History of the same at least 5 years ago, passed a stone last night, CT showed bilateral nephrolithiasis, I believe this is giving her most of her acute symptoms today 2. Right upper quadrant/right flank pain: With above 3. Nausea and vomiting: With  above 4. GERD: Controlled on Zantac 75 mg daily 5. Diarrhea: Chronic for the patient ever since partial colectomy in 2013  Plan: 1. Discussed with the patient  I believe most of her acute symptoms are related to kidney stones, she did pass one last night and there was a note of kidney stone seen on bilateral sides on recent CT. Also all of her recent labs were normal. 2. Patient is due for a colonoscopy at this time for screening purposes. Will also schedule an EGD with this, the patient may call our clinic if acute symptoms resolve prior to procedures. Schedule these procedures with Dr. Fuller Plan in the [facility]. Discussed risks, benefits, limitations and alternatives and the patient agrees to proceed. 3. Prescribed Tramadol 50 mg, one tablet every 4-6 hours for pain, #15 with no refills 4. Prescribed Flomax 0.4 mg by mouth daily #5 with no refills 5. Refilled patient's Phenergan 25 mg every 8 hours for nausea #30 with 1 refill 6. Patient to follow with her PCP next week, hopefully Monday for further eval of her kidney stones if continues with pain 7. Patient follow in clinic for Dr. Lynne Leader recommendations or sooner if necessary. 8. Patient was advised to proceed to proceed to the ER if her pain increases or worsens over the weekend  Ellouise Newer, PA-C [facility] 05/03/2016, 11:44 AM  Cc: Mellody Dance, DO

## 2016-05-03 NOTE — Patient Instructions (Signed)
You have been scheduled for an endoscopy and colonoscopy. Please follow the written instructions given to you at your visit today. Please pick up your prep supplies at the pharmacy within the next 1-3 days. If you use inhalers (even only as needed), please bring them with you on the day of your procedure. Your physician has requested that you go to www.startemmi.com and enter the access code given to you at your visit today. This web site gives a general overview about your procedure. However, you should still follow specific instructions given to you by our office regarding your preparation for the procedure.  We have sent the following medications to your pharmacy for you to pick up at your convenience: Flomax 0.4 mg daily Phenergan 25 mg every 8 hrs prn Tramadol 50 mg every 6 hrs prn

## 2016-05-06 ENCOUNTER — Ambulatory Visit (INDEPENDENT_AMBULATORY_CARE_PROVIDER_SITE_OTHER): Payer: BLUE CROSS/BLUE SHIELD | Admitting: Family Medicine

## 2016-05-06 ENCOUNTER — Encounter: Payer: Self-pay | Admitting: Family Medicine

## 2016-05-06 VITALS — BP 121/84 | HR 89 | Temp 98.1°F | Ht 64.0 in | Wt 196.6 lb

## 2016-05-06 DIAGNOSIS — R109 Unspecified abdominal pain: Secondary | ICD-10-CM

## 2016-05-06 DIAGNOSIS — Z630 Problems in relationship with spouse or partner: Secondary | ICD-10-CM | POA: Insufficient documentation

## 2016-05-06 DIAGNOSIS — F410 Panic disorder [episodic paroxysmal anxiety] without agoraphobia: Secondary | ICD-10-CM

## 2016-05-06 DIAGNOSIS — R300 Dysuria: Secondary | ICD-10-CM

## 2016-05-06 DIAGNOSIS — F430 Acute stress reaction: Secondary | ICD-10-CM | POA: Diagnosis not present

## 2016-05-06 DIAGNOSIS — F4323 Adjustment disorder with mixed anxiety and depressed mood: Secondary | ICD-10-CM

## 2016-05-06 LAB — POCT URINALYSIS DIPSTICK
Bilirubin, UA: NEGATIVE
Blood, UA: NEGATIVE
GLUCOSE UA: NEGATIVE
Ketones, UA: NEGATIVE
LEUKOCYTES UA: NEGATIVE
NITRITE UA: NEGATIVE
PH UA: 7
Protein, UA: NEGATIVE
Spec Grav, UA: 1.01
Urobilinogen, UA: 0.2

## 2016-05-06 NOTE — Assessment & Plan Note (Signed)
-   Counseling done   - She will also call her counselor for CBT in near future-she hasn't gone over month.  - exercise  - prudent diet  - yoga/ meditation/  Focus on goals-  journal
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
## 2016-05-06 NOTE — Patient Instructions (Addendum)
  for her stress, you would feel much better if you felt more in control   Please get a journal and start writing down your goals. Write down specific ways you are going to achieve each goal.   # 1 get RN license, get lawyer advice etc.   -  Make an appointment with her cunselor   Make sure you exercise every day and do something fr [place] your self and your chilren your priority

## 2016-05-06 NOTE — Assessment & Plan Note (Signed)
Been well controlled on BuSpar, patient does not wish to change dose at this time  Stress management techniques discussed; go to counseling on regular basis

## 2016-05-06 NOTE — Assessment & Plan Note (Addendum)
Sent patient to urology per her request, as she is worried she needs another urethral dilatation or her interstitial cystitis may have flared.  Symptomatic control with Flomax in rare chance that her pain is coming from small nephrolithiasis- which I explained to patient is doubtful.

## 2016-05-06 NOTE — Assessment & Plan Note (Signed)
-   she does not wish to make changes to her medications at this time- doesn't feel she needs it.   Last office visit we increased from 20-40 of Prozac.

## 2016-05-14 ENCOUNTER — Encounter: Payer: Self-pay | Admitting: Sports Medicine

## 2016-05-14 ENCOUNTER — Ambulatory Visit (INDEPENDENT_AMBULATORY_CARE_PROVIDER_SITE_OTHER): Payer: BLUE CROSS/BLUE SHIELD | Admitting: Sports Medicine

## 2016-05-14 VITALS — BP 116/80 | HR 99 | Resp 16 | Wt 191.8 lb

## 2016-05-14 DIAGNOSIS — S8991XD Unspecified injury of right lower leg, subsequent encounter: Secondary | ICD-10-CM | POA: Diagnosis not present

## 2016-05-14 DIAGNOSIS — Z23 Encounter for immunization: Secondary | ICD-10-CM | POA: Diagnosis not present

## 2016-05-14 MED ORDER — NAPROXEN 500 MG PO TABS: 500.0000 mg | ORAL_TABLET | Freq: Two times a day (BID) | ORAL | 3 refills | Status: DC

## 2016-05-14 NOTE — Progress Notes (Signed)
  Subjective:    CC: Follow-up  HPI: Right knee pain: Resolved after injection earlier this month, was involved in some physical activity and reinjured her knee, pain is mostly at the medial joint line, and under the patella. Moderate, persistent. No mechanical symptoms.  Past medical history:  Negative.  See flowsheet/record as well for more information.  Surgical history: Negative.  See flowsheet/record as well for more information.  Family history: Negative.  See flowsheet/record as well for more information.  Social history: Negative.  See flowsheet/record as well for more information.  Allergies, and medications have been entered into the medical record, reviewed, and no changes needed.   Review of Systems: No fevers, chills, night sweats, weight loss, chest pain, or shortness of breath.   Objective:    General: Well Developed, well nourished, and in no acute distress.  Neuro: Alert and oriented x3, extra-ocular muscles intact, sensation grossly intact.  HEENT: Normocephalic, atraumatic, pupils equal round reactive to light, neck supple, no masses, no lymphadenopathy, thyroid nonpalpable.  Skin: Warm and dry, no rashes. Cardiac: Regular rate and rhythm, no murmurs rubs or gallops, no lower extremity edema.  Respiratory: Clear to auscultation bilaterally. Not using accessory muscles, speaking in full sentences. Right Knee: Normal to inspection with no erythema or effusion or obvious bony abnormalities. Tender to palpation at the medial joint line and the patellar facets ROM normal in flexion and extension and lower leg rotation. No pain with terminal flexion. Ligaments with solid consistent endpoints including ACL, PCL, LCL, MCL. Negative Mcmurray's and provocative meniscal tests. Non painful patellar compression. Patellar and quadriceps tendons unremarkable. Hamstring and quadriceps strength is normal.  Impression and Recommendations:    Right knee injury Initially did well  after the injection at the last visit, had a reinjury during physical activity. Hurting at the medial joint line, as well as patellar facets. Switching to naproxen 500, adding a reaction knee brace, meniscal rehabilitation exercises. Return in 4 weeks, MRI if no better.  I spent 25 minutes with this patient, greater than 50% was face-to-face time counseling regarding the above diagnoses

## 2016-05-14 NOTE — Assessment & Plan Note (Signed)
Initially did well after the injection at the last visit, had a reinjury during physical activity. Hurting at the medial joint line, as well as patellar facets. Switching to naproxen 500, adding a reaction knee brace, meniscal rehabilitation exercises. Return in 4 weeks, MRI if no better.

## 2016-05-14 NOTE — Addendum Note (Signed)
Addended by: Elizabeth Sauer on: 05/14/2016 10:44 AM   Modules accepted: Orders

## 2016-05-22 ENCOUNTER — Ambulatory Visit (INDEPENDENT_AMBULATORY_CARE_PROVIDER_SITE_OTHER): Payer: BLUE CROSS/BLUE SHIELD | Admitting: Adult Health

## 2016-05-22 ENCOUNTER — Encounter: Payer: Self-pay | Admitting: Adult Health

## 2016-05-22 VITALS — BP 109/77 | HR 83 | Temp 98.0°F | Ht 64.0 in | Wt 197.6 lb

## 2016-05-22 DIAGNOSIS — Z720 Tobacco use: Secondary | ICD-10-CM

## 2016-05-22 DIAGNOSIS — R05 Cough: Secondary | ICD-10-CM

## 2016-05-22 DIAGNOSIS — J0100 Acute maxillary sinusitis, unspecified: Secondary | ICD-10-CM | POA: Diagnosis not present

## 2016-05-22 DIAGNOSIS — H6691 Otitis media, unspecified, right ear: Secondary | ICD-10-CM | POA: Insufficient documentation

## 2016-05-22 DIAGNOSIS — R059 Cough, unspecified: Secondary | ICD-10-CM

## 2016-05-22 MED ORDER — FLUTICASONE PROPIONATE 50 MCG/ACT NA SUSP: 2.0000 | Freq: Every day | NASAL | 6 refills | Status: DC

## 2016-05-22 MED ORDER — AMOXICILLIN-POT CLAVULANATE 875-125 MG PO TABS: 1.0000 | ORAL_TABLET | Freq: Two times a day (BID) | ORAL | 0 refills | Status: DC

## 2016-05-22 MED ORDER — PROMETHAZINE-DM 6.25-15 MG/5ML PO SYRP: 5.0000 mL | ORAL_SOLUTION | Freq: Every evening | ORAL | 0 refills | Status: DC | PRN
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
## 2016-05-22 NOTE — Patient Instructions (Signed)
Cough, Adult Introduction A cough helps to clear your throat and lungs. A cough may last only 2-3 weeks (acute), or it may last longer than 8 weeks (chronic). Many different things can cause a cough. A cough may be a sign of an illness or another medical condition. Follow these instructions at home:  Pay attention to any changes in your cough.  Take medicines only as told by your doctor.  If you were prescribed an antibiotic medicine, take it as told by your doctor. Do not stop taking it even if you start to feel better.  Talk with your doctor before you try using a cough medicine.  Drink enough fluid to keep your pee (urine) clear or pale yellow.  If the air is dry, use a cold steam vaporizer or humidifier in your home.  Stay away from things that make you cough at work or at home.  If your cough is worse at night, try using extra pillows to raise your head up higher while you sleep.  Do not smoke, and try not to be around smoke. If you need help quitting, ask your doctor.  Do not have caffeine.  Do not drink alcohol.  Rest as needed. Contact a doctor if:  You have new problems (symptoms).  You cough up yellow fluid (pus).  Your cough does not get better after 2-3 weeks, or your cough gets worse.  Medicine does not help your cough and you are not sleeping well.  You have pain that gets worse or pain that is not helped with medicine.  You have a fever.  You are losing weight and you do not know why.  You have night sweats. Get help right away if:  You cough up blood.  You have trouble breathing.  Your heartbeat is very fast. This information is not intended to replace advice given to you by your health care provider. Make sure you discuss any questions you have with your health care provider. Document Released: 12/13/2010 Document Revised: 09/07/2015 Document Reviewed: 06/08/2014  2017 Elsevier Otitis Media, Adult Otitis media is redness, soreness, and  puffiness (swelling) in the space just behind your eardrum (middle ear). It may be caused by allergies or infection. It often happens along with a cold. Follow these instructions at home:  Take your medicine as told. Finish it even if you start to feel better.  Only take over-the-counter or prescription medicines for pain, discomfort, or fever as told by your doctor.  Follow up with your doctor as told. Contact a doctor if:  You have otitis media only in one ear, or bleeding from your nose, or both.  You notice a lump on your neck.  You are not getting better in 3-5 days.  You feel worse instead of better. Get help right away if:  You have pain that is not helped with medicine.  You have puffiness, redness, or pain around your ear.  You get a stiff neck.  You cannot move part of your face (paralysis).  You notice that the bone behind your ear hurts when you touch it. This information is not intended to replace advice given to you by your health care provider. Make sure you discuss any questions you have with your health care provider. Document Released: 09/18/2007 Document Revised: 09/07/2015 Document Reviewed: 10/27/2012 Elsevier Interactive Patient Education  2017 Elsevier Inc. Sinusitis, Adult Sinusitis is soreness and inflammation of your sinuses. Sinuses are hollow spaces in the bones around your face. They are located:  Around your eyes.  In the middle of your forehead.  Behind your nose.  In your cheekbones. Your sinuses and nasal passages are lined with a stringy fluid (mucus). Mucus normally drains out of your sinuses. When your nasal tissues get inflamed or swollen, the mucus can get trapped or blocked so air cannot flow through your sinuses. This lets bacteria, viruses, and funguses grow, and that leads to infection. Follow these instructions at home: Medicines  Take, use, or apply over-the-counter and prescription medicines only as told by your doctor. These  may include nasal sprays.  If you were prescribed an antibiotic medicine, take it as told by your doctor. Do not stop taking the antibiotic even if you start to feel better. Hydrate and Humidify  Drink enough water to keep your pee (urine) clear or pale yellow.  Use a cool mist humidifier to keep the humidity level in your home above 50%.  Breathe in steam for 10-15 minutes, 3-4 times a day or as told by your doctor. You can do this in the bathroom while a hot shower is running.  Try not to spend time in cool or dry air. Rest  Rest as much as possible.  Sleep with your head raised (elevated).  Make sure to get enough sleep each night. General instructions  Put a warm, moist washcloth on your face 3-4 times a day or as told by your doctor. This will help with discomfort.  Wash your hands often with soap and water. If there is no soap and water, use hand sanitizer.  Do not smoke. Avoid being around people who are smoking (secondhand smoke).  Keep all follow-up visits as told by your doctor. This is important. Contact a doctor if:  You have a fever.  Your symptoms get worse.  Your symptoms do not get better within 10 days. Get help right away if:  You have a very bad headache.  You cannot stop throwing up (vomiting).  You have pain or swelling around your face or eyes.  You have trouble seeing.  You feel confused.  Your neck is stiff.  You have trouble breathing. This information is not intended to replace advice given to you by your health care provider. Make sure you discuss any questions you have with your health care provider. Document Released: 09/18/2007 Document Revised: 11/26/2015 Document Reviewed: 01/25/2015 Elsevier Interactive Patient Education  2017 [employer].  Take medications as directed. Increase water/rest/vit c. Alternate OTC Acetaminophen and Ibuprofen as directed. Stop Tobacco use. If symptoms persist after antibiotic completed, then  please return for re-check.

## 2016-05-22 NOTE — Assessment & Plan Note (Addendum)
Flonase, Augmentin Rx's sent in. Increase fluids/rest/vit c. Stop tobacco use!Marland Kitchen

## 2016-05-22 NOTE — Assessment & Plan Note (Signed)
Counseled on Tobacco use and instructed to stop immediately.  She estimates only smoking 3-4 cigarettes daily (stress r/t to divorce).

## 2016-05-22 NOTE — Assessment & Plan Note (Signed)
Augmentin Rx sent in. Increase fluids/rest/vit c. Stop tobacco use!Marland Kitchen

## 2016-06-07 ENCOUNTER — Ambulatory Visit (INDEPENDENT_AMBULATORY_CARE_PROVIDER_SITE_OTHER): Payer: BLUE CROSS/BLUE SHIELD | Admitting: Physician Assistant

## 2016-06-07 ENCOUNTER — Ambulatory Visit: Payer: BLUE CROSS/BLUE SHIELD | Admitting: Physician Assistant

## 2016-06-07 ENCOUNTER — Ambulatory Visit: Payer: BLUE CROSS/BLUE SHIELD | Admitting: Sports Medicine

## 2016-06-07 VITALS — BP 118/84 | HR 81 | Wt 187.0 lb

## 2016-06-07 DIAGNOSIS — H60502 Unspecified acute noninfective otitis externa, left ear: Secondary | ICD-10-CM

## 2016-06-07 MED ORDER — CIPROFLOXACIN-DEXAMETHASONE 0.3-0.1 % OT SUSP: 4.0000 [drp] | Freq: Two times a day (BID) | OTIC | 0 refills | Status: DC

## 2016-06-07 MED ORDER — TRAMADOL-ACETAMINOPHEN 37.5-325 MG PO TABS: 1.0000 | ORAL_TABLET | Freq: Three times a day (TID) | ORAL | 0 refills | Status: DC | PRN

## 2016-06-07 NOTE — Patient Instructions (Addendum)

## 2016-06-07 NOTE — Progress Notes (Signed)
HPI:                                                                [name] is a 46 y.o. female who presents to [facility]: [facility] today for ear pain  Patient endorses left-sided ear pain x 1 week. Denies fever, chills. Denies congestion, sore throat, cough. She has a history of sinus surgery for chronic sinusitis. She also reports she had a right-sided otitis media approximately 3 weeks ago and completed antibiotic therapy.  Past Medical History:  Diagnosis Date  . Adenomyosis 2013  . Anemia   . Anxiety   . Asthma   . Cancer of appendix ([place])   . Complication of anesthesia    scoline pain? from use of Succinylcholine   . Cough   . Depression   . Endometriosis   . Generalized headaches   . GERD (gastroesophageal reflux disease)   . History of sinus surgery 2010   MAXILLARY, ETHMOID, SPHENOID  . Hypothyroidism   . Iron deficiency anemia   . Kidney stones   . Migraine   . Obesity, Class III, BMI 40-49.9 (morbid obesity) ([place])   . SVD (spontaneous vaginal delivery)    x3   Past Surgical History:  Procedure Laterality Date  . ABDOMINAL HYSTERECTOMY    . CHOLECYSTECTOMY  01/27/2012   Procedure: LAPAROSCOPIC CHOLECYSTECTOMY;  Surgeon: Harl Bowie, MD;  Location: [facility];  Service: General;  Laterality: N/A;  Laparoscopic chole  . COLPOSCOPY  2007  . KNEE SURGERY     right, scope and ACL repair  . LAPAROSCOPIC APPENDECTOMY  01/02/2012   Procedure: APPENDECTOMY LAPAROSCOPIC;  Surgeon: Harl Bowie, MD;  Location: [facility];  Service: General;  Laterality: N/A;  . LAPAROSCOPIC ASSISTED VAGINAL HYSTERECTOMY  11/13/2011   Procedure: LAPAROSCOPIC ASSISTED VAGINAL HYSTERECTOMY;  Surgeon: Darlyn Chamber, MD;  Location: [facility];  Service: Gynecology;  Laterality: N/A;  . LAPAROSCOPY  05/30/2011   Procedure: LAPAROSCOPY OPERATIVE;  Surgeon: Darlyn Chamber, MD;  Location: [facility];  Service: Gynecology;  Laterality: N/A;  YAG  LASER  of Endometriosis.  Marland Kitchen NASAL SINUS SURGERY  2010  . PARTIAL COLECTOMY  01/27/2012   Social History  Substance Use Topics  . Smoking status: Never Smoker  . Smokeless tobacco: Never Used  . Alcohol use No   family history includes Alcohol abuse in her brother and father; Asthma in her mother and sister; Colon cancer in her paternal grandmother; Depression in her brother, mother, sister, and sister; Drug abuse in her brother; Lung cancer in her father; Suicidality in her brother.  ROS: negative except as noted in the HPI  Medications: Current Outpatient Prescriptions  Medication Sig Dispense Refill  . albuterol (VENTOLIN HFA) 108 (90 Base) MCG/ACT inhaler Inhale 1-2 puffs into the lungs every 4 (four) hours as needed for wheezing or shortness of breath. 18 g 3  . ALPRAZolam (XANAX) 0.5 MG tablet Take 1 tablet (0.5 mg total) by mouth 2 (two) times daily as needed for anxiety. 30 tablet 0  . busPIRone (BUSPAR) 15 MG tablet Take 1 tablet (15 mg total) by mouth 3 (three) times daily. 90 tablet 11  . FLUoxetine (PROZAC) 20 MG tablet Take 2 tablets (40 mg total)  by mouth daily. 180 tablet 3  . fluticasone (FLONASE) 50 MCG/ACT nasal spray Place 2 sprays into both nostrils daily. 16 g 6  . Fluticasone-Salmeterol (ADVAIR DISKUS) 250-50 MCG/DOSE AEPB Inhale 1 puff into the lungs 2 (two) times daily. 1 each 3  . levothyroxine (SYNTHROID, LEVOTHROID) 75 MCG tablet Take 1 tablet (75 mcg total) by mouth daily. 30 tablet 11  . naproxen (NAPROSYN) 500 MG tablet Take 1 tablet (500 mg total) by mouth 2 (two) times daily with a meal. 60 tablet 3  . omeprazole (PRILOSEC) 20 MG capsule Take 20 mg by mouth daily.    . promethazine (PHENERGAN) 25 MG tablet Take 1 tablet (25 mg total) by mouth every 6 (six) hours as needed for nausea or vomiting. 30 tablet 0  . ranitidine (ZANTAC) 150 MG tablet Take 150 mg by mouth daily.     No current facility-administered medications for this visit.    Allergies  Allergen  Reactions  . Reglan [Metoclopramide] Other (See Comments)    Caused a dystonic reaction.  . Ciprofloxacin Nausea And Vomiting  . Clarithromycin Hives  . Moxifloxacin Hives       Objective:  BP 118/84   Pulse 81   Wt 187 lb (84.8 kg)   LMP 11/02/2011   BMI 32.10 kg/m  Gen: well-groomed, cooperative, not ill-appearing, no distress HEENT: normal conjunctiva, right canal and TM clear, left auricle and tragus tender, left canal with purulent drainage, left TM intact, no mastoid tenderness Neuro: alert and oriented x 3, EOM's intact Lymph: no preauricular or postauricular adenopathy Skin: warm and dry, no rashes or lesions on exposed skin, no cyanosis   No results found for this or any previous visit (from the past 72 hour(s)). No results found.    Assessment and Plan: 46 y.o. female with   1. Acute otitis externa of left ear, unspecified type - patient has an allergy to Moxifloxacin in her chart, but states she has tolerated oral Ciprofloxacin well in the past - traMADol-acetaminophen (ULTRACET) 37.5-325 MG tablet; Take 1-2 tablets by mouth every 8 (eight) hours as needed for severe pain.  Dispense: 15 tablet; Refill: 0 - ciprofloxacin-dexamethasone (CIPRODEX) otic suspension; Place 4 drops into the left ear 2 (two) times daily.  Dispense: 7.5 mL; Refill: 0   Patient education and anticipatory guidance given Patient agrees with treatment plan Follow-up as needed if symptoms worsen or fail to improve  Darlyne Russian PA-C

## 2016-06-10 ENCOUNTER — Telehealth: Payer: Self-pay | Admitting: *Deleted

## 2016-06-10 NOTE — Telephone Encounter (Signed)
I just received PA on ciprodex because this medication is not a covered medication.. Just wanted to let you know since this medication was for an acute issue and most likely the patient did pick this medication up.

## 2016-06-11 ENCOUNTER — Ambulatory Visit (INDEPENDENT_AMBULATORY_CARE_PROVIDER_SITE_OTHER): Payer: BLUE CROSS/BLUE SHIELD | Admitting: Sports Medicine

## 2016-06-11 ENCOUNTER — Ambulatory Visit (INDEPENDENT_AMBULATORY_CARE_PROVIDER_SITE_OTHER): Payer: BLUE CROSS/BLUE SHIELD | Admitting: Physician Assistant

## 2016-06-11 ENCOUNTER — Encounter: Payer: Self-pay | Admitting: Sports Medicine

## 2016-06-11 ENCOUNTER — Encounter: Payer: Self-pay | Admitting: Physician Assistant

## 2016-06-11 VITALS — BP 129/89 | HR 85 | Wt 186.0 lb

## 2016-06-11 DIAGNOSIS — S8991XD Unspecified injury of right lower leg, subsequent encounter: Secondary | ICD-10-CM | POA: Diagnosis not present

## 2016-06-11 DIAGNOSIS — E039 Hypothyroidism, unspecified: Secondary | ICD-10-CM

## 2016-06-11 DIAGNOSIS — Z1239 Encounter for other screening for malignant neoplasm of breast: Secondary | ICD-10-CM

## 2016-06-11 DIAGNOSIS — F4310 Post-traumatic stress disorder, unspecified: Secondary | ICD-10-CM | POA: Diagnosis not present

## 2016-06-11 DIAGNOSIS — E669 Obesity, unspecified: Secondary | ICD-10-CM

## 2016-06-11 DIAGNOSIS — Z1231 Encounter for screening mammogram for malignant neoplasm of breast: Secondary | ICD-10-CM

## 2016-06-11 DIAGNOSIS — M4722 Other spondylosis with radiculopathy, cervical region: Secondary | ICD-10-CM | POA: Diagnosis not present

## 2016-06-11 DIAGNOSIS — Z0000 Encounter for general adult medical examination without abnormal findings: Secondary | ICD-10-CM

## 2016-06-11 MED ORDER — PREDNISONE 50 MG PO TABS: ORAL_TABLET | ORAL | 0 refills | Status: DC

## 2016-06-11 MED ORDER — CYCLOBENZAPRINE HCL 10 MG PO TABS: ORAL_TABLET | ORAL | 0 refills | Status: DC

## 2016-06-11 NOTE — Assessment & Plan Note (Signed)
Has done extremely well about 3-4 years ago with left-sided cervical epidurals. Unfortunately her hair and she has a recurrence of left-sided periscapular type radicular pain. Continue rehabilitation exercises, I will add prednisone and Flexeril. Return to see me in one month if needed for an MRI and epidural planning.
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
## 2016-06-11 NOTE — Assessment & Plan Note (Signed)
Knee pain has improved with switching to naproxen 500 as well as a new reaction knee brace and meniscal rehabilitation exercises.
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
## 2016-06-18 ENCOUNTER — Encounter: Payer: Self-pay | Admitting: Gastroenterology

## 2016-07-01 ENCOUNTER — Telehealth: Payer: Self-pay | Admitting: Gastroenterology

## 2016-07-01 NOTE — Telephone Encounter (Signed)
Please charge  

## 2016-07-02 ENCOUNTER — Encounter: Payer: BLUE CROSS/BLUE SHIELD | Admitting: Gastroenterology

## 2016-07-04 ENCOUNTER — Ambulatory Visit (INDEPENDENT_AMBULATORY_CARE_PROVIDER_SITE_OTHER): Payer: BLUE CROSS/BLUE SHIELD | Admitting: Physician Assistant

## 2016-07-04 VITALS — BP 127/87 | HR 96 | Wt 188.0 lb

## 2016-07-04 DIAGNOSIS — F410 Panic disorder [episodic paroxysmal anxiety] without agoraphobia: Secondary | ICD-10-CM

## 2016-07-04 DIAGNOSIS — S060X0A Concussion without loss of consciousness, initial encounter: Secondary | ICD-10-CM | POA: Diagnosis not present

## 2016-07-04 DIAGNOSIS — F4310 Post-traumatic stress disorder, unspecified: Secondary | ICD-10-CM

## 2016-07-04 MED ORDER — ALPRAZOLAM 0.5 MG PO TABS: 0.5000 mg | ORAL_TABLET | Freq: Two times a day (BID) | ORAL | 0 refills | Status: DC | PRN

## 2016-07-04 MED ORDER — ONDANSETRON 4 MG PO TBDP: 4.0000 mg | ORAL_TABLET | Freq: Once | ORAL | Status: DC

## 2016-07-04 MED ORDER — ONDANSETRON HCL 4 MG PO TABS: 4.0000 mg | ORAL_TABLET | Freq: Three times a day (TID) | ORAL | 0 refills | Status: DC | PRN

## 2016-07-04 NOTE — Progress Notes (Signed)
HPI:                                                                [name] is a 46 y.o. female who presents to [facility] today for headache  Patient reports around 11pm last night abruptly lifting and turning her head and striking the door of her washer/dryer unit on her left temple. She reports headache, nausea, and light-sensitivity. She also reports slower cognitive function. She denies loss of consciousness, paresthesias, or focal weakness. Denies intractable vomiting or vision change.   Past Medical History:  Diagnosis Date  . Adenomyosis 2013  . Anemia   . Anxiety   . Asthma   . Cancer of appendix ([place])   . Complication of anesthesia    scoline pain? from use of Succinylcholine   . Cough   . Depression   . Endometriosis   . Generalized headaches   . GERD (gastroesophageal reflux disease)   . History of sinus surgery 2010   MAXILLARY, ETHMOID, SPHENOID  . Hypothyroidism   . Iron deficiency anemia   . Kidney stones   . Migraine   . Obesity, Class III, BMI 40-49.9 (morbid obesity) ([place])   . SVD (spontaneous vaginal delivery)    x3   Past Surgical History:  Procedure Laterality Date  . ABDOMINAL HYSTERECTOMY    . CHOLECYSTECTOMY  01/27/2012   Procedure: LAPAROSCOPIC CHOLECYSTECTOMY;  Surgeon: Harl Bowie, MD;  Location: [facility];  Service: General;  Laterality: N/A;  Laparoscopic chole  . COLPOSCOPY  2007  . KNEE SURGERY     right, scope and ACL repair  . LAPAROSCOPIC APPENDECTOMY  01/02/2012   Procedure: APPENDECTOMY LAPAROSCOPIC;  Surgeon: Harl Bowie, MD;  Location: [facility];  Service: General;  Laterality: N/A;  . LAPAROSCOPIC ASSISTED VAGINAL HYSTERECTOMY  11/13/2011   Procedure: LAPAROSCOPIC ASSISTED VAGINAL HYSTERECTOMY;  Surgeon: Darlyn Chamber, MD;  Location: [facility];  Service: Gynecology;  Laterality: N/A;  . LAPAROSCOPY  05/30/2011   Procedure: LAPAROSCOPY OPERATIVE;  Surgeon: Darlyn Chamber,  MD;  Location: [facility];  Service: Gynecology;  Laterality: N/A;  YAG  LASER of Endometriosis.  Marland Kitchen NASAL SINUS SURGERY  2010  . PARTIAL COLECTOMY  01/27/2012   Social History  Substance Use Topics  . Smoking status: Never Smoker  . Smokeless tobacco: Never Used  . Alcohol use No   family history includes Alcohol abuse in her brother and father; Asthma in her mother and sister; Colon cancer in her paternal grandmother; Depression in her brother, mother, sister, and sister; Drug abuse in her brother; Lung cancer in her father; Suicidality in her brother.  ROS: negative except as noted in the HPI  Medications: Current Outpatient Prescriptions  Medication Sig Dispense Refill  . albuterol (VENTOLIN HFA) 108 (90 Base) MCG/ACT inhaler Inhale 1-2 puffs into the lungs every 4 (four) hours as needed for wheezing or shortness of breath. 18 g 3  . ALPRAZolam (XANAX) 0.5 MG tablet Take 1 tablet (0.5 mg total) by mouth 2 (two) times daily as needed for anxiety. 30 tablet 0  . busPIRone (BUSPAR) 15 MG tablet Take 1 tablet (15 mg total) by mouth 3 (three) times daily. 90 tablet 11  . ciprofloxacin-dexamethasone (CIPRODEX) otic  suspension Place 4 drops into the left ear 2 (two) times daily. 7.5 mL 0  . cyclobenzaprine (FLEXERIL) 10 MG tablet One half tab PO qHS, then increase gradually to one tab TID. 30 tablet 0  . FLUoxetine (PROZAC) 20 MG tablet Take 2 tablets (40 mg total) by mouth daily. 180 tablet 3  . fluticasone (FLONASE) 50 MCG/ACT nasal spray Place 2 sprays into both nostrils daily. 16 g 6  . Fluticasone-Salmeterol (ADVAIR DISKUS) 250-50 MCG/DOSE AEPB Inhale 1 puff into the lungs 2 (two) times daily. 1 each 3  . levothyroxine (SYNTHROID, LEVOTHROID) 75 MCG tablet Take 1 tablet (75 mcg total) by mouth daily. 30 tablet 11  . omeprazole (PRILOSEC) 20 MG capsule Take 20 mg by mouth daily.    . predniSONE (DELTASONE) 50 MG tablet One tab PO daily for 5 days. 5 tablet 0  . promethazine (PHENERGAN) 25 MG  tablet Take 1 tablet (25 mg total) by mouth every 6 (six) hours as needed for nausea or vomiting. 30 tablet 0  . ranitidine (ZANTAC) 150 MG tablet Take 150 mg by mouth daily.    . traMADol-acetaminophen (ULTRACET) 37.5-325 MG tablet Take 1-2 tablets by mouth every 8 (eight) hours as needed for severe pain. 15 tablet 0   No current facility-administered medications for this visit.    Allergies  Allergen Reactions  . Reglan [Metoclopramide] Other (See Comments)    Caused a dystonic reaction.  . Ciprofloxacin Nausea And Vomiting  . Clarithromycin Hives  . Moxifloxacin Hives       Objective:  BP 127/87   Pulse 96   Wt 188 lb (85.3 kg)   LMP 11/02/2011   BMI 32.27 kg/m  Gen: well-groomed, not ill-appearing, no acute distress HEENT: head normocephalic, atraumatic; no visible bruising or edema of the left temple or orbit, normal conjunctiva Pulm: Normal work of breathing, normal phonation,  Neuro:  cranial nerves II-XII intact, no nystagmus, no papilledema, normal finger-to-nose, normal heel-to-shin, negative pronator drift, normal coordination, DTR's intact, normal tone, no tremor MSK: strength 5/5 and symmetric in bilateral upper and lower extremities, normal gait and station Mental Status: alert and oriented x 3, normal speech, cooperative, organized thought content  No results found for this or any previous visit (from the past 72 hour(s)). No results found.    Assessment and Plan: 46 y.o. female with   1. Concussion without loss of consciousness, initial encounter - reassuring neurologic exam. I do not think imaging is necessary at this time.  - physical and cognitive rest x 1 week - Zofran as needed for nausea - discussed red flag symptoms warranting emergent follow-up - ondansetron (ZOFRAN-ODT) disintegrating tablet 4 mg; Take 1 tablet (4 mg total) by mouth once. - follow-up with Sports Medicine in 1 week  2. PTSD (post-traumatic stress disorder) with anxiety and  depression - checked  database. Last fill date was 03/25/2016 - refilled 30 tabs. Next fill in 3 months - ALPRAZolam (XANAX) 0.5 MG tablet; Take 1 tablet (0.5 mg total) by mouth 2 (two) times daily as needed for anxiety.  Dispense: 30 tablet; Refill: 0  Patient education and anticipatory guidance given Patient agrees with treatment plan Follow-up in 1 week with Sports Med or sooner as needed  Darlyne Russian PA-C

## 2016-07-04 NOTE — Patient Instructions (Addendum)
Physical and cognitive rest x 1 week No screens Zofran as needed for nausea Follow-up with Dr. Darene Lamer in 1 week  Concussion, Adult A concussion is a brain injury from a direct hit (blow) to the head or body. This blow causes the brain to shake quickly back and forth inside the skull. This can damage brain cells and cause chemical changes in the brain. A concussion may also be known as a mild traumatic brain injury (TBI). Concussions are usually not life-threatening, but the effects of a concussion can be serious. If you have a concussion, you are more likely to experience concussion-like symptoms after a direct blow to the head in the future. What are the causes? This condition is caused by:  A direct blow to the head, such as from running into another player during a game, being hit in a fight, or hitting your head on a hard surface.  A jolt of the head or neck that causes the brain to move back and forth inside the skull, such as in a car crash. What are the signs or symptoms? The signs of a concussion can be hard to notice. Early on, they may be missed by you, family members, and health care providers. You may look fine but act or feel differently. Symptoms are usually temporary, but they may last for days, weeks, or even longer. Some symptoms may appear right away but other symptoms may not show up for hours or days. Every head injury is different. Symptoms may include:  Headaches. This can include a feeling of pressure in the head.  Memory problems.  Trouble concentrating, organizing, or making decisions.  Slowness in thinking, acting or reacting, speaking, or reading.  Confusion.  Fatigue.  Changes in eating or sleeping patterns.  Problems with coordination or balance.  Nausea or vomiting.  Numbness or tingling.  Sensitivity to light or noise.  Vision or hearing problems.  Reduced sense of smell.  Irritability or mood changes.  Dizziness.  Lack of  motivation.  Seeing or hearing things that other people do not see or hear (hallucinations). How is this diagnosed? This condition is diagnosed based on:  Your symptoms.  A description of your injury. You may also have tests, including:  Imaging tests, such as a CT scan or MRI. These are done to look for signs of brain injury.  Neuropsychological tests. These measure your thinking, understanding, learning, and remembering abilities. How is this treated? This condition is treated with physical and mental rest and careful observation, usually at home. If the concussion is severe, you may need to stay home from work for a while. You may be referred to a concussion clinic or to other health care providers for management. It is important that you tell your health care provider if:  You are taking any medicines, including prescription medicines, over-the-counter medicines, and natural remedies. Some medicines, such as blood thinners (anticoagulants) and aspirin, may increase the chance of complications, such as bleeding.  You are taking or have taken alcohol or illegal drugs. Alcohol and certain other drugs may slow your recovery and can put you at risk of further injury. How fast you will recover from a concussion depends on many factors, such as how severe your concussion is, what part of your brain was injured, how old you are, and how healthy you were before the concussion. Recovery can take time. It is important to wait to return to activity until a health care provider says it is safe to do  that and your symptoms are completely gone. Follow these instructions at home: Activity   Limit activities that require a lot of thought or concentration. These may include:  Doing homework or job-related work.  Watching TV.  Working on the computer.  Playing memory games and puzzles.  Rest. Rest helps the brain to heal. Make sure you:  Get plenty of sleep at night. Avoid staying up late at  night.  Keep the same bedtime hours on weekends and weekdays.  Rest during the day. Take naps or rest breaks when you feel tired.  Having another concussion before the first one has healed can be dangerous. Do not do high-risk activities that could cause a second concussion, such as riding a bicycle or playing sports.  Ask your health care provider when you can return to your normal activities, such as school, work, athletics, driving, riding a bicycle, or using heavy machinery. Your ability to react may be slower after a brain injury. Never do these activities if you are dizzy. Your health care provider will likely give you a plan for gradually returning to activities. General instructions   Take over-the-counter and prescription medicines only as told by your health care provider.  Do not drink alcohol until your health care provider says you can.  If it is harder than usual to remember things, write them down.  If you are easily distracted, try to do one thing at a time. For example, do not try to watch TV while fixing dinner.  Talk with family members or close friends when making important decisions.  Watch your symptoms and tell others to do the same. Complications sometimes occur after a concussion. Older adults with a brain injury may have a higher risk of serious complications, such as a blood clot in the brain.  Tell your teachers, school nurse, school counselor, coach, athletic trainer, or work Freight forwarder about your injury, symptoms, and restrictions. Tell them about what you can or cannot do. They should watch for:  Increased problems with attention or concentration.  Increased difficulty remembering or learning new information.  Increased time needed to complete tasks or assignments.  Increased irritability or decreased ability to cope with stress.  Increased symptoms.  Keep all follow-up visits as told by your health care provider. This is important. How is this  prevented? It is very important to avoid another brain injury, especially as you recover. In rare cases, another injury can lead to permanent brain damage, brain swelling, or death. The risk of this is greatest during the first 7-10 days after a head injury. Avoid injuries by:  Wearing a seat belt when riding in a car.  Wearing a helmet when biking, skiing, skateboarding, skating, or doing similar activities.  Avoiding activities that could lead to a second concussion, such as contact or recreational sports, until your health care provider says it is okay.  Taking safety measures in your home, such as:  Removing clutter and tripping hazards from floors and stairways.  Using grab bars in bathrooms and handrails by stairs.  Placing non-slip mats on floors and in bathtubs.  Improving lighting in dim areas. Contact a health care provider if:  Your symptoms get worse.  You have new symptoms.  You continue to have symptoms for more than 2 weeks. Get help right away if:  You have severe or worsening headaches.  You have weakness or numbness in any part of your body.  Your coordination gets worse.  You vomit repeatedly.  You are  sleepier.  The pupil of one eye is larger than the other.  You have convulsions or a seizure.  Your speech is slurred.  Your fatigue, confusion, or irritability gets worse.  You cannot recognize people or places.  You have neck pain.  It is difficult to wake you up.  You have unusual behavior changes.  You lose consciousness. Summary  A concussion is a brain injury from a direct hit (blow) to the head or body.  A concussion may also be called a mild traumatic brain injury (TBI).  You may have imaging tests and neuropsychological tests to diagnose a concussion.  This condition is treated with physical and mental rest and careful observation.  Ask your health care provider when you can return to your normal activities, such as school,  work, athletics, driving, riding a bicycle, or using heavy machinery. Follow safety instructions as told by your health care provider. This information is not intended to replace advice given to you by your health care provider. Make sure you discuss any questions you have with your health care provider. Document Released: 06/22/2003 Document Revised: 03/12/2016 Document Reviewed: 03/12/2016 Elsevier Interactive Patient Education  2017 [employer].

## 2016-07-11 ENCOUNTER — Ambulatory Visit: Payer: BLUE CROSS/BLUE SHIELD | Admitting: Sports Medicine

## 2016-07-11 ENCOUNTER — Encounter: Payer: Self-pay | Admitting: Sports Medicine

## 2016-07-11 ENCOUNTER — Ambulatory Visit (INDEPENDENT_AMBULATORY_CARE_PROVIDER_SITE_OTHER): Payer: BLUE CROSS/BLUE SHIELD | Admitting: Sports Medicine

## 2016-07-11 DIAGNOSIS — F0781 Postconcussional syndrome: Secondary | ICD-10-CM

## 2016-07-11 HISTORY — DX: Postconcussional syndrome: F07.81

## 2016-07-11 NOTE — Assessment & Plan Note (Signed)
Symptoms have resolved, she has good balance, a nonfocal neurologic exam. Concussion is resolved, return as needed, no restrictions to activities.

## 2016-07-11 NOTE — Progress Notes (Signed)
   Subjective:    I'm seeing this patient as a consultation for:  Nelson Chimes PA-C  CC:  Concussion follow-up  HPI: Ms. [name] is a 46 y.o. Female with a history of migraine presents for follow-up from a concussion last Tuesday (9 days ago). Pt was putting clothes into the dryer and when she stood up she hit her head on the open door of the unit. She felt a headache and confusion for a few days, and said she was unable to correctly remember the day of the week during that time. Her symptoms have currently resolved, and denies any other associated symptoms, such as nausea, vomiting, or vision change.  Past medical history:  Migraine.  See flowsheet/record as well for more information.  Surgical history: Negative.  See flowsheet/record as well for more information.  Family history: Negative.  See flowsheet/record as well for more information.  Social history: Negative.  See flowsheet/record as well for more information.  Allergies, and medications have been entered into the medical record, reviewed, and no changes needed.   Review of Systems: No headache, visual changes, nausea, vomiting, diarrhea, constipation, dizziness, abdominal pain, skin rash, fevers, chills, night sweats, weight loss, swollen lymph nodes, body aches, joint swelling, muscle aches, chest pain, shortness of breath, mood changes, visual or auditory hallucinations.   Objective:   General: Well Developed, well nourished, and in no acute distress.  Neuro/Psych: Alert and oriented x3, extra-ocular muscles intact, able to move all 4 extremities, sensation grossly intact. CN exam normal. Regular, tandem, and one leg stance tests normal. Skin: Warm and dry, no rashes noted.  Respiratory: Not using accessory muscles, speaking in full sentences, trachea midline.  Cardiovascular: Pulses palpable, no extremity edema. Abdomen: Does not appear distended.   Impression and Recommendations:   This case required medical decision  making of moderate complexity.  [name] is a 46 y.o. Female with a past history of migraines who presents for follow-up of concussion last Tuesday (9 days ago). Neuro exam normal, pt advised that she can resume regular activities.

## 2016-07-19 ENCOUNTER — Telehealth: Payer: Self-pay

## 2016-07-19 ENCOUNTER — Other Ambulatory Visit: Payer: Self-pay

## 2016-07-19 ENCOUNTER — Ambulatory Visit: Payer: BLUE CROSS/BLUE SHIELD

## 2016-07-19 DIAGNOSIS — J4541 Moderate persistent asthma with (acute) exacerbation: Secondary | ICD-10-CM

## 2016-07-19 MED ORDER — ALBUTEROL SULFATE HFA 108 (90 BASE) MCG/ACT IN AERS: 1.0000 | INHALATION_SPRAY | RESPIRATORY_TRACT | 3 refills | Status: DC | PRN

## 2016-07-19 NOTE — Telephone Encounter (Signed)
Pt needs a refill on her ventolin inhaler, she is completely out.  Please advise.

## 2016-07-19 NOTE — Telephone Encounter (Signed)
Refill sent to pharmacy -[facility]  

## 2016-07-19 NOTE — Telephone Encounter (Signed)
Notified patient.

## 2016-07-19 NOTE — Telephone Encounter (Signed)
Okay to refill? 

## 2016-07-24 ENCOUNTER — Ambulatory Visit (INDEPENDENT_AMBULATORY_CARE_PROVIDER_SITE_OTHER): Payer: BLUE CROSS/BLUE SHIELD | Admitting: Physician Assistant

## 2016-07-24 ENCOUNTER — Encounter: Payer: Self-pay | Admitting: Physician Assistant

## 2016-07-24 VITALS — BP 170/73 | HR 108 | Resp 18 | Wt 188.9 lb

## 2016-07-24 DIAGNOSIS — F430 Acute stress reaction: Secondary | ICD-10-CM

## 2016-07-24 DIAGNOSIS — T7421XS Adult sexual abuse, confirmed, sequela: Secondary | ICD-10-CM | POA: Diagnosis not present

## 2016-07-24 DIAGNOSIS — N760 Acute vaginitis: Secondary | ICD-10-CM | POA: Diagnosis not present

## 2016-07-24 MED ORDER — LEVOCETIRIZINE DIHYDROCHLORIDE 5 MG PO TABS: 5.0000 mg | ORAL_TABLET | Freq: Every evening | ORAL | 5 refills | Status: DC

## 2016-07-24 MED ORDER — TRAZODONE HCL 50 MG PO TABS: 50.0000 mg | ORAL_TABLET | Freq: Every evening | ORAL | 3 refills | Status: DC | PRN

## 2016-07-24 MED ORDER — ALPRAZOLAM 1 MG PO TABS: 1.0000 mg | ORAL_TABLET | Freq: Two times a day (BID) | ORAL | 0 refills | Status: DC | PRN

## 2016-07-24 MED ORDER — FLUCONAZOLE 150 MG PO TABS: 150.0000 mg | ORAL_TABLET | Freq: Once | ORAL | 0 refills | Status: AC

## 2016-07-24 NOTE — Patient Instructions (Addendum)
-   [facility] Behavior Health Hotline [phone] - [facility] [address], [place] *open 24/7 to speak to someone and be evaluated   - Trazodone 1/2 - full tablet as needed for sleep  - Xanax 1mg  as needed for severe anxiety - Follow-up in 2 weeks or sooner as needed   - Prazosin for nightmares  If you ever feel like you may hurt yourself or others, or have thoughts about taking your own life, get help right away. You can go to your nearest emergency department or call:  Your local emergency services (911 in the [place]  A suicide crisis helpline, such as the [place] at [phone]. This is open 24-hours a day.
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
## 2016-08-07 ENCOUNTER — Ambulatory Visit (INDEPENDENT_AMBULATORY_CARE_PROVIDER_SITE_OTHER): Payer: BLUE CROSS/BLUE SHIELD | Admitting: Physician Assistant

## 2016-08-07 VITALS — BP 137/85 | HR 85 | Temp 97.7°F | Wt 185.0 lb

## 2016-08-07 DIAGNOSIS — F4310 Post-traumatic stress disorder, unspecified: Secondary | ICD-10-CM | POA: Diagnosis not present

## 2016-08-07 DIAGNOSIS — T7421XS Adult sexual abuse, confirmed, sequela: Secondary | ICD-10-CM

## 2016-08-07 DIAGNOSIS — J069 Acute upper respiratory infection, unspecified: Secondary | ICD-10-CM

## 2016-08-07 DIAGNOSIS — J302 Other seasonal allergic rhinitis: Secondary | ICD-10-CM

## 2016-08-07 MED ORDER — BENZONATATE 100 MG PO CAPS: 100.0000 mg | ORAL_CAPSULE | Freq: Three times a day (TID) | ORAL | 0 refills | Status: AC | PRN

## 2016-08-07 MED ORDER — MONTELUKAST SODIUM 10 MG PO TABS: 10.0000 mg | ORAL_TABLET | Freq: Every day | ORAL | 3 refills | Status: DC

## 2016-08-07 MED ORDER — BENZONATATE 100 MG PO CAPS: 100.0000 mg | ORAL_CAPSULE | Freq: Three times a day (TID) | ORAL | 0 refills | Status: DC | PRN

## 2016-08-07 NOTE — Patient Instructions (Addendum)
-   Singulair nightly for allergies - You can get Xyzal over-the-counter or call around to other pharmacies and we can re-send the prescription. Generic Zyrtec (Cetirizine) is another option OTC

## 2016-08-09 ENCOUNTER — Encounter: Payer: Self-pay | Admitting: Physician Assistant
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
## 2016-08-16 ENCOUNTER — Encounter: Payer: BLUE CROSS/BLUE SHIELD | Admitting: Gastroenterology

## 2016-08-16 ENCOUNTER — Ambulatory Visit: Payer: BLUE CROSS/BLUE SHIELD

## 2016-08-20 ENCOUNTER — Ambulatory Visit (INDEPENDENT_AMBULATORY_CARE_PROVIDER_SITE_OTHER): Payer: BLUE CROSS/BLUE SHIELD | Admitting: Physician Assistant

## 2016-08-20 VITALS — BP 134/93 | HR 88 | Wt 184.0 lb

## 2016-08-20 DIAGNOSIS — F331 Major depressive disorder, recurrent, moderate: Secondary | ICD-10-CM | POA: Insufficient documentation

## 2016-08-20 DIAGNOSIS — Z720 Tobacco use: Secondary | ICD-10-CM | POA: Diagnosis not present

## 2016-08-20 DIAGNOSIS — F4310 Post-traumatic stress disorder, unspecified: Secondary | ICD-10-CM | POA: Diagnosis not present

## 2016-08-20 MED ORDER — ALPRAZOLAM 1 MG PO TABS: 1.0000 mg | ORAL_TABLET | Freq: Two times a day (BID) | ORAL | 0 refills | Status: DC | PRN

## 2016-08-20 MED ORDER — TRAZODONE HCL 100 MG PO TABS: 100.0000 mg | ORAL_TABLET | Freq: Every day | ORAL | 5 refills | Status: DC

## 2016-08-20 MED ORDER — LAMOTRIGINE 25 MG PO TABS: 25.0000 mg | ORAL_TABLET | Freq: Every day | ORAL | 2 refills | Status: DC
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
## 2016-08-20 NOTE — Patient Instructions (Addendum)
- Lamotrigine 25mg  nightly for 2 weeks, then 50mg  nightly  - Follow-up with me or with psychiatry in 4 weeks - Vaping for smoking cessation - Follow-up about group therapy/in-person survivors group   Steps to Quit Smoking Smoking tobacco can be harmful to your health and can affect almost every organ in your body. Smoking puts you, and those around you, at risk for developing many serious chronic diseases. Quitting smoking is difficult, but it is one of the best things that you can do for your health. It is never too late to quit. What are the benefits of quitting smoking? When you quit smoking, you lower your risk of developing serious diseases and conditions, such as:  Lung cancer or lung disease, such as COPD.  Heart disease.  Stroke.  Heart attack.  Infertility.  Osteoporosis and bone fractures. Additionally, symptoms such as coughing, wheezing, and shortness of breath may get better when you quit. You may also find that you get sick less often because your body is stronger at fighting off colds and infections. If you are pregnant, quitting smoking can help to reduce your chances of having a baby of low birth weight. How do I get ready to quit? When you decide to quit smoking, create a plan to make sure that you are successful. Before you quit:  Pick a date to quit. Set a date within the next two weeks to give you time to prepare.  Write down the reasons why you are quitting. Keep this list in places where you will see it often, such as on your bathroom mirror or in your car or wallet.  Identify the people, places, things, and activities that make you want to smoke (triggers) and avoid them. Make sure to take these actions:  Throw away all cigarettes at home, at work, and in your car.  Throw away smoking accessories, such as Scientist, research (medical).  Clean your car and make sure to empty the ashtray.  Clean your home, including curtains and carpets.  Tell your family,  friends, and coworkers that you are quitting. Support from your loved ones can make quitting easier.  Talk with your health care provider about your options for quitting smoking.  Find out what treatment options are covered by your health insurance. What strategies can I use to quit smoking? Talk with your healthcare provider about different strategies to quit smoking. Some strategies include:  Quitting smoking altogether instead of gradually lessening how much you smoke over a period of time. Research shows that quitting "cold [place]" is more successful than gradually quitting.  Attending in-person counseling to help you build problem-solving skills. You are more likely to have success in quitting if you attend several counseling sessions. Even short sessions of 10 minutes can be effective.  Finding resources and support systems that can help you to quit smoking and remain smoke-free after you quit. These resources are most helpful when you use them often. They can include:  Online chats with a Social worker.  Telephone quitlines.  Printed Furniture conservator/restorer.  Support groups or group counseling.  Text messaging programs.  Mobile phone applications.  Taking medicines to help you quit smoking. (If you are pregnant or breastfeeding, talk with your health care provider first.) Some medicines contain nicotine and some do not. Both types of medicines help with cravings, but the medicines that include nicotine help to relieve withdrawal symptoms. Your health care provider may recommend:  Nicotine patches, gum, or lozenges.  Nicotine inhalers or sprays.  Non-nicotine medicine that is taken by mouth. Talk with your health care provider about combining strategies, such as taking medicines while you are also receiving in-person counseling. Using these two strategies together makes you more likely to succeed in quitting than if you used either strategy on its own. If you are pregnant or  breastfeeding, talk with your health care provider about finding counseling or other support strategies to quit smoking. Do not take medicine to help you quit smoking unless told to do so by your health care provider. What things can I do to make it easier to quit? Quitting smoking might feel overwhelming at first, but there is a lot that you can do to make it easier. Take these important actions:  Reach out to your family and friends and ask that they support and encourage you during this time. Call telephone quitlines, reach out to support groups, or work with a counselor for support.  Ask people who smoke to avoid smoking around you.  Avoid places that trigger you to smoke, such as bars, parties, or smoke-break areas at work.  Spend time around people who do not smoke.  Lessen stress in your life, because stress can be a smoking trigger for some people. To lessen stress, try:  Exercising regularly.  Deep-breathing exercises.  Yoga.  Meditating.  Performing a body scan. This involves closing your eyes, scanning your body from head to toe, and noticing which parts of your body are particularly tense. Purposefully relax the muscles in those areas.  Download or purchase mobile phone or tablet apps (applications) that can help you stick to your quit plan by providing reminders, tips, and encouragement. There are many free apps, such as QuitGuide from the [employer] Office manager for Disease Control and Prevention). You can find other support for quitting smoking (smoking cessation) through smokefree.gov and other websites. How will I feel when I quit smoking? Within the first 24 hours of quitting smoking, you may start to feel some withdrawal symptoms. These symptoms are usually most noticeable 2-3 days after quitting, but they usually do not last beyond 2-3 weeks. Changes or symptoms that you might experience include:  Mood swings.  Restlessness, anxiety, or irritation.  Difficulty  concentrating.  Dizziness.  Strong cravings for sugary foods in addition to nicotine.  Mild weight gain.  Constipation.  Nausea.  Coughing or a sore throat.  Changes in how your medicines work in your body.  A depressed mood.  Difficulty sleeping (insomnia). After the first 2-3 weeks of quitting, you may start to notice more positive results, such as:  Improved sense of smell and taste.  Decreased coughing and sore throat.  Slower heart rate.  Lower blood pressure.  Clearer skin.  The ability to breathe more easily.  Fewer sick days. Quitting smoking is very challenging for most people. Do not get discouraged if you are not successful the first time. Some people need to make many attempts to quit before they achieve long-term success. Do your best to stick to your quit plan, and talk with your health care provider if you have any questions or concerns. This information is not intended to replace advice given to you by your health care provider. Make sure you discuss any questions you have with your health care provider. Document Released: 03/26/2001 Document Revised: 11/28/2015 Document Reviewed: 08/16/2014 Elsevier Interactive Patient Education  2017 [place] Stress Disorder Posttraumatic stress disorder (PTSD) is a mental health disorder that can occur after a traumatic event,  such as a threat to life, serious injury, or sexual violence. Some people who experience these types of events may develop PTSD. Sometimes, PTSD can occur in people who hear about trauma that occurs to a close family member or friend. PTSD can happen to anyone at any age. What increases the risk? This condition is more likely to occur in:  Engineer, manufacturing.  People who have been the victims of, or witness to, a traumatic event, such as:  Domestic violence.  Childhood physical or sexual abuse.  Rape.  Natural disasters.  Accidents involving  serious injury. What are the signs or symptoms? Symptoms of PTSD can be grouped into several categories: intrusive, avoidance, increased arousal, and negative moods and thoughts. Intrusive Symptoms  This is when a person re-experiences the traumatic event through one or more of the following ways:  Distressing dreams.  Feelings of fear, horror, intense sadness, or anger in response to a reminder of the trauma.  Unwanted distressing memories while awake.  Physical reactions triggered by reminders of the trauma, such as increased heart rate, shortness of breath, sweating, and shaking.  Having flashbacks, or feelings like you are going through the event again. Avoidance Symptoms  This is when a person avoids thoughts, conversations, people, or activities that are reminders of the trauma. Symptoms may also include:  Decreased interest or participation in daily activities.  Loss of connection or avoidance of other people. Increased Arousal Symptoms  This is when a person is more sensitive or reacts more easily to their environment. Symptoms may include:  Being easily startled.  Careless or self-destructive behavior.  Irritability.  Feeling on edge.  Difficulty concentrating.  Verbal or physical outbursts of anger toward other people or objects.  Difficulty sleeping. Negative Moods or Thoughts  These may include:  Belief that oneself or others are bad.  Regular feelings of fear, horror, anger, sadness, guilt, or shame.  Not being able to remember certain parts of the traumatic event.  Blaming themselves or others for the trauma.  Inability to experience positive emotions, such as happiness or love. PTSD symptoms may start soon after a frightening event or months or years later. Symptoms last at least one month and tend to disrupt relationships, work, and daily activities. How is this diagnosed? PTSD is diagnosed through an assessment by a mental health professional. Dennis Bast  will be asked questions about any traumatic events. You will also be asked about how these events have changed your thoughts, mood, behavior, and ability to function on a daily basis. You may also be asked if you use alcohol or drugs. How is this treated? Treatment for PTSD may include:  Medicines. Certain medicines can reduce some PTSD symptoms.  Counseling (cognitive behavioral therapy). Talk therapy with a mental health professional who is experienced in treating PTSD can help.  Eye movement desensitization and reprocessing therapy (EMDR). This type of therapy occurs with a specialized therapist. Many people with PTSD benefit from a combination of these treatments. If you have other mental health problems, such as depression, alcohol abuse, or drug addiction, your treatment plan will include treatment for these other conditions. Follow these instructions at home: Lifestyle   Find a support group in your community. Often groups are available for [employer] veterans, trauma victims, and family members or caregivers.  Find ways to relax. This may include:  Breathing exercises.  Meditation.  Yoga.  Listening to quiet music.  Exercise regularly. Try to do at least 30 minutes of physical activity  most days of the week.  Try to get 7-9 hours of sleep each night. To help with sleep:  Keep your bedroom cool and dark.  Do not eat a heavy meal within one hour of bedtime.  Do not drink alcohol or caffeinated drinks before bed.  Avoid screen time, such as television, computers, tablets, or cell phones before bed.  Do not drink alcohol or take illegal drugs.  Look into volunteer opportunities. This can help you feel more connected to your community.  Take steps to help yourself feel safer at home, such as by installing a security system.  Contact a local organization to find out if you are eligible for a service dog.  Keep daily contact with at least one trusted friend or family  member.  If your PTSD is affecting your marriage or family, seek help from a family therapist. General instructions   Take over-the-counter and prescription medicines only as told by your health care provider.  Keep all follow-up visits as told by your health care provider and counselor. This is important.  Make sure to let all of your health care providers know you have PTSD. This is especially important if you are having surgery or need to be admitted to the hospital. Contact a health care provider if:  Your symptoms do not get better or get worse. Get help right away if:  You have thoughts of wanting to harm yourself or others. This information is not intended to replace advice given to you by your health care provider. Make sure you discuss any questions you have with your health care provider. Document Released: 12/25/2000 Document Revised: 07/24/2015 Document Reviewed: 03/17/2015 Elsevier Interactive Patient Education  2017 [employer].

## 2016-08-26 ENCOUNTER — Ambulatory Visit (INDEPENDENT_AMBULATORY_CARE_PROVIDER_SITE_OTHER): Payer: BLUE CROSS/BLUE SHIELD

## 2016-08-26 ENCOUNTER — Ambulatory Visit (INDEPENDENT_AMBULATORY_CARE_PROVIDER_SITE_OTHER): Payer: BLUE CROSS/BLUE SHIELD | Admitting: Physician Assistant

## 2016-08-26 ENCOUNTER — Other Ambulatory Visit: Payer: Self-pay | Admitting: Physician Assistant

## 2016-08-26 VITALS — BP 117/84 | HR 96 | Temp 98.2°F | Wt 180.0 lb

## 2016-08-26 DIAGNOSIS — R05 Cough: Secondary | ICD-10-CM

## 2016-08-26 DIAGNOSIS — R059 Cough, unspecified: Secondary | ICD-10-CM

## 2016-08-26 DIAGNOSIS — J45909 Unspecified asthma, uncomplicated: Secondary | ICD-10-CM

## 2016-08-26 MED ORDER — AZITHROMYCIN 250 MG PO TABS: ORAL_TABLET | ORAL | 0 refills | Status: DC

## 2016-08-26 MED ORDER — PREDNISONE 50 MG PO TABS: ORAL_TABLET | ORAL | 0 refills | Status: DC

## 2016-08-26 MED ORDER — GUAIFENESIN-CODEINE 100-10 MG/5ML PO SOLN: 10.0000 mL | Freq: Every evening | ORAL | 0 refills | Status: DC | PRN

## 2016-08-26 NOTE — Patient Instructions (Addendum)
-   Azithromycin and Prednisone for 5 days as prescribed - Guaifenesin with codeine 101ml at bedtime as needed. Will cause sedation. DO NOT combine with Xanax, alcohol or other sedating medications.

## 2016-08-27 ENCOUNTER — Telehealth: Payer: Self-pay

## 2016-08-27 NOTE — Telephone Encounter (Signed)
-----   Message from [facility] [place] sent at 08/27/2016 11:35 AM EDT ----- Let patient know that radiologist did not read her chest x-ray as pneumonia. They did think the airways looked coarse - this could be asthma or COPD I recommend she come in in 4-6 weeks for spirometry and to receive Pneumonia vaccine Continue antibiotic and steroid as prescribed

## 2016-08-27 NOTE — Telephone Encounter (Signed)
Pt notified of results and recommendations -EH/RMA

## 2016-08-27 NOTE — Progress Notes (Signed)
HPI:                                                                [name] is a 46 y.o. female who presents to [facility] [place]: [place] today for cough  Cough  This is a new problem. The current episode started in the past 7 days. The problem has been gradually worsening. The problem occurs every few minutes. The cough is productive of sputum. Associated symptoms include chills. Pertinent negatives include no fever, hemoptysis, myalgias, rash, sore throat, shortness of breath or wheezing. Associated symptoms comments: + malaise + fatigue. Nothing aggravates the symptoms. Risk factors for lung disease include smoking/tobacco exposure. She has tried rest, prescription cough suppressant, OTC cough suppressant, a beta-agonist inhaler and steroid inhaler for the symptoms. The treatment provided no relief. Her past medical history is significant for asthma, environmental allergies and pneumonia.  Patient states she is coughing so much she is having emesis. She is requesting strong cough suppressant.    Past Medical History:  Diagnosis Date  . Adenomyosis 2013  . Anemia   . Anxiety   . Asthma   . Cancer of appendix ([place])   . Complication of anesthesia    scoline pain? from use of Succinylcholine   . Cough   . Depression   . Endometriosis   . Generalized headaches   . GERD (gastroesophageal reflux disease)   . History of sinus surgery 2010   MAXILLARY, ETHMOID, SPHENOID  . Hypothyroidism   . Iron deficiency anemia   . Kidney stones   . Migraine   . Obesity, Class III, BMI 40-49.9 (morbid obesity) ([place])   . SVD (spontaneous vaginal delivery)    x3   Past Surgical History:  Procedure Laterality Date  . ABDOMINAL HYSTERECTOMY    . CHOLECYSTECTOMY  01/27/2012   Procedure: LAPAROSCOPIC CHOLECYSTECTOMY;  Surgeon: Harl Bowie, MD;  Location: [facility];  Service: General;  Laterality: N/A;  Laparoscopic chole  . COLPOSCOPY  2007  . KNEE  SURGERY     right, scope and ACL repair  . LAPAROSCOPIC APPENDECTOMY  01/02/2012   Procedure: APPENDECTOMY LAPAROSCOPIC;  Surgeon: Harl Bowie, MD;  Location: [facility];  Service: General;  Laterality: N/A;  . LAPAROSCOPIC ASSISTED VAGINAL HYSTERECTOMY  11/13/2011   Procedure: LAPAROSCOPIC ASSISTED VAGINAL HYSTERECTOMY;  Surgeon: Darlyn Chamber, MD;  Location: [facility];  Service: Gynecology;  Laterality: N/A;  . LAPAROSCOPY  05/30/2011   Procedure: LAPAROSCOPY OPERATIVE;  Surgeon: Darlyn Chamber, MD;  Location: [facility];  Service: Gynecology;  Laterality: N/A;  YAG  LASER of Endometriosis.  Marland Kitchen NASAL SINUS SURGERY  2010  . PARTIAL COLECTOMY  01/27/2012   Social History  Substance Use Topics  . Smoking status: Current Every Day Smoker    Packs/day: 0.50    Types: Cigarettes  . Smokeless tobacco: Never Used  . Alcohol use No   family history includes Alcohol abuse in her brother and father; Asthma in her mother and sister; Colon cancer in her paternal grandmother; Depression in her brother, mother, sister, and sister; Drug abuse in her brother; Lung cancer in her father; Suicidality in her brother.  ROS: negative except as noted in the HPI  Medications: Current Outpatient Prescriptions  Medication Sig Dispense Refill  . albuterol (VENTOLIN HFA) 108 (90 Base) MCG/ACT inhaler Inhale 1-2 puffs into the lungs every 4 (four) hours as needed for wheezing or shortness of breath. 18 g 3  . ALPRAZolam (XANAX) 1 MG tablet Take 1 tablet (1 mg total) by mouth 2 (two) times daily as needed for anxiety. 60 tablet 0  . azithromycin (ZITHROMAX Z-PAK) 250 MG tablet Take 2 tablets (500 mg) on  Day 1,  followed by 1 tablet (250 mg) once daily on Days 2 through 5. 6 tablet 0  . busPIRone (BUSPAR) 15 MG tablet Take 1 tablet (15 mg total) by mouth 3 (three) times daily. 90 tablet 11  . FLUoxetine (PROZAC) 20 MG tablet Take 2 tablets (40 mg total) by mouth daily. 180 tablet 3  . fluticasone (FLONASE) 50 MCG/ACT nasal  spray Place 2 sprays into both nostrils daily. 16 g 6  . Fluticasone-Salmeterol (ADVAIR DISKUS) 250-50 MCG/DOSE AEPB Inhale 1 puff into the lungs 2 (two) times daily. 1 each 3  . guaiFENesin-codeine 100-10 MG/5ML syrup Take 10 mLs by mouth at bedtime as needed for cough. 100 mL 0  . lamoTRIgine (LAMICTAL) 25 MG tablet Take 1 tablet (25 mg total) by mouth daily. Take 25mg  PO nightly for 2 weeks, then 50mg  PO nightly for 2 weeks 90 tablet 2  . levocetirizine (XYZAL) 5 MG tablet Take 1 tablet (5 mg total) by mouth every evening. 30 tablet 5  . levothyroxine (SYNTHROID, LEVOTHROID) 75 MCG tablet Take 1 tablet (75 mcg total) by mouth daily. 30 tablet 11  . omeprazole (PRILOSEC) 20 MG capsule Take 20 mg by mouth daily.    . predniSONE (DELTASONE) 50 MG tablet One tab PO daily for 5 days. 5 tablet 0  . promethazine (PHENERGAN) 25 MG tablet Take 1 tablet (25 mg total) by mouth every 6 (six) hours as needed for nausea or vomiting. 30 tablet 0  . ranitidine (ZANTAC) 150 MG tablet Take 150 mg by mouth daily.    . traZODone (DESYREL) 100 MG tablet Take 1 tablet (100 mg total) by mouth at bedtime. 30 tablet 5   No current facility-administered medications for this visit.    Allergies  Allergen Reactions  . Reglan [Metoclopramide] Other (See Comments)    Caused a dystonic reaction.  . Ciprofloxacin Nausea And Vomiting  . Clarithromycin Hives  . Moxifloxacin Hives       Objective:  BP 117/84   Pulse 96   Temp 98.2 F (36.8 C) (Oral)   Wt 180 lb (81.6 kg)   LMP 11/02/2011   BMI 30.90 kg/m  Gen: well-groomed, cooperative, not ill-appearing, no distress Pulm: Normal work of breathing, normal phonation, clear to auscultation bilaterally, no wheezes, rales or rhonchi CV: Normal rate, regular rhythm, s1 and s2 distinct, no murmurs, clicks or rubs  Neuro: alert and oriented x 3, EOM's intact MSK: moving all extremities, normal gait and station, no peripheral edema Psych: good eye contact, normal  affect, euthymic mood, normal speech and thought content    No results found for this or any previous visit (from the past 72 hour(s)). Dg Chest 2 View  Result Date: 08/26/2016 CLINICAL DATA:  Cough, chest congestion, and fever for the past 3 weeks. History of asthma, current smoker. EXAM: CHEST  2 VIEW COMPARISON:  Chest x-ray of March 03, 2012 FINDINGS: The lungs are adequately inflated. Interstitial markings are coarse but not greatly changed allowing for differences in radiographic technique. The heart and pulmonary vascularity are  normal. The mediastinum is normal in width. There is no pleural effusion. There is thoracolumbar curvature convex toward the left which is stable. IMPRESSION: Mild interstitial prominence likely reflects the patient's known reactive airway disease. There is no pneumonia, CHF, nor other acute cardiopulmonary abnormality. Electronically Signed   By: David  Martinique M.D.   On: 08/26/2016 14:46      Assessment and Plan: 46 y.o. female with   1. Cough - will treat as COPD exacerbation given patient's smoking history. Recommend spirometry in 4-6 weeks - patient states she is not allergic to Z-pak - patient instructed cough medicine with codeine not to be combined with xanax, alcohol or any other sedating medications - guaiFENesin-codeine 100-10 MG/5ML syrup; Take 10 mLs by mouth at bedtime as needed for cough.  Dispense: 100 mL; Refill: 0 - predniSONE (DELTASONE) 50 MG tablet; One tab PO daily for 5 days.  Dispense: 5 tablet; Refill: 0 - azithromycin (ZITHROMAX Z-PAK) 250 MG tablet; Take 2 tablets (500 mg) on  Day 1,  followed by 1 tablet (250 mg) once daily on Days 2 through 5.  Dispense: 6 tablet; Refill: 0  Patient education and anticipatory guidance given Patient agrees with treatment plan Follow-up spirometry in 4-6 weeks or sooner as needed if symptoms worsen or fail to improve  Darlyne Russian PA-C

## 2016-09-04 ENCOUNTER — Ambulatory Visit: Payer: BLUE CROSS/BLUE SHIELD | Admitting: Physician Assistant

## 2016-09-17 ENCOUNTER — Ambulatory Visit (HOSPITAL_COMMUNITY): Payer: BLUE CROSS/BLUE SHIELD | Admitting: Psychiatry

## 2016-09-25 ENCOUNTER — Ambulatory Visit (INDEPENDENT_AMBULATORY_CARE_PROVIDER_SITE_OTHER): Payer: BLUE CROSS/BLUE SHIELD | Admitting: Physician Assistant

## 2016-09-25 VITALS — BP 125/82 | HR 84 | Temp 98.4°F | Wt 181.0 lb

## 2016-09-25 DIAGNOSIS — H6981 Other specified disorders of Eustachian tube, right ear: Secondary | ICD-10-CM

## 2016-09-25 DIAGNOSIS — H6991 Unspecified Eustachian tube disorder, right ear: Secondary | ICD-10-CM | POA: Insufficient documentation

## 2016-09-25 DIAGNOSIS — F4310 Post-traumatic stress disorder, unspecified: Secondary | ICD-10-CM

## 2016-09-25 LAB — CBC
HCT: 42.6 % (ref 35.0–45.0)
Hemoglobin: 14.4 g/dL (ref 11.7–15.5)
MCH: 32.3 pg (ref 27.0–33.0)
MCHC: 33.8 g/dL (ref 32.0–36.0)
MCV: 95.5 fL (ref 80.0–100.0)
MPV: 10.6 fL (ref 7.5–12.5)
PLATELETS: 346 10*3/uL (ref 140–400)
RBC: 4.46 MIL/uL (ref 3.80–5.10)
RDW: 13.4 % (ref 11.0–15.0)
WBC: 10.6 10*3/uL (ref 3.8–10.8)

## 2016-09-25 MED ORDER — ALPRAZOLAM 1 MG PO TABS: 1.0000 mg | ORAL_TABLET | Freq: Two times a day (BID) | ORAL | 2 refills | Status: DC | PRN

## 2016-09-25 MED ORDER — PREDNISONE 50 MG PO TABS: ORAL_TABLET | ORAL | 0 refills | Status: DC

## 2016-09-25 NOTE — Patient Instructions (Signed)
- Prednisone, 1 tab daily for 5 days - Continue oral decongestant - Limit Afrin to 3 days - Continue intranasal steroids/Flonase 2 sprays each nostril daily - I have placed a referral for you to see ENT - Ibuprofen 600mg  and Tylenol 1000mg  every 8 hours as needed for pain   Eustachian Tube Dysfunction The eustachian tube connects the middle ear to the back of the nose. It regulates air pressure in the middle ear by allowing air to move between the ear and nose. It also helps to drain fluid from the middle ear space. When the eustachian tube does not function properly, air pressure, fluid, or both can build up in the middle ear. Eustachian tube dysfunction can affect one or both ears. What are the causes? This condition happens when the eustachian tube becomes blocked or cannot open normally. This may result from:  Ear infections.  Colds and other upper respiratory infections.  Allergies.  Irritation, such as from cigarette smoke or acid from the stomach coming up into the esophagus (gastroesophageal reflux).  Sudden changes in air pressure, such as from descending in an airplane.  Abnormal growths in the nose or throat, such as nasal polyps, tumors, or enlarged tissue at the back of the throat (adenoids).  What increases the risk? This condition may be more likely to develop in people who smoke and people who are overweight. Eustachian tube dysfunction may also be more likely to develop in children, especially children who have:  Certain birth defects of the mouth, such as cleft palate.  Large tonsils and adenoids.  What are the signs or symptoms? Symptoms of this condition may include:  A feeling of fullness in the ear.  Ear pain.  Clicking or popping noises in the ear.  Ringing in the ear.  Hearing loss.  Loss of balance.  Symptoms may get worse when the air pressure around you changes, such as when you travel to an area of high elevation or fly on an airplane. How  is this diagnosed? This condition may be diagnosed based on:  Your symptoms.  A physical exam of your ear, nose, and throat.  Tests, such as those that measure: ? The movement of your eardrum (tympanogram). ? Your hearing (audiometry).  How is this treated? Treatment depends on the cause and severity of your condition. If your symptoms are mild, you may be able to relieve your symptoms by moving air into ("popping") your ears. If you have symptoms of fluid in your ears, treatment may include:  Decongestants.  Antihistamines.  Nasal sprays or ear drops that contain medicines that reduce swelling (steroids).  In some cases, you may need to have a procedure to drain the fluid in your eardrum (myringotomy). In this procedure, a small tube is placed in the eardrum to:  Drain the fluid.  Restore the air in the middle ear space.  Follow these instructions at home:  Take over-the-counter and prescription medicines only as told by your health care provider.  Use techniques to help pop your ears as recommended by your health care provider. These may include: ? Chewing gum. ? Yawning. ? Frequent, forceful swallowing. ? Closing your mouth, holding your nose closed, and gently blowing as if you are trying to blow air out of your nose.  Do not do any of the following until your health care provider approves: ? Travel to high altitudes. ? Fly in airplanes. ? Work in a Pension scheme manager or room. ? Scuba dive.  Keep your ears  dry. Dry your ears completely after showering or bathing.  Do not smoke.  Keep all follow-up visits as told by your health care provider. This is important. Contact a health care provider if:  Your symptoms do not go away after treatment.  Your symptoms come back after treatment.  You are unable to pop your ears.  You have: ? A fever. ? Pain in your ear. ? Pain in your head or neck. ? Fluid draining from your ear.  Your hearing suddenly  changes.  You become very dizzy.  You lose your balance. This information is not intended to replace advice given to you by your health care provider. Make sure you discuss any questions you have with your health care provider. Document Released: 04/28/2015 Document Revised: 09/07/2015 Document Reviewed: 04/20/2014 Elsevier Interactive Patient Education  [employer].

## 2016-09-25 NOTE — Progress Notes (Signed)
HPI:                                                                [name] is a 46 y.o. female who presents to [facility]: [place] today for right ear pain  Otalgia   There is pain in the right ear. This is a new problem. The current episode started in the past 7 days (x 4 days). The problem occurs constantly. The problem has been gradually worsening. There has been no fever. The pain is moderate. Associated symptoms include rhinorrhea. Pertinent negatives include no coughing, ear discharge or sore throat. Treatments tried: Zyrtec, Afrin, Mucinex, Sudafed.    Past Medical History:  Diagnosis Date  . Adenomyosis 2013  . Anemia   . Anxiety   . Asthma   . Cancer of appendix ([place])   . Complication of anesthesia    scoline pain? from use of Succinylcholine   . Cough   . Depression   . Endometriosis   . Generalized headaches   . GERD (gastroesophageal reflux disease)   . History of sinus surgery 2010   MAXILLARY, ETHMOID, SPHENOID  . Hypothyroidism   . Iron deficiency anemia   . Kidney stones   . Migraine   . Obesity, Class III, BMI 40-49.9 (morbid obesity) ([place])   . SVD (spontaneous vaginal delivery)    x3   Past Surgical History:  Procedure Laterality Date  . ABDOMINAL HYSTERECTOMY    . CHOLECYSTECTOMY  01/27/2012   Procedure: LAPAROSCOPIC CHOLECYSTECTOMY;  Surgeon: Harl Bowie, MD;  Location: [facility];  Service: General;  Laterality: N/A;  Laparoscopic chole  . COLPOSCOPY  2007  . KNEE SURGERY     right, scope and ACL repair  . LAPAROSCOPIC APPENDECTOMY  01/02/2012   Procedure: APPENDECTOMY LAPAROSCOPIC;  Surgeon: Harl Bowie, MD;  Location: [facility];  Service: General;  Laterality: N/A;  . LAPAROSCOPIC ASSISTED VAGINAL HYSTERECTOMY  11/13/2011   Procedure: LAPAROSCOPIC ASSISTED VAGINAL HYSTERECTOMY;  Surgeon: Darlyn Chamber, MD;  Location: [facility];  Service: Gynecology;  Laterality: N/A;  . LAPAROSCOPY  05/30/2011    Procedure: LAPAROSCOPY OPERATIVE;  Surgeon: Darlyn Chamber, MD;  Location: [facility];  Service: Gynecology;  Laterality: N/A;  YAG  LASER of Endometriosis.  Marland Kitchen NASAL SINUS SURGERY  2010  . PARTIAL COLECTOMY  01/27/2012   Social History  Substance Use Topics  . Smoking status: Current Every Day Smoker    Packs/day: 0.50    Types: Cigarettes  . Smokeless tobacco: Never Used  . Alcohol use No   family history includes Alcohol abuse in her brother and father; Asthma in her mother and sister; Colon cancer in her paternal grandmother; Depression in her brother, mother, sister, and sister; Drug abuse in her brother; Lung cancer in her father; Suicidality in her brother.  ROS: negative except as noted in the HPI  Medications: Current Outpatient Prescriptions  Medication Sig Dispense Refill  . albuterol (VENTOLIN HFA) 108 (90 Base) MCG/ACT inhaler Inhale 1-2 puffs into the lungs every 4 (four) hours as needed for wheezing or shortness of breath. 18 g 3  . ALPRAZolam (XANAX) 1 MG tablet Take 1 tablet (1 mg total) by mouth 2 (two) times daily as needed for anxiety.  60 tablet 0  . busPIRone (BUSPAR) 15 MG tablet Take 1 tablet (15 mg total) by mouth 3 (three) times daily. 90 tablet 11  . FLUoxetine (PROZAC) 20 MG tablet Take 2 tablets (40 mg total) by mouth daily. 180 tablet 3  . fluticasone (FLONASE) 50 MCG/ACT nasal spray Place 2 sprays into both nostrils daily. 16 g 6  . Fluticasone-Salmeterol (ADVAIR DISKUS) 250-50 MCG/DOSE AEPB Inhale 1 puff into the lungs 2 (two) times daily. 1 each 3  . lamoTRIgine (LAMICTAL) 25 MG tablet Take 1 tablet (25 mg total) by mouth daily. Take 25mg  PO nightly for 2 weeks, then 50mg  PO nightly for 2 weeks 90 tablet 2  . levocetirizine (XYZAL) 5 MG tablet Take 1 tablet (5 mg total) by mouth every evening. 30 tablet 5  . levothyroxine (SYNTHROID, LEVOTHROID) 75 MCG tablet Take 1 tablet (75 mcg total) by mouth daily. 30 tablet 11  . omeprazole (PRILOSEC) 20 MG capsule Take  20 mg by mouth daily.    . promethazine (PHENERGAN) 25 MG tablet Take 1 tablet (25 mg total) by mouth every 6 (six) hours as needed for nausea or vomiting. 30 tablet 0  . ranitidine (ZANTAC) 150 MG tablet Take 150 mg by mouth daily.    . traZODone (DESYREL) 100 MG tablet Take 1 tablet (100 mg total) by mouth at bedtime. 30 tablet 5   No current facility-administered medications for this visit.    Allergies  Allergen Reactions  . Reglan [Metoclopramide] Other (See Comments)    Caused a dystonic reaction.  . Ciprofloxacin Nausea And Vomiting  . Clarithromycin Hives  . Moxifloxacin Hives       Objective:  BP 125/82   Pulse 84   Temp 98.4 F (36.9 C) (Oral)   Wt 181 lb (82.1 kg)   LMP 11/02/2011   BMI 31.07 kg/m  Gen: well-groomed, cooperative, not ill-appearing, no distress HEENT: normal conjunctiva, normal external ear and canal, TM's clear, no auricle, tragus or mastoid tenderness,  Pulm: Normal work of breathing, normal phonation, clear to auscultation bilaterally, no wheezes, rales or rhonchi CV: Normal rate, regular rhythm, s1 and s2 distinct, no murmurs, clicks or rubs  Neuro: alert and oriented x 3, EOM's intact, no tremor MSK: moving all extremities, normal gait and station, no peripheral edema Lymph: no cervical or tonsillar adenopathy Skin: warm, dry, intact; no rashes or lesions on exposed skin, no cyanosis   No results found for this or any previous visit (from the past 72 hour(s)). No results found.    Assessment and Plan: 46 y.o. female with   1. Dysfunction of right eustachian tube - normal tympanogram, but patient is very symptomatic. Patient has had recurrent ear infections. Will refer to ENT for possible tympanostomy - predniSONE (DELTASONE) 50 MG tablet; One tab PO daily for 5 days.  Dispense: 5 tablet; Refill: 0 - Continue oral decongestant - Limit Afrin to 3 days - Continue intranasal steroids/Flonase 2 sprays each nostril daily - Ibuprofen 600mg   and Tylenol 1000mg  every 8 hours as needed for pain - Ambulatory referral to ENT  2. 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  Dispense: 30 tablet; Refill: 2   Patient education and anticipatory guidance given Patient agrees with treatment plan Follow-up as needed if symptoms worsen or fail to improve  Darlyne Russian PA-C

## 2016-09-26 ENCOUNTER — Encounter: Payer: Self-pay | Admitting: Physician Assistant

## 2016-09-26 DIAGNOSIS — E7800 Pure hypercholesterolemia, unspecified: Secondary | ICD-10-CM | POA: Insufficient documentation

## 2016-09-26 DIAGNOSIS — E785 Hyperlipidemia, unspecified: Secondary | ICD-10-CM | POA: Insufficient documentation

## 2016-09-26 LAB — COMPREHENSIVE METABOLIC PANEL
ALBUMIN: 4.6 g/dL (ref 3.6–5.1)
ALT: 17 U/L (ref 6–29)
AST: 16 U/L (ref 10–35)
Alkaline Phosphatase: 70 U/L (ref 33–115)
BILIRUBIN TOTAL: 0.5 mg/dL (ref 0.2–1.2)
BUN: 14 mg/dL (ref 7–25)
CALCIUM: 9.6 mg/dL (ref 8.6–10.2)
CHLORIDE: 101 mmol/L (ref 98–110)
CO2: 29 mmol/L (ref 20–31)
CREATININE: 0.71 mg/dL (ref 0.50–1.10)
GLUCOSE: 98 mg/dL (ref 65–99)
Potassium: 3.8 mmol/L (ref 3.5–5.3)
SODIUM: 139 mmol/L (ref 135–146)
Total Protein: 7.9 g/dL (ref 6.1–8.1)

## 2016-09-26 LAB — LIPID PANEL W/REFLEX DIRECT LDL
CHOL/HDL RATIO: 3.1 ratio (ref <5.0)
CHOLESTEROL: 241 mg/dL — AB (ref <200)
HDL: 79 mg/dL (ref >50)
LDL-Cholesterol: 142 mg/dL — ABNORMAL HIGH
Non-HDL Cholesterol (Calc): 162 mg/dL — ABNORMAL HIGH (ref <130)
Triglycerides: 94 mg/dL (ref <150)

## 2016-09-26 LAB — HEMOGLOBIN A1C
Hgb A1c MFr Bld: 5.5 % (ref <5.7)
MEAN PLASMA GLUCOSE: 111 mg/dL

## 2016-09-26 LAB — T4, FREE: Free T4: 0.8 ng/dL (ref 0.8–1.8)

## 2016-09-26 LAB — TSH: TSH: 7.11 m[IU]/L — AB

## 2016-09-26 MED ORDER — LEVOTHYROXINE SODIUM 88 MCG PO TABS: 88.0000 ug | ORAL_TABLET | Freq: Every day | ORAL | 3 refills | Status: DC

## 2016-09-26 NOTE — Progress Notes (Signed)
Hi [place],  Your TSH is elevated so we are going to adjust your Levothyroxine to 1mcg daily and recheck your labs in 6 weeks. I sent a new Rx to your [facility] pharmacy.  Your total cholesterol is high and your LDL is borderline high. This does not require a statin. Recommendations are to continue working on smoking cessation, regular cardiovascular exercise, limiting saturated fat/processed food, and increasing intake of whole grains/fruits/veggies.  Your other labs look good!  Best, Evlyn Clines

## 2016-09-26 NOTE — Addendum Note (Signed)
Addended by: Nelson Chimes E on: 09/26/2016 08:18 AM   Modules accepted: Orders

## 2016-10-04 ENCOUNTER — Ambulatory Visit: Payer: BLUE CROSS/BLUE SHIELD | Admitting: Osteopathic Medicine

## 2016-10-28 ENCOUNTER — Ambulatory Visit (HOSPITAL_COMMUNITY): Payer: BLUE CROSS/BLUE SHIELD | Admitting: Psychiatry

## 2016-12-09 ENCOUNTER — Ambulatory Visit: Payer: BLUE CROSS/BLUE SHIELD | Admitting: Physician Assistant

## 2016-12-09 DIAGNOSIS — Z0189 Encounter for other specified special examinations: Secondary | ICD-10-CM

## 2017-01-31 ENCOUNTER — Ambulatory Visit (INDEPENDENT_AMBULATORY_CARE_PROVIDER_SITE_OTHER): Payer: BLUE CROSS/BLUE SHIELD | Admitting: Physician Assistant

## 2017-01-31 ENCOUNTER — Encounter: Payer: Self-pay | Admitting: Physician Assistant

## 2017-01-31 VITALS — BP 129/85 | HR 85 | Wt 183.0 lb

## 2017-01-31 DIAGNOSIS — R7989 Other specified abnormal findings of blood chemistry: Secondary | ICD-10-CM | POA: Diagnosis not present

## 2017-01-31 DIAGNOSIS — Z8639 Personal history of other endocrine, nutritional and metabolic disease: Secondary | ICD-10-CM

## 2017-01-31 DIAGNOSIS — Z862 Personal history of diseases of the blood and blood-forming organs and certain disorders involving the immune mechanism: Secondary | ICD-10-CM

## 2017-01-31 DIAGNOSIS — M797 Fibromyalgia: Secondary | ICD-10-CM

## 2017-01-31 DIAGNOSIS — Z1321 Encounter for screening for nutritional disorder: Secondary | ICD-10-CM

## 2017-01-31 DIAGNOSIS — E538 Deficiency of other specified B group vitamins: Secondary | ICD-10-CM

## 2017-01-31 DIAGNOSIS — E559 Vitamin D deficiency, unspecified: Secondary | ICD-10-CM

## 2017-01-31 DIAGNOSIS — J45901 Unspecified asthma with (acute) exacerbation: Secondary | ICD-10-CM

## 2017-01-31 DIAGNOSIS — E039 Hypothyroidism, unspecified: Secondary | ICD-10-CM

## 2017-01-31 DIAGNOSIS — F4310 Post-traumatic stress disorder, unspecified: Secondary | ICD-10-CM | POA: Diagnosis not present

## 2017-01-31 MED ORDER — CALCIUM CARBONATE-VITAMIN D 600-400 MG-UNIT PO TABS: 1.0000 | ORAL_TABLET | Freq: Two times a day (BID) | ORAL | 11 refills | Status: DC

## 2017-01-31 MED ORDER — BUSPIRONE HCL 7.5 MG PO TABS: 7.5000 mg | ORAL_TABLET | Freq: Two times a day (BID) | ORAL | 1 refills | Status: DC

## 2017-01-31 MED ORDER — ALPRAZOLAM 0.5 MG PO TABS: 0.5000 mg | ORAL_TABLET | Freq: Every evening | ORAL | 2 refills | Status: DC | PRN

## 2017-01-31 MED ORDER — FLUOXETINE HCL 20 MG PO TABS: 40.0000 mg | ORAL_TABLET | Freq: Every day | ORAL | 0 refills | Status: DC

## 2017-01-31 MED ORDER — CYCLOBENZAPRINE HCL 5 MG PO TABS: 5.0000 mg | ORAL_TABLET | Freq: Three times a day (TID) | ORAL | 0 refills | Status: DC | PRN

## 2017-01-31 MED ORDER — ALBUTEROL SULFATE HFA 108 (90 BASE) MCG/ACT IN AERS: 1.0000 | INHALATION_SPRAY | RESPIRATORY_TRACT | 3 refills | Status: DC | PRN

## 2017-01-31 NOTE — Progress Notes (Signed)
HPI:                                                                [name] is a 46 y.o. 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 Reports she has been on Lyrica in the past and was intolerant to it. Denies fever, chills, constiuttional symptoms, joint pain, or rash. Denies family history of autoimmune disorders. She would like to have vitamin levels checked.  Depression/Anxiety: taking Prozac and Buspar without difficulty. Has not needed Xanax very often. Compliant with medications. She is currently doing both CBT and EMDR therapy and reports this is helping greatly. She is working Biochemist, clinical. She has not been triggered or had any recent panic attacks. Denies symptoms of mania/hypomania. Denies suicidal thinking. Denies auditory/visual hallucinations.   Requesting refills of Fluoxetine, Xanax and Albuterol today.  Past Medical History:  Diagnosis Date  . Adenomyosis 2013  . Anemia   . Anemia, iron deficiency 12/23/2011  . Anxiety   . Asthma   . Cancer of appendix ([place])   . Complication of anesthesia    scoline pain? from use of Succinylcholine   . Concussion syndrome 07/11/2016  . Cough   . Depression   . Endometriosis   . Generalized headaches   . GERD (gastroesophageal reflux disease)   . Heart murmur   . History of sinus surgery 2010   MAXILLARY, ETHMOID, SPHENOID  . Hypothyroidism   . Iron deficiency anemia   . Kidney stones   . Migraine   . Obesity, Class III, BMI 40-49.9 (morbid obesity) ([place])   . SVD (spontaneous vaginal delivery)    x3  . Vitamin D deficiency  02/02/2017   Past Surgical History:  Procedure Laterality Date  . ABDOMINAL HYSTERECTOMY    . CHOLECYSTECTOMY  01/27/2012   Procedure: LAPAROSCOPIC CHOLECYSTECTOMY;  Surgeon: Harl Bowie, MD;  Location: [facility];  Service: General;  Laterality: N/A;  Laparoscopic chole  . COLPOSCOPY  2007  . KNEE SURGERY     right, scope and ACL repair  . LAPAROSCOPIC APPENDECTOMY  01/02/2012   Procedure: APPENDECTOMY LAPAROSCOPIC;  Surgeon: Harl Bowie, MD;  Location: [facility];  Service: General;  Laterality: N/A;  . LAPAROSCOPIC ASSISTED VAGINAL HYSTERECTOMY  11/13/2011   Procedure: LAPAROSCOPIC ASSISTED VAGINAL HYSTERECTOMY;  Surgeon: Darlyn Chamber, MD;  Location: [facility];  Service: Gynecology;  Laterality: N/A;  . LAPAROSCOPY  05/30/2011   Procedure: LAPAROSCOPY OPERATIVE;  Surgeon: Darlyn Chamber, MD;  Location: [facility];  Service: Gynecology;  Laterality: N/A;  YAG  LASER of Endometriosis.  Marland Kitchen NASAL SINUS SURGERY  2010  . PARTIAL COLECTOMY  01/27/2012   Social History  Substance Use Topics  . Smoking status: Current Every Day Smoker    Packs/day: 0.50    Types: Cigarettes  . Smokeless tobacco: Never Used  . Alcohol use No   family history includes Alcohol abuse in  her brother and father; Asthma in her mother and sister; Colon cancer in her paternal grandmother; Depression in her brother, mother, sister, and sister; Drug abuse in her brother; Lung cancer in her father; Suicidality in her brother.  ROS: negative except as noted in the HPI  Medications: Current Outpatient Prescriptions  Medication Sig Dispense Refill  . albuterol (VENTOLIN HFA) 108 (90 Base) MCG/ACT inhaler Inhale 1-2 puffs into the lungs every 4 (four) hours as needed for wheezing or shortness of breath. 18 g 3  . busPIRone (BUSPAR) 7.5 MG tablet Take 1 tablet (7.5 mg total) by mouth 2 (two) times daily. 180 tablet 1  . FLUoxetine (PROZAC) 20 MG tablet Take 2 tablets (40 mg total) by mouth daily. 180 tablet 0  . fluticasone  (FLONASE) 50 MCG/ACT nasal spray Place 2 sprays into both nostrils daily. 16 g 6  . Fluticasone-Salmeterol (ADVAIR DISKUS) 250-50 MCG/DOSE AEPB Inhale 1 puff into the lungs 2 (two) times daily. 1 each 3  . Lactase (LACTAID PO) Take by mouth.    . levothyroxine (SYNTHROID, LEVOTHROID) 100 MCG tablet Take 1 tablet (100 mcg total) by mouth daily before breakfast. 30 tablet 3  . omeprazole (PRILOSEC) 20 MG capsule Take 20 mg by mouth daily.    . Probiotic Product (PROBIOTIC PO) Take by mouth.    . ranitidine (ZANTAC) 150 MG tablet Take 150 mg by mouth daily.    Marland Kitchen ALPRAZolam (XANAX) 0.5 MG tablet Take 1 tablet (0.5 mg total) by mouth at bedtime as needed for anxiety. 30 tablet 2  . Calcium Carbonate-Vitamin D 600-400 MG-UNIT tablet Take 1 tablet by mouth 2 (two) times daily. 60 tablet 11  . cyclobenzaprine (FLEXERIL) 5 MG tablet Take 1 tablet (5 mg total) by mouth 3 (three) times daily as needed for muscle spasms. 90 tablet 0  . vitamin B-12 (CYANOCOBALAMIN) 1000 MCG tablet Take 1 tablet (1,000 mcg total) by mouth daily. 30 tablet 11  . Vitamin D, Ergocalciferol, (DRISDOL) 50000 units CAPS capsule Take 1 capsule (50,000 Units total) by mouth every 7 (seven) days. Take for 8 total doses(weeks) 8 capsule 0   No current facility-administered medications for this visit.    Allergies  Allergen Reactions  . Reglan [Metoclopramide] Other (See Comments)    Caused a dystonic reaction.  . Ciprofloxacin Nausea And Vomiting  . Clarithromycin Hives  . Moxifloxacin Hives    Objective:  BP 129/85   Pulse 85   Wt 183 lb (83 kg)   LMP 11/02/2011   BMI 31.41 kg/m  Gen:  alert, not ill-appearing, no distress, appropriate for age, obese female HEENT: head normocephalic without obvious abnormality, conjunctiva and cornea clear, trachea midline Pulm: Normal work of breathing, normal phonation  Neuro: alert and oriented x 3, no tremor MSK: back atraumatic, multiple paraspinal trigger points, extremities  atraumatic, normal gait and station Skin: intact, no rashes on exposed skin, no jaundice, no cyanosis Psych: well-groomed, cooperative, good eye contact, euthymic mood, affect mood-congruent, speech is articulate, and thought processes clear and goal-directed  No results found for this or any previous visit (from the past 72 hour(s)). No results found.      Assessment and Plan: 46 y.o. female with   1. Fibromyalgia syndrome - discussed treatment options to include: tapering off of Fluoxetine and starting Cymbalta, adding Gabapentin, or adding muscle relaxer. Patient prefers Flexeril. - CBC - Comprehensive metabolic panel - C-reactive protein - Sedimentation rate - TSH - Vitamin B12 - Vit D  25 hydroxy (rtn  osteoporosis monitoring)  2. PTSD (post-traumatic stress disorder) with anxiety and depression - reducing xanax to 0.5 mg daily as needed for severe anxiety - stable on Prozac 40mg  and Buspar - FLUoxetine (PROZAC) 20 MG tablet; Take 2 tablets (40 mg total) by mouth daily.  Dispense: 180 tablet; Refill: 0  3. Moderate persistent asthma without exacerbation - albuterol (VENTOLIN HFA) 108 (90 Base) MCG/ACT inhaler; Inhale 1-2 puffs into the lungs every 4 (four) hours as needed for wheezing or shortness of breath.  Dispense: 18 g; Refill: 3  4. History of anemia - CBC - Fe+TIBC+Fer  5. History of non anemic vitamin B12 deficiency - Vitamin B12  6. Encounter for vitamin deficiency screening - Vitamin B12 - Vit D  25 hydroxy (rtn osteoporosis monitoring)  7. Acquired hypothyroidism - TSH goal <2.5   Patient education and anticipatory guidance given Patient agrees with treatment plan Follow-up in 4 weeks for fibromyalgia or sooner as needed if symptoms worsen or fail to improve  I spent 25 minutes with this patient, greater than 50% was face-to-face time counseling regarding the above diagnoses  Darlyne Russian PA-C

## 2017-01-31 NOTE — Patient Instructions (Signed)
[phone] to schedule mammogram

## 2017-02-01 LAB — CBC
HCT: 38.9 % (ref 35.0–45.0)
HEMOGLOBIN: 13.6 g/dL (ref 11.7–15.5)
MCH: 31.9 pg (ref 27.0–33.0)
MCHC: 35 g/dL (ref 32.0–36.0)
MCV: 91.3 fL (ref 80.0–100.0)
MPV: 11.4 fL (ref 7.5–12.5)
Platelets: 268 10*3/uL (ref 140–400)
RBC: 4.26 10*6/uL (ref 3.80–5.10)
RDW: 11.9 % (ref 11.0–15.0)
WBC: 9 10*3/uL (ref 3.8–10.8)

## 2017-02-01 LAB — IRON,TIBC AND FERRITIN PANEL
%SAT: 35 % (ref 11–50)
Ferritin: 61 ng/mL (ref 10–232)
IRON: 116 ug/dL (ref 40–190)
TIBC: 330 ug/dL (ref 250–450)

## 2017-02-01 LAB — COMPREHENSIVE METABOLIC PANEL
AG Ratio: 1.6 (calc) (ref 1.0–2.5)
ALT: 13 U/L (ref 6–29)
AST: 15 U/L (ref 10–35)
Albumin: 4.7 g/dL (ref 3.6–5.1)
Alkaline phosphatase (APISO): 65 U/L (ref 33–115)
BUN: 12 mg/dL (ref 7–25)
CHLORIDE: 102 mmol/L (ref 98–110)
CO2: 30 mmol/L (ref 20–32)
CREATININE: 0.69 mg/dL (ref 0.50–1.10)
Calcium: 9.7 mg/dL (ref 8.6–10.2)
GLOBULIN: 2.9 g/dL (ref 1.9–3.7)
GLUCOSE: 109 mg/dL — AB (ref 65–99)
Potassium: 4.1 mmol/L (ref 3.5–5.3)
Sodium: 140 mmol/L (ref 135–146)
Total Bilirubin: 0.7 mg/dL (ref 0.2–1.2)
Total Protein: 7.6 g/dL (ref 6.1–8.1)

## 2017-02-01 LAB — VITAMIN B12: VITAMIN B 12: 317 pg/mL (ref 200–1100)

## 2017-02-01 LAB — C-REACTIVE PROTEIN: CRP: 7.5 mg/L (ref <8.0)

## 2017-02-01 LAB — SEDIMENTATION RATE: SED RATE: 31 mm/h — AB (ref 0–20)

## 2017-02-01 LAB — TSH: TSH: 3.69 mIU/L

## 2017-02-01 LAB — VITAMIN D 25 HYDROXY (VIT D DEFICIENCY, FRACTURES): VIT D 25 HYDROXY: 16 ng/mL — AB (ref 30–100)

## 2017-02-02 ENCOUNTER — Encounter: Payer: Self-pay | Admitting: Physician Assistant

## 2017-02-02 DIAGNOSIS — R700 Elevated erythrocyte sedimentation rate: Secondary | ICD-10-CM | POA: Insufficient documentation

## 2017-02-02 DIAGNOSIS — E559 Vitamin D deficiency, unspecified: Secondary | ICD-10-CM

## 2017-02-02 HISTORY — DX: Vitamin D deficiency, unspecified: E55.9

## 2017-02-02 MED ORDER — VITAMIN B-12 1000 MCG PO TABS: 1000.0000 ug | ORAL_TABLET | Freq: Every day | ORAL | 11 refills | Status: DC

## 2017-02-02 MED ORDER — LEVOTHYROXINE SODIUM 100 MCG PO TABS: 100.0000 ug | ORAL_TABLET | Freq: Every day | ORAL | 3 refills | Status: DC

## 2017-02-02 MED ORDER — VITAMIN D (ERGOCALCIFEROL) 1.25 MG (50000 UNIT) PO CAPS: 50000.0000 [IU] | ORAL_CAPSULE | ORAL | 0 refills | Status: DC

## 2017-02-02 NOTE — Progress Notes (Signed)
Good morning,  Your vitamin D is low and your vitamin B12 is on the low end of normal. Recommend you start an OTC B12 500 mcg supplement daily. I will send a prescription for weekly vitamin D.  Additionally, your TSH is not at goal. I am going to increase your dose to 100 mcg and send a new prescription to your pharmacy. Let's recheck your TSH in 6 weeks.  Let me know if you have any questions or concerns.  Evlyn Clines

## 2017-02-05 DIAGNOSIS — E538 Deficiency of other specified B group vitamins: Secondary | ICD-10-CM | POA: Insufficient documentation

## 2017-02-05 DIAGNOSIS — Z862 Personal history of diseases of the blood and blood-forming organs and certain disorders involving the immune mechanism: Secondary | ICD-10-CM | POA: Insufficient documentation

## 2017-02-05 DIAGNOSIS — Z8639 Personal history of other endocrine, nutritional and metabolic disease: Secondary | ICD-10-CM | POA: Insufficient documentation

## 2017-02-28 ENCOUNTER — Ambulatory Visit: Payer: BLUE CROSS/BLUE SHIELD | Admitting: Physician Assistant

## 2017-02-28 DIAGNOSIS — Z0189 Encounter for other specified special examinations: Secondary | ICD-10-CM

## 2017-03-11 ENCOUNTER — Encounter: Payer: Self-pay | Admitting: Family Medicine

## 2017-03-11 ENCOUNTER — Ambulatory Visit (INDEPENDENT_AMBULATORY_CARE_PROVIDER_SITE_OTHER): Payer: BLUE CROSS/BLUE SHIELD | Admitting: Family Medicine

## 2017-03-11 VITALS — BP 136/87 | HR 90 | Temp 97.8°F | Ht 64.0 in | Wt 178.0 lb

## 2017-03-11 DIAGNOSIS — J029 Acute pharyngitis, unspecified: Secondary | ICD-10-CM | POA: Diagnosis not present

## 2017-03-11 NOTE — Patient Instructions (Addendum)

## 2017-03-11 NOTE — Progress Notes (Signed)
   Subjective:    Patient ID: [name], female    DOB: Jun 20, 1970, 46 y.o.   MRN: [id number]  HPI 46 yo female comes in today with 3 days of sore throat and right ear pain.  No fever, sweats or chills.  She thinks is viral but just wants to check.  Plus her work will not let her return and like she has a note saying it safe to return.  She works as a Marine scientist.  She had a little bit of postnasal drip.  No cough.  She had a couple of ear infections last year so just wants to make sure she does not have a new one.  She is here today with her boyfriend who is visiting from [place].   Review of Systems     Objective:   Physical Exam  Constitutional: She is oriented to person, place, and time. She appears well-developed and well-nourished.  HENT:  Head: Normocephalic and atraumatic.  Right Ear: External ear normal.  Left Ear: External ear normal.  Nose: Nose normal.  Mouth/Throat: Oropharynx is clear and moist.  TMs and canals are clear.   Eyes: Conjunctivae and EOM are normal. Pupils are equal, round, and reactive to light.  Neck: Neck supple. No thyromegaly present.  Cardiovascular: Normal rate, regular rhythm and normal heart sounds.  Pulmonary/Chest: Effort normal and breath sounds normal. She has no wheezes.  Lymphadenopathy:    She has no cervical adenopathy.  Neurological: She is alert and oriented to person, place, and time.  Skin: Skin is warm and dry.  Psychiatric: She has a normal mood and affect.        Assessment & Plan:  Viral pharyngitis - likely viral.  Salt water gargles, humidifier at night, and lots of water and hydration.  Can use Tylenol and ibuprofen for fever and pain relief.  Call if not improving the end of the week.  Work note provided okay to return to work.

## 2017-05-15 ENCOUNTER — Encounter: Payer: Self-pay | Admitting: Physician Assistant

## 2017-05-15 ENCOUNTER — Ambulatory Visit (INDEPENDENT_AMBULATORY_CARE_PROVIDER_SITE_OTHER): Payer: BLUE CROSS/BLUE SHIELD | Admitting: Physician Assistant

## 2017-05-15 VITALS — BP 119/83 | HR 72 | Temp 97.5°F | Wt 180.0 lb

## 2017-05-15 DIAGNOSIS — J069 Acute upper respiratory infection, unspecified: Secondary | ICD-10-CM | POA: Diagnosis not present

## 2017-05-15 DIAGNOSIS — F4310 Post-traumatic stress disorder, unspecified: Secondary | ICD-10-CM

## 2017-05-15 DIAGNOSIS — F17210 Nicotine dependence, cigarettes, uncomplicated: Secondary | ICD-10-CM

## 2017-05-15 DIAGNOSIS — J4540 Moderate persistent asthma, uncomplicated: Secondary | ICD-10-CM | POA: Insufficient documentation

## 2017-05-15 MED ORDER — FLUOXETINE HCL 20 MG PO TABS: 40.0000 mg | ORAL_TABLET | Freq: Every day | ORAL | 1 refills | Status: DC

## 2017-05-15 MED ORDER — ALBUTEROL SULFATE HFA 108 (90 BASE) MCG/ACT IN AERS: 1.0000 | INHALATION_SPRAY | RESPIRATORY_TRACT | 3 refills | Status: DC | PRN

## 2017-05-15 MED ORDER — FLUTICASONE-SALMETEROL 250-50 MCG/DOSE IN AEPB: 1.0000 | INHALATION_SPRAY | Freq: Two times a day (BID) | RESPIRATORY_TRACT | 3 refills | Status: DC

## 2017-05-15 MED ORDER — NICOTINE POLACRILEX 4 MG MT GUM: 4.0000 mg | CHEWING_GUM | OROMUCOSAL | 0 refills | Status: DC | PRN
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
## 2017-05-15 NOTE — Patient Instructions (Addendum)
[phone] to schedule mammogram  Call [facility] to schedule your colonoscopy and cancer surveillance   Upper Respiratory Infection, Adult Most upper respiratory infections (URIs) are caused by a virus. A URI affects the nose, throat, and upper air passages. The most common type of URI is often called "the common cold." Follow these instructions at home:  Take medicines only as told by your doctor.  Gargle warm saltwater or take cough drops to comfort your throat as told by your doctor.  Use a warm mist humidifier or inhale steam from a shower to increase air moisture. This may make it easier to breathe.  Drink enough fluid to keep your pee (urine) clear or pale yellow.  Eat soups and other clear broths.  Have a healthy diet.  Rest as needed.  Go back to work when your fever is gone or your doctor says it is okay. ? You may need to stay home longer to avoid giving your URI to others. ? You can also wear a face mask and wash your hands often to prevent spread of the virus.  Use your inhaler more if you have asthma.  Do not use any tobacco products, including cigarettes, chewing tobacco, or electronic cigarettes. If you need help quitting, ask your doctor. Contact a doctor if:  You are getting worse, not better.  Your symptoms are not helped by medicine.  You have chills.  You are getting more short of breath.  You have brown or red mucus.  You have yellow or brown discharge from your nose.  You have pain in your face, especially when you bend forward.  You have a fever.  You have puffy (swollen) neck glands.  You have pain while swallowing.  You have white areas in the back of your throat. Get help right away if:  You have very bad or constant: ? Headache. ? Ear pain. ? Pain in your forehead, behind your eyes, and over your cheekbones (sinus pain). ? Chest pain.  You have long-lasting (chronic) lung disease and any of the  following: ? Wheezing. ? Long-lasting cough. ? Coughing up blood. ? A change in your usual mucus.  You have a stiff neck.  You have changes in your: ? Vision. ? Hearing. ? Thinking. ? Mood. This information is not intended to replace advice given to you by your health care provider. Make sure you discuss any questions you have with your health care provider. Document Released: 09/18/2007 Document Revised: 12/03/2015 Document Reviewed: 07/07/2013 Elsevier Interactive Patient Education  2018 [employer].

## 2017-05-25 ENCOUNTER — Encounter: Payer: Self-pay | Admitting: Physician Assistant

## 2017-12-06 ENCOUNTER — Emergency Department (HOSPITAL_COMMUNITY): Payer: Self-pay

## 2017-12-06 ENCOUNTER — Emergency Department (HOSPITAL_COMMUNITY)
Admission: EM | Admit: 2017-12-06 | Discharge: 2017-12-06 | Disposition: A | Discharge status: Home / Self Care | Payer: Self-pay | Attending: Emergency Medicine | Admitting: Emergency Medicine

## 2017-12-06 ENCOUNTER — Encounter (HOSPITAL_COMMUNITY): Payer: Self-pay | Admitting: Emergency Medicine

## 2017-12-06 ENCOUNTER — Other Ambulatory Visit: Payer: Self-pay

## 2017-12-06 DIAGNOSIS — E039 Hypothyroidism, unspecified: Secondary | ICD-10-CM | POA: Insufficient documentation

## 2017-12-06 DIAGNOSIS — R55 Syncope and collapse: Secondary | ICD-10-CM | POA: Insufficient documentation

## 2017-12-06 DIAGNOSIS — R079 Chest pain, unspecified: Secondary | ICD-10-CM | POA: Insufficient documentation

## 2017-12-06 DIAGNOSIS — J45909 Unspecified asthma, uncomplicated: Secondary | ICD-10-CM | POA: Insufficient documentation

## 2017-12-06 DIAGNOSIS — F17210 Nicotine dependence, cigarettes, uncomplicated: Secondary | ICD-10-CM | POA: Insufficient documentation

## 2017-12-06 LAB — D-DIMER, QUANTITATIVE (NOT AT ARMC): D DIMER QUANT: 0.3 ug{FEU}/mL (ref 0.00–0.50)

## 2017-12-06 LAB — URINALYSIS, ROUTINE W REFLEX MICROSCOPIC
BILIRUBIN URINE: NEGATIVE
Glucose, UA: NEGATIVE mg/dL
HGB URINE DIPSTICK: NEGATIVE
Ketones, ur: NEGATIVE mg/dL
Leukocytes, UA: NEGATIVE
NITRITE: NEGATIVE
PROTEIN: NEGATIVE mg/dL
Specific Gravity, Urine: 1.008 (ref 1.005–1.030)
pH: 6 (ref 5.0–8.0)

## 2017-12-06 LAB — CBC
HCT: 43.8 % (ref 36.0–46.0)
Hemoglobin: 14.6 g/dL (ref 12.0–15.0)
MCH: 32 pg (ref 26.0–34.0)
MCHC: 33.3 g/dL (ref 30.0–36.0)
MCV: 96.1 fL (ref 78.0–100.0)
PLATELETS: 215 10*3/uL (ref 150–400)
RBC: 4.56 MIL/uL (ref 3.87–5.11)
RDW: 12.4 % (ref 11.5–15.5)
WBC: 7.4 10*3/uL (ref 4.0–10.5)

## 2017-12-06 LAB — BASIC METABOLIC PANEL
ANION GAP: 9 (ref 5–15)
BUN: 14 mg/dL (ref 6–20)
CALCIUM: 9.4 mg/dL (ref 8.9–10.3)
CO2: 28 mmol/L (ref 22–32)
CREATININE: 0.78 mg/dL (ref 0.44–1.00)
Chloride: 103 mmol/L (ref 98–111)
Glucose, Bld: 121 mg/dL — ABNORMAL HIGH (ref 70–99)
Potassium: 3.6 mmol/L (ref 3.5–5.1)
Sodium: 140 mmol/L (ref 135–145)

## 2017-12-06 LAB — I-STAT BETA HCG BLOOD, ED (MC, WL, AP ONLY): I-stat hCG, quantitative: 5.9 m[IU]/mL — ABNORMAL HIGH (ref <5)

## 2017-12-06 LAB — CBG MONITORING, ED: GLUCOSE-CAPILLARY: 119 mg/dL — AB (ref 70–99)

## 2017-12-06 LAB — I-STAT TROPONIN, ED
TROPONIN I, POC: 0.01 ng/mL (ref 0.00–0.08)
Troponin i, poc: 0.01 ng/mL (ref 0.00–0.08)

## 2017-12-06 MED ORDER — OXYCODONE HCL 5 MG PO TABS
5.0000 mg | ORAL_TABLET | Freq: Once | ORAL | Status: AC
  Administered 2017-12-06: 5 mg via ORAL
  Filled 2017-12-06: qty 1

## 2017-12-06 MED ORDER — SODIUM CHLORIDE 0.9 % IV BOLUS
1000.0000 mL | Freq: Once | INTRAVENOUS | Status: AC
  Administered 2017-12-06: 1000 mL via INTRAVENOUS

## 2017-12-06 MED ORDER — ASPIRIN 81 MG PO CHEW
324.0000 mg | CHEWABLE_TABLET | Freq: Once | ORAL | Status: AC
  Administered 2017-12-06: 324 mg via ORAL
  Filled 2017-12-06: qty 4

## 2017-12-06 MED ORDER — KETOROLAC TROMETHAMINE 15 MG/ML IJ SOLN
15.0000 mg | Freq: Once | INTRAMUSCULAR | Status: AC
  Administered 2017-12-06: 15 mg via INTRAVENOUS
  Filled 2017-12-06: qty 1

## 2017-12-06 NOTE — ED Provider Notes (Signed)
[facility] Emergency Department Provider Note MRN:  [id number]  Arrival date & time: 12/06/17     Chief Complaint   Dizziness; Blurred Vision; and Chest Pain   History of Present Illness   [name] is a 47 y.o. 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  Patient explains that she is experienced 3 episodes of severe dizziness over the course of the past 2 weeks.  Last episode occurred this morning, patient was standing from a seated position and explains that her symptoms felt like "her blood pressure was bottoming out".  Significant lightheadedness, feeling as if her vision were fading out.  Denies headache, no numbness or weakness in the arms or legs.  Episode associated with left-sided chest pain, nonradiating, described as sharp.  Pain of the chest is constant, 7 out of 10 in severity currently.  Denies nausea or vomiting, no shortness of breath, notes her chronic abdominal pain which is at baseline, no dysuria.  Review of Systems  A complete 10 system review of systems was obtained and all systems are negative except as noted in the HPI and PMH.   Patient's Health History    Past Medical History:  Diagnosis Date  . Adenomyosis 2013  . Anemia   . Anemia, iron deficiency 12/23/2011  . Anxiety   . Asthma   . Cancer of appendix ([place])   . Complication of anesthesia    scoline pain? from use of Succinylcholine   . Concussion syndrome 07/11/2016  . Cough   . Depression   . Endometriosis   . Generalized headaches   . GERD (gastroesophageal reflux disease)   . Heart murmur   . History of sinus surgery 2010   MAXILLARY, ETHMOID, SPHENOID  . Hypothyroidism   . Iron deficiency anemia   . Kidney stones   . Migraine   . Obesity, Class III, BMI 40-49.9 (morbid obesity) ([place])   . SVD (spontaneous vaginal delivery)    x3  . Vitamin D deficiency  02/02/2017    Past Surgical History:  Procedure Laterality Date  . ABDOMINAL HYSTERECTOMY    . CHOLECYSTECTOMY  01/27/2012   Procedure: LAPAROSCOPIC CHOLECYSTECTOMY;  Surgeon: Harl Bowie, MD;  Location: [place];  Service: General;  Laterality: N/A;  Laparoscopic chole  . COLPOSCOPY  2007  . KNEE SURGERY     right, scope and ACL repair  . LAPAROSCOPIC APPENDECTOMY  01/02/2012   Procedure: APPENDECTOMY LAPAROSCOPIC;  Surgeon: Harl Bowie, MD;  Location[facility];  Service: General;  Laterality: N/A;  . LAPAROSCOPIC ASSISTED VAGINAL HYSTERECTOMY  11/13/2011   Procedure: LAPAROSCOPIC ASSISTED VAGINAL HYSTERECTOMY;  Surgeon: Darlyn Chamber, MD;  Location: [place] [facility];  Service: Gynecology;  Laterality: N/A;  . LAPAROSCOPY  05/30/2011   Procedure: LAPAROSCOPY OPERATIVE;  Surgeon: Darlyn Chamber, MD;  Location: [place] [facility];  Service: Gynecology;  Laterality: N/A;  YAG  LASER of Endometriosis.  Marland Kitchen NASAL SINUS SURGERY  2010  . PARTIAL COLECTOMY  01/27/2012    Family History  Problem Relation Age of Onset  . Lung cancer Father   . Alcohol abuse Father   . Depression Mother   . Asthma Mother   . Depression Sister   . Suicidality Brother   . Depression Brother   . Alcohol abuse Brother   . Drug abuse Brother   . Depression Sister   . Colon cancer Paternal Grandmother   .  Asthma Sister   . Anesthesia problems Neg Hx     Social History   Socioeconomic History  . Marital status: Married    Spouse name: Not on file  . Number of children: 3  . Years of education: Not on file  . Highest education level: Not on file  Occupational History  . Occupation: Programmer, multimedia: [employer]  . Financial resource strain: Not on file  . Food insecurity:    Worry: Not on file    Inability: Not on file  . Transportation needs:    Medical: Not on file    Non-medical: Not on file  Tobacco Use  . Smoking status: Current Every Day Smoker    Packs/day: 0.75    Years: 1.00    Pack years:  0.75    Types: Cigarettes  . Smokeless tobacco: Never Used  Substance and Sexual Activity  . Alcohol use: No  . Drug use: No  . Sexual activity: Yes    Partners: Male    Birth control/protection: Surgical  Lifestyle  . Physical activity:    Days per week: Not on file    Minutes per session: Not on file  . Stress: Not on file  Relationships  . Social connections:    Talks on phone: Not on file    Gets together: Not on file    Attends religious service: Not on file    Active member of club or organization: Not on file    Attends meetings of clubs or organizations: Not on file    Relationship status: Not on file  . Intimate partner violence:    Fear of current or ex partner: Not on file    Emotionally abused: Not on file    Physically abused: Not on file    Forced sexual activity: Not on file  Other Topics Concern  . Not on file  Social History Narrative  . Not on file     Physical Exam  Vital Signs and Nursing Notes reviewed Vitals:   12/06/17 1025 12/06/17 1145  BP: 110/70 109/68  Pulse: 74 64  Resp: 17 19  Temp:    SpO2: 98% 97%    CONSTITUTIONAL: Well-appearing, NAD NEURO:  Alert and oriented x 3, no focal deficits EYES:  eyes equal and reactive ENT/NECK:  no LAD, no JVD CARDIO: Regular rate, well-perfused, normal S1 and S2 PULM:  CTAB no wheezing or rhonchi GI/GU:  normal bowel sounds, non-distended, non-tender MSK/SPINE:  No gross deformities, no edema SKIN:  no rash, atraumatic PSYCH:  Appropriate speech and behavior  Diagnostic and Interventional Summary    EKG Interpretation  Date/Time:  Saturday December 06 2017 07:28:03 EDT Ventricular Rate:  80 PR Interval:  176 QRS Duration: 80 QT Interval:  364 QTC Calculation: 419 R Axis:   69 Text Interpretation:  Normal sinus rhythm Artifact Otherwise within normal limits Confirmed by Carmin Muskrat ([id number]) on 12/06/2017 7:49:24 AM      Labs Reviewed  BASIC METABOLIC PANEL - Abnormal; Notable for the  following components:      Result Value   Glucose, Bld 121 (*)    All other components within normal limits  URINALYSIS, ROUTINE W REFLEX MICROSCOPIC - Abnormal; Notable for the following components:   Color, Urine STRAW (*)    All other components within normal limits  CBG MONITORING, ED - Abnormal; Notable for the following components:   Glucose-Capillary 119 (*)    All other components within  normal limits  I-STAT BETA HCG BLOOD, ED ([facility], [facility], [facility] ONLY) - Abnormal; Notable for the following components:   I-stat hCG, quantitative 5.9 (*)    All other components within normal limits  CBC  D-DIMER, QUANTITATIVE (NOT AT [facility])  I-STAT TROPONIN, ED  I-STAT TROPONIN, ED    DG Chest 2 View  Final Result      Medications  sodium chloride 0.9 % bolus 1,000 mL (0 mLs Intravenous Stopped 12/06/17 0959)  aspirin chewable tablet 324 mg (324 mg Oral Given 12/06/17 0858)  ketorolac (TORADOL) 15 MG/ML injection 15 mg (15 mg Intravenous Given 12/06/17 0900)  oxyCODONE (Oxy IR/ROXICODONE) immediate release tablet 5 mg (5 mg Oral Given 12/06/17 0859)     Procedures Critical Care  ED Course and Medical Decision Making  I have reviewed the triage vital signs and the nursing notes.  Pertinent labs & imaging results that were available during my care of the patient were reviewed by me and considered in my medical decision making (see below for details). Clinical Course as of Dec 06 1212  Sat Dec 07, 4947  2578 47 year old female with a history of colon cancer status post colectomy here with recurrent episodes of significant lightheadedness consistent with presyncope.  Today occurred while standing from a seated position, favored orthostasis.  However, associated with left-sided chest pain, 7/10 in severity.  Question of ACS versus PE, though atypical for both.  D-dimer negative, awaiting second troponin.   [MB]    Clinical Course User Index [MB] Maudie Flakes, MD    Troponins negative x2, patient  feeling much better after IV fluids.  Very little risk factors for cardiac disease, atypical in nature, advise close follow-up with PCP, return to the ED with worsening symptoms.  Encourage fluids at home.  After the discussed management above, the patient was determined to be safe for discharge.  The patient was in agreement with this plan and all questions regarding their care were answered.  ED return precautions were discussed and the patient will return to the ED with any significant worsening of condition.  Barth Kirks. Sedonia Small, [place] [email or web address]  Final Clinical Impressions(s) / ED Diagnoses     ICD-10-CM   1. Chest pain R07.9 DG Chest 2 View    DG Chest 2 View  2. Near syncope R55     ED Discharge Orders    None         Maudie Flakes, MD 12/06/17 1214

## 2017-12-06 NOTE — Discharge Instructions (Addendum)
You were evaluated in the Emergency Department and after careful evaluation, we did not find any emergent condition requiring admission or further testing in the hospital.  Your symptoms today seem to be due to orthostatic hypotension.  Please drink plenty fluids at home and follow-up with your primary care provider.  Please return to the Emergency Department if you experience any worsening of your condition.  We encourage you to follow up with a primary care provider.  Thank you for allowing [place] to be a part of your care.

## 2017-12-06 NOTE — ED Notes (Addendum)
Pt's cbg result: 119, informed Alexa-RN.

## 2017-12-06 NOTE — ED Notes (Addendum)
Pt ambulating to bathroom with strong and steady gait ; pt denies being light headed at this time ; pt states " I feel a lot better at this time , I think I was just dehydrated"

## 2017-12-06 NOTE — ED Triage Notes (Signed)
Pt. Stated, I had 3 episodes of feeling dizzy and having blurred vision . The 1st episode happened 2 weeks and again yesterday and today. They last about 2 hours.

## 2017-12-06 NOTE — ED Notes (Signed)
Pt denies any further Cp at this time

## 2017-12-18 ENCOUNTER — Emergency Department (HOSPITAL_COMMUNITY)
Admission: EM | Admit: 2017-12-18 | Discharge: 2017-12-18 | Disposition: A | Discharge status: Home / Self Care | Payer: Self-pay | Attending: Emergency Medicine | Admitting: Emergency Medicine

## 2017-12-18 ENCOUNTER — Emergency Department (HOSPITAL_COMMUNITY): Payer: Self-pay

## 2017-12-18 ENCOUNTER — Other Ambulatory Visit: Payer: Self-pay

## 2017-12-18 ENCOUNTER — Encounter (HOSPITAL_COMMUNITY): Payer: Self-pay

## 2017-12-18 DIAGNOSIS — Z79899 Other long term (current) drug therapy: Secondary | ICD-10-CM | POA: Insufficient documentation

## 2017-12-18 DIAGNOSIS — J4540 Moderate persistent asthma, uncomplicated: Secondary | ICD-10-CM | POA: Insufficient documentation

## 2017-12-18 DIAGNOSIS — F17210 Nicotine dependence, cigarettes, uncomplicated: Secondary | ICD-10-CM | POA: Insufficient documentation

## 2017-12-18 DIAGNOSIS — N23 Unspecified renal colic: Secondary | ICD-10-CM | POA: Insufficient documentation

## 2017-12-18 DIAGNOSIS — E039 Hypothyroidism, unspecified: Secondary | ICD-10-CM | POA: Insufficient documentation

## 2017-12-18 LAB — BASIC METABOLIC PANEL
Anion gap: 11 (ref 5–15)
BUN: 12 mg/dL (ref 6–20)
CHLORIDE: 107 mmol/L (ref 98–111)
CO2: 24 mmol/L (ref 22–32)
Calcium: 8.9 mg/dL (ref 8.9–10.3)
Creatinine, Ser: 0.79 mg/dL (ref 0.44–1.00)
GFR calc non Af Amer: >60 mL/min (ref >60)
Glucose, Bld: 112 mg/dL — ABNORMAL HIGH (ref 70–99)
POTASSIUM: 3.9 mmol/L (ref 3.5–5.1)
SODIUM: 142 mmol/L (ref 135–145)

## 2017-12-18 LAB — URINALYSIS, ROUTINE W REFLEX MICROSCOPIC
Bilirubin Urine: NEGATIVE
Glucose, UA: NEGATIVE mg/dL
Hgb urine dipstick: NEGATIVE
KETONES UR: NEGATIVE mg/dL
LEUKOCYTES UA: NEGATIVE
NITRITE: NEGATIVE
PH: 6 (ref 5.0–8.0)
Protein, ur: NEGATIVE mg/dL
Specific Gravity, Urine: 1.011 (ref 1.005–1.030)

## 2017-12-18 LAB — CBC
HCT: 40.3 % (ref 36.0–46.0)
HEMOGLOBIN: 13.4 g/dL (ref 12.0–15.0)
MCH: 31.6 pg (ref 26.0–34.0)
MCHC: 33.3 g/dL (ref 30.0–36.0)
MCV: 95 fL (ref 78.0–100.0)
Platelets: 225 10*3/uL (ref 150–400)
RBC: 4.24 MIL/uL (ref 3.87–5.11)
RDW: 12.6 % (ref 11.5–15.5)
WBC: 8.6 10*3/uL (ref 4.0–10.5)

## 2017-12-18 MED ORDER — IBUPROFEN 600 MG PO TABS: 600.0000 mg | ORAL_TABLET | Freq: Three times a day (TID) | ORAL | 0 refills | Status: DC | PRN

## 2017-12-18 MED ORDER — KETOROLAC TROMETHAMINE 30 MG/ML IJ SOLN
30.0000 mg | Freq: Once | INTRAMUSCULAR | Status: AC
  Administered 2017-12-18: 30 mg via INTRAVENOUS
  Filled 2017-12-18: qty 1

## 2017-12-18 MED ORDER — HYDROCODONE-ACETAMINOPHEN 5-325 MG PO TABS: 1.0000 | ORAL_TABLET | ORAL | 0 refills | Status: DC | PRN

## 2017-12-18 MED ORDER — MORPHINE SULFATE (PF) 4 MG/ML IV SOLN
4.0000 mg | Freq: Once | INTRAVENOUS | Status: AC
  Administered 2017-12-18: 4 mg via INTRAVENOUS
  Filled 2017-12-18: qty 1

## 2017-12-18 MED ORDER — ONDANSETRON 8 MG PO TBDP: 8.0000 mg | ORAL_TABLET | Freq: Three times a day (TID) | ORAL | 0 refills | Status: DC | PRN

## 2017-12-18 MED ORDER — ONDANSETRON HCL 4 MG/2ML IJ SOLN
4.0000 mg | Freq: Four times a day (QID) | INTRAMUSCULAR | Status: DC | PRN
  Administered 2017-12-18: 4 mg via INTRAVENOUS
  Filled 2017-12-18: qty 2
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
## 2017-12-18 NOTE — ED Triage Notes (Signed)
Per [facility], pt has history of kidney stones. Passed 1 stone this morning. Pt had 9/10 pain to right flank and syncopal episode. Family called 10

## 2017-12-26 ENCOUNTER — Encounter (HOSPITAL_COMMUNITY): Payer: Self-pay | Admitting: Emergency Medicine

## 2017-12-26 ENCOUNTER — Emergency Department (HOSPITAL_COMMUNITY)
Admission: EM | Admit: 2017-12-26 | Discharge: 2017-12-26 | Disposition: A | Discharge status: Home / Self Care | Payer: Self-pay | Attending: Emergency Medicine | Admitting: Emergency Medicine

## 2017-12-26 ENCOUNTER — Other Ambulatory Visit: Payer: Self-pay

## 2017-12-26 DIAGNOSIS — R109 Unspecified abdominal pain: Secondary | ICD-10-CM | POA: Insufficient documentation

## 2017-12-26 DIAGNOSIS — E039 Hypothyroidism, unspecified: Secondary | ICD-10-CM | POA: Insufficient documentation

## 2017-12-26 DIAGNOSIS — Z79899 Other long term (current) drug therapy: Secondary | ICD-10-CM | POA: Insufficient documentation

## 2017-12-26 DIAGNOSIS — F17210 Nicotine dependence, cigarettes, uncomplicated: Secondary | ICD-10-CM | POA: Insufficient documentation

## 2017-12-26 DIAGNOSIS — Z85038 Personal history of other malignant neoplasm of large intestine: Secondary | ICD-10-CM | POA: Insufficient documentation

## 2017-12-26 DIAGNOSIS — J45909 Unspecified asthma, uncomplicated: Secondary | ICD-10-CM | POA: Insufficient documentation

## 2017-12-26 DIAGNOSIS — R112 Nausea with vomiting, unspecified: Secondary | ICD-10-CM | POA: Insufficient documentation

## 2017-12-26 LAB — CBC
HEMATOCRIT: 39.8 % (ref 36.0–46.0)
Hemoglobin: 13.2 g/dL (ref 12.0–15.0)
MCH: 31.7 pg (ref 26.0–34.0)
MCHC: 33.2 g/dL (ref 30.0–36.0)
MCV: 95.4 fL (ref 78.0–100.0)
PLATELETS: 249 10*3/uL (ref 150–400)
RBC: 4.17 MIL/uL (ref 3.87–5.11)
RDW: 12.8 % (ref 11.5–15.5)
WBC: 11.4 10*3/uL — AB (ref 4.0–10.5)

## 2017-12-26 LAB — BASIC METABOLIC PANEL
ANION GAP: 10 (ref 5–15)
BUN: 16 mg/dL (ref 6–20)
CALCIUM: 9.3 mg/dL (ref 8.9–10.3)
CO2: 27 mmol/L (ref 22–32)
CREATININE: 0.75 mg/dL (ref 0.44–1.00)
Chloride: 104 mmol/L (ref 98–111)
Glucose, Bld: 160 mg/dL — ABNORMAL HIGH (ref 70–99)
Potassium: 3.7 mmol/L (ref 3.5–5.1)
SODIUM: 141 mmol/L (ref 135–145)

## 2017-12-26 LAB — URINALYSIS, ROUTINE W REFLEX MICROSCOPIC
Bilirubin Urine: NEGATIVE
Glucose, UA: NEGATIVE mg/dL
Hgb urine dipstick: NEGATIVE
KETONES UR: NEGATIVE mg/dL
LEUKOCYTES UA: NEGATIVE
NITRITE: NEGATIVE
PH: 5 (ref 5.0–8.0)
Protein, ur: NEGATIVE mg/dL
Specific Gravity, Urine: 1.02 (ref 1.005–1.030)

## 2017-12-26 LAB — I-STAT BETA HCG BLOOD, ED (MC, WL, AP ONLY): HCG, QUANTITATIVE: 6.4 m[IU]/mL — AB (ref <5)

## 2017-12-26 MED ORDER — OXYCODONE-ACETAMINOPHEN 5-325 MG PO TABS: 1.0000 | ORAL_TABLET | ORAL | 0 refills | Status: DC | PRN

## 2017-12-26 MED ORDER — ONDANSETRON HCL 4 MG/2ML IJ SOLN
4.0000 mg | Freq: Once | INTRAMUSCULAR | Status: AC
  Administered 2017-12-26: 4 mg via INTRAVENOUS
  Filled 2017-12-26: qty 2

## 2017-12-26 MED ORDER — HYDROMORPHONE HCL 1 MG/ML IJ SOLN
1.0000 mg | Freq: Once | INTRAMUSCULAR | Status: AC
  Administered 2017-12-26: 1 mg via INTRAVENOUS
  Filled 2017-12-26: qty 1

## 2017-12-26 MED ORDER — KETOROLAC TROMETHAMINE 30 MG/ML IJ SOLN
15.0000 mg | Freq: Once | INTRAMUSCULAR | Status: AC
  Administered 2017-12-26: 15 mg via INTRAVENOUS
  Filled 2017-12-26: qty 1

## 2017-12-26 MED ORDER — METHOCARBAMOL 750 MG PO TABS: 750.0000 mg | ORAL_TABLET | Freq: Two times a day (BID) | ORAL | 0 refills | Status: DC | PRN

## 2017-12-26 NOTE — ED Triage Notes (Signed)
Pt c/o right flank pain that radiates to her abdomen and groin. Hx kidney stones, took 1/2 of a vicodin and 600mg  ibuprofen PTA. Pt unable to sit still, tearful in triage.
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
## 2018-02-19 ENCOUNTER — Ambulatory Visit (INDEPENDENT_AMBULATORY_CARE_PROVIDER_SITE_OTHER): Payer: Self-pay | Admitting: Physician Assistant

## 2018-02-19 ENCOUNTER — Encounter: Payer: Self-pay | Admitting: Physician Assistant

## 2018-02-19 VITALS — BP 120/78 | HR 74 | Temp 98.0°F | Wt 200.0 lb

## 2018-02-19 DIAGNOSIS — J00 Acute nasopharyngitis [common cold]: Secondary | ICD-10-CM

## 2018-02-19 LAB — POCT RAPID STREP A (OFFICE): RAPID STREP A SCREEN: NEGATIVE

## 2018-02-19 MED ORDER — PREDNISONE 50 MG PO TABS: 50.0000 mg | ORAL_TABLET | Freq: Every day | ORAL | 0 refills | Status: DC

## 2018-02-19 MED ORDER — IPRATROPIUM BROMIDE 0.06 % NA SOLN: 2.0000 | Freq: Four times a day (QID) | NASAL | 0 refills | Status: DC | PRN

## 2018-02-19 NOTE — Patient Instructions (Addendum)
For nasal symptoms/sinusitis: - prescription Atrovent nasal spray: 2 sprays each nostril, up to 4 times per day as needed - warning: do not use OTC nasal sprays like Afrin (oxymetazoline) for more than 3 days as it will cause worsening congestion/nasal symptoms) - nasal saline rinses / netti pot (do this prior to nasal spray) - warm facial compresses - oral decongestants and antihistamines like Claritin-D and Zyrtec-D may help dry up secretions (caution using decongestants if you have high blood pressure, heart disease or kidney disease) - for sinus headache: Tylenol 1000mg  every 8 hours as needed  For sore throat: - Tylenol 1000mg  every 8 hours as needed for throat pain. - Cepacol throat lozenges and/or Chloraseptic spray - Warm salt water gargles

## 2018-02-19 NOTE — Progress Notes (Signed)
HPI:                                                                [name] is a 47 y.o. female who presents to [facility] [place] today for URI symptoms  URI   This is a new problem. The current episode started in the past 7 days (x 2 days). The problem has been unchanged. Associated symptoms include congestion, headaches, nausea, a plugged ear sensation and a sore throat. Pertinent negatives include no wheezing. Associated symptoms comments: + malaise. She has tried acetaminophen for the symptoms. The treatment provided mild relief.      Past Medical History:  Diagnosis Date  . Adenomyosis 2013  . Anemia   . Anemia, iron deficiency 12/23/2011  . Anxiety   . Asthma   . Cancer of appendix ([place])   . Complication of anesthesia    scoline pain? from use of Succinylcholine   . Concussion syndrome 07/11/2016  . Cough   . Depression   . Endometriosis   . Generalized headaches   . GERD (gastroesophageal reflux disease)   . Heart murmur   . History of sinus surgery 2010   MAXILLARY, ETHMOID, SPHENOID  . Hypothyroidism   . Iron deficiency anemia   . Kidney stones   . Migraine   . Obesity, Class III, BMI 40-49.9 (morbid obesity) ([place])   . SVD (spontaneous vaginal delivery)    x3  . Vitamin D deficiency 02/02/2017   Past Surgical History:  Procedure Laterality Date  . ABDOMINAL HYSTERECTOMY    . CHOLECYSTECTOMY  01/27/2012   Procedure: LAPAROSCOPIC CHOLECYSTECTOMY;  Surgeon: Harl Bowie, MD;  Location: [facility];  Service: General;  Laterality: N/A;  Laparoscopic chole  . COLPOSCOPY  2007  . KNEE SURGERY     right, scope and ACL repair  . LAPAROSCOPIC APPENDECTOMY  01/02/2012   Procedure: APPENDECTOMY LAPAROSCOPIC;  Surgeon: Harl Bowie, MD;  Location: [facility];  Service: General;  Laterality: N/A;  . LAPAROSCOPIC ASSISTED VAGINAL HYSTERECTOMY  11/13/2011   Procedure: LAPAROSCOPIC ASSISTED VAGINAL HYSTERECTOMY;   Surgeon: Darlyn Chamber, MD;  Location: [facility];  Service: Gynecology;  Laterality: N/A;  . LAPAROSCOPY  05/30/2011   Procedure: LAPAROSCOPY OPERATIVE;  Surgeon: Darlyn Chamber, MD;  Location: [facility];  Service: Gynecology;  Laterality: N/A;  YAG  LASER of Endometriosis.  Marland Kitchen NASAL SINUS SURGERY  2010  . PARTIAL COLECTOMY  01/27/2012   Social History   Tobacco Use  . Smoking status: Current Every Day Smoker    Packs/day: 0.75    Years: 1.00    Pack years: 0.75    Types: Cigarettes  . Smokeless tobacco: Never Used  Substance Use Topics  . Alcohol use: No   family history includes Alcohol abuse in her brother and father; Asthma in her mother and sister; Colon cancer in her paternal grandmother; Depression in her brother, mother, sister, and sister; Drug abuse in her brother; Lung cancer in her father; Suicidality in her brother.    ROS: negative except as noted in the HPI  Medications: Current Outpatient Medications  Medication Sig Dispense Refill  . albuterol (VENTOLIN HFA) 108 (90 Base) MCG/ACT inhaler Inhale 1-2 puffs into the lungs every 4 (four)  hours as needed for wheezing or shortness of breath. 18 g 3  . HYDROcodone-acetaminophen (NORCO/VICODIN) 5-325 MG tablet Take 1 tablet by mouth every 4 (four) hours as needed for moderate pain. 8 tablet 0  . ibuprofen (ADVIL,MOTRIN) 600 MG tablet Take 1 tablet (600 mg total) by mouth every 8 (eight) hours as needed. 15 tablet 0  . ipratropium (ATROVENT) 0.06 % nasal spray Place 2 sprays into both nostrils 4 (four) times daily as needed for rhinitis. 15 mL 0  . levothyroxine (SYNTHROID, LEVOTHROID) 100 MCG tablet Take 1 tablet (100 mcg total) by mouth daily before breakfast. (Patient not taking: Reported on 12/18/2017) 30 tablet 3  . methocarbamol (ROBAXIN) 750 MG tablet Take 1 tablet (750 mg total) by mouth 2 (two) times daily as needed for muscle spasms. 20 tablet 0  . omeprazole (PRILOSEC) 20 MG capsule Take 20 mg by mouth daily.    .  ondansetron (ZOFRAN ODT) 8 MG disintegrating tablet Take 1 tablet (8 mg total) by mouth every 8 (eight) hours as needed for nausea or vomiting. 10 tablet 0  . ondansetron (ZOFRAN) 4 MG tablet Take 1 tablet (4 mg total) by mouth every 8 (eight) hours as needed for nausea or vomiting. 10 tablet 0  . oxyCODONE-acetaminophen (PERCOCET) 5-325 MG tablet Take 1-2 tablets by mouth every 4 (four) hours as needed. 10 tablet 0  . predniSONE (DELTASONE) 50 MG tablet Take 1 tablet (50 mg total) by mouth daily. 5 tablet 0  . Probiotic Product (PROBIOTIC PO) Take 1 tablet by mouth daily.     . ranitidine (ZANTAC) 150 MG tablet Take 150 mg by mouth daily.     No current facility-administered medications for this visit.    Allergies  Allergen Reactions  . Reglan [Metoclopramide] Other (See Comments)    Caused a dystonic reaction.  . Ciprofloxacin Nausea And Vomiting  . Clarithromycin Hives  . Moxifloxacin Hives       Objective:  BP 120/78   Pulse 74   Temp 98 F (36.7 C) (Oral)   Wt 200 lb (90.7 kg)   LMP 11/02/2011   SpO2 100%   BMI 34.33 kg/m  Gen:  alert, not ill-appearing, no distress, appropriate for age 26: head normocephalic without obvious abnormality, conjunctiva and cornea clear, nasal mucosa edematous, erythema of the left tonsillar pillar, no tonsillar exudates, uvula midline, no cervical adenopathy, neck supple, trachea midline Pulm: Normal work of breathing, normal phonation, clear to auscultation  CV: Normal rate, regular rhythm, s1 and s2 distinct, no murmurs, clicks or rubs  Neuro: alert and oriented x 3, no tremor MSK: extremities atraumatic, normal gait and station Skin: intact, no rashes on exposed skin, no jaundice, no cyanosis    Results for orders placed or performed in visit on 02/19/18 (from the past 72 hour(s))  POCT rapid strep A     Status: None   Collection Time: 02/19/18 11:27 AM  Result Value Ref Range   Rapid Strep A Screen Negative Negative   No  results found.    Assessment and Plan: 47 y.o. female with   .Diagnoses and all orders for this visit:  Nasopharyngitis -     POCT rapid strep A -     predniSONE (DELTASONE) 50 MG tablet; Take 1 tablet (50 mg total) by mouth daily. -     ipratropium (ATROVENT) 0.06 % nasal spray; Place 2 sprays into both nostrils 4 (four) times daily as needed for rhinitis.   Afebrile, no tachypnea, no tachycardia,  no adventitious lung sounds POC Strep A negative Supportive care Work note provided    Patient education and anticipatory guidance given Patient agrees with treatment plan Follow-up as needed if symptoms worsen or fail to improve  Darlyne Russian PA-C

## 2018-02-20 ENCOUNTER — Other Ambulatory Visit: Payer: Self-pay | Admitting: Physician Assistant

## 2018-02-20 MED ORDER — ONDANSETRON HCL 4 MG PO TABS: 4.0000 mg | ORAL_TABLET | Freq: Three times a day (TID) | ORAL | 0 refills | Status: DC | PRN

## 2018-02-22 ENCOUNTER — Encounter: Payer: Self-pay | Admitting: Physician Assistant

## 2018-02-25 ENCOUNTER — Ambulatory Visit (INDEPENDENT_AMBULATORY_CARE_PROVIDER_SITE_OTHER): Payer: Self-pay | Admitting: Physician Assistant

## 2018-02-25 ENCOUNTER — Encounter: Payer: Self-pay | Admitting: Physician Assistant

## 2018-02-25 VITALS — BP 123/81 | HR 65 | Wt 194.0 lb

## 2018-02-25 DIAGNOSIS — E063 Autoimmune thyroiditis: Secondary | ICD-10-CM

## 2018-02-25 DIAGNOSIS — M791 Myalgia, unspecified site: Secondary | ICD-10-CM

## 2018-02-25 DIAGNOSIS — E559 Vitamin D deficiency, unspecified: Secondary | ICD-10-CM

## 2018-02-25 DIAGNOSIS — E038 Other specified hypothyroidism: Secondary | ICD-10-CM

## 2018-02-25 DIAGNOSIS — F4310 Post-traumatic stress disorder, unspecified: Secondary | ICD-10-CM

## 2018-02-25 DIAGNOSIS — Z1321 Encounter for screening for nutritional disorder: Secondary | ICD-10-CM

## 2018-02-25 MED ORDER — LEVOTHYROXINE SODIUM 100 MCG PO TABS: 100.0000 ug | ORAL_TABLET | Freq: Every day | ORAL | 0 refills | Status: DC

## 2018-02-25 MED ORDER — ALPRAZOLAM 0.5 MG PO TABS: 0.2500 mg | ORAL_TABLET | Freq: Once | ORAL | 0 refills | Status: DC | PRN

## 2018-02-25 MED ORDER — METHOCARBAMOL 750 MG PO TABS: 750.0000 mg | ORAL_TABLET | Freq: Two times a day (BID) | ORAL | 0 refills | Status: DC | PRN

## 2018-02-25 MED ORDER — FLUOXETINE HCL 10 MG PO CAPS: ORAL_CAPSULE | ORAL | 0 refills | Status: DC

## 2018-02-25 MED ORDER — METHOCARBAMOL 750 MG PO TABS: 375.0000 mg | ORAL_TABLET | Freq: Two times a day (BID) | ORAL | 3 refills | Status: DC | PRN
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
## 2018-03-01 ENCOUNTER — Encounter: Payer: Self-pay | Admitting: Physician Assistant

## 2018-05-22 ENCOUNTER — Ambulatory Visit (INDEPENDENT_AMBULATORY_CARE_PROVIDER_SITE_OTHER): Payer: Self-pay | Admitting: Physician Assistant

## 2018-05-22 ENCOUNTER — Encounter: Payer: Self-pay | Admitting: Physician Assistant

## 2018-05-22 VITALS — BP 144/84 | HR 73 | Temp 97.8°F | Wt 194.0 lb

## 2018-05-22 DIAGNOSIS — J45901 Unspecified asthma with (acute) exacerbation: Secondary | ICD-10-CM

## 2018-05-22 DIAGNOSIS — J019 Acute sinusitis, unspecified: Secondary | ICD-10-CM

## 2018-05-22 DIAGNOSIS — F4310 Post-traumatic stress disorder, unspecified: Secondary | ICD-10-CM

## 2018-05-22 MED ORDER — AMOXICILLIN-POT CLAVULANATE 875-125 MG PO TABS: 1.0000 | ORAL_TABLET | Freq: Two times a day (BID) | ORAL | 0 refills | Status: AC

## 2018-05-22 MED ORDER — ALPRAZOLAM 0.5 MG PO TABS: 0.2500 mg | ORAL_TABLET | Freq: Once | ORAL | 0 refills | Status: DC | PRN

## 2018-05-22 MED ORDER — FLUCONAZOLE 150 MG PO TABS: 150.0000 mg | ORAL_TABLET | Freq: Once | ORAL | 0 refills | Status: AC

## 2018-05-22 MED ORDER — PREDNISONE 50 MG PO TABS: ORAL_TABLET | ORAL | 0 refills | Status: DC

## 2018-05-22 MED ORDER — FLUOXETINE HCL 20 MG PO CAPS: 40.0000 mg | ORAL_CAPSULE | Freq: Every day | ORAL | 0 refills | Status: DC

## 2018-05-22 NOTE — Patient Instructions (Signed)
Sinusitis, Adult  Sinusitis is inflammation of your sinuses. Sinuses are hollow spaces in the bones around your face. Your sinuses are located:   Around your eyes.   In the middle of your forehead.   Behind your nose.   In your cheekbones.  Mucus normally drains out of your sinuses. When your nasal tissues become inflamed or swollen, mucus can become trapped or blocked. This allows bacteria, viruses, and fungi to grow, which leads to infection. Most infections of the sinuses are caused by a virus.  Sinusitis can develop quickly. It can last for up to 4 weeks (acute) or for more than 12 weeks (chronic). Sinusitis often develops after a cold.  What are the causes?  This condition is caused by anything that creates swelling in the sinuses or stops mucus from draining. This includes:   Allergies.   Asthma.   Infection from bacteria or viruses.   Deformities or blockages in your nose or sinuses.   Abnormal growths in the nose (nasal polyps).   Pollutants, such as chemicals or irritants in the air.   Infection from fungi (rare).  What increases the risk?  You are more likely to develop this condition if you:   Have a weak body defense system (immune system).   Do a lot of swimming or diving.   Overuse nasal sprays.   Smoke.  What are the signs or symptoms?  The main symptoms of this condition are pain and a feeling of pressure around the affected sinuses. Other symptoms include:   Stuffy nose or congestion.   Thick drainage from your nose.   Swelling and warmth over the affected sinuses.   Headache.   Upper toothache.   A cough that may get worse at night.   Extra mucus that collects in the throat or the back of the nose (postnasal drip).   Decreased sense of smell and taste.   Fatigue.   A fever.   Sore throat.   Bad breath.  How is this diagnosed?  This condition is diagnosed based on:   Your symptoms.   Your medical history.   A physical exam.   Tests to find out if your condition is  acute or chronic. This may include:  ? Checking your nose for nasal polyps.  ? Viewing your sinuses using a device that has a light (endoscope).  ? Testing for allergies or bacteria.  ? Imaging tests, such as an MRI or CT scan.  In rare cases, a bone biopsy may be done to rule out more serious types of fungal sinus disease.  How is this treated?  Treatment for sinusitis depends on the cause and whether your condition is chronic or acute.   If caused by a virus, your symptoms should go away on their own within 10 days. You may be given medicines to relieve symptoms. They include:  ? Medicines that shrink swollen nasal passages (topical intranasal decongestants).  ? Medicines that treat allergies (antihistamines).  ? A spray that eases inflammation of the nostrils (topical intranasal corticosteroids).  ? Rinses that help get rid of thick mucus in your nose (nasal saline washes).   If caused by bacteria, your health care provider may recommend waiting to see if your symptoms improve. Most bacterial infections will get better without antibiotic medicine. You may be given antibiotics if you have:  ? A severe infection.  ? A weak immune system.   If caused by narrow nasal passages or nasal polyps, you may need   to have surgery.  Follow these instructions at home:  Medicines   Take, use, or apply over-the-counter and prescription medicines only as told by your health care provider. These may include nasal sprays.   If you were prescribed an antibiotic medicine, take it as told by your health care provider. Do not stop taking the antibiotic even if you start to feel better.  Hydrate and humidify     Drink enough fluid to keep your urine pale yellow. Staying hydrated will help to thin your mucus.   Use a cool mist humidifier to keep the humidity level in your home above 50%.   Inhale steam for 10-15 minutes, 3-4 times a day, or as told by your health care provider. You can do this in the bathroom while a hot shower is  running.   Limit your exposure to cool or dry air.  Rest   Rest as much as possible.   Sleep with your head raised (elevated).   Make sure you get enough sleep each night.  General instructions     Apply a warm, moist washcloth to your face 3-4 times a day or as told by your health care provider. This will help with discomfort.   Wash your hands often with soap and water to reduce your exposure to germs. If soap and water are not available, use hand sanitizer.   Do not smoke. Avoid being around people who are smoking (secondhand smoke).   Keep all follow-up visits as told by your health care provider. This is important.  Contact a health care provider if:   You have a fever.   Your symptoms get worse.   Your symptoms do not improve within 10 days.  Get help right away if:   You have a severe headache.   You have persistent vomiting.   You have severe pain or swelling around your face or eyes.   You have vision problems.   You develop confusion.   Your neck is stiff.   You have trouble breathing.  Summary   Sinusitis is soreness and inflammation of your sinuses. Sinuses are hollow spaces in the bones around your face.   This condition is caused by nasal tissues that become inflamed or swollen. The swelling traps or blocks the flow of mucus. This allows bacteria, viruses, and fungi to grow, which leads to infection.   If you were prescribed an antibiotic medicine, take it as told by your health care provider. Do not stop taking the antibiotic even if you start to feel better.   Keep all follow-up visits as told by your health care provider. This is important.  This information is not intended to replace advice given to you by your health care provider. Make sure you discuss any questions you have with your health care provider.  Document Released: 04/01/2005 Document Revised: 09/01/2017 Document Reviewed: 09/01/2017  Elsevier Interactive Patient Education  2019 [employer] Inc[employer]
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
## 2018-05-29 ENCOUNTER — Encounter: Payer: Self-pay | Admitting: Physician Assistant

## 2018-06-08 ENCOUNTER — Other Ambulatory Visit: Payer: Self-pay | Admitting: Physician Assistant

## 2018-06-08 ENCOUNTER — Encounter: Payer: Self-pay | Admitting: Physician Assistant

## 2018-06-08 ENCOUNTER — Other Ambulatory Visit: Payer: Self-pay

## 2018-06-08 ENCOUNTER — Ambulatory Visit (INDEPENDENT_AMBULATORY_CARE_PROVIDER_SITE_OTHER): Payer: Self-pay | Admitting: Physician Assistant

## 2018-06-08 VITALS — BP 148/87 | HR 81 | Temp 97.9°F

## 2018-06-08 DIAGNOSIS — M940 Chondrocostal junction syndrome [Tietze]: Secondary | ICD-10-CM

## 2018-06-08 DIAGNOSIS — J101 Influenza due to other identified influenza virus with other respiratory manifestations: Secondary | ICD-10-CM

## 2018-06-08 LAB — POCT INFLUENZA A/B
INFLUENZA A, POC: NEGATIVE
Influenza B, POC: POSITIVE — AB

## 2018-06-08 MED ORDER — HYDROCODONE-HOMATROPINE 5-1.5 MG/5ML PO SYRP: 5.0000 mL | ORAL_SOLUTION | Freq: Four times a day (QID) | ORAL | 0 refills | Status: AC | PRN

## 2018-06-08 NOTE — Patient Instructions (Signed)

## 2018-06-08 NOTE — Progress Notes (Signed)
HPI:                                                                AMBERA [name] is a 48 y.o. female who presents to [facility]: [place] today for cough  Cough  This is a new problem. The current episode started in the past 7 days. The problem has been unchanged. The cough is productive of purulent sputum. Associated symptoms include chest pain (left-sided, worse with coughing), chills, a fever, myalgias and nasal congestion. Pertinent negatives include no shortness of breath. Nothing aggravates the symptoms. Risk factors for lung disease include travel. Her past medical history is significant for pneumonia (2017).    Symptom onset - Tuesday  Duration - 6 days Last fever - 2 days ago, 85 F Recent travel to [place] 2/17-2/22 She was visiting a family member in the hospital. She also works as an Warden/ranger.   Past Medical History:  Diagnosis Date  . Adenomyosis 2013  . Anemia   . Anemia, iron deficiency 12/23/2011  . Anxiety   . Asthma   . Cancer of appendix ([place])   . Complication of anesthesia    scoline pain? from use of Succinylcholine   . Concussion syndrome 07/11/2016  . Cough   . Depression   . Endometriosis   . Generalized headaches   . GERD (gastroesophageal reflux disease)   . Heart murmur   . History of sinus surgery 2010   MAXILLARY, ETHMOID, SPHENOID  . Hypothyroidism   . Iron deficiency anemia   . Kidney stones   . Migraine   . Obesity, Class III, BMI 40-49.9 (morbid obesity) ([place])   . SVD (spontaneous vaginal delivery)    x3  . Vitamin D deficiency 02/02/2017   Past Surgical History:  Procedure Laterality Date  . ABDOMINAL HYSTERECTOMY    . CHOLECYSTECTOMY  01/27/2012   Procedure: LAPAROSCOPIC CHOLECYSTECTOMY;  Surgeon: Harl Bowie, MD;  Location: [facility];  Service: General;  Laterality: N/A;  Laparoscopic chole  . COLPOSCOPY  2007  . KNEE SURGERY     right, scope and ACL repair  . LAPAROSCOPIC  APPENDECTOMY  01/02/2012   Procedure: APPENDECTOMY LAPAROSCOPIC;  Surgeon: Harl Bowie, MD;  Location: [facility];  Service: General;  Laterality: N/A;  . LAPAROSCOPIC ASSISTED VAGINAL HYSTERECTOMY  11/13/2011   Procedure: LAPAROSCOPIC ASSISTED VAGINAL HYSTERECTOMY;  Surgeon: Darlyn Chamber, MD;  Location: [facility];  Service: Gynecology;  Laterality: N/A;  . LAPAROSCOPY  05/30/2011   Procedure: LAPAROSCOPY OPERATIVE;  Surgeon: Darlyn Chamber, MD;  Location: [facility];  Service: Gynecology;  Laterality: N/A;  YAG  LASER of Endometriosis.  Marland Kitchen NASAL SINUS SURGERY  2010  . PARTIAL COLECTOMY  01/27/2012   Social History   Tobacco Use  . Smoking status: Current Every Day Smoker    Packs/day: 0.75    Years: 1.00    Pack years: 0.75    Types: Cigarettes  . Smokeless tobacco: Never Used  Substance Use Topics  . Alcohol use: No   family history includes Alcohol abuse in her brother and father; Asthma in her mother and sister; Colon cancer in her paternal grandmother; Depression in her brother, mother, sister, and sister; Drug abuse in her brother; Lung cancer  in her father; Suicidality in her brother.    ROS: negative except as noted in the HPI  Medications: Current Outpatient Medications  Medication Sig Dispense Refill  . albuterol (VENTOLIN HFA) 108 (90 Base) MCG/ACT inhaler Inhale 1-2 puffs into the lungs every 4 (four) hours as needed for wheezing or shortness of breath. 18 g 3  . ALPRAZolam (XANAX) 0.5 MG tablet Take 0.5-1 tablets (0.25-0.5 mg total) by mouth once as needed for up to 1 dose for anxiety. 20 tablet 0  . FLUoxetine (PROZAC) 20 MG capsule Take 2 capsules (40 mg total) by mouth at bedtime. 180 capsule 0  . HYDROcodone-homatropine (HYCODAN) 5-1.5 MG/5ML syrup Take 5 mLs by mouth every 6 (six) hours as needed for up to 5 days for cough. 150 mL 0  . ipratropium (ATROVENT) 0.06 % nasal spray Place 2 sprays into both nostrils 4 (four) times daily as needed for rhinitis. 15 mL 0  .  levothyroxine (SYNTHROID, LEVOTHROID) 100 MCG tablet Take 1 tablet (100 mcg total) by mouth daily before breakfast. 90 tablet 0  . methocarbamol (ROBAXIN) 750 MG tablet Take 0.5-1 tablets (375-750 mg total) by mouth 2 (two) times daily as needed for muscle spasms. 20 tablet 3  . omeprazole (PRILOSEC) 20 MG capsule Take 20 mg by mouth daily.    . Probiotic Product (PROBIOTIC PO) Take 1 tablet by mouth daily.     . ranitidine (ZANTAC) 150 MG tablet Take 150 mg by mouth daily.     No current facility-administered medications for this visit.    Allergies  Allergen Reactions  . Reglan [Metoclopramide] Other (See Comments)    Caused a dystonic reaction.  . Ciprofloxacin Nausea And Vomiting  . Clarithromycin Hives  . Moxifloxacin Hives       Objective:  BP (!) 148/87   Pulse 81   Temp 97.9 F (36.6 C) (Oral)   LMP 11/02/2011   SpO2 100%  Gen:  alert, ill-appearing, no distress, appropriate for age 37: head normocephalic without obvious abnormality, conjunctiva and cornea clear, trachea midline Pulm: Normal work of breathing, normal phonation, clear to auscultation bilaterally, no wheezes, rales or rhonchi CV: Normal rate, regular rhythm, s1 and s2 distinct, no murmurs, clicks or rubs  Neuro: alert and oriented x 3, no tremor MSK: extremities atraumatic, normal gait and station Chest wall: left anterior chest wall tenderness Skin: intact, no rashes on exposed skin, no jaundice, no cyanosis    Results for orders placed or performed in visit on 06/08/18 (from the past 72 hour(s))  POCT Influenza A/B     Status: Abnormal   Collection Time: 06/08/18  3:04 PM  Result Value Ref Range   Influenza A, POC Negative Negative   Influenza B, POC Positive (A) Negative   No results found.    Assessment and Plan: 48 y.o. female with   .[name] was seen today for cough.  Diagnoses and all orders for this visit:  Influenza B -     POCT Influenza A/B -     HYDROcodone-homatropine  (HYCODAN) 5-1.5 MG/5ML syrup; Take 5 mLs by mouth every 6 (six) hours as needed for up to 5 days for cough.  Acute costochondritis     Afebrile, no tachypnea, no tachycardia, pulse ox 100% on room air at rest, no adventitious lung sounds She is well outside the window for Tamiflu unfortunately She has some risk factors for pneumonia including recent travel, history of pneumonia, and occupational exposure.  If symptoms worsen or fail to improve  within the next 72 hours recommend she return for chest x-ray Counseled on supportive care Hycodan as needed for nocturnal cough.  She understands not to combine this with her alprazolam and understands the serious risks of respiratory depression and death Naproxen 1 to 2 capsules as needed for chest wall pain as well as heating pad   Patient education and anticipatory guidance given Patient agrees with treatment plan Follow-up as needed if symptoms worsen or fail to improve  Darlyne Russian PA-C

## 2018-06-10 ENCOUNTER — Encounter: Payer: Self-pay | Admitting: Physician Assistant

## 2018-07-27 ENCOUNTER — Telehealth: Payer: Self-pay | Admitting: Physician Assistant

## 2018-07-27 NOTE — Telephone Encounter (Signed)
Pt advised to contact her employer-equivalent of health at work. She is a weekend option so she is off work until Friday.

## 2018-07-27 NOTE — Telephone Encounter (Signed)
She should also contact her employer-equivalent of Health at Work Based on those symptoms, she should not return to work for at least 7 days from symptom onset Work note can be provided with an Designer, [employer]ellery

## 2018-07-27 NOTE — Telephone Encounter (Signed)
Called Patient. 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 She states yesterday she had a low grade fever, did not get above 99.9. Experienced some overall fatigue/body aches, sore throat, dry cough. Does suffer from fibromyalgia and seasonal allergies. Currently taking zyrtec and Flonase. States she feels better today. Temp WNL. Declined need for visit at this time. Advised her to continue to monitor from home, and contact us if symptoms persist or fever returns and we will get her scheduled for virtual visit. Verbalized understanding.

## 2018-07-27 NOTE — Telephone Encounter (Signed)
Having fever, aches, headache, sore throat and cough. Worried

## 2018-07-29 NOTE — Telephone Encounter (Signed)
Thank you :)

## 2018-09-08 ENCOUNTER — Ambulatory Visit (INDEPENDENT_AMBULATORY_CARE_PROVIDER_SITE_OTHER): Payer: BLUE CROSS/BLUE SHIELD | Admitting: Physician Assistant

## 2018-09-08 ENCOUNTER — Encounter: Payer: Self-pay | Admitting: Physician Assistant

## 2018-09-08 VITALS — HR 82 | Temp 97.6°F | Wt 187.0 lb

## 2018-09-08 DIAGNOSIS — E063 Autoimmune thyroiditis: Secondary | ICD-10-CM

## 2018-09-08 DIAGNOSIS — E538 Deficiency of other specified B group vitamins: Secondary | ICD-10-CM | POA: Diagnosis not present

## 2018-09-08 DIAGNOSIS — E038 Other specified hypothyroidism: Secondary | ICD-10-CM | POA: Diagnosis not present

## 2018-09-08 DIAGNOSIS — R232 Flushing: Secondary | ICD-10-CM

## 2018-09-08 DIAGNOSIS — E559 Vitamin D deficiency, unspecified: Secondary | ICD-10-CM

## 2018-09-08 DIAGNOSIS — Z90711 Acquired absence of uterus with remaining cervical stump: Secondary | ICD-10-CM

## 2018-09-08 DIAGNOSIS — Z862 Personal history of diseases of the blood and blood-forming organs and certain disorders involving the immune mechanism: Secondary | ICD-10-CM

## 2018-09-08 DIAGNOSIS — F4310 Post-traumatic stress disorder, unspecified: Secondary | ICD-10-CM

## 2018-09-08 DIAGNOSIS — Z8639 Personal history of other endocrine, nutritional and metabolic disease: Secondary | ICD-10-CM

## 2018-09-08 DIAGNOSIS — Z79899 Other long term (current) drug therapy: Secondary | ICD-10-CM

## 2018-09-08 DIAGNOSIS — E782 Mixed hyperlipidemia: Secondary | ICD-10-CM

## 2018-09-08 MED ORDER — ALPRAZOLAM 0.5 MG PO TABS: 0.2500 mg | ORAL_TABLET | Freq: Once | ORAL | 2 refills | Status: DC | PRN

## 2018-09-08 MED ORDER — FLUOXETINE HCL 20 MG PO CAPS: 60.0000 mg | ORAL_CAPSULE | Freq: Every day | ORAL | 0 refills | Status: DC

## 2018-09-08 MED ORDER — LEVOTHYROXINE SODIUM 100 MCG PO TABS: 100.0000 ug | ORAL_TABLET | Freq: Every day | ORAL | 0 refills | Status: DC

## 2018-09-08 NOTE — Progress Notes (Signed)
Virtual Visit via Video Note  I connected with [name] on 09/08/18 at  1:40 PM EDT by a video enabled telemedicine application and verified that I am speaking with the correct person using two identifiers.   I discussed the limitations of evaluation and management by telemedicine and the availability of in person appointments. The patient expressed understanding and agreed to proceed.  History of Present Illness: HPI:                                                                [name] is a 48 y.o. female   CC: medication refills  Chronic PTSD: taking Fluoxetine 40 mg daily. Increased anxiety symptoms over the last 2-3 months. Sense of impending doom all the time. Feels more easily triggered. Has been unable to attend therapy due to COVID-19 Stressors - husband/marital stress, work, pandemic  Hypothyroidism: takes synthroid 100 mcg. Has missed several doses. Denies chest pain, heart palpitations, tremor, GI symptoms, or skin changes.   Vasomotor symptoms: s/p hysterectomy 2013. Approx 2 months ago began having hot flashes described as "like a match lit inside me." She is having occasional nightsweats and sometimes waking up feeling hot. She would like lab testing for menopause.  Depression screen [facility] 2/9 09/08/2018 02/25/2018 05/15/2017 08/20/2016 07/24/2016  Decreased Interest 1 0 0 2 2  Down, Depressed, Hopeless 1 1 0 1 1  PHQ - 2 Score 2 1 0 3 3  Altered sleeping 2 1 0 2 2  Tired, decreased energy 2 0 1 2 1   Change in appetite 1 0 0 3 3  Feeling bad or failure about yourself  2 2 0 1 1  Trouble concentrating 1 1 0 2 2  Moving slowly or fidgety/restless 0 0 0 3 3  Suicidal thoughts 0 0 0 0 0  PHQ-9 Score 10 5 1 16 15   Difficult doing work/chores - Somewhat difficult Not difficult at all - -  Some recent data might be hidden    GAD 7 : Generalized Anxiety Score 09/08/2018 02/25/2018 05/15/2017 08/20/2016  Nervous, Anxious, on Edge 2 1 1 3   Control/stop  worrying 2 3 1 3   Worry too much - different things 2 3 1 3   Trouble relaxing 2 2 0 3  Restless 1 1 0 3  Easily annoyed or irritable 3 2 1 3   Afraid - awful might happen 1 0 0 3  Total GAD 7 Score 13 12 4 21   Anxiety Difficulty - Somewhat difficult Not difficult at all -      Past Medical History:  Diagnosis Date  . Adenomyosis 2013  . Anemia   . Anemia, iron deficiency 12/23/2011  . Anxiety   . Asthma   . Cancer of appendix ([place])   . Complication of anesthesia    scoline pain? from use of Succinylcholine   . Concussion syndrome 07/11/2016  . Cough   . Depression   . Endometriosis   . Generalized headaches   . GERD (gastroesophageal reflux disease)   . Heart murmur   . History of sinus surgery 2010   MAXILLARY, ETHMOID, SPHENOID  . Hypothyroidism   . Iron deficiency anemia   . Kidney stones   . Migraine   . Obesity, Class III, BMI  40-49.9 (morbid obesity) ([place])   . SVD (spontaneous vaginal delivery)    x3  . Vitamin D deficiency 02/02/2017   Past Surgical History:  Procedure Laterality Date  . ABDOMINAL HYSTERECTOMY    . CHOLECYSTECTOMY  01/27/2012   Procedure: LAPAROSCOPIC CHOLECYSTECTOMY;  Surgeon: Harl Bowie, MD;  Location: [facility];  Service: General;  Laterality: N/A;  Laparoscopic chole  . COLPOSCOPY  2007  . KNEE SURGERY     right, scope and ACL repair  . LAPAROSCOPIC APPENDECTOMY  01/02/2012   Procedure: APPENDECTOMY LAPAROSCOPIC;  Surgeon: Harl Bowie, MD;  Location: [facility];  Service: General;  Laterality: N/A;  . LAPAROSCOPIC ASSISTED VAGINAL HYSTERECTOMY  11/13/2011   Procedure: LAPAROSCOPIC ASSISTED VAGINAL HYSTERECTOMY;  Surgeon: Darlyn Chamber, MD;  Location: [facility];  Service: Gynecology;  Laterality: N/A;  . LAPAROSCOPY  05/30/2011   Procedure: LAPAROSCOPY OPERATIVE;  Surgeon: Darlyn Chamber, MD;  Location: [facility];  Service: Gynecology;  Laterality: N/A;  YAG  LASER of Endometriosis.  Marland Kitchen NASAL SINUS SURGERY  2010  . PARTIAL COLECTOMY   01/27/2012   Social History   Tobacco Use  . Smoking status: Current Every Day Smoker    Packs/day: 0.75    Years: 1.00    Pack years: 0.75    Types: Cigarettes  . Smokeless tobacco: Never Used  Substance Use Topics  . Alcohol use: No   family history includes Alcohol abuse in her brother and father; Asthma in her mother and sister; Colon cancer in her paternal grandmother; Depression in her brother, mother, sister, and sister; Drug abuse in her brother; Lung cancer in her father; Suicidality in her brother.    ROS: negative except as noted in the HPI  Medications: Current Outpatient Medications  Medication Sig Dispense Refill  . albuterol (VENTOLIN HFA) 108 (90 Base) MCG/ACT inhaler Inhale 1-2 puffs into the lungs every 4 (four) hours as needed for wheezing or shortness of breath. 18 g 3  . ALPRAZolam (XANAX) 0.5 MG tablet Take 0.5-1 tablets (0.25-0.5 mg total) by mouth once as needed for up to 1 dose for anxiety. 20 tablet 0  . FLUoxetine (PROZAC) 20 MG capsule Take 2 capsules (40 mg total) by mouth at bedtime. 180 capsule 0  . levothyroxine (SYNTHROID, LEVOTHROID) 100 MCG tablet Take 1 tablet (100 mcg total) by mouth daily before breakfast. 90 tablet 0  . methocarbamol (ROBAXIN) 750 MG tablet Take 0.5-1 tablets (375-750 mg total) by mouth 2 (two) times daily as needed for muscle spasms. 20 tablet 3  . omeprazole (PRILOSEC) 20 MG capsule Take 20 mg by mouth daily.    . Probiotic Product (PROBIOTIC PO) Take 1 tablet by mouth daily.     Marland Kitchen ipratropium (ATROVENT) 0.06 % nasal spray Place 2 sprays into both nostrils 4 (four) times daily as needed for rhinitis. (Patient not taking: Reported on 09/08/2018) 15 mL 0  . ranitidine (ZANTAC) 150 MG tablet Take 150 mg by mouth daily.     No current facility-administered medications for this visit.    Allergies  Allergen Reactions  . Reglan [Metoclopramide] Other (See Comments)    Caused a dystonic reaction.  . Ciprofloxacin Nausea And  Vomiting  . Clarithromycin Hives  . Moxifloxacin Hives       Objective:  Pulse 82   Temp 97.6 F (36.4 C) (Axillary)   Wt 187 lb (84.8 kg)   LMP 11/02/2011   BMI 32.10 kg/m  Gen:  alert, not ill-appearing, no distress, appropriate for age  HEENT: head normocephalic without obvious abnormality, conjunctiva and cornea clear, trachea midline Pulm: Normal work of breathing, normal phonation Neuro: alert and oriented x 3 Psych: cooperative, anxious mood, affect mood-congruent, speech is articulate, normal rate and volume; thought processes clear and goal-directed, normal judgment, good insight   No results found for this or any previous visit (from the past 2160 hour(s)).   No results found for this or any previous visit (from the past 72 hour(s)). No results found.  Lab Results  Component Value Date   TSH 3.69 01/31/2017   Lab Results  Component Value Date   CREATININE 0.75 12/26/2017   BUN 16 12/26/2017   NA 141 12/26/2017   K 3.7 12/26/2017   CL 104 12/26/2017   CO2 27 12/26/2017   Lab Results  Component Value Date   ALT 13 01/31/2017   AST 15 01/31/2017   ALKPHOS 70 09/25/2016   BILITOT 0.7 01/31/2017     Assessment and Plan: 48 y.o. female with   .Diagnoses and all orders for this visit:  Vitamin D deficiency -     VITAMIN D 25 Hydroxy (Vit-D Deficiency, Fractures) -     ergocalciferol (VITAMIN D2) 1.25 MG (50000 UT) capsule; Take 1 capsule (50,000 Units total) by mouth once a week for 8 doses.  PTSD (post-traumatic stress disorder) with anxiety and depression -     ALPRAZolam (XANAX) 0.5 MG tablet; Take 0.5-1 tablets (0.25-0.5 mg total) by mouth once as needed for up to 1 dose for anxiety. -     FLUoxetine (PROZAC) 20 MG capsule; Take 3 capsules (60 mg total) by mouth at bedtime.  Hypothyroidism due to Hashimoto's thyroiditis -     levothyroxine (SYNTHROID) 100 MCG tablet; Take 1 tablet (100 mcg total) by mouth daily before breakfast. -     TSH  Low  serum vitamin B12 -     Vitamin B12  History of non anemic vitamin B12 deficiency -     Vitamin B12 -     CBC  History of anemia -     CBC  Other long term (current) drug therapy -     COMPLETE METABOLIC PANEL WITH GFR  Vasomotor flushing -     FSH/LH  Status post subtotal hysterectomy -     FSH/LH  Mixed hyperlipidemia -     Lipid Panel w/reflex Direct LDL   PHQ9=10, no acute safety issues GAD7=13 Increasing Fluoxetine to 60 mg QD Cont Alprazolam prn for breakthrough anxiety/panic Due for TSH She will reach out to her counselor at [facility]. I have made her aware we are offering virtual therapy appointments through [facility]  Fasting labs pending  Follow-up in 1 month or sooner as needed  Follow Up Instructions:    I discussed the assessment and treatment plan with the patient. The patient was provided an opportunity to ask questions and all were answered. The patient agreed with the plan and demonstrated an understanding of the instructions.   The patient was advised to call back or seek an in-person evaluation if the symptoms worsen or if the condition fails to improve as anticipated.  I provided 15 minutes of non-face-to-face time during this encounter.   Trixie Dredge, [place]

## 2018-09-11 LAB — COMPLETE METABOLIC PANEL WITH GFR
AG Ratio: 1.8 (calc) (ref 1.0–2.5)
ALT: 15 U/L (ref 6–29)
AST: 15 U/L (ref 10–35)
Albumin: 4.5 g/dL (ref 3.6–5.1)
Alkaline phosphatase (APISO): 66 U/L (ref 31–125)
BUN: 14 mg/dL (ref 7–25)
CO2: 27 mmol/L (ref 20–32)
Calcium: 9.7 mg/dL (ref 8.6–10.2)
Chloride: 104 mmol/L (ref 98–110)
Creat: 0.74 mg/dL (ref 0.50–1.10)
GFR, Est African American: 111 mL/min/{1.73_m2} (ref >=60)
GFR, Est Non African American: 96 mL/min/{1.73_m2} (ref >=60)
Globulin: 2.5 g/dL (calc) (ref 1.9–3.7)
Glucose, Bld: 108 mg/dL — ABNORMAL HIGH (ref 65–99)
Potassium: 4.5 mmol/L (ref 3.5–5.3)
Sodium: 140 mmol/L (ref 135–146)
Total Bilirubin: 0.4 mg/dL (ref 0.2–1.2)
Total Protein: 7 g/dL (ref 6.1–8.1)

## 2018-09-11 LAB — CBC
HCT: 38 % (ref 35.0–45.0)
Hemoglobin: 12.8 g/dL (ref 11.7–15.5)
MCH: 31.2 pg (ref 27.0–33.0)
MCHC: 33.7 g/dL (ref 32.0–36.0)
MCV: 92.7 fL (ref 80.0–100.0)
MPV: 12.1 fL (ref 7.5–12.5)
Platelets: 228 10*3/uL (ref 140–400)
RBC: 4.1 10*6/uL (ref 3.80–5.10)
RDW: 12.3 % (ref 11.0–15.0)
WBC: 7.1 10*3/uL (ref 3.8–10.8)

## 2018-09-11 LAB — LIPID PANEL W/REFLEX DIRECT LDL
Cholesterol: 212 mg/dL — ABNORMAL HIGH (ref <200)
HDL: 68 mg/dL (ref >=50)
LDL Cholesterol (Calc): 125 mg/dL (calc) — ABNORMAL HIGH
Non-HDL Cholesterol (Calc): 144 mg/dL (calc) — ABNORMAL HIGH (ref <130)
Total CHOL/HDL Ratio: 3.1 (calc) (ref <5.0)
Triglycerides: 93 mg/dL (ref <150)

## 2018-09-11 LAB — FSH/LH
FSH: 102.2 m[IU]/mL
LH: 47.1 m[IU]/mL

## 2018-09-11 LAB — TSH: TSH: 8.27 mIU/L — ABNORMAL HIGH

## 2018-09-11 LAB — VITAMIN B12: Vitamin B-12: 299 pg/mL (ref 200–1100)

## 2018-09-11 LAB — VITAMIN D 25 HYDROXY (VIT D DEFICIENCY, FRACTURES): Vit D, 25-Hydroxy: 19 ng/mL — ABNORMAL LOW (ref 30–100)

## 2018-09-11 MED ORDER — ERGOCALCIFEROL 1.25 MG (50000 UT) PO CAPS: 50000.0000 [IU] | ORAL_CAPSULE | ORAL | 0 refills | Status: DC

## 2018-09-21 ENCOUNTER — Encounter: Payer: Self-pay | Admitting: Physician Assistant

## 2018-09-21 DIAGNOSIS — R232 Flushing: Secondary | ICD-10-CM | POA: Insufficient documentation

## 2018-09-21 DIAGNOSIS — Z90711 Acquired absence of uterus with remaining cervical stump: Secondary | ICD-10-CM | POA: Insufficient documentation

## 2018-10-22 ENCOUNTER — Ambulatory Visit (INDEPENDENT_AMBULATORY_CARE_PROVIDER_SITE_OTHER): Payer: BC Managed Care – PPO | Admitting: Physician Assistant

## 2018-10-22 ENCOUNTER — Encounter: Payer: Self-pay | Admitting: Physician Assistant

## 2018-10-22 DIAGNOSIS — K219 Gastro-esophageal reflux disease without esophagitis: Secondary | ICD-10-CM

## 2018-10-22 DIAGNOSIS — E559 Vitamin D deficiency, unspecified: Secondary | ICD-10-CM | POA: Diagnosis not present

## 2018-10-22 DIAGNOSIS — M791 Myalgia, unspecified site: Secondary | ICD-10-CM

## 2018-10-22 DIAGNOSIS — R7989 Other specified abnormal findings of blood chemistry: Secondary | ICD-10-CM | POA: Diagnosis not present

## 2018-10-22 DIAGNOSIS — E038 Other specified hypothyroidism: Secondary | ICD-10-CM

## 2018-10-22 DIAGNOSIS — F4310 Post-traumatic stress disorder, unspecified: Secondary | ICD-10-CM

## 2018-10-22 DIAGNOSIS — K449 Diaphragmatic hernia without obstruction or gangrene: Secondary | ICD-10-CM

## 2018-10-22 DIAGNOSIS — E063 Autoimmune thyroiditis: Secondary | ICD-10-CM

## 2018-10-22 MED ORDER — ERGOCALCIFEROL 1.25 MG (50000 UT) PO CAPS: 50000.0000 [IU] | ORAL_CAPSULE | ORAL | 0 refills | Status: AC

## 2018-10-22 MED ORDER — LEVOTHYROXINE SODIUM 100 MCG PO TABS: 100.0000 ug | ORAL_TABLET | Freq: Every day | ORAL | 2 refills | Status: DC

## 2018-10-22 MED ORDER — OMEPRAZOLE 40 MG PO CPDR: 40.0000 mg | DELAYED_RELEASE_CAPSULE | Freq: Every day | ORAL | 0 refills | Status: DC

## 2018-10-22 MED ORDER — LEVOTHYROXINE SODIUM 112 MCG PO TABS: 112.0000 ug | ORAL_TABLET | Freq: Every day | ORAL | 2 refills | Status: DC

## 2018-10-22 MED ORDER — METHOCARBAMOL 750 MG PO TABS: 375.0000 mg | ORAL_TABLET | Freq: Two times a day (BID) | ORAL | 2 refills | Status: DC | PRN

## 2018-10-22 NOTE — Progress Notes (Signed)
Virtual Visit via Video Note  I connected with [name] on 10/22/18 at 11:10 AM EDT by a video enabled telemedicine application and verified that I am speaking with the correct person using two identifiers.   I discussed the limitations of evaluation and management by telemedicine and the availability of in person appointments. The patient expressed understanding and agreed to proceed.  History of Present Illness: HPI:                                                                [name] is a 48 y.o. female   CC: medication refills  Chronic PTSD: at last visit 6 weeks ago Fluoxetine was increased to 60 mg. She states it has made a bit of difference - she feels less irritable, she feels anxiety is more manageable. She has a virtual appt scheduled with her counselor  Stressors - husband/marital stress, work, pandemic, court summons  Hypothyroidism: takes synthroid 100 mcg. Compliant with medication but ran out 3 days ago. Denies chest pain, heart palpitations, tremor, GI symptoms, or skin changes. Reports that she is exercising 6 days per week, combination of bodyweight exercises and aerobic exercise and is unable to lose weight.  Vit D deficiency: started on 50,000 weekly about 5 weeks ago.  Vasomotor symptoms: s/p hysterectomy 2013. Approx 2 months ago began having hot flashes described as "like a match lit inside me." She is having occasional nightsweats and sometimes waking up feeling hot. She would like lab testing for menopause.  GERD/hiatal hernia: taking Pepcid 20 mg in the morning and Omeprazole 20 mg. She tried Nexium and said there was no difference. She states she is regurgitating acid at night and waking up coughing and gasping. She is avoid fried/fatty/spicy foods.  Depression screen [facility] 2/9 10/22/2018 09/08/2018 02/25/2018 05/15/2017 08/20/2016  Decreased Interest 1 1 0 0 2  Down, Depressed, Hopeless 2 1 1  0 1  PHQ - 2 Score 3 2 1  0 3  Altered sleeping 1 2 1   0 2  Tired, decreased energy 3 2 0 1 2  Change in appetite 1 1 0 0 3  Feeling bad or failure about yourself  2 2 2  0 1  Trouble concentrating 2 1 1  0 2  Moving slowly or fidgety/restless 0 0 0 0 3  Suicidal thoughts 0 0 0 0 0  PHQ-9 Score 12 10 5 1 16   Difficult doing work/chores - - Somewhat difficult Not difficult at all -  Some recent data might be hidden    GAD 7 : Generalized Anxiety Score 10/22/2018 09/08/2018 02/25/2018 05/15/2017  Nervous, Anxious, on Edge 2 2 1 1   Control/stop worrying 2 2 3 1   Worry too much - different things 1 2 3 1   Trouble relaxing 1 2 2  0  Restless 1 1 1  0  Easily annoyed or irritable 3 3 2 1   Afraid - awful might happen 2 1 0 0  Total GAD 7 Score 12 13 12 4   Anxiety Difficulty - - Somewhat difficult Not difficult at all      Past Medical History:  Diagnosis Date  . Adenomyosis 2013  . Anemia   . Anemia, iron deficiency 12/23/2011  . Anxiety   . Asthma   . Cancer  of appendix ([place])   . Complication of anesthesia    scoline pain? from use of Succinylcholine   . Concussion syndrome 07/11/2016  . Cough   . Depression   . Endometriosis   . Generalized headaches   . GERD (gastroesophageal reflux disease)   . Heart murmur   . History of sinus surgery 2010   MAXILLARY, ETHMOID, SPHENOID  . Hypothyroidism   . Iron deficiency anemia   . Kidney stones   . Migraine   . Obesity, Class III, BMI 40-49.9 (morbid obesity) ([place])   . SVD (spontaneous vaginal delivery)    x3  . Vitamin D deficiency 02/02/2017   Past Surgical History:  Procedure Laterality Date  . ABDOMINAL HYSTERECTOMY    . CHOLECYSTECTOMY  01/27/2012   Procedure: LAPAROSCOPIC CHOLECYSTECTOMY;  Surgeon: Harl Bowie, MD;  Location: [facility];  Service: General;  Laterality: N/A;  Laparoscopic chole  . COLPOSCOPY  2007  . KNEE SURGERY     right, scope and ACL repair  . LAPAROSCOPIC APPENDECTOMY  01/02/2012   Procedure: APPENDECTOMY LAPAROSCOPIC;  Surgeon: Harl Bowie, MD;   Location: [facility];  Service: General;  Laterality: N/A;  . LAPAROSCOPIC ASSISTED VAGINAL HYSTERECTOMY  11/13/2011   Procedure: LAPAROSCOPIC ASSISTED VAGINAL HYSTERECTOMY;  Surgeon: Darlyn Chamber, MD;  Location: [facility];  Service: Gynecology;  Laterality: N/A;  . LAPAROSCOPY  05/30/2011   Procedure: LAPAROSCOPY OPERATIVE;  Surgeon: Darlyn Chamber, MD;  Location: [facility];  Service: Gynecology;  Laterality: N/A;  YAG  LASER of Endometriosis.  Marland Kitchen NASAL SINUS SURGERY  2010  . PARTIAL COLECTOMY  01/27/2012   Social History   Tobacco Use  . Smoking status: Current Every Day Smoker    Packs/day: 0.75    Years: 1.00    Pack years: 0.75    Types: Cigarettes  . Smokeless tobacco: Never Used  Substance Use Topics  . Alcohol use: No   family history includes Alcohol abuse in her brother and father; Asthma in her mother and sister; Colon cancer in her paternal grandmother; Depression in her brother, mother, sister, and sister; Drug abuse in her brother; Lung cancer in her father; Suicidality in her brother.    ROS: negative except as noted in the HPI  Medications: Current Outpatient Medications  Medication Sig Dispense Refill  . albuterol (VENTOLIN HFA) 108 (90 Base) MCG/ACT inhaler Inhale 1-2 puffs into the lungs every 4 (four) hours as needed for wheezing or shortness of breath. 18 g 3  . ALPRAZolam (XANAX) 0.5 MG tablet Take 0.5-1 tablets (0.25-0.5 mg total) by mouth once as needed for up to 1 dose for anxiety. 20 tablet 2  . ergocalciferol (VITAMIN D2) 1.25 MG (50000 UT) capsule Take 1 capsule (50,000 Units total) by mouth every 30 (thirty) days for 6 doses. 6 capsule 0  . famotidine (PEPCID) 20 MG tablet Take 20 mg by mouth every morning.    Marland Kitchen FLUoxetine (PROZAC) 20 MG capsule Take 3 capsules (60 mg total) by mouth at bedtime. 270 capsule 0  . levothyroxine (SYNTHROID) 112 MCG tablet Take 1 tablet (112 mcg total) by mouth daily before breakfast. 30 tablet 2  . methocarbamol (ROBAXIN) 750 MG tablet  Take 0.5-1 tablets (375-750 mg total) by mouth 2 (two) times daily as needed for muscle spasms. 30 tablet 2  . omeprazole (PRILOSEC) 40 MG capsule Take 1 capsule (40 mg total) by mouth daily. 30 capsule 0  . Probiotic Product (PROBIOTIC PO) Take 1 tablet by mouth daily.  No current facility-administered medications for this visit.    Allergies  Allergen Reactions  . Reglan [Metoclopramide] Other (See Comments)    Caused a dystonic reaction.  . Ciprofloxacin Nausea And Vomiting  . Clarithromycin Hives  . Moxifloxacin Hives       Objective:  LMP 11/02/2011  Gen:  alert, not ill-appearing, no distress, appropriate for age 41: head normocephalic without obvious abnormality, conjunctiva and cornea clear, trachea midline Pulm: Normal work of breathing, normal phonation Neuro: alert and oriented x 3 Psych: cooperative, anxious mood, affect mood-congruent, speech is articulate, normal rate and volume; thought processes clear and goal-directed, normal judgment, good insight   Recent Results (from the past 2160 hour(s))  TSH     Status: Abnormal   Collection Time: 09/10/18 10:59 AM  Result Value Ref Range   TSH 8.27 (H) mIU/L    Comment:           Reference Range .           > or = 20 Years  0.40-4.50 .                Pregnancy Ranges           First trimester    0.26-2.66           Second trimester   0.55-2.73           Third trimester    0.43-2.91   COMPLETE METABOLIC PANEL WITH GFR     Status: Abnormal   Collection Time: 09/10/18 10:59 AM  Result Value Ref Range   Glucose, Bld 108 (H) 65 - 99 mg/dL    Comment: .            Fasting reference interval . For someone without known diabetes, a glucose value between 100 and 125 mg/dL is consistent with prediabetes and should be confirmed with a follow-up test. .    BUN 14 7 - 25 mg/dL   Creat 0.74 0.50 - 1.10 mg/dL   GFR, Est Non African American 96 > OR = 60 mL/min/1.14m2   GFR, Est African American 111 > OR = 60  mL/min/1.49m2   BUN/Creatinine Ratio NOT APPLICABLE 6 - 22 (calc)   Sodium 140 135 - 146 mmol/L   Potassium 4.5 3.5 - 5.3 mmol/L   Chloride 104 98 - 110 mmol/L   CO2 27 20 - 32 mmol/L   Calcium 9.7 8.6 - 10.2 mg/dL   Total Protein 7.0 6.1 - 8.1 g/dL   Albumin 4.5 3.6 - 5.1 g/dL   Globulin 2.5 1.9 - 3.7 g/dL (calc)   AG Ratio 1.8 1.0 - 2.5 (calc)   Total Bilirubin 0.4 0.2 - 1.2 mg/dL   Alkaline phosphatase (APISO) 66 31 - 125 U/L   AST 15 10 - 35 U/L   ALT 15 6 - 29 U/L  Vitamin B12     Status: None   Collection Time: 09/10/18 10:59 AM  Result Value Ref Range   Vitamin B-12 299 200 - 1,100 pg/mL    Comment: . Please Note: Although the reference range for vitamin B12 is 312 180 7093 pg/mL, it has been reported that between 5 and 10% of patients with values between 200 and 400 pg/mL may experience neuropsychiatric and hematologic abnormalities due to occult B12 deficiency; less than 1% of patients with values above 400 pg/mL will have symptoms. Marland Kitchen   VITAMIN D 25 Hydroxy (Vit-D Deficiency, Fractures)     Status: Abnormal   Collection Time: 09/10/18 10:59 AM  Result Value Ref Range   Vit D, 25-Hydroxy 19 (L) 30 - 100 ng/mL    Comment: Vitamin D Status         25-OH Vitamin D: . Deficiency:                    <20 ng/mL Insufficiency:             20 - 29 ng/mL Optimal:                 > or = 30 ng/mL . For 25-OH Vitamin D testing on patients on  D2-supplementation and patients for whom quantitation  of D2 and D3 fractions is required, the [facility](TM) 25-OH VIT D, (D2,D3), LC/MS/MS is recommended: order  code [phone] (patients >33yrs). See Note 1 . Note 1 . For additional information, please refer to  http://education.[facility].com/faq/FAQ199  (This link is being provided for informational/ educational purposes only.)   FSH/LH     Status: None   Collection Time: 09/10/18 10:59 AM  Result Value Ref Range   FSH 102.2 mIU/mL    Comment:                     Reference  Range .              Follicular Phase       2.2-02.5              Mid-cycle Peak         3.1-17.7              Luteal Phase           1.5- 9.1              Postmenopausal       23.0-116.3              .    LH 47.1 mIU/mL    Comment:     Reference Range Follicular Phase  4.2-70.6 Mid-Cycle Peak    8.7-76.3 Luteal Phase      0.5-16.9 Postmenopausal    10.0-54.7   CBC     Status: None   Collection Time: 09/10/18 10:59 AM  Result Value Ref Range   WBC 7.1 3.8 - 10.8 Thousand/uL   RBC 4.10 3.80 - 5.10 Million/uL   Hemoglobin 12.8 11.7 - 15.5 g/dL   HCT 38.0 35.0 - 45.0 %   MCV 92.7 80.0 - 100.0 fL   MCH 31.2 27.0 - 33.0 pg   MCHC 33.7 32.0 - 36.0 g/dL   RDW 12.3 11.0 - 15.0 %   Platelets 228 140 - 400 Thousand/uL   MPV 12.1 7.5 - 12.5 fL  Lipid Panel w/reflex Direct LDL     Status: Abnormal   Collection Time: 09/10/18 10:59 AM  Result Value Ref Range   Cholesterol 212 (H) <200 mg/dL   HDL 68 > OR = 50 mg/dL   Triglycerides 93 <150 mg/dL   LDL Cholesterol (Calc) 125 (H) mg/dL (calc)    Comment: Reference range: <100 . Desirable range <100 mg/dL for primary prevention;   <70 mg/dL for patients with CHD or diabetic patients  with > or = 2 CHD risk factors. Marland Kitchen LDL-C is now calculated using the Martin-Hopkins  calculation, which is a validated novel method providing  better accuracy than the Friedewald equation in the  estimation of LDL-C.  Cresenciano Genre et al. Annamaria Helling. 2376;283(15): 2061-2068  (http://education.[facility].com/faq/FAQ164)    Total CHOL/HDL Ratio 3.1 <5.0 (  calc)   Non-HDL Cholesterol (Calc) 144 (H) <130 mg/dL (calc)    Comment: For patients with diabetes plus 1 major ASCVD risk  factor, treating to a non-HDL-C goal of <100 mg/dL  (LDL-C of <70 mg/dL) is considered a therapeutic  option.      No results found for this or any previous visit (from the past 72 hour(s)). No results found.  Lab Results  Component Value Date   TSH 8.27 (H) 09/10/2018    Lab Results  Component Value Date   CREATININE 0.74 09/10/2018   BUN 14 09/10/2018   NA 140 09/10/2018   K 4.5 09/10/2018   CL 104 09/10/2018   CO2 27 09/10/2018   Lab Results  Component Value Date   ALT 15 09/10/2018   AST 15 09/10/2018   ALKPHOS 70 09/25/2016   BILITOT 0.4 09/10/2018     Assessment and Plan: 48 y.o. female with   .Brittlyn was seen today for medication management.  Diagnoses and all orders for this visit:  Increased thyroid stimulating hormone (TSH) level -     TSH  Hypothyroidism due to Hashimoto's thyroiditis -     Discontinue: levothyroxine (SYNTHROID) 100 MCG tablet; Take 1 tablet (100 mcg total) by mouth daily before breakfast. -     TSH -     levothyroxine (SYNTHROID) 112 MCG tablet; Take 1 tablet (112 mcg total) by mouth daily before breakfast.  Myalgia -     methocarbamol (ROBAXIN) 750 MG tablet; Take 0.5-1 tablets (375-750 mg total) by mouth 2 (two) times daily as needed for muscle spasms.  Vitamin D deficiency -     ergocalciferol (VITAMIN D2) 1.25 MG (50000 UT) capsule; Take 1 capsule (50,000 Units total) by mouth every 30 (thirty) days for 6 doses.  Gastroesophageal reflux disease, esophagitis presence not specified -     omeprazole (PRILOSEC) 40 MG capsule; Take 1 capsule (40 mg total) by mouth daily.  Hiatal hernia  PTSD (post-traumatic stress disorder) with anxiety and depression  Chronic PTSD, Depression PHQ9=12, stable, no acute safety issues GAD7=12 Cont Fluoxetine to 60 mg QD Cont Alprazolam prn for breakthrough anxiety/panic Keep f/u with counselor  Hypothyroidism TSH elevated, endorses trouble losing weight/fatigue Increasing Levo to 112 mcg Recheck TSH in 6 weeks  Vit D deficiency Cont 50kU weekly for 3 more doses, then cont 50k monthly for maintenance Recheck vit d in 3 months  Hiatal hernia/GERD Increase Prilosec to 40 mg QHS x 4 weeks, then resume 20 mg QHS If sx persist, referral to GI to discuss  EGD/hiatal hernia repair  Follow-up in 6 weeks or sooner as needed  Follow Up Instructions:    I discussed the assessment and treatment plan with the patient. The patient was provided an opportunity to ask questions and all were answered. The patient agreed with the plan and demonstrated an understanding of the instructions.   The patient was advised to call back or seek an in-person evaluation if the symptoms worsen or if the condition fails to improve as anticipated.  I provided 15 minutes of non-face-to-face time during this encounter.   Trixie Dredge, [place]

## 2018-11-17 ENCOUNTER — Other Ambulatory Visit: Payer: Self-pay

## 2018-11-17 ENCOUNTER — Ambulatory Visit (INDEPENDENT_AMBULATORY_CARE_PROVIDER_SITE_OTHER): Payer: BC Managed Care – PPO | Admitting: Physician Assistant

## 2018-11-17 VITALS — BP 128/82 | HR 77 | Temp 98.3°F | Wt 207.0 lb

## 2018-11-17 DIAGNOSIS — L02414 Cutaneous abscess of left upper limb: Secondary | ICD-10-CM

## 2018-11-17 DIAGNOSIS — R55 Syncope and collapse: Secondary | ICD-10-CM | POA: Diagnosis not present

## 2018-11-17 DIAGNOSIS — R110 Nausea: Secondary | ICD-10-CM | POA: Diagnosis not present

## 2018-11-17 MED ORDER — ONDANSETRON 4 MG PO TBDP: 4.0000 mg | ORAL_TABLET | Freq: Three times a day (TID) | ORAL | 0 refills | Status: DC | PRN

## 2018-11-17 MED ORDER — HYDROCODONE-ACETAMINOPHEN 5-325 MG PO TABS: 1.0000 | ORAL_TABLET | Freq: Four times a day (QID) | ORAL | 0 refills | Status: AC | PRN

## 2018-11-17 MED ORDER — DOXYCYCLINE HYCLATE 100 MG PO TABS: 100.0000 mg | ORAL_TABLET | Freq: Two times a day (BID) | ORAL | 0 refills | Status: AC

## 2018-11-17 NOTE — Patient Instructions (Signed)
Incision and Drainage, Care After This sheet gives you information about how to care for yourself after your procedure. Your health care provider may also give you more specific instructions. If you have problems or questions, contact your health care provider. What can I expect after the procedure? After the procedure, it is common to have:  Pain or discomfort around the incision site.  Blood, fluid, or pus (drainage) from the incision.  Redness and firm skin around the incision site. Follow these instructions at home: Medicines  Take over-the-counter and prescription medicines only as told by your health care provider.  If you were prescribed an antibiotic medicine, use or take it as told by your health care provider. Do not stop using the antibiotic even if you start to feel better. Wound care Follow instructions from your health care provider about how to take care of your wound. Make sure you:  Wash your hands with soap and water before and after you change your bandage (dressing). If soap and water are not available, use hand sanitizer.  Change your dressing and packing as told by your health care provider. ? If your dressing is dry or stuck when you try to remove it, moisten or wet the dressing with saline or water so that it can be removed without harming your skin or tissues. ? If your wound is packed, leave it in place until your health care provider tells you to remove it. To remove the packing, moisten or wet the packing with saline or water so that it can be removed without harming your skin or tissues.  Leave stitches (sutures), skin glue, or adhesive strips in place. These skin closures may need to stay in place for 2 weeks or longer. If adhesive strip edges start to loosen and curl up, you may trim the loose edges. Do not remove adhesive strips completely unless your health care provider tells you to do that. Check your wound every day for signs of infection. Check for:   More redness, swelling, or pain.  More fluid or blood.  Warmth.  Pus or a bad smell. If you were sent home with a drain tube in place, follow instructions from your health care provider about:  How to empty it.  How to care for it at home.  General instructions  Rest the affected area.  Do not take baths, swim, or use a hot tub until your health care provider approves. Ask your health care provider if you may take showers. You may only be allowed to take sponge baths.  Return to your normal activities as told by your health care provider. Ask your health care provider what activities are safe for you. Your health care provider may put you on activity or lifting restrictions.  The incision will continue to drain. It is normal to have some clear or slightly bloody drainage. The amount of drainage should lessen each day.  Do not apply any creams, ointments, or liquids unless you have been told to by your health care provider.  Keep all follow-up visits as told by your health care provider. This is important. Contact a health care provider if:  Your cyst or abscess returns.  You have a fever or chills.  You have more redness, swelling, or pain around your incision.  You have more fluid or blood coming from your incision.  Your incision feels warm to the touch.  You have pus or a bad smell coming from your incision.  You have red streaks  above or below the incision site. Get help right away if:  You have severe pain or bleeding.  You cannot eat or drink without vomiting.  You have decreased urine output.  You become short of breath.  You have chest pain.  You cough up blood.  The affected area becomes numb or starts to tingle. These symptoms may represent a serious problem that is an emergency. Do not wait to see if the symptoms will go away. Get medical help right away. Call your local emergency services (911 in the [place] Do not drive yourself to the hospital.  Summary  After this procedure, it is common to have fluid, blood, or pus coming from the surgery site.  Follow all home care instructions. You will be told how to take care of your incision, how to check for infection, and how to take medicines.  If you were prescribed an antibiotic medicine, take it as told by your health care provider. Do not stop taking the antibiotic even if you start to feel better.  Contact a health care provider if you have increased redness, swelling, or pain around your incision. Get help right away if you have chest pain, you vomit, you cough up blood, or you have shortness of breath.  Keep all follow-up visits as told by your health care provider. This is important. This information is not intended to replace advice given to you by your health care provider. Make sure you discuss any questions you have with your health care provider. Document Released: 06/24/2011 Document Revised: 03/02/2018 Document Reviewed: 03/02/2018 Elsevier Patient Education  2020 [employer].   Skin Abscess  A skin abscess is an infected area on or under your skin that contains a collection of pus and other material. An abscess may also be called a furuncle, carbuncle, or boil. An abscess can occur in or on almost any part of your body. Some abscesses break open (rupture) on their own. Most continue to get worse unless they are treated. The infection can spread deeper into the body and eventually into your blood, which can make you feel ill. Treatment usually involves draining the abscess. What are the causes? An abscess occurs when germs, like bacteria, pass through your skin and cause an infection. This may be caused by:  A scrape or cut on your skin.  A puncture wound through your skin, including a needle injection or insect bite.  Blocked oil or sweat glands.  Blocked and infected hair follicles.  A cyst that forms beneath your skin (sebaceous cyst) and becomes infected. What  increases the risk? This condition is more likely to develop in people who:  Have a weak body defense system (immune system).  Have diabetes.  Have dry and irritated skin.  Get frequent injections or use illegal IV drugs.  Have a foreign body in a wound, such as a splinter.  Have problems with their lymph system or veins. What are the signs or symptoms? Symptoms of this condition include:  A painful, firm bump under the skin.  A bump with pus at the top. This may break through the skin and drain. Other symptoms include:  Redness surrounding the abscess site.  Warmth.  Swelling of the lymph nodes (glands) near the abscess.  Tenderness.  A sore on the skin. How is this diagnosed? This condition may be diagnosed based on:  A physical exam.  Your medical history.  A sample of pus. This may be used to find out what is causing  the infection.  Blood tests.  Imaging tests, such as an ultrasound, CT scan, or MRI. How is this treated? A small abscess that drains on its own may not need treatment. Treatment for larger abscesses may include:  Moist heat or heat pack applied to the area several times a day.  A procedure to drain the abscess (incision and drainage).  Antibiotic medicines. For a severe abscess, you may first get antibiotics through an IV and then change to antibiotics by mouth. Follow these instructions at home: Medicines   Take over-the-counter and prescription medicines only as told by your health care provider.  If you were prescribed an antibiotic medicine, take it as told by your health care provider. Do not stop taking the antibiotic even if you start to feel better. Abscess care   If you have an abscess that has not drained, apply heat to the affected area. Use the heat source that your health care provider recommends, such as a moist heat pack or a heating pad. ? Place a towel between your skin and the heat source. ? Leave the heat on for 20-30  minutes. ? Remove the heat if your skin turns bright red. This is especially important if you are unable to feel pain, heat, or cold. You may have a greater risk of getting burned.  Follow instructions from your health care provider about how to take care of your abscess. Make sure you: ? Cover the abscess with a bandage (dressing). ? Change your dressing or gauze as told by your health care provider. ? Wash your hands with soap and water before you change the dressing or gauze. If soap and water are not available, use hand sanitizer.  Check your abscess every day for signs of a worsening infection. Check for: ? More redness, swelling, or pain. ? More fluid or blood. ? Warmth. ? More pus or a bad smell. General instructions  To avoid spreading the infection: ? Do not share personal care items, towels, or hot tubs with others. ? Avoid making skin contact with other people.  Keep all follow-up visits as told by your health care provider. This is important. Contact a health care provider if you have:  More redness, swelling, or pain around your abscess.  More fluid or blood coming from your abscess.  Warm skin around your abscess.  More pus or a bad smell coming from your abscess.  A fever.  Muscle aches.  Chills or a general ill feeling. Get help right away if you:  Have severe pain.  See red streaks on your skin spreading away from the abscess. Summary  A skin abscess is an infected area on or under your skin that contains a collection of pus and other material.  A small abscess that drains on its own may not need treatment.  Treatment for larger abscesses may include having a procedure to drain the abscess and taking an antibiotic. This information is not intended to replace advice given to you by your health care provider. Make sure you discuss any questions you have with your health care provider. Document Released: 01/09/2005 Document Revised: 07/23/2018 Document  Reviewed: 05/15/2017 Elsevier Patient Education  2020 [employer].

## 2018-11-17 NOTE — Progress Notes (Signed)
HPI:                                                                [name] is a 48 y.o. female who presents to [facility]: [facility] today for abscess of left arm  Patient reports pain, swelling and redness of left underarm for approx 2 days. She has been applying warm compresses and reports there has been spontaneously purulent drainage and redness has improved. Denies fever, chills, malaise.     Past Medical History:  Diagnosis Date  . Adenomyosis 2013  . Anemia   . Anemia, iron deficiency 12/23/2011  . Anxiety   . Asthma   . Cancer of appendix ([place])   . Complication of anesthesia    scoline pain? from use of Succinylcholine   . Concussion syndrome 07/11/2016  . Cough   . Depression   . Endometriosis   . Generalized headaches   . GERD (gastroesophageal reflux disease)   . Heart murmur   . History of sinus surgery 2010   MAXILLARY, ETHMOID, SPHENOID  . Hypothyroidism   . Iron deficiency anemia   . Kidney stones   . Migraine   . Obesity, Class III, BMI 40-49.9 (morbid obesity) ([place])   . SVD (spontaneous vaginal delivery)    x3  . Vitamin D deficiency 02/02/2017   Past Surgical History:  Procedure Laterality Date  . ABDOMINAL HYSTERECTOMY    . CHOLECYSTECTOMY  01/27/2012   Procedure: LAPAROSCOPIC CHOLECYSTECTOMY;  Surgeon: Harl Bowie, MD;  Location: [facility];  Service: General;  Laterality: N/A;  Laparoscopic chole  . COLPOSCOPY  2007  . KNEE SURGERY     right, scope and ACL repair  . LAPAROSCOPIC APPENDECTOMY  01/02/2012   Procedure: APPENDECTOMY LAPAROSCOPIC;  Surgeon: Harl Bowie, MD;  Location: [facility];  Service: General;  Laterality: N/A;  . LAPAROSCOPIC ASSISTED VAGINAL HYSTERECTOMY  11/13/2011   Procedure: LAPAROSCOPIC ASSISTED VAGINAL HYSTERECTOMY;  Surgeon: Darlyn Chamber, MD;  Location: [facility];  Service: Gynecology;  Laterality: N/A;  . LAPAROSCOPY  05/30/2011   Procedure: LAPAROSCOPY OPERATIVE;   Surgeon: Darlyn Chamber, MD;  Location: [facility];  Service: Gynecology;  Laterality: N/A;  YAG  LASER of Endometriosis.  Marland Kitchen NASAL SINUS SURGERY  2010  . PARTIAL COLECTOMY  01/27/2012   Social History   Tobacco Use  . Smoking status: Current Every Day Smoker    Packs/day: 0.75    Years: 1.00    Pack years: 0.75    Types: Cigarettes  . Smokeless tobacco: Never Used  Substance Use Topics  . Alcohol use: No   family history includes Alcohol abuse in her brother and father; Asthma in her mother and sister; Colon cancer in her paternal grandmother; Depression in her brother, mother, sister, and sister; Drug abuse in her brother; Lung cancer in her father; Suicidality in her brother.    ROS: negative except as noted in the HPI  Medications: Current Outpatient Medications  Medication Sig Dispense Refill  . albuterol (VENTOLIN HFA) 108 (90 Base) MCG/ACT inhaler Inhale 1-2 puffs into the lungs every 4 (four) hours as needed for wheezing or shortness of breath. 18 g 3  . ALPRAZolam (XANAX) 0.5 MG tablet Take 0.5-1 tablets (0.25-0.5 mg total)  by mouth once as needed for up to 1 dose for anxiety. 20 tablet 2  . ergocalciferol (VITAMIN D2) 1.25 MG (50000 UT) capsule Take 1 capsule (50,000 Units total) by mouth every 30 (thirty) days for 6 doses. 6 capsule 0  . famotidine (PEPCID) 20 MG tablet Take 20 mg by mouth every morning.    Marland Kitchen FLUoxetine (PROZAC) 20 MG capsule Take 3 capsules (60 mg total) by mouth at bedtime. 270 capsule 0  . levothyroxine (SYNTHROID) 112 MCG tablet Take 1 tablet (112 mcg total) by mouth daily before breakfast. 30 tablet 2  . methocarbamol (ROBAXIN) 750 MG tablet Take 0.5-1 tablets (375-750 mg total) by mouth 2 (two) times daily as needed for muscle spasms. 30 tablet 2  . omeprazole (PRILOSEC) 40 MG capsule Take 1 capsule (40 mg total) by mouth daily. 30 capsule 0  . Probiotic Product (PROBIOTIC PO) Take 1 tablet by mouth daily.     Marland Kitchen doxycycline (VIBRA-TABS) 100 MG tablet Take  1 tablet (100 mg total) by mouth 2 (two) times daily for 7 days. 14 tablet 0  . ondansetron (ZOFRAN-ODT) 4 MG disintegrating tablet Take 1 tablet (4 mg total) by mouth every 8 (eight) hours as needed for nausea or vomiting. 10 tablet 0  . promethazine (PHENERGAN) 12.5 MG tablet Take 1 tablet (12.5 mg total) by mouth every 8 (eight) hours as needed for nausea or vomiting. 20 tablet 0   No current facility-administered medications for this visit.    Allergies  Allergen Reactions  . Reglan [Metoclopramide] Other (See Comments)    Caused a dystonic reaction.  . Ciprofloxacin Nausea And Vomiting  . Clarithromycin Hives  . Moxifloxacin Hives       Objective:  BP 128/82   Pulse 77   Temp 98.3 F (36.8 C) (Oral)   Wt 207 lb (93.9 kg)   LMP 11/02/2011   BMI 35.53 kg/m  Gen:  alert, not ill-appearing, no distress, appropriate for age 25: head normocephalic without obvious abnormality, conjunctiva and cornea clear, trachea midline Pulm: Normal work of breathing, normal phonation  Neuro: alert and oriented x 3, no tremor MSK: extremities atraumatic, normal gait and station Skin: left proximal underarm there is an approx 1.5 cm abscess with approx 3 cm area of surrounding redness and induration    Procedure:  Incision and drainage of abscess. Risks, benefits, and alternatives explained and verbal consent obtained. Surface cleaned with chlorhexidine 8 cc lidocaine 1% with epinephine infiltrated around abscess. Adequate anesthesia ensured. Area prepped and draped in a sterile fashion. #11 blade used to make a stab incision into abscess. Purulence expressed with pressure. Hemostasis achieved. Patient tolerated procedure. She had a mild vasovagal episode without syncope during local anesthesia    Assessment and Plan: 48 y.o. female with   .Diagnoses and all orders for this visit:  Abscess of left arm -     doxycycline (VIBRA-TABS) 100 MG tablet; Take 1 tablet (100 mg total)  by mouth 2 (two) times daily for 7 days. -     HYDROcodone-acetaminophen (NORCO/VICODIN) 5-325 MG tablet; Take 1 tablet by mouth every 6 (six) hours as needed for up to 3 days for moderate pain or severe pain.  Vasovagal episode -     ondansetron (ZOFRAN-ODT) 4 MG disintegrating tablet; Take 1 tablet (4 mg total) by mouth every 8 (eight) hours as needed for nausea or vomiting. -     ondansetron (ZOFRAN) tablet 4 mg  Nausea without vomiting -     ondansetron (  ZOFRAN-ODT) 4 MG disintegrating tablet; Take 1 tablet (4 mg total) by mouth every 8 (eight) hours as needed for nausea or vomiting. -     ondansetron (ZOFRAN) tablet 4 mg   I&D performed in office today, see above Patient had a mild vasovagal response without syncope during field block with lidocaine She was treated supportively with anti-emetic and placement in trendelenburg w/ ice pack and recovered fully Doxycycline to cover for MRSA cellulitis Counseled on wound care  Patient education and anticipatory guidance given Patient agrees with treatment plan Follow-up in 2 days or sooner as needed if symptoms worsen or fail to improve  Darlyne Russian PA-C

## 2018-11-18 DIAGNOSIS — R110 Nausea: Secondary | ICD-10-CM | POA: Diagnosis not present

## 2018-11-18 DIAGNOSIS — R55 Syncope and collapse: Secondary | ICD-10-CM | POA: Diagnosis not present

## 2018-11-18 MED ORDER — ONDANSETRON HCL 4 MG PO TABS
4.0000 mg | ORAL_TABLET | Freq: Once | ORAL | Status: AC
  Administered 2018-11-18: 4 mg via ORAL

## 2018-11-19 ENCOUNTER — Ambulatory Visit (INDEPENDENT_AMBULATORY_CARE_PROVIDER_SITE_OTHER): Payer: BC Managed Care – PPO | Admitting: Physician Assistant

## 2018-11-19 ENCOUNTER — Encounter: Payer: Self-pay | Admitting: Physician Assistant

## 2018-11-19 ENCOUNTER — Other Ambulatory Visit: Payer: Self-pay

## 2018-11-19 VITALS — BP 114/80 | HR 68 | Temp 98.2°F

## 2018-11-19 DIAGNOSIS — R112 Nausea with vomiting, unspecified: Secondary | ICD-10-CM | POA: Diagnosis not present

## 2018-11-19 DIAGNOSIS — T50905A Adverse effect of unspecified drugs, medicaments and biological substances, initial encounter: Secondary | ICD-10-CM

## 2018-11-19 DIAGNOSIS — L02419 Cutaneous abscess of limb, unspecified: Secondary | ICD-10-CM

## 2018-11-19 DIAGNOSIS — L03119 Cellulitis of unspecified part of limb: Secondary | ICD-10-CM

## 2018-11-19 MED ORDER — PROMETHAZINE HCL 12.5 MG PO TABS: 12.5000 mg | ORAL_TABLET | Freq: Three times a day (TID) | ORAL | 0 refills | Status: DC | PRN

## 2018-11-19 NOTE — Progress Notes (Signed)
HPI:                                                                [name] is a 48 y.o. female who presents to [facility] today for I&D follow-up  Underwent I&D of abscess of left proximal upper extremity in office 2 days ago. Reports significant improvement in her symptoms. States there was some clear drainage throughout the day yesterday. No bleeding or soaking bandages. No fever or streaking redness.  She is having nausea for about 90 minutes after taking the Doxycycline. Taking with food and zofran do not help.  Past Medical History:  Diagnosis Date  . Adenomyosis 2013  . Anemia   . Anemia, iron deficiency 12/23/2011  . Anxiety   . Asthma   . Cancer of appendix ([place])   . Complication of anesthesia    scoline pain? from use of Succinylcholine   . Concussion syndrome 07/11/2016  . Cough   . Depression   . Endometriosis   . Generalized headaches   . GERD (gastroesophageal reflux disease)   . Heart murmur   . History of sinus surgery 2010   MAXILLARY, ETHMOID, SPHENOID  . Hypothyroidism   . Iron deficiency anemia   . Kidney stones   . Migraine   . Obesity, Class III, BMI 40-49.9 (morbid obesity) ([place])   . SVD (spontaneous vaginal delivery)    x3  . Vitamin D deficiency 02/02/2017   Past Surgical History:  Procedure Laterality Date  . ABDOMINAL HYSTERECTOMY    . CHOLECYSTECTOMY  01/27/2012   Procedure: LAPAROSCOPIC CHOLECYSTECTOMY;  Surgeon: Harl Bowie, MD;  Location: [facility];  Service: General;  Laterality: N/A;  Laparoscopic chole  . COLPOSCOPY  2007  . KNEE SURGERY     right, scope and ACL repair  . LAPAROSCOPIC APPENDECTOMY  01/02/2012   Procedure: APPENDECTOMY LAPAROSCOPIC;  Surgeon: Harl Bowie, MD;  [facility];  Service: General;  Laterality: N/A;  . LAPAROSCOPIC ASSISTED VAGINAL HYSTERECTOMY  11/13/2011   Procedure: LAPAROSCOPIC ASSISTED VAGINAL HYSTERECTOMY;  Surgeon: Darlyn Chamber,  MD;  Location: [facility];  Service: Gynecology;  Laterality: N/A;  . LAPAROSCOPY  05/30/2011   Procedure: LAPAROSCOPY OPERATIVE;  Surgeon: Darlyn Chamber, MD;  Location: [facility];  Service: Gynecology;  Laterality: N/A;  YAG  LASER of Endometriosis.  Marland Kitchen NASAL SINUS SURGERY  2010  . PARTIAL COLECTOMY  01/27/2012   Social History   Tobacco Use  . Smoking status: Current Every Day Smoker    Packs/day: 0.75    Years: 1.00    Pack years: 0.75    Types: Cigarettes  . Smokeless tobacco: Never Used  Substance Use Topics  . Alcohol use: No   family history includes Alcohol abuse in her brother and father; Asthma in her mother and sister; Colon cancer in her paternal grandmother; Depression in her brother, mother, sister, and sister; Drug abuse in her brother; Lung cancer in her father; Suicidality in her brother.    ROS: negative except as noted in the HPI  Medications: Current Outpatient Medications  Medication Sig Dispense Refill  . albuterol (VENTOLIN HFA) 108 (90 Base) MCG/ACT inhaler Inhale 1-2 puffs into the lungs every 4 (four) hours as needed for  wheezing or shortness of breath. 18 g 3  . ALPRAZolam (XANAX) 0.5 MG tablet Take 0.5-1 tablets (0.25-0.5 mg total) by mouth once as needed for up to 1 dose for anxiety. 20 tablet 2  . doxycycline (VIBRA-TABS) 100 MG tablet Take 1 tablet (100 mg total) by mouth 2 (two) times daily for 7 days. 14 tablet 0  . ergocalciferol (VITAMIN D2) 1.25 MG (50000 UT) capsule Take 1 capsule (50,000 Units total) by mouth every 30 (thirty) days for 6 doses. 6 capsule 0  . famotidine (PEPCID) 20 MG tablet Take 20 mg by mouth every morning.    Marland Kitchen FLUoxetine (PROZAC) 20 MG capsule Take 3 capsules (60 mg total) by mouth at bedtime. 270 capsule 0  . HYDROcodone-acetaminophen (NORCO/VICODIN) 5-325 MG tablet Take 1 tablet by mouth every 6 (six) hours as needed for up to 3 days for moderate pain or severe pain. 12 tablet 0  . levothyroxine (SYNTHROID) 112 MCG tablet Take 1  tablet (112 mcg total) by mouth daily before breakfast. 30 tablet 2  . methocarbamol (ROBAXIN) 750 MG tablet Take 0.5-1 tablets (375-750 mg total) by mouth 2 (two) times daily as needed for muscle spasms. 30 tablet 2  . omeprazole (PRILOSEC) 40 MG capsule Take 1 capsule (40 mg total) by mouth daily. 30 capsule 0  . ondansetron (ZOFRAN-ODT) 4 MG disintegrating tablet Take 1 tablet (4 mg total) by mouth every 8 (eight) hours as needed for nausea or vomiting. 10 tablet 0  . Probiotic Product (PROBIOTIC PO) Take 1 tablet by mouth daily.      No current facility-administered medications for this visit.    Allergies  Allergen Reactions  . Reglan [Metoclopramide] Other (See Comments)    Caused a dystonic reaction.  . Ciprofloxacin Nausea And Vomiting  . Clarithromycin Hives  . Moxifloxacin Hives       Objective:  BP 114/80   Pulse 68   Temp 98.2 F (36.8 C) (Oral)   LMP 11/02/2011  Physical Exam Constitutional:      Appearance: Normal appearance. She is not ill-appearing.  Pulmonary:     Effort: Pulmonary effort is normal.  Skin:      Neurological:     Mental Status: She is alert.      No results found for this or any previous visit (from the past 72 hour(s)). No results found.    Assessment and Plan: 48 y.o. female with   .Clarabel was seen today for follow-up.  Diagnoses and all orders for this visit:  Cellulitis and abscess of upper extremity  Drug-induced nausea and vomiting -     promethazine (PHENERGAN) 12.5 MG tablet; Take 1 tablet (12.5 mg total) by mouth every 8 (eight) hours as needed for nausea or vomiting.  Afebrile, no tachycardia, not ill appearing Interval improvement of cellulitis with resolution of abscess Continue Doxycycline to treat cellulitis for full 7 days Phenergan prn for nausea   Patient education and anticipatory guidance given Patient agrees with treatment plan Follow-up as needed if symptoms worsen or fail to improve  Darlyne Russian PA-C

## 2018-11-19 NOTE — Patient Instructions (Signed)

## 2018-11-22 ENCOUNTER — Encounter: Payer: Self-pay | Admitting: Physician Assistant

## 2018-12-03 ENCOUNTER — Encounter: Payer: Self-pay | Admitting: Physician Assistant

## 2018-12-03 ENCOUNTER — Ambulatory Visit (INDEPENDENT_AMBULATORY_CARE_PROVIDER_SITE_OTHER): Payer: BC Managed Care – PPO | Admitting: Physician Assistant

## 2018-12-03 ENCOUNTER — Other Ambulatory Visit: Payer: Self-pay

## 2018-12-03 DIAGNOSIS — F4310 Post-traumatic stress disorder, unspecified: Secondary | ICD-10-CM | POA: Diagnosis not present

## 2018-12-03 DIAGNOSIS — K219 Gastro-esophageal reflux disease without esophagitis: Secondary | ICD-10-CM | POA: Diagnosis not present

## 2018-12-03 MED ORDER — OMEPRAZOLE 20 MG PO CPDR: 20.0000 mg | DELAYED_RELEASE_CAPSULE | Freq: Every day | ORAL | 1 refills | Status: DC

## 2018-12-03 MED ORDER — FLUOXETINE HCL 20 MG PO CAPS: 60.0000 mg | ORAL_CAPSULE | Freq: Every day | ORAL | 1 refills | Status: DC

## 2018-12-03 NOTE — Progress Notes (Signed)
HPI:                                                                [name] is a 48 y.o. female who presents to [facility]: [facility] today for anxiety/thyroid follow-up  Chronic PTSD: 3 months ago Fluoxetine was increased to 60 mg. She states it has helped with her depression, but anxiety is still present.  She has not been in touch with her counselor. She is reading more, playing games to distract herself, and avoiding social media. Stressors - husband/marital stress, shared custody of 71 yo son, work, pandemic,   Hypothyroidism: Levothyroxine increased to 112 mcg approx 6 weeks ago due to fatigue, constipation and difficulty losing weight. Compliant with medication. She has lost about 4 lb. Denies chest pain, heart palpitations, tremor, GI symptoms, or skin changes.   GERD/hiatal hernia: namely c/o regurgitation and waking up coughing/gasping feeling like she aspirated. Omeprazole was increased to 40 mg on 10/22/18. She states she noticed an improvement within 2 days. No longer needs Pepcid. She has continued on 40 mg dose and has not tried reducing back to 20 mg.  Depression screen [facility] 2/9 12/03/2018 10/22/2018 09/08/2018 02/25/2018 05/15/2017  Decreased Interest 1 1 1  0 0  Down, Depressed, Hopeless 1 2 1 1  0  PHQ - 2 Score 2 3 2 1  0  Altered sleeping 1 1 2 1  0  Tired, decreased energy 1 3 2  0 1  Change in appetite 0 1 1 0 0  Feeling bad or failure about yourself  0 2 2 2  0  Trouble concentrating 0 2 1 1  0  Moving slowly or fidgety/restless 0 0 0 0 0  Suicidal thoughts 0 0 0 0 0  PHQ-9 Score 4 12 10 5 1   Difficult doing work/chores Somewhat difficult - - Somewhat difficult Not difficult at all  Some recent data might be hidden    GAD 7 : Generalized Anxiety Score 12/03/2018 10/22/2018 09/08/2018 02/25/2018  Nervous, Anxious, on Edge 2 2 2 1   Control/stop worrying 2 2 2 3   Worry too much - different things 2 1 2 3   Trouble relaxing 1 1 2  2   Restless 1 1 1 1   Easily annoyed or irritable 2 3 3 2   Afraid - awful might happen 2 2 1  0  Total GAD 7 Score 12 12 13 12   Anxiety Difficulty Somewhat difficult - - Somewhat difficult      Past Medical History:  Diagnosis Date  . Adenomyosis 2013  . Anemia   . Anemia, iron deficiency 12/23/2011  . Anxiety   . Asthma   . Cancer of appendix ([place])   . Complication of anesthesia    scoline pain? from use of Succinylcholine   . Concussion syndrome 07/11/2016  . Cough   . Depression   . Endometriosis   . Generalized headaches   . GERD (gastroesophageal reflux disease)   . Heart murmur   . History of sinus surgery 2010   MAXILLARY, ETHMOID, SPHENOID  . Hypothyroidism   . Iron deficiency anemia   . Kidney stones   . Migraine   . Obesity, Class III, BMI 40-49.9 (morbid obesity) ([place])   . SVD (spontaneous vaginal delivery)  x3  . Vitamin D deficiency 02/02/2017   Past Surgical History:  Procedure Laterality Date  . ABDOMINAL HYSTERECTOMY    . CHOLECYSTECTOMY  01/27/2012   Procedure: LAPAROSCOPIC CHOLECYSTECTOMY;  Surgeon: Harl Bowie, MD;  Location: [facility];  Service: General;  Laterality: N/A;  Laparoscopic chole  . COLPOSCOPY  2007  . KNEE SURGERY     right, scope and ACL repair  . LAPAROSCOPIC APPENDECTOMY  01/02/2012   Procedure: APPENDECTOMY LAPAROSCOPIC;  Surgeon: Harl Bowie, MD;  [facility];  Service: General;  Laterality: N/A;  . LAPAROSCOPIC ASSISTED VAGINAL HYSTERECTOMY  11/13/2011   Procedure: LAPAROSCOPIC ASSISTED VAGINAL HYSTERECTOMY;  Surgeon: Darlyn Chamber, MD;  Location: [facility];  Service: Gynecology;  Laterality: N/A;  . LAPAROSCOPY  05/30/2011   Procedure: LAPAROSCOPY OPERATIVE;  Surgeon: Darlyn Chamber, MD;  Location: [facility];  Service: Gynecology;  Laterality: N/A;  YAG  LASER of Endometriosis.  Marland Kitchen NASAL SINUS SURGERY  2010  . PARTIAL COLECTOMY  01/27/2012   Social History   Tobacco Use  . Smoking status: Current Every Day Smoker     Packs/day: 0.75    Years: 1.00    Pack years: 0.75    Types: Cigarettes  . Smokeless tobacco: Never Used  Substance Use Topics  . Alcohol use: No   family history includes Alcohol abuse in her brother and father; Asthma in her mother and sister; Colon cancer in her paternal grandmother; Depression in her brother, mother, sister, and sister; Drug abuse in her brother; Lung cancer in her father; Suicidality in her brother.    ROS: negative except as noted in the HPI  Medications: Current Outpatient Medications  Medication Sig Dispense Refill  . albuterol (VENTOLIN HFA) 108 (90 Base) MCG/ACT inhaler Inhale 1-2 puffs into the lungs every 4 (four) hours as needed for wheezing or shortness of breath. 18 g 3  . ALPRAZolam (XANAX) 0.5 MG tablet Take 0.5-1 tablets (0.25-0.5 mg total) by mouth once as needed for up to 1 dose for anxiety. 20 tablet 2  . ergocalciferol (VITAMIN D2) 1.25 MG (50000 UT) capsule Take 1 capsule (50,000 Units total) by mouth every 30 (thirty) days for 6 doses. 6 capsule 0  . FLUoxetine (PROZAC) 20 MG capsule Take 3 capsules (60 mg total) by mouth at bedtime. 270 capsule 1  . levothyroxine (SYNTHROID) 112 MCG tablet Take 1 tablet (112 mcg total) by mouth daily before breakfast. 30 tablet 2  . methocarbamol (ROBAXIN) 750 MG tablet Take 0.5-1 tablets (375-750 mg total) by mouth 2 (two) times daily as needed for muscle spasms. 30 tablet 2  . omeprazole (PRILOSEC) 20 MG capsule Take 1-2 capsules (20-40 mg total) by mouth daily. 180 capsule 1  . ondansetron (ZOFRAN-ODT) 4 MG disintegrating tablet Take 1 tablet (4 mg total) by mouth every 8 (eight) hours as needed for nausea or vomiting. 10 tablet 0  . Probiotic Product (PROBIOTIC PO) Take 1 tablet by mouth daily.     . promethazine (PHENERGAN) 12.5 MG tablet Take 1 tablet (12.5 mg total) by mouth every 8 (eight) hours as needed for nausea or vomiting. 20 tablet 0   No current facility-administered medications for this visit.     Allergies  Allergen Reactions  . Reglan [Metoclopramide] Other (See Comments)    Caused a dystonic reaction.  . Ciprofloxacin Nausea And Vomiting  . Clarithromycin Hives  . Moxifloxacin Hives       Objective:  BP 120/78   Pulse 65   Temp  98.2 F (36.8 C) (Oral)   Wt 204 lb 14.4 oz (92.9 kg)   LMP 11/02/2011   SpO2 94%   BMI 35.17 kg/m  Gen:  alert, not ill-appearing, no distress, appropriate for age 33: head normocephalic without obvious abnormality, conjunctiva and cornea clear, trachea midline Pulm: Normal work of breathing, normal phonation, clear to auscultation bilaterally, no wheezes, rales or rhonchi CV: Normal rate, regular rhythm, s1 and s2 distinct, no murmurs, clicks or rubs  Neuro: alert and oriented x 3, no tremor MSK: extremities atraumatic, normal gait and station Skin: intact, no rashes on exposed skin, no jaundice, no cyanosis Psych: well-groomed, cooperative, good eye contact, euthymic mood, affect mood-congruent, speech is articulate, and thought processes clear and goal-directed  Lab Results  Component Value Date   CREATININE 0.74 09/10/2018   BUN 14 09/10/2018   NA 140 09/10/2018   K 4.5 09/10/2018   CL 104 09/10/2018   CO2 27 09/10/2018   Lab Results  Component Value Date   ALT 15 09/10/2018   AST 15 09/10/2018   ALKPHOS 70 09/25/2016   BILITOT 0.4 09/10/2018   Lab Results  Component Value Date   WBC 7.1 09/10/2018   HGB 12.8 09/10/2018   HCT 38.0 09/10/2018   MCV 92.7 09/10/2018   PLT 228 09/10/2018   Lab Results  Component Value Date   CHOL 212 (H) 09/10/2018   HDL 68 09/10/2018   LDLCALC 125 (H) 09/10/2018   TRIG 93 09/10/2018   CHOLHDL 3.1 09/10/2018    Lab Results  Component Value Date   TSH 8.27 (H) 09/10/2018     Assessment and Plan: 48 y.o. female with   .[name] was seen today for follow-up.  Diagnoses and all orders for this visit:  PTSD (post-traumatic stress disorder) with anxiety and depression -      FLUoxetine (PROZAC) 20 MG capsule; Take 3 capsules (60 mg total) by mouth at bedtime.  Gastroesophageal reflux disease, esophagitis presence not specified -     omeprazole (PRILOSEC) 20 MG capsule; Take 1-2 capsules (20-40 mg total) by mouth daily.    Chronic PTSD, Depression PHQ9=4, improved from 12, no acute safety issues GAD7=12, unchanged Cont Fluoxetine to 60 mg QD Cont Alprazolam prn for breakthrough anxiety/panic Keep f/u with counselor  Hypothyroidism Symptoms improved with 112 mcg Levo Check TSH today  Hiatal hernia/GERD Try reducing Omeprazole back to 20 mg Cont GERD diet  Patient education and anticipatory guidance given Patient agrees with treatment plan Follow-up in 3 months or sooner as needed if symptoms worsen or fail to improve  Darlyne Russian PA-C

## 2018-12-03 NOTE — Patient Instructions (Signed)
[email or web address]

## 2018-12-14 ENCOUNTER — Other Ambulatory Visit: Payer: Self-pay | Admitting: Physician Assistant

## 2018-12-14 DIAGNOSIS — F4310 Post-traumatic stress disorder, unspecified: Secondary | ICD-10-CM

## 2018-12-29 ENCOUNTER — Ambulatory Visit (INDEPENDENT_AMBULATORY_CARE_PROVIDER_SITE_OTHER): Payer: BC Managed Care – PPO

## 2018-12-29 ENCOUNTER — Ambulatory Visit (INDEPENDENT_AMBULATORY_CARE_PROVIDER_SITE_OTHER): Payer: BC Managed Care – PPO | Admitting: Physician Assistant

## 2018-12-29 ENCOUNTER — Encounter: Payer: Self-pay | Admitting: Physician Assistant

## 2018-12-29 ENCOUNTER — Other Ambulatory Visit: Payer: Self-pay

## 2018-12-29 VITALS — BP 127/84 | HR 65 | Temp 98.2°F | Wt 208.0 lb

## 2018-12-29 DIAGNOSIS — H539 Unspecified visual disturbance: Secondary | ICD-10-CM | POA: Diagnosis not present

## 2018-12-29 DIAGNOSIS — R29818 Other symptoms and signs involving the nervous system: Secondary | ICD-10-CM

## 2018-12-29 DIAGNOSIS — Z23 Encounter for immunization: Secondary | ICD-10-CM

## 2018-12-29 DIAGNOSIS — R51 Headache: Secondary | ICD-10-CM

## 2018-12-29 DIAGNOSIS — F4310 Post-traumatic stress disorder, unspecified: Secondary | ICD-10-CM

## 2018-12-29 DIAGNOSIS — R519 Headache, unspecified: Secondary | ICD-10-CM

## 2018-12-29 MED ORDER — CLONAZEPAM 0.5 MG PO TBDP
1.0000 mg | ORAL_TABLET | Freq: Once | ORAL | Status: AC
  Administered 2018-12-29: 1 mg via ORAL

## 2018-12-29 MED ORDER — KETOROLAC TROMETHAMINE 30 MG/ML IJ SOLN
30.0000 mg | Freq: Once | INTRAMUSCULAR | Status: AC
  Administered 2018-12-29: 30 mg via INTRAMUSCULAR

## 2018-12-29 MED ORDER — DEXAMETHASONE SODIUM PHOSPHATE 10 MG/ML IJ SOLN
4.0000 mg | Freq: Once | INTRAMUSCULAR | Status: AC
  Administered 2018-12-29: 4 mg via INTRAMUSCULAR

## 2018-12-29 MED ORDER — ALPRAZOLAM 0.5 MG PO TABS: 0.2500 mg | ORAL_TABLET | Freq: Every day | ORAL | 2 refills | Status: DC | PRN

## 2018-12-29 NOTE — Patient Instructions (Signed)

## 2018-12-29 NOTE — Progress Notes (Signed)
HPI:                                                                [name] is a 48 y.o. female who presents to [facility] today for headache and anxiety  Patient with hx of migraines presents with persistent headache, photophobia, phonophobia for 3 weeks. Headache is left-sided frontal sometimes bilateral frontal, which is consistent with prior migraines. Reports she has not had a migraine of this severity in years. Headache is worse with activity and heat.  Has missed several days of work due to symptoms. Has tried multiple OTC pain relievers without relief. Admits to increased anxiety symptoms, which she thinks could be contributory.  Prior head imaging includes negative [facility] April 2018 (see Care Everywhere) Family hx: mother has meningiomas  Reports she had 2 days last week she had a gray hazy area in her peripheral field of the left eye that she could not see through. This resolved on Thursday 9/10,  She last saw eye doctor 2 years ago. She has never had visual disturbance like this before.    Depression screen [facility] 2/9 12/29/2018 12/03/2018 10/22/2018 09/08/2018 02/25/2018  Decreased Interest 2 1 1 1  0  Down, Depressed, Hopeless 2 1 2 1 1   PHQ - 2 Score 4 2 3 2 1   Altered sleeping 1 1 1 2 1   Tired, decreased energy 1 1 3 2  0  Change in appetite 0 0 1 1 0  Feeling bad or failure about yourself  0 0 2 2 2   Trouble concentrating 1 0 2 1 1   Moving slowly or fidgety/restless 0 0 0 0 0  Suicidal thoughts 0 0 0 0 0  PHQ-9 Score 7 4 12 10 5   Difficult doing work/chores - Somewhat difficult - - Somewhat difficult  Some recent data might be hidden    GAD 7 : Generalized Anxiety Score 12/29/2018 12/03/2018 10/22/2018 09/08/2018  Nervous, Anxious, on Edge 2 2 2 2   Control/stop worrying 2 2 2 2   Worry too much - different things 2 2 1 2   Trouble relaxing 1 1 1 2   Restless 1 1 1 1   Easily annoyed or irritable 1 2 3 3   Afraid -  awful might happen 2 2 2 1   Total GAD 7 Score 11 12 12 13   Anxiety Difficulty - Somewhat difficult - -      Past Medical History:  Diagnosis Date  . Adenomyosis 2013  . Anemia   . Anemia, iron deficiency 12/23/2011  . Anxiety   . Asthma   . Cancer of appendix ([place])   . Complication of anesthesia    scoline pain? from use of Succinylcholine   . Concussion syndrome 07/11/2016  . Cough   . Depression   . Endometriosis   . Generalized headaches   . GERD (gastroesophageal reflux disease)   . Heart murmur   . History of sinus surgery 2010   MAXILLARY, ETHMOID, SPHENOID  . Hypothyroidism   . Iron deficiency anemia   . Kidney stones   . Migraine   . Obesity, Class III, BMI 40-49.9 (morbid obesity) ([place])   . SVD (spontaneous vaginal delivery)    x3  . Vitamin D deficiency 02/02/2017  Past Surgical History:  Procedure Laterality Date  . ABDOMINAL HYSTERECTOMY    . CHOLECYSTECTOMY  01/27/2012   Procedure: LAPAROSCOPIC CHOLECYSTECTOMY;  Surgeon: Harl Bowie, MD;  Location: [facility];  Service: General;  Laterality: N/A;  Laparoscopic chole  . COLPOSCOPY  2007  . KNEE SURGERY     right, scope and ACL repair  . LAPAROSCOPIC APPENDECTOMY  01/02/2012   Procedure: APPENDECTOMY LAPAROSCOPIC;  Surgeon: Harl Bowie, MD;  Location: [facility];  Service: General;  Laterality: N/A;  . LAPAROSCOPIC ASSISTED VAGINAL HYSTERECTOMY  11/13/2011   Procedure: LAPAROSCOPIC ASSISTED VAGINAL HYSTERECTOMY;  Surgeon: Darlyn Chamber, MD;  Location: [facility];  Service: Gynecology;  Laterality: N/A;  . LAPAROSCOPY  05/30/2011   Procedure: LAPAROSCOPY OPERATIVE;  Surgeon: Darlyn Chamber, MD;  Location: [facility];  Service: Gynecology;  Laterality: N/A;  YAG  LASER of Endometriosis.  Marland Kitchen NASAL SINUS SURGERY  2010  . PARTIAL COLECTOMY  01/27/2012   Social History   Tobacco Use  . Smoking status: Current Every Day Smoker    Packs/day: 0.75    Years: 1.00    Pack years: 0.75    Types: Cigarettes  . Smokeless  tobacco: Never Used  Substance Use Topics  . Alcohol use: No   family history includes Alcohol abuse in her brother and father; Asthma in her mother and sister; Colon cancer in her paternal grandmother; Depression in her brother, mother, sister, and sister; Drug abuse in her brother; Lung cancer in her father; Other in her mother; Suicidality in her brother.    ROS: negative except as noted in the HPI  Medications: Current Outpatient Medications  Medication Sig Dispense Refill  . albuterol (VENTOLIN HFA) 108 (90 Base) MCG/ACT inhaler Inhale 1-2 puffs into the lungs every 4 (four) hours as needed for wheezing or shortness of breath. 18 g 3  . ALPRAZolam (XANAX) 0.5 MG tablet TAKE 1/2 TO 1 (ONE-HALF TO ONE) TABLET BY MOUTH AS NEEDED FOR ANXIETY FOR UP TO 1 DOSE 20 tablet 0  . ergocalciferol (VITAMIN D2) 1.25 MG (50000 UT) capsule Take 1 capsule (50,000 Units total) by mouth every 30 (thirty) days for 6 doses. 6 capsule 0  . FLUoxetine (PROZAC) 20 MG capsule Take 3 capsules (60 mg total) by mouth at bedtime. 270 capsule 1  . levothyroxine (SYNTHROID) 112 MCG tablet Take 1 tablet (112 mcg total) by mouth daily before breakfast. 30 tablet 2  . methocarbamol (ROBAXIN) 750 MG tablet Take 0.5-1 tablets (375-750 mg total) by mouth 2 (two) times daily as needed for muscle spasms. 30 tablet 2  . omeprazole (PRILOSEC) 20 MG capsule Take 1-2 capsules (20-40 mg total) by mouth daily. 180 capsule 1  . ondansetron (ZOFRAN-ODT) 4 MG disintegrating tablet Take 1 tablet (4 mg total) by mouth every 8 (eight) hours as needed for nausea or vomiting. 10 tablet 0  . Probiotic Product (PROBIOTIC PO) Take 1 tablet by mouth daily.     . promethazine (PHENERGAN) 12.5 MG tablet Take 1 tablet (12.5 mg total) by mouth every 8 (eight) hours as needed for nausea or vomiting. 20 tablet 0   No current facility-administered medications for this visit.    Allergies  Allergen Reactions  . Reglan [Metoclopramide] Other (See  Comments)    Caused a dystonic reaction.  . Ciprofloxacin Nausea And Vomiting  . Clarithromycin Hives  . Moxifloxacin Hives       Objective:  BP 127/84   Pulse 65   Temp 98.2 F (36.8 C) (  Oral)   Wt 208 lb (94.3 kg)   LMP 11/02/2011   BMI 35.70 kg/m  Gen: well-groomed, not ill-appearing, no acute distress HEENT: head normocephalic, atraumatic; conjunctiva and cornea clear, visual acuity OD 20/40, OS 20/30, OU 20/25 without correction, oropharynx clear, moist mucus membranes; neck supple, no meningeal signs Pulm: Normal work of breathing, normal phonation, clear to auscultation bilaterally CV: Normal rate, regular rhythm, s1 and s2 distinct, no murmurs, clicks or rubs Neuro:  cranial nerves II-XII intact, no nystagmus, normal finger-to-nose, normal heel-to-shin, negative pronator drift, normal rapid alternating movements, DTR's intact, normal tone, no tremor MSK: strength 5/5 and symmetric in bilateral upper and lower extremities, normal gait and station Mental Status: alert and oriented x 3, speech articulate, and thought processes clear and goal-directed   Acute Interface, Incoming Rad Results - 07/29/2016  2:14 AM EDT INDICATION:    dizzy, off balance, confused,  COMPARISON:   05/29/2015  TECHNIQUE:    Multiple axial images obtained from the skull base to the vertex without IV contrast  were obtained on  07/29/2016 1:53 AM  FINDINGS:  No evidence of hydrocephalus. No intracranial mass, mass effect or midline shift. No acute infarction evident. No intracranial hemorrhage. Paranasal sinuses: Clear. Mastoid air cells: Clear. Calvarium: Intact.   IMPRESSION: No acute intracranial abnormality.  Assessment and Plan: 48 y.o. female with   .Stacy was seen today for migraine and anxiety.  Diagnoses and all orders for this visit:  Acute intractable headache, unspecified headache type -     MR Brain Wo Contrast -     ketorolac (TORADOL) 30 MG/ML injection 30 mg -      dexamethasone (DECADRON) injection 4 mg  Need for immunization against influenza -     Flu Vaccine QUAD 36+ mos IM  Visual disturbance -     Ambulatory referral to Optometry -     MR Brain Wo Contrast  Other symptoms and signs involving the nervous system -     MR Brain Wo Contrast     Reassuring neuro exam Headache has migrainous features and patient has history of migraines The history of new onset visual disturbance of the left eye warrants further evaluation, even though it has resolved MRI pending today, patient given Clonazepam 1 mg 1 hour prior to MRI for anti-anxiety. Patient has transporation today (husband) Referral placed to Optometry for eye exam tomorrow Migraine cocktail of Decadron 4 mg IM and Toradol 30 mg IM given in office today Await MRI result before considering Naratriptan taper for intractable migraine   Patient education and anticipatory guidance given Patient agrees with treatment plan Follow-up as needed if symptoms worsen or fail to improve  Darlyne Russian PA-C

## 2018-12-31 ENCOUNTER — Encounter: Payer: Self-pay | Admitting: Physician Assistant

## 2018-12-31 DIAGNOSIS — G43011 Migraine without aura, intractable, with status migrainosus: Secondary | ICD-10-CM

## 2018-12-31 MED ORDER — NARATRIPTAN HCL 2.5 MG PO TABS: ORAL_TABLET | ORAL | 0 refills | Status: DC

## 2019-01-22 ENCOUNTER — Telehealth: Payer: Self-pay | Admitting: *Deleted

## 2019-01-22 DIAGNOSIS — G43011 Migraine without aura, intractable, with status migrainosus: Secondary | ICD-10-CM

## 2019-01-22 MED ORDER — NARATRIPTAN HCL 2.5 MG PO TABS: ORAL_TABLET | ORAL | 0 refills | Status: DC

## 2019-01-22 NOTE — Telephone Encounter (Signed)
OK to refill the Naratriptan I sent it Needs appt before other changes/new meds

## 2019-01-22 NOTE — Telephone Encounter (Signed)
Pt left vm this morning stating that she is in transition from [place] to Dr. Darene Lamer since he'd seen her in the past and needs a Neurology referral for the headache that she's had since she saw Evlyn Clines last, which was mid-September.  She also would like for something to be sent in for her in the meantime.  Routing this to Dr. Georgina Snell as well since he's covering [place] today.

## 2019-01-22 NOTE — Telephone Encounter (Signed)
I have never agreed to take this new patient, and unfortunately I am no longer taking new patients.  She will likely need to establish with either Dr. Sheppard Coil, Samuel Bouche, DNP, or Dr. Luetta Nutting.    Dr. Georgina Snell is covering [place] today.  I am covering [place].

## 2019-01-22 NOTE — Telephone Encounter (Signed)
Left VM for Pt with update  

## 2019-01-22 NOTE — Telephone Encounter (Signed)
[name] states [place] asked if Dr Dianah Field would take over her care. Patient states Dr Dianah Field agreed.    [name] has had a headache for months and the [place] did help while she was on the medication. She states she has to work this weekend and will need something for her headache. Please advise.

## 2019-02-06 ENCOUNTER — Other Ambulatory Visit: Payer: Self-pay

## 2019-02-06 ENCOUNTER — Encounter (HOSPITAL_COMMUNITY): Payer: Self-pay

## 2019-02-06 ENCOUNTER — Ambulatory Visit (HOSPITAL_COMMUNITY)
Admission: EM | Admit: 2019-02-06 | Discharge: 2019-02-06 | Disposition: A | Discharge status: Home / Self Care | Payer: BC Managed Care – PPO | Attending: Emergency Medicine | Admitting: Emergency Medicine

## 2019-02-06 DIAGNOSIS — Z20828 Contact with and (suspected) exposure to other viral communicable diseases: Secondary | ICD-10-CM | POA: Insufficient documentation

## 2019-02-06 DIAGNOSIS — B349 Viral infection, unspecified: Secondary | ICD-10-CM

## 2019-02-06 LAB — POCT RAPID STREP A: Streptococcus, Group A Screen (Direct): NEGATIVE

## 2019-02-06 NOTE — Discharge Instructions (Addendum)
Take Tylenol or ibuprofen as needed.    Your rapid strep test is negative.  A throat culture is pending; we will call you if it is positive requiring treatment.    Your COVID test is pending.  You should self quarantine until your test result is back and is negative.    Go to the emergency department if you develop high fever, shortness of breath, severe diarrhea, or other concerning symptoms.

## 2019-02-06 NOTE — ED Triage Notes (Signed)
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 Pt states she is having bad headache x 5 days. Pt states having sore throat,nausea, body aches and chills x 2 days.
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
## 2019-02-08 LAB — NOVEL CORONAVIRUS, NAA (HOSP ORDER, SEND-OUT TO REF LAB; TAT 18-24 HRS): SARS-CoV-2, NAA: NOT DETECTED

## 2019-02-08 LAB — CULTURE, GROUP A STREP (THRC)

## 2019-02-09 ENCOUNTER — Other Ambulatory Visit: Payer: Self-pay

## 2019-02-09 ENCOUNTER — Ambulatory Visit (INDEPENDENT_AMBULATORY_CARE_PROVIDER_SITE_OTHER): Payer: BC Managed Care – PPO | Admitting: Osteopathic Medicine

## 2019-02-09 ENCOUNTER — Encounter: Payer: Self-pay | Admitting: Osteopathic Medicine

## 2019-02-09 VITALS — BP 126/84 | HR 68 | Temp 97.9°F | Wt 213.0 lb

## 2019-02-09 DIAGNOSIS — R519 Headache, unspecified: Secondary | ICD-10-CM

## 2019-02-09 MED ORDER — TOPIRAMATE 50 MG PO TABS: 50.0000 mg | ORAL_TABLET | Freq: Two times a day (BID) | ORAL | 1 refills | Status: DC

## 2019-02-09 NOTE — Progress Notes (Signed)
HPI: [name] is a 48 y.o. female who  has a past medical history of Adenomyosis (2013), Anemia, Anemia, iron deficiency (12/23/2011), Anxiety, Asthma, Cancer of appendix ([place]), Complication of anesthesia, Concussion syndrome (07/11/2016), Cough, Depression, Endometriosis, Generalized headaches, GERD (gastroesophageal reflux disease), Heart murmur, History of sinus surgery (2010), Hypothyroidism, Iron deficiency anemia, Kidney stones, Migraine, Obesity, Class III, BMI 40-49.9 (morbid obesity) ([place]), SVD (spontaneous vaginal delivery), and Vitamin D deficiency (02/02/2017).  she presents to [facility] today, 02/09/19,  for chief complaint of: headache  Headache . Context: history of migraine, this headache feels a bit different. 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  . Location/Quality: feels like band around the whole head is tightening. (+)nausea and photophobia  . Duration: 10 weeks total  . Timing: daily, waxes and wanes . Modifying factors: Frovatriptan taper a few times has helped  Recent UC visit for viral illness, negative COVID test, pt is afebrile, still some scratchy throat.     At today's visit 02/09/19 ... PMH, PSH, FH reviewed and updated as needed.  Current medication list and allergy/intolerance hx reviewed and updated as needed. (See remainder of HPI, ROS, Phys Exam below)   No results found.  Results for orders placed or performed during the hospital encounter of 02/06/19 (from the past 72 hour(s))  Novel Coronavirus, NAA (Hosp order, Send-out to Ref Lab; TAT 18-24 hrs     Status: None   Collection Time: 02/06/19 12:49 PM   Specimen: Nasopharyngeal Swab; Respiratory  Result Value Ref Range   SARS-CoV-2, NAA NOT DETECTED NOT DETECTED    Comment: (NOTE) This nucleic acid amplification test was developed and its performance characteristics determined  by [employer]. Nucleic acid amplification tests include PCR and TMA. This test has not been FDA cleared or approved. This test has been authorized by FDA under an Emergency Use Authorization (EUA). This test is only authorized for the duration of time the declaration that circumstances exist justifying the authorization of the emergency use of in vitro diagnostic tests for detection of SARS-CoV-2 virus and/or diagnosis of COVID-19 infection under section 564(b)(1) of the Act, 21 U.S.C. [id number]) (1), unless the authorization is terminated or revoked sooner. When diagnostic testing is negative, the possibility of a false negative result should be considered in the context of a patient's recent exposures and the presence of clinical signs and symptoms consistent with COVID-19. An individual without symptoms of COVID- 19 and who is not shedding SARS-CoV-2 vi rus would expect to have a negative (not detected) result in this assay. Performed At: [facility] [place], [place] [id number] Rush Farmer MD [id number]    Coronavirus Source NASOPHARYNGEAL     Comment: Performed at [facility], [place] [address], [place], [place] [address]  POCT rapid strep A [facility])     Status: None   Collection Time: 02/06/19 12:55 PM  Result Value Ref Range   Streptococcus, Group A Screen (Direct) NEGATIVE NEGATIVE  Culture, group A strep (throat)     Status: None   Collection Time: 02/06/19 12:56 PM   Specimen: Throat  Result Value Ref Range   Specimen Description THROAT    Special Requests NONE    Culture      NO GROUP A STREP (S.PYOGENES) ISOLATED Performed at [facility], [place] [address], [place], [place] [address]    Report Status 02/08/2019 FINAL  ASSESSMENT/PLAN: The encounter diagnosis was Persistent headaches.   Trial topamax, pt would like to go with this for potential weight loss benefit.   Reassuring negative  COVID but I reiterated self-isolation guidelines for viral/respiratory illness regardless of results   Orders Placed This Encounter  Procedures  . Ambulatory referral to Neurology     Meds ordered this encounter  Medications  . topiramate (TOPAMAX) 50 MG tablet    Sig: Take 1 tablet (50 mg total) by mouth 2 (two) times daily. (Start with 1/2 tablet [25 mg] po bid for first week if desired)    Dispense:  60 tablet    Refill:  1    There are no Patient Instructions on file for this visit.    Follow-up plan: Return in about 6 weeks (around 03/23/2019) for recheck headaches w/ Dr A (unless you're able to get in with neurologist sooner) .                                                 ################################################# ################################################# ################################################# #################################################    Current Meds  Medication Sig  . albuterol (VENTOLIN HFA) 108 (90 Base) MCG/ACT inhaler Inhale 1-2 puffs into the lungs every 4 (four) hours as needed for wheezing or shortness of breath.  . ALPRAZolam (XANAX) 0.5 MG tablet Take 0.5-1 tablets (0.25-0.5 mg total) by mouth daily as needed for anxiety (panic).  Marland Kitchen ergocalciferol (VITAMIN D2) 1.25 MG (50000 UT) capsule Take 1 capsule (50,000 Units total) by mouth every 30 (thirty) days for 6 doses.  Marland Kitchen FLUoxetine (PROZAC) 20 MG capsule Take 3 capsules (60 mg total) by mouth at bedtime.  Marland Kitchen levothyroxine (SYNTHROID) 112 MCG tablet Take 1 tablet (112 mcg total) by mouth daily before breakfast.  . methocarbamol (ROBAXIN) 750 MG tablet Take 0.5-1 tablets (375-750 mg total) by mouth 2 (two) times daily as needed for muscle spasms.  Marland Kitchen omeprazole (PRILOSEC) 20 MG capsule Take 1-2 capsules (20-40 mg total) by mouth daily.  . Probiotic Product (PROBIOTIC PO) Take 1 tablet by mouth daily.     Allergies  Allergen Reactions   . Reglan [Metoclopramide] Other (See Comments)    Caused a dystonic reaction.  . Ciprofloxacin Nausea And Vomiting  . Clarithromycin Hives  . Moxifloxacin Hives       Review of Systems:  Constitutional: No recent illness  HEENT: +headache, no vision change  Cardiac: No  chest pain, No  pressure, No palpitations  Respiratory:  No  shortness of breath. No  Cough  Gastrointestinal: No  abdominal pain  Musculoskeletal: No new myalgia/arthralgia  Neurologic: No  weakness, No  Dizziness  Psychiatric: No  concerns with depression, +concerns with anxiety  Exam:  BP 126/84 (BP Location: Right Arm, Patient Position: Sitting, Cuff Size: Large)   Pulse 68   Temp 97.9 F (36.6 C) (Oral)   Wt 213 lb 0.6 oz (96.6 kg)   LMP 11/02/2011   BMI 36.57 kg/m   Constitutional: VS see above. General Appearance: alert, well-developed, well-nourished, NAD  Neck: No masses, trachea midline.   Respiratory: Normal respiratory effort.   Musculoskeletal: Gait normal.   Neurological: Normal balance/coordination. No tremor.  Skin: warm, dry, intact.   Psychiatric: Normal judgment/insight. Normal mood and affect. Oriented x3.       Visit summary with medication list and pertinent instructions was printed for patient to review,  patient was advised to alert [place] if any updates are needed. All questions at time of visit were answered - patient instructed to contact office with any additional concerns. ER/RTC precautions were reviewed with the patient and understanding verbalized.   Note: Total time spent 25 minutes, greater than 50% of the visit was spent face-to-face counseling and coordinating care for the following: The encounter diagnosis was Persistent headaches..  Please note: voice recognition software was used to produce this document, and typos may escape review. Please contact Dr. Sheppard Coil for any needed clarifications.    Follow up plan: Return in about 6 weeks (around 03/23/2019)  for recheck headaches w/ Dr A (unless you're able to get in with neurologist sooner) .

## 2019-02-10 NOTE — Telephone Encounter (Signed)
Left patient a voicemail with information below. Let patient know to call us back to schedule an appointment for her daughters.

## 2019-02-10 NOTE — Telephone Encounter (Signed)
-----   Message from Emeterio Reeve, DO sent at 02/09/2019 12:28 PM EDT ----- Please call pt: she had a couple daughters she wanted to get established w/ me if we can work them in somewhere between now and January? OK to do back-to-back if they'd prefer.

## 2019-02-10 NOTE — Telephone Encounter (Signed)
Noted. Thank you for the update.

## 2019-02-17 ENCOUNTER — Other Ambulatory Visit: Payer: Self-pay | Admitting: Physician Assistant

## 2019-02-17 DIAGNOSIS — M791 Myalgia, unspecified site: Secondary | ICD-10-CM

## 2019-02-19 ENCOUNTER — Telehealth: Payer: Self-pay | Admitting: Osteopathic Medicine

## 2019-02-19 NOTE — Telephone Encounter (Signed)
April from [facility] called to let us know that they have called and left messages for patient to schedule appointment. They have mailed a letter as well. - CF

## 2019-02-19 NOTE — Telephone Encounter (Signed)
mychart message sent

## 2019-02-23 ENCOUNTER — Other Ambulatory Visit: Payer: Self-pay | Admitting: Osteopathic Medicine

## 2019-02-23 DIAGNOSIS — J4540 Moderate persistent asthma, uncomplicated: Secondary | ICD-10-CM

## 2019-02-23 MED ORDER — ALBUTEROL SULFATE HFA 108 (90 BASE) MCG/ACT IN AERS: 1.0000 | INHALATION_SPRAY | RESPIRATORY_TRACT | 3 refills | Status: DC | PRN

## 2019-02-24 ENCOUNTER — Other Ambulatory Visit: Payer: Self-pay | Admitting: Physician Assistant

## 2019-02-24 ENCOUNTER — Telehealth: Payer: Self-pay | Admitting: Osteopathic Medicine

## 2019-02-24 DIAGNOSIS — E038 Other specified hypothyroidism: Secondary | ICD-10-CM

## 2019-02-24 NOTE — Telephone Encounter (Signed)
hey this pt needs an appt for Thyroid and must have her labs done-Per tonya, when calling patient mailbox was full and was unable to leave information, called twice.

## 2019-03-04 ENCOUNTER — Ambulatory Visit: Payer: BC Managed Care – PPO | Admitting: Sports Medicine

## 2019-04-10 ENCOUNTER — Other Ambulatory Visit: Payer: Self-pay

## 2019-04-10 ENCOUNTER — Encounter (HOSPITAL_COMMUNITY): Payer: Self-pay

## 2019-04-10 ENCOUNTER — Ambulatory Visit (HOSPITAL_COMMUNITY)
Admission: EM | Admit: 2019-04-10 | Discharge: 2019-04-10 | Disposition: A | Discharge status: Home / Self Care | Payer: BC Managed Care – PPO | Attending: Urgent Care | Admitting: Urgent Care

## 2019-04-10 DIAGNOSIS — K047 Periapical abscess without sinus: Secondary | ICD-10-CM | POA: Diagnosis not present

## 2019-04-10 DIAGNOSIS — K0889 Other specified disorders of teeth and supporting structures: Secondary | ICD-10-CM

## 2019-04-10 MED ORDER — AMOXICILLIN-POT CLAVULANATE 875-125 MG PO TABS: 1.0000 | ORAL_TABLET | Freq: Two times a day (BID) | ORAL | 0 refills | Status: DC

## 2019-04-10 MED ORDER — NAPROXEN 500 MG PO TABS: 500.0000 mg | ORAL_TABLET | Freq: Two times a day (BID) | ORAL | 0 refills | Status: DC

## 2019-04-10 MED ORDER — HYDROCODONE-ACETAMINOPHEN 5-325 MG PO TABS: 2.0000 | ORAL_TABLET | Freq: Three times a day (TID) | ORAL | 0 refills | Status: DC | PRN

## 2019-04-10 NOTE — ED Provider Notes (Signed)
[facility]   MRN: [id number] DOB: 1970/09/16  Subjective:   [name] is a 48 y.o. female presenting for several day hx of recurrent right upper dental pain.  Now has pain radiating to her right ear and has a headache.  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  She has been using over-the-counter measures to help with her dental pain.  No current facility-administered medications for this encounter.  Current Outpatient Medications:  .  albuterol (VENTOLIN HFA) 108 (90 Base) MCG/ACT inhaler, Inhale 1-2 puffs into the lungs every 4 (four) hours as needed for wheezing or shortness of breath., Disp: 18 g, Rfl: 3 .  ALPRAZolam (XANAX) 0.5 MG tablet, Take 0.5-1 tablets (0.25-0.5 mg total) by mouth daily as needed for anxiety (panic)., Disp: 20 tablet, Rfl: 2 .  FLUoxetine (PROZAC) 20 MG capsule, Take 3 capsules (60 mg total) by mouth at bedtime., Disp: 270 capsule, Rfl: 1 .  levothyroxine (EUTHYROX) 112 MCG tablet, Take 1 tablet (112 mcg total) by mouth daily. [place].MUST SCHEDULE/KEEP APPOINTMENT AND GET LABS DONE, Disp: 30 tablet, Rfl: 0 .  methocarbamol (ROBAXIN) 750 MG tablet, TAKE 1/2-1 TABLET BY MOUTH  TWICE DAILY AS NEEDED FOR MUSCLE SPASMS, Disp: 30 tablet, Rfl: 0 .  naratriptan (AMERGE) 2.5 MG tablet, Take 1 tab twice a day for 2 days, then 1 tab a day for 2 days, the 1/2 tab a day for 2 days then off (Patient not taking: Reported on 02/09/2019), Disp: 7 tablet, Rfl: 0 .  omeprazole (PRILOSEC) 20 MG capsule, Take 1-2 capsules (20-40 mg total) by mouth daily., Disp: 180 capsule, Rfl: 1 .  ondansetron (ZOFRAN-ODT) 4 MG disintegrating tablet, Take 1 tablet (4 mg total) by mouth every 8 (eight) hours as needed for nausea or vomiting. (Patient not taking: Reported on 02/09/2019), Disp: 10 tablet, Rfl: 0 .  Probiotic Product (PROBIOTIC PO), Take 1 tablet by mouth daily. , Disp: , Rfl:  .   promethazine (PHENERGAN) 12.5 MG tablet, Take 1 tablet (12.5 mg total) by mouth every 8 (eight) hours as needed for nausea or vomiting. (Patient not taking: Reported on 02/09/2019), Disp: 20 tablet, Rfl: 0 .  topiramate (TOPAMAX) 50 MG tablet, Take 1 tablet (50 mg total) by mouth 2 (two) times daily. (Start with 1/2 tablet [25 mg] po bid for first week if desired), Disp: 60 tablet, Rfl: 1   Allergies  Allergen Reactions  . Reglan [Metoclopramide] Other (See Comments)    Caused a dystonic reaction.  . Ciprofloxacin Nausea And Vomiting  . Clarithromycin Hives  . Moxifloxacin Hives    Past Medical History:  Diagnosis Date  . Adenomyosis 2013  . Anemia   . Anemia, iron deficiency 12/23/2011  . Anxiety   . Asthma   . Cancer of appendix ([place])   . Complication of anesthesia    scoline pain? from use of Succinylcholine   . Concussion syndrome 07/11/2016  . Cough   . Depression   . Endometriosis   . Generalized headaches   . GERD (gastroesophageal reflux disease)   . Heart murmur   . History of sinus surgery 2010   MAXILLARY, ETHMOID, SPHENOID  . Hypothyroidism   . Iron deficiency anemia   . Kidney stones   . Migraine   . Obesity, Class III, BMI 40-49.9 (morbid obesity) ([place])   . SVD (spontaneous vaginal delivery)    x3  . Vitamin D  deficiency 02/02/2017     Past Surgical History:  Procedure Laterality Date  . ABDOMINAL HYSTERECTOMY    . CHOLECYSTECTOMY  01/27/2012   Procedure: LAPAROSCOPIC CHOLECYSTECTOMY;  Surgeon: Harl Bowie, MD;  Location: [facility];  Service: General;  Laterality: N/A;  Laparoscopic chole  . COLPOSCOPY  2007  . KNEE SURGERY     right, scope and ACL repair  . LAPAROSCOPIC APPENDECTOMY  01/02/2012   Procedure: APPENDECTOMY LAPAROSCOPIC;  Surgeon: Harl Bowie, MD;  Location: [facility];  Service: General;  Laterality: N/A;  . LAPAROSCOPIC ASSISTED VAGINAL HYSTERECTOMY  11/13/2011   Procedure: LAPAROSCOPIC ASSISTED VAGINAL HYSTERECTOMY;  Surgeon: Darlyn Chamber, MD;  Location: [facility];  Service: Gynecology;  Laterality: N/A;  . LAPAROSCOPY  05/30/2011   Procedure: LAPAROSCOPY OPERATIVE;  Surgeon: Darlyn Chamber, MD;  Location: [facility];  Service: Gynecology;  Laterality: N/A;  YAG  LASER of Endometriosis.  Marland Kitchen NASAL SINUS SURGERY  2010  . PARTIAL COLECTOMY  01/27/2012    Family History  Problem Relation Age of Onset  . Lung cancer Father   . Alcohol abuse Father   . Depression Mother   . Asthma Mother   . Other Mother   . Depression Sister   . Suicidality Brother   . Depression Brother   . Alcohol abuse Brother   . Drug abuse Brother   . Depression Sister   . Colon cancer Paternal Grandmother   . Asthma Sister   . Anesthesia problems Neg Hx     Social History   Tobacco Use  . Smoking status: Current Every Day Smoker    Packs/day: 0.75    Years: 1.00    Pack years: 0.75    Types: Cigarettes  . Smokeless tobacco: Never Used  Substance Use Topics  . Alcohol use: No  . Drug use: No    ROS   Objective:   Vitals: BP (!) 155/91 (BP Location: Right Arm)   Pulse 70   Temp 97.9 F (36.6 C) (Oral)   Resp 17   LMP 11/02/2011   SpO2 100%   Physical Exam Constitutional:      General: She is not in acute distress.    Appearance: Normal appearance. She is well-developed. She is not ill-appearing, toxic-appearing or diaphoretic.  HENT:     Head: Normocephalic and atraumatic.     Nose: Nose normal.     Mouth/Throat:     Mouth: Mucous membranes are moist.     Dentition: Abnormal dentition. Dental tenderness, gingival swelling and dental caries present.     Pharynx: Oropharynx is clear.   Eyes:     General: No scleral icterus.    Extraocular Movements: Extraocular movements intact.     Pupils: Pupils are equal, round, and reactive to light.  Cardiovascular:     Rate and Rhythm: Normal rate.  Pulmonary:     Effort: Pulmonary effort is normal.  Skin:    General: Skin is warm and dry.  Neurological:     General: No focal  deficit present.     Mental Status: She is alert and oriented to person, place, and time.  Psychiatric:        Mood and Affect: Mood normal.        Behavior: Behavior normal.      Assessment and Plan :   1. Dental abscess   2. Dental infection   3. Tooth pain     Start Augmentin for dental abscess, use naproxen for pain and  inflammation.  Use hydrocodone for breakthrough pain.  Emphasized need for dental surgeon consult. Counseled patient on potential for adverse effects with medications prescribed/recommended today, strict ER and return-to-clinic precautions discussed, patient verbalized understanding.    Jaynee Eagles, [place] 04/10/19 1537

## 2019-04-10 NOTE — ED Triage Notes (Signed)
Pt presents with right side dental pain over past few days that is causing a headache and ear pain.

## 2019-04-12 ENCOUNTER — Ambulatory Visit (INDEPENDENT_AMBULATORY_CARE_PROVIDER_SITE_OTHER): Payer: BC Managed Care – PPO | Admitting: Osteopathic Medicine

## 2019-04-12 ENCOUNTER — Encounter: Payer: Self-pay | Admitting: Osteopathic Medicine

## 2019-04-12 VITALS — HR 83 | Temp 98.7°F | Wt 200.3 lb

## 2019-04-12 DIAGNOSIS — K047 Periapical abscess without sinus: Secondary | ICD-10-CM

## 2019-04-12 DIAGNOSIS — E063 Autoimmune thyroiditis: Secondary | ICD-10-CM

## 2019-04-12 DIAGNOSIS — R519 Headache, unspecified: Secondary | ICD-10-CM | POA: Diagnosis not present

## 2019-04-12 DIAGNOSIS — E038 Other specified hypothyroidism: Secondary | ICD-10-CM

## 2019-04-12 DIAGNOSIS — F4310 Post-traumatic stress disorder, unspecified: Secondary | ICD-10-CM

## 2019-04-12 DIAGNOSIS — R946 Abnormal results of thyroid function studies: Secondary | ICD-10-CM | POA: Diagnosis not present

## 2019-04-12 DIAGNOSIS — F329 Major depressive disorder, single episode, unspecified: Secondary | ICD-10-CM

## 2019-04-12 DIAGNOSIS — F419 Anxiety disorder, unspecified: Secondary | ICD-10-CM

## 2019-04-12 MED ORDER — TOPIRAMATE 50 MG PO TABS: 50.0000 mg | ORAL_TABLET | Freq: Two times a day (BID) | ORAL | 3 refills | Status: DC

## 2019-04-12 MED ORDER — BUPROPION HCL ER (XL) 150 MG PO TB24: 150.0000 mg | ORAL_TABLET | ORAL | 0 refills | Status: DC

## 2019-04-12 MED ORDER — ALPRAZOLAM 0.5 MG PO TABS: 0.2500 mg | ORAL_TABLET | Freq: Every day | ORAL | 2 refills | Status: AC | PRN

## 2019-04-12 NOTE — Progress Notes (Signed)
Virtual Visit via Video (App used: Doximity) Note  I connected with      [name] on 04/12/19 at 8:19 AM  by a telemedicine application and verified that I am speaking with the correct person using two identifiers.  Patient is at home I am in office   I discussed the limitations of evaluation and management by telemedicine and the availability of in person appointments. The patient expressed understanding and agreed to proceed.  History of Present Illness: [name] is a 48 y.o. female who would like to discuss headache follow-up    At visit 02/09/19 we referred to neurology and started topamax for presumed tension headaches vs migraines, headache history relatively new and pt believes it's attributable to wearing face shields/goggles around head at work. Has been helped by triptan taper a few times. Associated nausea and photophobia. Topamax is helping!  Dental abscess, seen at ER 04/10/19. Waiting for the antibiotics to start working.  Anx/Dep: not doing well. Has apt w/ psych in 06/2019 Previous medications: Sertraline didn't seem to work after 10 years      Observations/Objective: Pulse 83   Temp 98.7 F (37.1 C) (Oral)   Wt 200 lb 4.8 oz (90.9 kg)   LMP 11/02/2011   BMI 34.38 kg/m  BP Readings from Last 3 Encounters:  04/10/19 (!) 155/91  02/09/19 126/84  02/06/19 117/82   Wt Readings from Last 3 Encounters:  04/12/19 200 lb 4.8 oz (90.9 kg)  02/09/19 213 lb 0.6 oz (96.6 kg)  12/29/18 208 lb (94.3 kg)    Exam: Normal Speech.  NAD  Lab and Radiology Results No results found for this or any previous visit (from the past 72 hour(s)). No results found.     Assessment and Plan: 48 y.o. female with The primary encounter diagnosis was Persistent headaches. Diagnoses of Abnormal thyroid function test, Hypothyroidism due to Hashimoto's thyroiditis, Dental abscess, and PTSD (post-traumatic stress disorder) with anxiety and depression were  also pertinent to this visit.   PDMP not reviewed this encounter. Orders Placed This Encounter  Procedures  . Ambulatory referral to Dentistry    Referral Priority:   Routine    Referral Type:   Consultation    Referral Reason:   Specialty Services Required    Requested Specialty:   Dental General Practice    Number of Visits Requested:   1   Meds ordered this encounter  Medications  . buPROPion (WELLBUTRIN XL) 150 MG 24 hr tablet    Sig: Take 1 tablet (150 mg total) by mouth every morning.    Dispense:  90 tablet    Refill:  0  . topiramate (TOPAMAX) 50 MG tablet    Sig: Take 1 tablet (50 mg total) by mouth 2 (two) times daily.    Dispense:  180 tablet    Refill:  3  . ALPRAZolam (XANAX) 0.5 MG tablet    Sig: Take 0.5-1 tablets (0.25-0.5 mg total) by mouth daily as needed for anxiety (panic).    Dispense:  30 tablet    Refill:  2    Must last 30 days   Patient Instructions  Plan:  Refilled Alprazolam and Topamax  Continue antibiotics - I sent referral to dentist  Will trial adding Wellbutrin to Prozac for mood  Get thyroid labs ASAP!       Instructions sent via MyChart. If MyChart not available, pt was given option for info via personal e-mail w/ no guarantee of protected health  info over unsecured e-mail communication, and MyChart sign-up instructions were sent to patient.   Follow Up Instructions: Return in about 6 weeks (around 05/24/2019) for virtual visit - check up on moods w/ addition of Wellbutrin. See me sooner if needed! .    I discussed the assessment and treatment plan with the patient. The patient was provided an opportunity to ask questions and all were answered. The patient agreed with the plan and demonstrated an understanding of the instructions.   The patient was advised to call back or seek an in-person evaluation if any new concerns, if symptoms worsen or if the condition fails to improve as anticipated.  25 minutes of non-face-to-face time was  provided during this encounter.      . . . . . . . . . . . . . Marland Kitchen                   Historical information moved to improve visibility of documentation.  Past Medical History:  Diagnosis Date  . Adenomyosis 2013  . Anemia   . Anemia, iron deficiency 12/23/2011  . Anxiety   . Asthma   . Cancer of appendix ([place])   . Complication of anesthesia    scoline pain? from use of Succinylcholine   . Concussion syndrome 07/11/2016  . Cough   . Depression   . Endometriosis   . Generalized headaches   . GERD (gastroesophageal reflux disease)   . Heart murmur   . History of sinus surgery 2010   MAXILLARY, ETHMOID, SPHENOID  . Hypothyroidism   . Iron deficiency anemia   . Kidney stones   . Migraine   . Obesity, Class III, BMI 40-49.9 (morbid obesity) ([place])   . SVD (spontaneous vaginal delivery)    x3  . Vitamin D deficiency 02/02/2017   Past Surgical History:  Procedure Laterality Date  . ABDOMINAL HYSTERECTOMY    . CHOLECYSTECTOMY  01/27/2012   Procedure: LAPAROSCOPIC CHOLECYSTECTOMY;  Surgeon: Harl Bowie, MD;  Location: [facility];  Service: General;  Laterality: N/A;  Laparoscopic chole  . COLPOSCOPY  2007  . KNEE SURGERY     right, scope and ACL repair  . LAPAROSCOPIC APPENDECTOMY  01/02/2012   Procedure: APPENDECTOMY LAPAROSCOPIC;  Surgeon: Harl Bowie, MD;  [facility];  Service: General;  Laterality: N/A;  . LAPAROSCOPIC ASSISTED VAGINAL HYSTERECTOMY  11/13/2011   Procedure: LAPAROSCOPIC ASSISTED VAGINAL HYSTERECTOMY;  Surgeon: Darlyn Chamber, MD;  Location: [facility];  Service: Gynecology;  Laterality: N/A;  . LAPAROSCOPY  05/30/2011   Procedure: LAPAROSCOPY OPERATIVE;  Surgeon: Darlyn Chamber, MD;  Location: [facility];  Service: Gynecology;  Laterality: N/A;  YAG  LASER of Endometriosis.  Marland Kitchen NASAL SINUS SURGERY  2010  . PARTIAL COLECTOMY  01/27/2012   Social History   Tobacco Use  . Smoking status: Former Smoker    Packs/day: 0.75     Years: 1.00    Pack years: 0.75    Types: Cigarettes    Quit date: 2017    Years since quitting: 3.9  . Smokeless tobacco: Never Used  Substance Use Topics  . Alcohol use: No   family history includes Alcohol abuse in her brother and father; Asthma in her mother and sister; Colon cancer in her paternal grandmother; Depression in her brother, mother, sister, and sister; Drug abuse in her brother; Lung cancer in her father; Other in her mother; Suicidality in her brother.  Medications: Current Outpatient Medications  Medication Sig Dispense  Refill  . albuterol (VENTOLIN HFA) 108 (90 Base) MCG/ACT inhaler Inhale 1-2 puffs into the lungs every 4 (four) hours as needed for wheezing or shortness of breath. 18 g 3  . ALPRAZolam (XANAX) 0.5 MG tablet Take 0.5-1 tablets (0.25-0.5 mg total) by mouth daily as needed for anxiety (panic). 30 tablet 2  . amoxicillin-clavulanate (AUGMENTIN) 875-125 MG tablet Take 1 tablet by mouth every 12 (twelve) hours. 20 tablet 0  . FLUoxetine (PROZAC) 20 MG capsule Take 3 capsules (60 mg total) by mouth at bedtime. 270 capsule 1  . HYDROcodone-acetaminophen (NORCO/VICODIN) 5-325 MG tablet Take 2 tablets by mouth every 8 (eight) hours as needed for severe pain. 10 tablet 0  . levothyroxine (EUTHYROX) 112 MCG tablet Take 1 tablet (112 mcg total) by mouth daily. [place].MUST SCHEDULE/KEEP APPOINTMENT AND GET LABS DONE 30 tablet 0  . methocarbamol (ROBAXIN) 750 MG tablet TAKE 1/2-1 TABLET BY MOUTH  TWICE DAILY AS NEEDED FOR MUSCLE SPASMS 30 tablet 0  . naproxen (NAPROSYN) 500 MG tablet Take 1 tablet (500 mg total) by mouth 2 (two) times daily. 30 tablet 0  . naratriptan (AMERGE) 2.5 MG tablet Take 1 tab twice a day for 2 days, then 1 tab a day for 2 days, the 1/2 tab a day for 2 days then off 7 tablet 0  . omeprazole (PRILOSEC) 20 MG capsule Take 1-2 capsules (20-40 mg total) by mouth daily. 180 capsule 1  . ondansetron (ZOFRAN-ODT) 4 MG disintegrating tablet  Take 1 tablet (4 mg total) by mouth every 8 (eight) hours as needed for nausea or vomiting. 10 tablet 0  . Probiotic Product (PROBIOTIC PO) Take 1 tablet by mouth daily.     . promethazine (PHENERGAN) 12.5 MG tablet Take 1 tablet (12.5 mg total) by mouth every 8 (eight) hours as needed for nausea or vomiting. 20 tablet 0  . topiramate (TOPAMAX) 50 MG tablet Take 1 tablet (50 mg total) by mouth 2 (two) times daily. 180 tablet 3  . buPROPion (WELLBUTRIN XL) 150 MG 24 hr tablet Take 1 tablet (150 mg total) by mouth every morning. 90 tablet 0   No current facility-administered medications for this visit.   Allergies  Allergen Reactions  . Reglan [Metoclopramide] Other (See Comments)    Caused a dystonic reaction.  . Ciprofloxacin Nausea And Vomiting  . Clarithromycin Hives  . Moxifloxacin Hives

## 2019-04-12 NOTE — Patient Instructions (Addendum)
Plan:  Refilled Alprazolam and Topamax  Continue antibiotics - I sent referral to dentist  Will trial adding Wellbutrin to Prozac for mood  Get thyroid labs ASAP!

## 2019-04-14 ENCOUNTER — Telehealth: Payer: Self-pay

## 2019-04-14 ENCOUNTER — Encounter: Payer: Self-pay | Admitting: Osteopathic Medicine

## 2019-04-14 MED ORDER — CLINDAMYCIN HCL 300 MG PO CAPS: 300.0000 mg | ORAL_CAPSULE | Freq: Three times a day (TID) | ORAL | 0 refills | Status: DC

## 2019-04-14 NOTE — Telephone Encounter (Signed)
Pt left a vm msg stating that the antibiotics she is taking for a dental abscess is not working. Requesting if provider can send in an alternative antibiotic to [facility]. Pls advise, thanks.

## 2019-04-14 NOTE — Telephone Encounter (Signed)
MyChart message sent: Added clindamycin to cover to anaerobic bacteria but if thi isn't helping needs to seek emergency attention to rule out sepsis, determine if abscess needs to be drained, or evaluate need for IV antibiotics

## 2019-04-14 NOTE — Telephone Encounter (Signed)
Pt left a second vm msg this afternoon. Requesting dental referral. Pt states she is not doing any better with dental abscess. She continues to have pain and feels that current antibiotic rx is not strong enough. Pt stated she apologizes for the multiple calls. She is worry due to the holiday weekend. Pls advise, thanks.

## 2019-04-15 MED ORDER — HYDROCODONE-ACETAMINOPHEN 5-325 MG PO TABS: 1.0000 | ORAL_TABLET | Freq: Three times a day (TID) | ORAL | 0 refills | Status: DC | PRN

## 2019-04-15 NOTE — Telephone Encounter (Signed)
..  PDMP reviewed during this encounter. Sent norco refill due to persistent dental pain/abscess.

## 2019-04-22 ENCOUNTER — Ambulatory Visit (HOSPITAL_COMMUNITY)
Admission: EM | Admit: 2019-04-22 | Discharge: 2019-04-22 | Disposition: A | Discharge status: Home / Self Care | Payer: BC Managed Care – PPO | Attending: Family Medicine | Admitting: Family Medicine

## 2019-04-22 ENCOUNTER — Other Ambulatory Visit: Payer: Self-pay

## 2019-04-22 ENCOUNTER — Encounter (HOSPITAL_COMMUNITY): Payer: Self-pay | Admitting: Emergency Medicine

## 2019-04-22 DIAGNOSIS — K0889 Other specified disorders of teeth and supporting structures: Secondary | ICD-10-CM

## 2019-04-22 DIAGNOSIS — T4145XA Adverse effect of unspecified anesthetic, initial encounter: Secondary | ICD-10-CM

## 2019-04-22 DIAGNOSIS — T8859XA Other complications of anesthesia, initial encounter: Secondary | ICD-10-CM

## 2019-04-22 MED ORDER — HYDROCODONE-ACETAMINOPHEN 5-325 MG PO TABS: 1.0000 | ORAL_TABLET | Freq: Four times a day (QID) | ORAL | 0 refills | Status: DC | PRN

## 2019-04-22 NOTE — ED Triage Notes (Addendum)
Pt reports having a tooth extracted earlier today.  Pt is here because she is still having pain and she states she had a bad reaction to her local numbing medication today.  She states she had flushing and tachycardia when given the numbing medicine and then she states she had a syncopal episode after the procedure was over in the dentists office.  Pt states she went home and has been feeling flush and tachycardic and very lightheaded since then.  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

## 2019-04-22 NOTE — Discharge Instructions (Addendum)
Be aware, pain medications may cause drowsiness. Please do not drive, operate heavy machinery or make important decisions while on this medication, it can cloud your judgement.  

## 2019-04-26 NOTE — ED Provider Notes (Signed)
[facility]   PF:[id number] 04/22/19 Arrival Time: [place]:  1. Pain, dental   2. Complication of anesthesia, initial encounter     No sign of infection. ECG with NSR. Reviewed and interpreted by me.  Meds ordered this encounter  Medications  . HYDROcodone-acetaminophen (NORCO/VICODIN) 5-325 MG tablet    Sig: Take 1 tablet by mouth every 6 (six) hours as needed for severe pain.    Dispense:  10 tablet    Refill:  0    [place] Controlled Substances Registry consulted for this patient. I feel the risk/benefit ratio today is favorable for proceeding with this prescription for a controlled substance. Medication sedation precautions given.  Dental resource written instructions given. She will schedule dental evaluation as soon as possible if not improving over the next 24-48 hours.  Reviewed expectations re: course of current medical issues. Questions answered. Outlined signs and symptoms indicating need for more acute intervention. Patient verbalized understanding. After Visit Summary given.   SUBJECTIVE:  [name] is a 49 y.o. female who reports that she had a tooth extracted earlier today. Still having pain and questions rxn to local anesthetic. Reports flushing and tachycardia when given the numbing medicine. Also states that she passed out before leaving dental office; none since. Ambulatory without difficulty. Has had further short-lived episodes of tachycardia since. No n/v. No SOB.  ROS: As per HPI.  OBJECTIVE: Vitals:   04/22/19 1913  BP: 130/88  Pulse: 75  Resp: 18  Temp: 98.4 F (36.9 C)  TempSrc: Oral  SpO2: 100%    General appearance: alert; no distress HENT: normocephalic; atraumatic; dentition: fair; site of upper R molar removal appears good; no bleeding; normal jaw opening/closing Neck: supple without LAD; FROM; trachea midline CV: RRR without murmer Lungs: normal respirations; unlabored Skin: warm and dry Ext: no  edema Psychological: alert and cooperative; normal mood and affect   Allergies  Allergen Reactions  . Reglan [Metoclopramide] Other (See Comments)    Caused a dystonic reaction.  . Ciprofloxacin Nausea And Vomiting  . Clarithromycin Hives  . Moxifloxacin Hives    Past Medical History:  Diagnosis Date  . Adenomyosis 2013  . Anemia   . Anemia, iron deficiency 12/23/2011  . Anxiety   . Asthma   . Cancer of appendix ([place])   . Complication of anesthesia    scoline pain? from use of Succinylcholine   . Concussion syndrome 07/11/2016  . Cough   . Depression   . Endometriosis   . Generalized headaches   . GERD (gastroesophageal reflux disease)   . Heart murmur   . History of sinus surgery 2010   MAXILLARY, ETHMOID, SPHENOID  . Hypothyroidism   . Iron deficiency anemia   . Kidney stones   . Migraine   . Obesity, Class III, BMI 40-49.9 (morbid obesity) ([place])   . SVD (spontaneous vaginal delivery)    x3  . Vitamin D deficiency 02/02/2017   Social History   Socioeconomic History  . Marital status: Married    Spouse name: Not on file  . Number of children: 3  . Years of education: Not on file  . Highest education level: Not on file  Occupational History  . Occupation: Programmer, multimedia:   Tobacco Use  . Smoking status: Former Smoker    Packs/day: 0.75    Years: 1.00    Pack years: 0.75    Types: Cigarettes    Quit date: 2017  Years since quitting: 4.0  . Smokeless tobacco: Never Used  Substance and Sexual Activity  . Alcohol use: No  . Drug use: No  . Sexual activity: Yes    Partners: Male    Birth control/protection: Surgical  Other Topics Concern  . Not on file  Social History Narrative  . Not on file   Social Determinants of Health   Financial Resource Strain:   . Difficulty of Paying Living Expenses: Not on file  Food Insecurity:   . Worried About Charity fundraiser in the Last Year: Not on file  . Ran Out of Food in the Last Year: Not on  file  Transportation Needs:   . Lack of Transportation (Medical): Not on file  . Lack of Transportation (Non-Medical): Not on file  Physical Activity:   . Days of Exercise per Week: Not on file  . Minutes of Exercise per Session: Not on file  Stress:   . Feeling of Stress : Not on file  Social Connections:   . Frequency of Communication with Friends and Family: Not on file  . Frequency of Social Gatherings with Friends and Family: Not on file  . Attends Religious Services: Not on file  . Active Member of Clubs or Organizations: Not on file  . Attends Archivist Meetings: Not on file  . Marital Status: Not on file  Intimate Partner Violence:   . Fear of Current or Ex-Partner: Not on file  . Emotionally Abused: Not on file  . Physically Abused: Not on file  . Sexually Abused: Not on file   Family History  Problem Relation Age of Onset  . Lung cancer Father   . Alcohol abuse Father   . Depression Mother   . Asthma Mother   . Other Mother   . Depression Sister   . Suicidality Brother   . Depression Brother   . Alcohol abuse Brother   . Drug abuse Brother   . Depression Sister   . Colon cancer Paternal Grandmother   . Asthma Sister   . Anesthesia problems Neg Hx    Past Surgical History:  Procedure Laterality Date  . ABDOMINAL HYSTERECTOMY    . CHOLECYSTECTOMY  01/27/2012   Procedure: LAPAROSCOPIC CHOLECYSTECTOMY;  Surgeon: Harl Bowie, MD;  Location: [facility];  Service: General;  Laterality: N/A;  Laparoscopic chole  . COLPOSCOPY  2007  . KNEE SURGERY     right, scope and ACL repair  . LAPAROSCOPIC APPENDECTOMY  01/02/2012   Procedure: APPENDECTOMY LAPAROSCOPIC;  Surgeon: Harl Bowie, MD;  Location: [facility];  Service: General;  Laterality: N/A;  . LAPAROSCOPIC ASSISTED VAGINAL HYSTERECTOMY  11/13/2011   Procedure: LAPAROSCOPIC ASSISTED VAGINAL HYSTERECTOMY;  Surgeon: Darlyn Chamber, MD;  Location: [facility];  Service: Gynecology;  Laterality: N/A;  .  LAPAROSCOPY  05/30/2011   Procedure: LAPAROSCOPY OPERATIVE;  Surgeon: Darlyn Chamber, MD;  Location: [facility];  Service: Gynecology;  Laterality: N/A;  YAG  LASER of Endometriosis.  Marland Kitchen NASAL SINUS SURGERY  2010  . PARTIAL COLECTOMY  01/27/2012     Vanessa Kick, MD 04/27/19 [phone]

## 2019-04-28 LAB — TSH: TSH: 3.69 mIU/L

## 2019-04-29 ENCOUNTER — Other Ambulatory Visit: Payer: Self-pay | Admitting: Physician Assistant

## 2019-04-29 DIAGNOSIS — E063 Autoimmune thyroiditis: Secondary | ICD-10-CM

## 2019-04-29 MED ORDER — LEVOTHYROXINE SODIUM 112 MCG PO TABS: 112.0000 ug | ORAL_TABLET | Freq: Every day | ORAL | 0 refills | Status: DC

## 2019-05-03 ENCOUNTER — Encounter: Payer: Self-pay | Admitting: Osteopathic Medicine

## 2019-05-03 ENCOUNTER — Ambulatory Visit (INDEPENDENT_AMBULATORY_CARE_PROVIDER_SITE_OTHER): Payer: BC Managed Care – PPO | Admitting: Osteopathic Medicine

## 2019-05-03 VITALS — Temp 99.0°F

## 2019-05-03 DIAGNOSIS — T50905A Adverse effect of unspecified drugs, medicaments and biological substances, initial encounter: Secondary | ICD-10-CM

## 2019-05-03 DIAGNOSIS — Z8719 Personal history of other diseases of the digestive system: Secondary | ICD-10-CM

## 2019-05-03 DIAGNOSIS — T3695XA Adverse effect of unspecified systemic antibiotic, initial encounter: Secondary | ICD-10-CM

## 2019-05-03 DIAGNOSIS — K521 Toxic gastroenteritis and colitis: Secondary | ICD-10-CM | POA: Diagnosis not present

## 2019-05-03 DIAGNOSIS — R112 Nausea with vomiting, unspecified: Secondary | ICD-10-CM | POA: Diagnosis not present

## 2019-05-03 MED ORDER — PROMETHAZINE HCL 25 MG PO TABS: 12.5000 mg | ORAL_TABLET | Freq: Four times a day (QID) | ORAL | 0 refills | Status: DC | PRN

## 2019-05-03 NOTE — Progress Notes (Signed)
Virtual Visit via phone, virtual visit aborted due to technical difficulties  I connected with      [name] on 05/03/19 at 2:50 PM  by a telemedicine application and verified that I am speaking with the correct person using two identifiers.  Patient is at home I am in office   I discussed the limitations of evaluation and management by telemedicine and the availability of in person appointments. The patient expressed understanding and agreed to proceed.  History of Present Illness: [name] is a 49 y.o. female who would like to discuss diarrhea   Dental pain:  Ongoing since mid-03/2019 Urgent care visit 04/10/19 --> augmentin, naproxen, hydrocodone, see dentist  Virtual visit w/ me 04/12/19 --> no change, needs dentiat  Few phone calls to our office re: abx not working. Added clindamycin 04/14/19, to ER I worse Covering provder refilled 04/15/19 Went to dentist 04/22/19 and tooth was extracted   Diarrhea since antibiotics started abx couple of weeks ago.  Having several bowel movements a day up to maximum 10-15, mostly watery, none are formed. No abdominal pain, N/V. Phenergan is helping.      Observations/Objective: Temp 99 F (37.2 C) (Oral)   LMP 11/02/2011 (Exact Date)  BP Readings from Last 3 Encounters:  04/22/19 130/88  04/10/19 (!) 155/91  02/09/19 126/84   Exam: Normal Speech.  NAD  Lab and Radiology Results No results found for this or any previous visit (from the past 72 hour(s)). No results found.     Assessment and Plan: 49 y.o. female with The primary encounter diagnosis was Antibiotic-associated diarrhea. Diagnoses of History of intestinal malabsorption and Drug-induced nausea and vomiting were also pertinent to this visit.   PDMP not reviewed this encounter. Orders Placed This Encounter  Procedures  . Stool Culture  . C. difficile GDH and Toxin A/B   Meds ordered this encounter  Medications  . promethazine  (PHENERGAN) 25 MG tablet    Sig: Take 0.5-1 tablets (12.5-25 mg total) by mouth every 6 (six) hours as needed for nausea or vomiting.    Dispense:  90 tablet    Refill:  0   There are no Patient Instructions on file for this visit.   Follow Up Instructions: Return for RECHECK PENDING RESULTS / IF WORSE OR CHANGE.    I discussed the assessment and treatment plan with the patient. The patient was provided an opportunity to ask questions and all were answered. The patient agreed with the plan and demonstrated an understanding of the instructions.   The patient was advised to call back or seek an in-person evaluation if any new concerns, if symptoms worsen or if the condition fails to improve as anticipated.  21 minutes of non-face-to-face time was provided during this encounter.      . . . . . . . . . . . . . Marland Kitchen                   Historical information moved to improve visibility of documentation.  Past Medical History:  Diagnosis Date  . Adenomyosis 2013  . Anemia   . Anemia, iron deficiency 12/23/2011  . Anxiety   . Asthma   . Cancer of appendix ([place])   . Complication of anesthesia    scoline pain? from use of Succinylcholine   . Concussion syndrome 07/11/2016  . Cough   . Depression   . Endometriosis   . Generalized headaches   . GERD (gastroesophageal  reflux disease)   . Heart murmur   . History of sinus surgery 2010   MAXILLARY, ETHMOID, SPHENOID  . Hypothyroidism   . Iron deficiency anemia   . Kidney stones   . Migraine   . Obesity, Class III, BMI 40-49.9 (morbid obesity) ([place])   . SVD (spontaneous vaginal delivery)    x3  . Vitamin D deficiency 02/02/2017   Past Surgical History:  Procedure Laterality Date  . ABDOMINAL HYSTERECTOMY    . CHOLECYSTECTOMY  01/27/2012   Procedure: LAPAROSCOPIC CHOLECYSTECTOMY;  Surgeon: Harl Bowie, MD;  Location: [facility];  Service: General;  Laterality: N/A;  Laparoscopic chole  . COLPOSCOPY   2007  . KNEE SURGERY     right, scope and ACL repair  . LAPAROSCOPIC APPENDECTOMY  01/02/2012   Procedure: APPENDECTOMY LAPAROSCOPIC;  Surgeon: Harl Bowie, MD;  Location: [facility];  Service: General;  Laterality: N/A;  . LAPAROSCOPIC ASSISTED VAGINAL HYSTERECTOMY  11/13/2011   Procedure: LAPAROSCOPIC ASSISTED VAGINAL HYSTERECTOMY;  Surgeon: Darlyn Chamber, MD;  Location: [facility];  Service: Gynecology;  Laterality: N/A;  . LAPAROSCOPY  05/30/2011   Procedure: LAPAROSCOPY OPERATIVE;  Surgeon: Darlyn Chamber, MD;  Location: [facility];  Service: Gynecology;  Laterality: N/A;  YAG  LASER of Endometriosis.  Marland Kitchen NASAL SINUS SURGERY  2010  . PARTIAL COLECTOMY  01/27/2012   Social History   Tobacco Use  . Smoking status: Former Smoker    Packs/day: 0.75    Years: 1.00    Pack years: 0.75    Types: Cigarettes    Quit date: 2017    Years since quitting: 4.0  . Smokeless tobacco: Never Used  Substance Use Topics  . Alcohol use: No   family history includes Alcohol abuse in her brother and father; Asthma in her mother and sister; Colon cancer in her paternal grandmother; Depression in her brother, mother, sister, and sister; Drug abuse in her brother; Lung cancer in her father; Other in her mother; Suicidality in her brother.  Medications: Current Outpatient Medications  Medication Sig Dispense Refill  . albuterol (VENTOLIN HFA) 108 (90 Base) MCG/ACT inhaler Inhale 1-2 puffs into the lungs every 4 (four) hours as needed for wheezing or shortness of breath. 18 g 3  . ALPRAZolam (XANAX) 0.5 MG tablet Take 0.5-1 tablets (0.25-0.5 mg total) by mouth daily as needed for anxiety (panic). 30 tablet 2  . buPROPion (WELLBUTRIN XL) 150 MG 24 hr tablet Take 1 tablet (150 mg total) by mouth every morning. 90 tablet 0  . FLUoxetine (PROZAC) 20 MG capsule Take 3 capsules (60 mg total) by mouth at bedtime. 270 capsule 1  . HYDROcodone-acetaminophen (NORCO/VICODIN) 5-325 MG tablet Take 1 tablet by mouth every 6  (six) hours as needed for severe pain. 10 tablet 0  . levothyroxine (EUTHYROX) 112 MCG tablet Take 1 tablet (112 mcg total) by mouth daily. [place].MUST SCHEDULE/KEEP APPOINTMENT AND GET LABS DONE 30 tablet 0  . methocarbamol (ROBAXIN) 750 MG tablet TAKE 1/2-1 TABLET BY MOUTH  TWICE DAILY AS NEEDED FOR MUSCLE SPASMS 30 tablet 0  . naproxen (NAPROSYN) 500 MG tablet Take 1 tablet (500 mg total) by mouth 2 (two) times daily. 30 tablet 0  . naratriptan (AMERGE) 2.5 MG tablet Take 1 tab twice a day for 2 days, then 1 tab a day for 2 days, the 1/2 tab a day for 2 days then off 7 tablet 0  . omeprazole (PRILOSEC) 20 MG capsule Take 1-2 capsules (20-40  mg total) by mouth daily. 180 capsule 1  . ondansetron (ZOFRAN-ODT) 4 MG disintegrating tablet Take 1 tablet (4 mg total) by mouth every 8 (eight) hours as needed for nausea or vomiting. 10 tablet 0  . Probiotic Product (PROBIOTIC PO) Take 1 tablet by mouth daily.     . promethazine (PHENERGAN) 25 MG tablet Take 0.5-1 tablets (12.5-25 mg total) by mouth every 6 (six) hours as needed for nausea or vomiting. 90 tablet 0  . topiramate (TOPAMAX) 50 MG tablet Take 1 tablet (50 mg total) by mouth 2 (two) times daily. 180 tablet 3   No current facility-administered medications for this visit.   Allergies  Allergen Reactions  . Reglan [Metoclopramide] Other (See Comments)    Caused a dystonic reaction.  . Cetacaine [Butamben-Tetracaine-Benzocaine] Nausea And Vomiting  . Ciprofloxacin Nausea And Vomiting  . Clarithromycin Hives  . Moxifloxacin Hives

## 2019-05-04 ENCOUNTER — Ambulatory Visit (INDEPENDENT_AMBULATORY_CARE_PROVIDER_SITE_OTHER): Payer: BC Managed Care – PPO | Admitting: Osteopathic Medicine

## 2019-05-04 ENCOUNTER — Encounter: Payer: Self-pay | Admitting: Osteopathic Medicine

## 2019-05-04 DIAGNOSIS — Z87898 Personal history of other specified conditions: Secondary | ICD-10-CM | POA: Insufficient documentation

## 2019-05-04 NOTE — Progress Notes (Signed)
    Procedures performed today:    20-gauge angiocatheter placed in the right dorsal venous plexus of the hand.  This was flushed with 10 cc of sterile saline with good flow.  Independent interpretation of tests performed by another provider:   None.  Impression and Recommendations:    History of difficult venous access [place] is, I am called for expertise regarding venous access. I placed a 20-gauge angiocatheter, further fluid management per primary treating provider.    ___________________________________________ Gwen Her. Dianah Field, M.D., ABFM., CAQSM. Primary Care and [place] Instructor of [place] [facility]

## 2019-05-04 NOTE — Assessment & Plan Note (Signed)
[name] is, I am called for expertise regarding venous access. I placed a 20-gauge angiocatheter, further fluid management per primary treating provider.

## 2019-05-05 ENCOUNTER — Other Ambulatory Visit: Payer: Self-pay | Admitting: Physician Assistant

## 2019-05-05 ENCOUNTER — Encounter: Payer: Self-pay | Admitting: Osteopathic Medicine

## 2019-05-05 DIAGNOSIS — M791 Myalgia, unspecified site: Secondary | ICD-10-CM

## 2019-05-06 ENCOUNTER — Other Ambulatory Visit: Payer: Self-pay | Admitting: Physician Assistant

## 2019-05-06 ENCOUNTER — Telehealth: Payer: BC Managed Care – PPO | Admitting: Osteopathic Medicine

## 2019-05-06 DIAGNOSIS — M791 Myalgia, unspecified site: Secondary | ICD-10-CM

## 2019-05-06 MED ORDER — METHOCARBAMOL 750 MG PO TABS: 750.0000 mg | ORAL_TABLET | Freq: Two times a day (BID) | ORAL | 3 refills | Status: DC | PRN

## 2019-05-06 NOTE — Progress Notes (Signed)
Patient was evaluated yesterday via virtual visit for persistent diarrhea.  She is here today with her daughter for her appointment, requesting IV fluids.  1 L of normal saline was administered and patient felt significant improvement.

## 2019-05-08 LAB — STOOL CULTURE
MICRO NUMBER:: 10058582
MICRO NUMBER:: 10058583
MICRO NUMBER:: 10058584
SHIGA RESULT:: NOT DETECTED
SPECIMEN QUALITY:: ADEQUATE
SPECIMEN QUALITY:: ADEQUATE
SPECIMEN QUALITY:: ADEQUATE

## 2019-05-08 LAB — C. DIFFICILE GDH AND TOXIN A/B
GDH ANTIGEN: NOT DETECTED
MICRO NUMBER:: 10057882
SPECIMEN QUALITY:: ADEQUATE
TOXIN A AND B: NOT DETECTED

## 2019-05-31 ENCOUNTER — Other Ambulatory Visit: Payer: Self-pay | Admitting: Osteopathic Medicine

## 2019-05-31 DIAGNOSIS — E038 Other specified hypothyroidism: Secondary | ICD-10-CM

## 2019-06-11 ENCOUNTER — Emergency Department (HOSPITAL_COMMUNITY): Payer: BC Managed Care – PPO

## 2019-06-11 ENCOUNTER — Encounter (HOSPITAL_COMMUNITY): Payer: Self-pay | Admitting: Emergency Medicine

## 2019-06-11 ENCOUNTER — Emergency Department (HOSPITAL_COMMUNITY)
Admission: EM | Admit: 2019-06-11 | Discharge: 2019-06-11 | Disposition: A | Discharge status: Home / Self Care | Payer: BC Managed Care – PPO | Attending: Emergency Medicine | Admitting: Emergency Medicine

## 2019-06-11 ENCOUNTER — Other Ambulatory Visit: Payer: Self-pay

## 2019-06-11 DIAGNOSIS — J45909 Unspecified asthma, uncomplicated: Secondary | ICD-10-CM | POA: Insufficient documentation

## 2019-06-11 DIAGNOSIS — E039 Hypothyroidism, unspecified: Secondary | ICD-10-CM | POA: Insufficient documentation

## 2019-06-11 DIAGNOSIS — R0789 Other chest pain: Secondary | ICD-10-CM | POA: Insufficient documentation

## 2019-06-11 DIAGNOSIS — Z87891 Personal history of nicotine dependence: Secondary | ICD-10-CM | POA: Insufficient documentation

## 2019-06-11 DIAGNOSIS — Z79899 Other long term (current) drug therapy: Secondary | ICD-10-CM | POA: Diagnosis not present

## 2019-06-11 LAB — COMPREHENSIVE METABOLIC PANEL
ALT: 26 U/L (ref 0–44)
AST: 23 U/L (ref 15–41)
Albumin: 4.2 g/dL (ref 3.5–5.0)
Alkaline Phosphatase: 74 U/L (ref 38–126)
Anion gap: 11 (ref 5–15)
BUN: 18 mg/dL (ref 6–20)
CO2: 24 mmol/L (ref 22–32)
Calcium: 9.2 mg/dL (ref 8.9–10.3)
Chloride: 103 mmol/L (ref 98–111)
Creatinine, Ser: 0.84 mg/dL (ref 0.44–1.00)
GFR calc Af Amer: >60 mL/min (ref >60)
GFR calc non Af Amer: >60 mL/min (ref >60)
Glucose, Bld: 114 mg/dL — ABNORMAL HIGH (ref 70–99)
Potassium: 3.9 mmol/L (ref 3.5–5.1)
Sodium: 138 mmol/L (ref 135–145)
Total Bilirubin: 0.5 mg/dL (ref 0.3–1.2)
Total Protein: 7.7 g/dL (ref 6.5–8.1)

## 2019-06-11 LAB — CBC WITH DIFFERENTIAL/PLATELET
Abs Immature Granulocytes: 0.02 10*3/uL (ref 0.00–0.07)
Basophils Absolute: 0.1 10*3/uL (ref 0.0–0.1)
Basophils Relative: 1 %
Eosinophils Absolute: 0.5 10*3/uL (ref 0.0–0.5)
Eosinophils Relative: 8 %
HCT: 41.2 % (ref 36.0–46.0)
Hemoglobin: 13.5 g/dL (ref 12.0–15.0)
Immature Granulocytes: 0 %
Lymphocytes Relative: 30 %
Lymphs Abs: 2 10*3/uL (ref 0.7–4.0)
MCH: 31.4 pg (ref 26.0–34.0)
MCHC: 32.8 g/dL (ref 30.0–36.0)
MCV: 95.8 fL (ref 80.0–100.0)
Monocytes Absolute: 0.5 10*3/uL (ref 0.1–1.0)
Monocytes Relative: 7 %
Neutro Abs: 3.7 10*3/uL (ref 1.7–7.7)
Neutrophils Relative %: 54 %
Platelets: 261 10*3/uL (ref 150–400)
RBC: 4.3 MIL/uL (ref 3.87–5.11)
RDW: 12.8 % (ref 11.5–15.5)
WBC: 6.8 10*3/uL (ref 4.0–10.5)
nRBC: 0 % (ref 0.0–0.2)

## 2019-06-11 LAB — LIPASE, BLOOD: Lipase: 30 U/L (ref 11–51)

## 2019-06-11 LAB — TROPONIN I (HIGH SENSITIVITY)
Troponin I (High Sensitivity): <2 ng/L (ref <18)
Troponin I (High Sensitivity): <2 ng/L (ref <18)

## 2019-06-11 LAB — D-DIMER, QUANTITATIVE: D-Dimer, Quant: 0.31 ug/mL-FEU (ref 0.00–0.50)

## 2019-06-11 MED ORDER — DIAZEPAM 5 MG PO TABS
5.0000 mg | ORAL_TABLET | Freq: Once | ORAL | Status: AC
  Administered 2019-06-11: 5 mg via ORAL
  Filled 2019-06-11: qty 1

## 2019-06-11 MED ORDER — MORPHINE SULFATE (PF) 4 MG/ML IV SOLN
4.0000 mg | Freq: Once | INTRAVENOUS | Status: AC
  Administered 2019-06-11: 4 mg via INTRAVENOUS
  Filled 2019-06-11: qty 1

## 2019-06-11 MED ORDER — KETOROLAC TROMETHAMINE 30 MG/ML IJ SOLN
30.0000 mg | Freq: Once | INTRAMUSCULAR | Status: AC
  Administered 2019-06-11: 13:00:00 30 mg via INTRAVENOUS
  Filled 2019-06-11: qty 1

## 2019-06-11 MED ORDER — IOHEXOL 350 MG/ML SOLN
100.0000 mL | Freq: Once | INTRAVENOUS | Status: AC | PRN
  Administered 2019-06-11: 130 mL via INTRAVENOUS

## 2019-06-11 MED ORDER — ONDANSETRON HCL 4 MG/2ML IJ SOLN
4.0000 mg | Freq: Once | INTRAMUSCULAR | Status: AC
  Administered 2019-06-11: 4 mg via INTRAVENOUS
  Filled 2019-06-11: qty 2

## 2019-06-11 MED ORDER — NAPROXEN 500 MG PO TABS: 500.0000 mg | ORAL_TABLET | Freq: Two times a day (BID) | ORAL | 0 refills | Status: DC | PRN

## 2019-06-11 MED ORDER — CYCLOBENZAPRINE HCL 5 MG PO TABS: 10.0000 mg | ORAL_TABLET | Freq: Two times a day (BID) | ORAL | 0 refills | Status: DC | PRN
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
## 2019-06-11 NOTE — ED Notes (Signed)
Pt was able to ambulate from wheelchair to bed without any assistance.  Pt complained of pain on left side that radiates to her back. Pt had ice pack on back. Tech took off ice packs and placed heat packs on pts back with a towel in between the hot pack and pts skin. Pt stated that the heat helps better than the ice.

## 2019-06-11 NOTE — ED Triage Notes (Signed)
Pain in left side of chest started a few days ago, now radiating to back, hurts to breathe, "takes my breath away"

## 2019-06-11 NOTE — Discharge Instructions (Signed)
There is no evidence of heart attack or blood clot in the lung.  Take the anti-inflammatories and use heat as prescribed.  You may use Flexeril as needed for muscle spasm but do not drive or operate machine-year-old taking this medication.  Do not take Flexeril and Robaxin together.  Return to the ED if you develop new or worsening symptoms.

## 2019-06-13 ENCOUNTER — Encounter (HOSPITAL_COMMUNITY): Payer: Self-pay

## 2019-06-13 ENCOUNTER — Ambulatory Visit (HOSPITAL_COMMUNITY)
Admission: EM | Admit: 2019-06-13 | Discharge: 2019-06-13 | Disposition: A | Discharge status: Home / Self Care | Payer: BC Managed Care – PPO | Attending: Emergency Medicine | Admitting: Emergency Medicine

## 2019-06-13 ENCOUNTER — Other Ambulatory Visit: Payer: Self-pay

## 2019-06-13 DIAGNOSIS — M62830 Muscle spasm of back: Secondary | ICD-10-CM | POA: Diagnosis not present

## 2019-06-13 MED ORDER — KETOROLAC TROMETHAMINE 60 MG/2ML IM SOLN
INTRAMUSCULAR | Status: AC
  Filled 2019-06-13: qty 2

## 2019-06-13 MED ORDER — KETOROLAC TROMETHAMINE 60 MG/2ML IM SOLN
60.0000 mg | Freq: Once | INTRAMUSCULAR | Status: AC
  Administered 2019-06-13: 60 mg via INTRAMUSCULAR

## 2019-06-13 MED ORDER — PREDNISONE 10 MG (21) PO TBPK: ORAL_TABLET | Freq: Every day | ORAL | 0 refills | Status: DC
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
## 2019-06-13 NOTE — ED Triage Notes (Addendum)
Pt state she has back pain. X 3 days. Pt state she has been seen in the ED for this and given prescription meds. Pt state she needs a work note. Pt states its back spasms.

## 2019-06-13 NOTE — Discharge Instructions (Signed)
Pt will need to rest today and may return to work on thurs.  May take nsaids as needed for pain

## 2019-06-21 ENCOUNTER — Encounter: Payer: BC Managed Care – PPO | Admitting: Sports Medicine

## 2019-08-03 ENCOUNTER — Encounter (HOSPITAL_COMMUNITY): Payer: Self-pay | Admitting: Emergency Medicine

## 2019-08-03 ENCOUNTER — Emergency Department (HOSPITAL_COMMUNITY): Payer: BC Managed Care – PPO

## 2019-08-03 ENCOUNTER — Other Ambulatory Visit: Payer: Self-pay

## 2019-08-03 ENCOUNTER — Emergency Department (HOSPITAL_COMMUNITY)
Admission: EM | Admit: 2019-08-03 | Discharge: 2019-08-03 | Discharge status: Against Medical Advice | Payer: BC Managed Care – PPO | Attending: Emergency Medicine | Admitting: Emergency Medicine

## 2019-08-03 DIAGNOSIS — Z5321 Procedure and treatment not carried out due to patient leaving prior to being seen by health care provider: Secondary | ICD-10-CM | POA: Diagnosis not present

## 2019-08-03 DIAGNOSIS — R079 Chest pain, unspecified: Secondary | ICD-10-CM | POA: Diagnosis present

## 2019-08-03 LAB — TROPONIN I (HIGH SENSITIVITY): Troponin I (High Sensitivity): 3 ng/L (ref <18)

## 2019-08-03 LAB — BASIC METABOLIC PANEL
Anion gap: 10 (ref 5–15)
BUN: 14 mg/dL (ref 6–20)
CO2: 26 mmol/L (ref 22–32)
Calcium: 9.6 mg/dL (ref 8.9–10.3)
Chloride: 104 mmol/L (ref 98–111)
Creatinine, Ser: 0.91 mg/dL (ref 0.44–1.00)
GFR calc Af Amer: >60 mL/min (ref >60)
GFR calc non Af Amer: >60 mL/min (ref >60)
Glucose, Bld: 126 mg/dL — ABNORMAL HIGH (ref 70–99)
Potassium: 4.1 mmol/L (ref 3.5–5.1)
Sodium: 140 mmol/L (ref 135–145)

## 2019-08-03 LAB — CBC
HCT: 39.7 % (ref 36.0–46.0)
Hemoglobin: 13.2 g/dL (ref 12.0–15.0)
MCH: 31.4 pg (ref 26.0–34.0)
MCHC: 33.2 g/dL (ref 30.0–36.0)
MCV: 94.5 fL (ref 80.0–100.0)
Platelets: 276 10*3/uL (ref 150–400)
RBC: 4.2 MIL/uL (ref 3.87–5.11)
RDW: 12.8 % (ref 11.5–15.5)
WBC: 11.4 10*3/uL — ABNORMAL HIGH (ref 4.0–10.5)
nRBC: 0 % (ref 0.0–0.2)

## 2019-08-03 MED ORDER — SODIUM CHLORIDE 0.9% FLUSH: 3.0000 mL | Freq: Once | INTRAVENOUS | Status: DC

## 2019-08-03 NOTE — ED Notes (Signed)
Unable to locate in waiting room

## 2019-08-03 NOTE — ED Triage Notes (Signed)
Per EMS, pt from home states she was woken from sleep w/ crushing left sided chest pain that radiates to her left arm.  She took 325 ASA prior to EMS arrival as well as her anxiety medications, no relief.  Pt stated she felt it was a "vegal response as I was cold/clamy and had instant diarrhea."    104/65 70pulse 100% RA

## 2019-08-03 NOTE — ED Notes (Signed)
No answer for vitals and labs x1

## 2019-10-20 ENCOUNTER — Ambulatory Visit: Payer: BC Managed Care – PPO | Admitting: Family Medicine

## 2019-11-10 ENCOUNTER — Other Ambulatory Visit: Payer: Self-pay

## 2019-11-10 ENCOUNTER — Encounter (HOSPITAL_COMMUNITY): Payer: Self-pay

## 2019-11-10 ENCOUNTER — Ambulatory Visit (HOSPITAL_COMMUNITY)
Admission: EM | Admit: 2019-11-10 | Discharge: 2019-11-10 | Disposition: A | Discharge status: Home / Self Care | Payer: BC Managed Care – PPO | Attending: Family Medicine | Admitting: Family Medicine

## 2019-11-10 DIAGNOSIS — L729 Follicular cyst of the skin and subcutaneous tissue, unspecified: Secondary | ICD-10-CM

## 2019-11-10 DIAGNOSIS — L089 Local infection of the skin and subcutaneous tissue, unspecified: Secondary | ICD-10-CM | POA: Diagnosis not present

## 2019-11-10 MED ORDER — HYDROCODONE-ACETAMINOPHEN 5-325 MG PO TABS: 1.0000 | ORAL_TABLET | Freq: Four times a day (QID) | ORAL | 0 refills | Status: DC | PRN

## 2019-11-10 MED ORDER — LIDOCAINE HCL 2 % IJ SOLN
INTRAMUSCULAR | Status: AC
  Filled 2019-11-10: qty 20

## 2019-11-10 MED ORDER — CEPHALEXIN 500 MG PO CAPS: 500.0000 mg | ORAL_CAPSULE | Freq: Four times a day (QID) | ORAL | 0 refills | Status: DC

## 2019-11-10 NOTE — ED Triage Notes (Signed)
Pt presents with worsening abscess on R breast x 1 week.  Has used warm compresses, Ibuprofen, tried draining at home.  Warm and tender to palpation.

## 2019-11-10 NOTE — Discharge Instructions (Addendum)
This is an infected cyst  We drained here today.  Should heal nicely Keflex for antibiotic coverage.  Hydrocodone for pain as needed  Follow up as needed for continued or worsening symptoms
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
## 2019-12-23 ENCOUNTER — Encounter: Payer: Self-pay | Admitting: Nurse Practitioner

## 2019-12-23 ENCOUNTER — Ambulatory Visit (INDEPENDENT_AMBULATORY_CARE_PROVIDER_SITE_OTHER): Payer: BC Managed Care – PPO | Admitting: Nurse Practitioner

## 2019-12-23 VITALS — BP 130/87 | HR 73 | Temp 98.0°F | Ht 63.0 in | Wt 216.4 lb

## 2019-12-23 DIAGNOSIS — R21 Rash and other nonspecific skin eruption: Secondary | ICD-10-CM | POA: Diagnosis not present

## 2019-12-23 DIAGNOSIS — Z23 Encounter for immunization: Secondary | ICD-10-CM | POA: Diagnosis not present

## 2019-12-23 DIAGNOSIS — N9089 Other specified noninflammatory disorders of vulva and perineum: Secondary | ICD-10-CM | POA: Diagnosis not present

## 2019-12-23 MED ORDER — MUPIROCIN 2 % EX OINT: TOPICAL_OINTMENT | CUTANEOUS | 3 refills | Status: DC

## 2019-12-23 MED ORDER — SULFAMETHOXAZOLE-TRIMETHOPRIM 800-160 MG PO TABS: 1.0000 | ORAL_TABLET | Freq: Two times a day (BID) | ORAL | 0 refills | Status: DC

## 2019-12-23 MED ORDER — TRAMADOL HCL 50 MG PO TABS: ORAL_TABLET | ORAL | 0 refills | Status: DC

## 2019-12-23 MED ORDER — FLUCONAZOLE 150 MG PO TABS: 150.0000 mg | ORAL_TABLET | Freq: Once | ORAL | 1 refills | Status: AC

## 2019-12-23 NOTE — Assessment & Plan Note (Signed)
1.5 cm area of papular erythema noted to the left labia majora.  It does not appear to be vesicular in nature. We will treat today for possible additional abscess formation with oral antibiotics. Discussed the option of viral testing to rule out at HSV.  Patient does not feel this is necessary at this time. Strong suspicion initial abscessed area was most likely due to MRSA infection as opposed to a spider bite as the patient initially believed, especially given a second abscessed area under the breast shortly thereafter. Will utilize oral Bactrim and topical mupirocin ointment twice a day dosing. If symptoms persist plan to reapproach HSV testing. Follow-up if symptoms worsen or fail to improve.

## 2019-12-23 NOTE — Patient Instructions (Signed)
I have sent in bactrim and mupirocin for the infection.  Keep the area clean and dry. If you notice other areas coming up, please let me know.   Skin Abscess  A skin abscess is an infected area on or under your skin that contains a collection of pus and other material. An abscess may also be called a furuncle, carbuncle, or boil. An abscess can occur in or on almost any part of your body. Some abscesses break open (rupture) on their own. Most continue to get worse unless they are treated. The infection can spread deeper into the body and eventually into your blood, which can make you feel ill. Treatment usually involves draining the abscess. What are the causes? An abscess occurs when germs, like bacteria, pass through your skin and cause an infection. This may be caused by:  A scrape or cut on your skin.  A puncture wound through your skin, including a needle injection or insect bite.  Blocked oil or sweat glands.  Blocked and infected hair follicles.  A cyst that forms beneath your skin (sebaceous cyst) and becomes infected. What increases the risk? This condition is more likely to develop in people who:  Have a weak body defense system (immune system).  Have diabetes.  Have dry and irritated skin.  Get frequent injections or use illegal IV drugs.  Have a foreign body in a wound, such as a splinter.  Have problems with their lymph system or veins. What are the signs or symptoms? Symptoms of this condition include:  A painful, firm bump under the skin.  A bump with pus at the top. This may break through the skin and drain. Other symptoms include:  Redness surrounding the abscess site.  Warmth.  Swelling of the lymph nodes (glands) near the abscess.  Tenderness.  A sore on the skin. How is this diagnosed? This condition may be diagnosed based on:  A physical exam.  Your medical history.  A sample of pus. This may be used to find out what is causing the  infection.  Blood tests.  Imaging tests, such as an ultrasound, CT scan, or MRI. How is this treated? A small abscess that drains on its own may not need treatment. Treatment for larger abscesses may include:  Moist heat or heat pack applied to the area several times a day.  A procedure to drain the abscess (incision and drainage).  Antibiotic medicines. For a severe abscess, you may first get antibiotics through an IV and then change to antibiotics by mouth. Follow these instructions at home: Medicines   Take over-the-counter and prescription medicines only as told by your health care provider.  If you were prescribed an antibiotic medicine, take it as told by your health care provider. Do not stop taking the antibiotic even if you start to feel better. Abscess care   If you have an abscess that has not drained, apply heat to the affected area. Use the heat source that your health care provider recommends, such as a moist heat pack or a heating pad. ? Place a towel between your skin and the heat source. ? Leave the heat on for 20-30 minutes. ? Remove the heat if your skin turns bright red. This is especially important if you are unable to feel pain, heat, or cold. You may have a greater risk of getting burned.  Follow instructions from your health care provider about how to take care of your abscess. Make sure you: ? Cover the  abscess with a bandage (dressing). ? Change your dressing or gauze as told by your health care provider. ? Wash your hands with soap and water before you change the dressing or gauze. If soap and water are not available, use hand sanitizer.  Check your abscess every day for signs of a worsening infection. Check for: ? More redness, swelling, or pain. ? More fluid or blood. ? Warmth. ? More pus or a bad smell. General instructions  To avoid spreading the infection: ? Do not share personal care items, towels, or hot tubs with others. ? Avoid making skin  contact with other people.  Keep all follow-up visits as told by your health care provider. This is important. Contact a health care provider if you have:  More redness, swelling, or pain around your abscess.  More fluid or blood coming from your abscess.  Warm skin around your abscess.  More pus or a bad smell coming from your abscess.  A fever.  Muscle aches.  Chills or a general ill feeling. Get help right away if you:  Have severe pain.  See red streaks on your skin spreading away from the abscess. Summary  A skin abscess is an infected area on or under your skin that contains a collection of pus and other material.  A small abscess that drains on its own may not need treatment.  Treatment for larger abscesses may include having a procedure to drain the abscess and taking an antibiotic. This information is not intended to replace advice given to you by your health care provider. Make sure you discuss any questions you have with your health care provider. Document Revised: 07/23/2018 Document Reviewed: 05/15/2017 Elsevier Patient Education  [place].    Influenza (Flu) Vaccine (Inactivated or Recombinant): What You Need to Know 1. Why get vaccinated? Influenza vaccine can prevent influenza (flu). Flu is a contagious disease that spreads around the [place] every year, usually between October and May. Anyone can get the flu, but it is more dangerous for some people. Infants and young children, people 37 years of age and older, pregnant women, and people with certain health conditions or a weakened immune system are at greatest risk of flu complications. Pneumonia, bronchitis, sinus infections and ear infections are examples of flu-related complications. If you have a medical condition, such as heart disease, cancer or diabetes, flu can make it worse. Flu can cause fever and chills, sore throat, muscle aches, fatigue, cough, headache, and runny or stuffy nose.  Some people may have vomiting and diarrhea, though this is more common in children than adults. Each year thousands of people in the [place] die from flu, and many more are hospitalized. Flu vaccine prevents millions of illnesses and flu-related visits to the doctor each year. 2. Influenza vaccine CDC recommends everyone 61 months of age and older get vaccinated every flu season. Children 6 months through 49 years of age may need 2 doses during a single flu season. Everyone else needs only 1 dose each flu season. It takes about 2 weeks for protection to develop after vaccination. There are many flu viruses, and they are always changing. Each year a new flu vaccine is made to protect against three or four viruses that are likely to cause disease in the upcoming flu season. Even when the vaccine doesn't exactly match these viruses, it may still provide some protection. Influenza vaccine does not cause flu. Influenza vaccine may be given at the same time as other  vaccines. 3. Talk with your health care provider Tell your vaccine provider if the person getting the vaccine:  Has had an allergic reaction after a previous dose of influenza vaccine, or has any severe, life-threatening allergies.  Has ever had Guillain-Barr Syndrome (also called GBS). In some cases, your health care provider may decide to postpone influenza vaccination to a future visit. People with minor illnesses, such as a cold, may be vaccinated. People who are moderately or severely ill should usually wait until they recover before getting influenza vaccine. Your health care provider can give you more information. 4. Risks of a vaccine reaction  Soreness, redness, and swelling where shot is given, fever, muscle aches, and headache can happen after influenza vaccine.  There may be a very small increased risk of Guillain-Barr Syndrome (GBS) after inactivated influenza vaccine (the flu shot). Young children who get the flu shot  along with pneumococcal vaccine (PCV13), and/or DTaP vaccine at the same time might be slightly more likely to have a seizure caused by fever. Tell your health care provider if a child who is getting flu vaccine has ever had a seizure. People sometimes faint after medical procedures, including vaccination. Tell your provider if you feel dizzy or have vision changes or ringing in the ears. As with any medicine, there is a very remote chance of a vaccine causing a severe allergic reaction, other serious injury, or death. 5. What if there is a serious problem? An allergic reaction could occur after the vaccinated person leaves the clinic. If you see signs of a severe allergic reaction (hives, swelling of the face and throat, difficulty breathing, a fast heartbeat, dizziness, or weakness), call 9-1-1 and get the person to the nearest hospital. For other signs that concern you, call your health care provider. Adverse reactions should be reported to the Vaccine Adverse Event Reporting System (VAERS). Your health care provider will usually file this report, or you can do it yourself. Visit the VAERS website at www.vaers.SamedayNews.es or call 724-785-9171.VAERS is only for reporting reactions, and VAERS staff do not give medical advice. 6. The National Vaccine Injury Compensation Program The [employer] Vaccine Injury Compensation Program (VICP) is a federal program that was created to compensate people who may have been injured by certain vaccines. Visit the VICP website at GoldCloset.com.ee or call (438)148-7078 to learn about the program and about filing a claim. There is a time limit to file a claim for compensation. 7. How can I learn more?  Ask your healthcare provider.  Call your local or state health department.  Contact the Centers for Disease Control and Prevention (CDC): ? Call 412-520-8345 (1-800-CDC-INFO) or ? Visit CDC's https://gibson.com/ Vaccine Information Statement (Interim)  Inactivated Influenza Vaccine (11/27/2017) This information is not intended to replace advice given to you by your health care provider. Make sure you discuss any questions you have with your health care provider. Document Revised: 07/21/2018 Document Reviewed: 12/01/2017 Elsevier Patient Education  [place].

## 2019-12-23 NOTE — Progress Notes (Signed)
Acute Office Visit  Subjective:    Patient ID: [name], female    DOB: 02-20-1971, 49 y.o.   MRN: [id number]  Chief Complaint  Patient presents with   Labial Pain    HPI Patient is in today for a painful abscess of the left vulvar area after suspected bite from spider while camping in the woods in June of this year. She reports the area became red and inflamed and the abscess formed. She reports the abscess opened and drained on its own and seemed to be healing, but shortly thereafter new small blister-like areas formed. She has been using an incontinent barrier cream to the area and keeping it clean and dry.  She reports the new blister areas started to heal, but then returned in the same location.   She also reports having a second abscess located under her right breast shortly after the first abscess formed.  She had to go to urgent care and have this area lanced and drained.  She denies STI history or new sexual partners.  She denies vaginal discharge or vaginal irritation.  She denies fever, chills, red streak from the area. She denies a history of staphylococcal infections or abscesses other than the current one on the vulva and the one under her breast recently drained by urgent care.  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  She does report that she once tested positive for MRSA in the nares prior to surgery.  Past Medical History:  Diagnosis Date   Adenomyosis 2013   Anemia    Anemia, iron deficiency 12/23/2011   Anxiety    Asthma    Cancer of appendix ([place])    Complication of anesthesia    scoline pain? from use of Succinylcholine    Concussion syndrome 07/11/2016   Cough    Depression    Endometriosis    Generalized headaches    GERD (gastroesophageal reflux disease)    Heart murmur    History of sinus surgery 2010   MAXILLARY, ETHMOID, SPHENOID   Hypothyroidism     Iron deficiency anemia    Kidney stones    Migraine    Obesity, Class III, BMI 40-49.9 (morbid obesity) ([place])    SVD (spontaneous vaginal delivery)    x3   Vitamin D deficiency 02/02/2017    Past Surgical History:  Procedure Laterality Date   ABDOMINAL HYSTERECTOMY     CHOLECYSTECTOMY  01/27/2012   Procedure: LAPAROSCOPIC CHOLECYSTECTOMY;  Surgeon: Harl Bowie, MD;  Location: [facility];  Service: General;  Laterality: N/A;  Laparoscopic chole   COLPOSCOPY  2007   KNEE SURGERY     right, scope and ACL repair   LAPAROSCOPIC APPENDECTOMY  01/02/2012   Procedure: APPENDECTOMY LAPAROSCOPIC;  Surgeon: Harl Bowie, MD;  Location: [facility];  Service: General;  Laterality: N/A;   LAPAROSCOPIC ASSISTED VAGINAL HYSTERECTOMY  11/13/2011   Procedure: LAPAROSCOPIC ASSISTED VAGINAL HYSTERECTOMY;  Surgeon: Darlyn Chamber, MD;  Location: [facility];  Service: Gynecology;  Laterality: N/A;   LAPAROSCOPY  05/30/2011   Procedure: LAPAROSCOPY OPERATIVE;  Surgeon: Darlyn Chamber, MD;  Location: [facility];  Service: Gynecology;  Laterality: N/A;  YAG  LASER of Endometriosis.   NASAL SINUS SURGERY  2010   PARTIAL COLECTOMY  01/27/2012    Family History  Problem Relation Age of Onset   Lung cancer Father    Alcohol abuse Father  Depression Mother    Asthma Mother    Other Mother    Depression Sister    Suicidality Brother    Depression Brother    Alcohol abuse Brother    Drug abuse Brother    Depression Sister    Colon cancer Paternal Grandmother    Asthma Sister    Anesthesia problems Neg Hx     Social History   Socioeconomic History   Marital status: Married    Spouse name: Not on file   Number of children: 3   Years of education: Not on file   Highest education level: Not on file  Occupational History   Occupation: Programmer, multimedia: [employer]  Tobacco Use   Smoking status: Former Smoker    Packs/day: 0.75    Years: 1.00    Pack years: 0.75     Types: Cigarettes    Quit date: 2017    Years since quitting: 4.6   Smokeless tobacco: Never Used  Scientific laboratory technician Use: Never used  Substance and Sexual Activity   Alcohol use: No   Drug use: No   Sexual activity: Yes    Partners: Male    Birth control/protection: Surgical  Other Topics Concern   Not on file  Social History Narrative   Not on file   Social Determinants of Health   Financial Resource Strain:    Difficulty of Paying Living Expenses: Not on file  Food Insecurity:    Worried About Charity fundraiser in the Last Year: Not on file   [employer] of Food in the Last Year: Not on file  Transportation Needs:    Lack of Transportation (Medical): Not on file   Lack of Transportation (Non-Medical): Not on file  Physical Activity:    Days of Exercise per Week: Not on file   Minutes of Exercise per Session: Not on file  Stress:    Feeling of Stress : Not on file  Social Connections:    Frequency of Communication with Friends and Family: Not on file   Frequency of Social Gatherings with Friends and Family: Not on file   Attends Religious Services: Not on file   Active Member of Clubs or Organizations: Not on file   Attends Archivist Meetings: Not on file   Marital Status: Not on file  Intimate Partner Violence:    Fear of Current or Ex-Partner: Not on file   Emotionally Abused: Not on file   Physically Abused: Not on file   Sexually Abused: Not on file    Outpatient Medications Prior to Visit  Medication Sig Dispense Refill   albuterol (VENTOLIN HFA) 108 (90 Base) MCG/ACT inhaler Inhale 1-2 puffs into the lungs every 4 (four) hours as needed for wheezing or shortness of breath. 18 g 3   ALPRAZolam (XANAX) 0.5 MG tablet Take 0.5-1 tablets (0.25-0.5 mg total) by mouth daily as needed for anxiety (panic). 30 tablet 2   FLUoxetine (PROZAC) 40 MG capsule Take 40 mg by mouth daily.     levothyroxine (EUTHYROX) 112 MCG tablet Take  1 tablet (112 mcg total) by mouth daily before breakfast. 90 tablet 3   methocarbamol (ROBAXIN) 750 MG tablet Take 1 tablet (750 mg total) by mouth 2 (two) times daily as needed for muscle spasms. 60 tablet 3   omeprazole (PRILOSEC) 20 MG capsule Take 1-2 capsules (20-40 mg total) by mouth daily. 180 capsule 1   Probiotic Product (PROBIOTIC PO) Take  1 tablet by mouth daily.      topiramate (TOPAMAX) 50 MG tablet Take 1 tablet (50 mg total) by mouth 2 (two) times daily. 180 tablet 3   buPROPion (WELLBUTRIN XL) 150 MG 24 hr tablet Take 1 tablet (150 mg total) by mouth every morning. 90 tablet 0   cephALEXin (KEFLEX) 500 MG capsule Take 1 capsule (500 mg total) by mouth 4 (four) times daily. (Patient not taking: Reported on 12/23/2019) 28 capsule 0   FLUoxetine (PROZAC) 20 MG capsule Take 3 capsules (60 mg total) by mouth at bedtime. 270 capsule 1   HYDROcodone-acetaminophen (NORCO/VICODIN) 5-325 MG tablet Take 1-2 tablets by mouth every 6 (six) hours as needed. (Patient not taking: Reported on 12/23/2019) 10 tablet 0   naratriptan (AMERGE) 2.5 MG tablet Take 1 tab twice a day for 2 days, then 1 tab a day for 2 days, the 1/2 tab a day for 2 days then off 7 tablet 0   promethazine (PHENERGAN) 25 MG tablet Take 0.5-1 tablets (12.5-25 mg total) by mouth every 6 (six) hours as needed for nausea or vomiting. 90 tablet 0   No facility-administered medications prior to visit.    Allergies  Allergen Reactions   Reglan [Metoclopramide] Other (See Comments)    Caused a dystonic reaction.   Cetacaine [Butamben-Tetracaine-Benzocaine] Nausea And Vomiting   Ciprofloxacin Nausea And Vomiting   Clarithromycin Hives   Moxifloxacin Hives       Objective:    Physical Exam Vitals and nursing note reviewed. Exam conducted with a chaperone present.  Constitutional:      Appearance: Normal appearance.  HENT:     Head: Normocephalic.  Eyes:     Extraocular Movements: Extraocular movements intact.       Conjunctiva/sclera: Conjunctivae normal.     Pupils: Pupils are equal, round, and reactive to light.  Cardiovascular:     Rate and Rhythm: Normal rate.     Pulses: Normal pulses.  Pulmonary:     Effort: Pulmonary effort is normal.  Abdominal:     General: Abdomen is flat.     Palpations: Abdomen is soft.  Genitourinary:    Exam position: Lithotomy position.     Labia:        Right: No rash, tenderness, lesion or injury.        Left: Tenderness and lesion present.     Musculoskeletal:        General: Normal range of motion.     Cervical back: Normal range of motion.     Right lower leg: No edema.     Left lower leg: No edema.  Lymphadenopathy:     Lower Body: No right inguinal adenopathy. No left inguinal adenopathy.  Skin:    General: Skin is warm and dry.     Capillary Refill: Capillary refill takes less than 2 seconds.     Findings: Lesion present.  Neurological:     General: No focal deficit present.     Mental Status: She is alert and oriented to person, place, and time.  Psychiatric:        Mood and Affect: Mood normal.        Behavior: Behavior normal.        Thought Content: Thought content normal.        Judgment: Judgment normal.     BP 130/87    Pulse 73    Temp 98 F (36.7 C) (Oral)    Ht 5\' 3"  (1.6 m)  Wt 216 lb 6.4 oz (98.2 kg)    LMP 11/02/2011 (Exact Date)    SpO2 100%    BMI 38.33 kg/m  Wt Readings from Last 3 Encounters:  12/23/19 216 lb 6.4 oz (98.2 kg)  08/03/19 198 lb (89.8 kg)  06/13/19 202 lb (91.6 kg)    There are no preventive care reminders to display for this patient.  There are no preventive care reminders to display for this patient.   Lab Results  Component Value Date   TSH 3.69 04/28/2019   Lab Results  Component Value Date   WBC 11.4 (H) 08/03/2019   HGB 13.2 08/03/2019   HCT 39.7 08/03/2019   MCV 94.5 08/03/2019   PLT 276 08/03/2019   Lab Results  Component Value Date   NA 140 08/03/2019   K 4.1 08/03/2019    CO2 26 08/03/2019   GLUCOSE 126 (H) 08/03/2019   BUN 14 08/03/2019   CREATININE 0.91 08/03/2019   BILITOT 0.5 06/11/2019   ALKPHOS 74 06/11/2019   AST 23 06/11/2019   ALT 26 06/11/2019   PROT 7.7 06/11/2019   ALBUMIN 4.2 06/11/2019   CALCIUM 9.6 08/03/2019   ANIONGAP 10 08/03/2019   GFR 82.74 03/22/2011   Lab Results  Component Value Date   CHOL 212 (H) 09/10/2018   Lab Results  Component Value Date   HDL 68 09/10/2018   Lab Results  Component Value Date   LDLCALC 125 (H) 09/10/2018   Lab Results  Component Value Date   TRIG 93 09/10/2018   Lab Results  Component Value Date   CHOLHDL 3.1 09/10/2018   Lab Results  Component Value Date   HGBA1C 5.5 09/25/2016       Assessment & Plan:   Problem List Items Addressed This Visit      Other   Vulvar lesion - Primary    1.5 cm area of papular erythema noted to the left labia majora.  It does not appear to be vesicular in nature. We will treat today for possible additional abscess formation with oral antibiotics. Discussed the option of viral testing to rule out at HSV.  Patient does not feel this is necessary at this time. Strong suspicion initial abscessed area was most likely due to MRSA infection as opposed to a spider bite as the patient initially believed, especially given a second abscessed area under the breast shortly thereafter. Will utilize oral Bactrim and topical mupirocin ointment twice a day dosing. If symptoms persist plan to reapproach HSV testing. Follow-up if symptoms worsen or fail to improve.        Relevant Medications   sulfamethoxazole-trimethoprim (BACTRIM DS) 800-160 MG tablet   mupirocin ointment (BACTROBAN) 2 %   fluconazole (DIFLUCAN) 150 MG tablet   traMADol (ULTRAM) 50 MG tablet    Other Visit Diagnoses    Need for influenza vaccination       Relevant Orders   Flu Vaccine QUAD 36+ mos IM (Completed)       Meds ordered this encounter  Medications    sulfamethoxazole-trimethoprim (BACTRIM DS) 800-160 MG tablet    Sig: Take 1 tablet by mouth 2 (two) times daily.    Dispense:  10 tablet    Refill:  0   mupirocin ointment (BACTROBAN) 2 %    Sig: Apply to affected area BID for 7 days.    Dispense:  30 g    Refill:  3   DISCONTD: traMADol (ULTRAM) 50 MG tablet    Sig: Take  1-2 tabs (50-100mg ) by mouth every 6 hours as needed for pain.    Dispense:  30 tablet    Refill:  0   fluconazole (DIFLUCAN) 150 MG tablet    Sig: Take 1 tablet (150 mg total) by mouth once for 1 dose.    Dispense:  1 tablet    Refill:  1   traMADol (ULTRAM) 50 MG tablet    Sig: Take 1-2 tabs (50-100mg ) by mouth every 6 hours as needed for pain.    Dispense:  30 tablet    Refill:  0   Follow-up if symptoms worsen or fail to improve.   Orma Render, NP

## 2020-01-13 ENCOUNTER — Other Ambulatory Visit: Payer: BC Managed Care – PPO

## 2020-01-13 ENCOUNTER — Other Ambulatory Visit: Payer: Self-pay

## 2020-01-13 ENCOUNTER — Other Ambulatory Visit: Payer: Self-pay | Admitting: Osteopathic Medicine

## 2020-01-13 DIAGNOSIS — Z20822 Contact with and (suspected) exposure to covid-19: Secondary | ICD-10-CM

## 2020-01-13 DIAGNOSIS — M791 Myalgia, unspecified site: Secondary | ICD-10-CM

## 2020-01-14 LAB — SARS-COV-2, NAA 2 DAY TAT

## 2020-01-14 LAB — NOVEL CORONAVIRUS, NAA: SARS-CoV-2, NAA: NOT DETECTED

## 2020-01-14 NOTE — Telephone Encounter (Addendum)
Medication refill request Last refill 11/13/2019 Last ov- 12/23/2019

## 2020-01-25 ENCOUNTER — Ambulatory Visit: Payer: Self-pay

## 2020-01-25 ENCOUNTER — Emergency Department
Admission: RE | Admit: 2020-01-25 | Discharge: 2020-01-25 | Disposition: A | Discharge status: Home / Self Care | Payer: BC Managed Care – PPO | Source: Ambulatory Visit | Attending: Family Medicine | Admitting: Family Medicine

## 2020-01-25 ENCOUNTER — Other Ambulatory Visit: Payer: Self-pay

## 2020-01-25 VITALS — BP 108/60 | HR 73 | Temp 98.2°F | Resp 17 | Ht 63.0 in | Wt 213.0 lb

## 2020-01-25 DIAGNOSIS — J069 Acute upper respiratory infection, unspecified: Secondary | ICD-10-CM | POA: Diagnosis not present

## 2020-01-25 MED ORDER — PREDNISONE 20 MG PO TABS: ORAL_TABLET | ORAL | 0 refills | Status: DC

## 2020-01-25 MED ORDER — DOXYCYCLINE HYCLATE 100 MG PO CAPS: ORAL_CAPSULE | ORAL | 0 refills | Status: DC

## 2020-01-25 MED ORDER — GUAIFENESIN-CODEINE 100-10 MG/5ML PO SOLN: ORAL | 0 refills | Status: DC

## 2020-01-25 NOTE — Discharge Instructions (Addendum)
Take plain guaifenesin (1200mg  extended release tabs such as Mucinex) twice daily, with plenty of water, for cough and congestion.   Get adequate rest.   May use Afrin nasal spray (or generic oxymetazoline) each morning for about 3 to 5 days and then discontinue.  Also recommend using saline nasal spray several times daily and saline nasal irrigation (AYR is a common brand).  Use Flonase nasal spray each morning after using Afrin nasal spray and saline nasal irrigation. Try warm salt water gargles for sore throat.  Stop all antihistamines (Zyrtec, Nyquil, etc) for now, and other non-prescription cough/cold preparations. Continue albuterol inhaler as needed.  Followup with your family doctor for vitamin D management.
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
## 2020-01-25 NOTE — ED Triage Notes (Signed)
Cough & congestion w/sinus pressure x 2 weeks Negative PCR COVID on 9/30 OTC meds  Mucinex, Dayquil - no relief Unable to take sudafed No fever Sinus congestion

## 2020-01-26 LAB — SARS-COV-2 RNA,(COVID-19) QUALITATIVE NAAT: SARS CoV2 RNA: NOT DETECTED

## 2020-03-21 ENCOUNTER — Encounter: Payer: Self-pay | Admitting: Osteopathic Medicine

## 2020-03-21 ENCOUNTER — Other Ambulatory Visit: Payer: Self-pay

## 2020-03-21 ENCOUNTER — Ambulatory Visit (INDEPENDENT_AMBULATORY_CARE_PROVIDER_SITE_OTHER): Payer: BC Managed Care – PPO | Admitting: Osteopathic Medicine

## 2020-03-21 VITALS — BP 110/63 | HR 69 | Temp 97.8°F | Wt 230.0 lb

## 2020-03-21 DIAGNOSIS — Z23 Encounter for immunization: Secondary | ICD-10-CM

## 2020-03-21 DIAGNOSIS — M791 Myalgia, unspecified site: Secondary | ICD-10-CM

## 2020-03-21 DIAGNOSIS — Z8719 Personal history of other diseases of the digestive system: Secondary | ICD-10-CM

## 2020-03-21 DIAGNOSIS — G43809 Other migraine, not intractable, without status migrainosus: Secondary | ICD-10-CM | POA: Diagnosis not present

## 2020-03-21 DIAGNOSIS — Z0000 Encounter for general adult medical examination without abnormal findings: Secondary | ICD-10-CM

## 2020-03-21 DIAGNOSIS — K2100 Gastro-esophageal reflux disease with esophagitis, without bleeding: Secondary | ICD-10-CM | POA: Diagnosis not present

## 2020-03-21 DIAGNOSIS — Z1231 Encounter for screening mammogram for malignant neoplasm of breast: Secondary | ICD-10-CM

## 2020-03-21 DIAGNOSIS — Z91018 Allergy to other foods: Secondary | ICD-10-CM

## 2020-03-21 MED ORDER — BUTALBITAL-APAP-CAFFEINE 50-325-40 MG PO TABS: 1.0000 | ORAL_TABLET | Freq: Four times a day (QID) | ORAL | 0 refills | Status: AC | PRN

## 2020-03-21 MED ORDER — METHOCARBAMOL 750 MG PO TABS: 750.0000 mg | ORAL_TABLET | Freq: Three times a day (TID) | ORAL | 3 refills | Status: DC | PRN

## 2020-03-21 MED ORDER — TOPIRAMATE 100 MG PO TABS
100.0000 mg | ORAL_TABLET | Freq: Two times a day (BID) | ORAL | 3 refills | Status: DC
Start: 2020-03-21 — End: 2020-12-06

## 2020-03-21 NOTE — Progress Notes (Signed)
[name] is a 49 y.o. female who presents to  [facility]  today, 03/21/20, seeking care for the following:   Mental health - stable Respiratory - stable Hypothyroid - due to recheck labd GERD/IBS, malabsorption syndrome - switched form omeprazole to esomeprazole d/t esophagitis attacks.Concern for nut allergy.  Headache - noting more frequent migraines, is taking topamax 50 mg bid    Wt Readings from Last 3 Encounters:  03/21/20 230 lb (104.3 kg)  01/25/20 213 lb (96.6 kg)  12/23/19 216 lb 6.4 oz (98.2 kg)      ASSESSMENT & PLAN with other pertinent findings:  The primary encounter diagnosis was Other migraine without status migrainosus, not intractable. Diagnoses of Need for shingles vaccine, Myalgia, Annual physical exam, Gastroesophageal reflux disease with esophagitis without hemorrhage, History of intestinal malabsorption, Nut allergy, and Encounter for screening mammogram for malignant neoplasm of breast were also pertinent to this visit.   Migraine breakthroughs possible d/t suboptimal topamax dosing, increased this Rx. Refilled muscle relaxer. Added fioricet for sparing use for breakthrough headaches. Discussed with patient this should be limited to 5 headache days per month   Labs ordered for future visit. Annual physical / preventive care was NOT performed or billed today.   No results found for this or any previous visit (from the past 24 hour(s)).  There are no Patient Instructions on file for this visit.  Orders Placed This Encounter  Procedures   MM 3D SCREEN BREAST BILATERAL   Varicella-zoster vaccine IM (Shingrix)   CBC   COMPLETE METABOLIC PANEL WITH GFR   Lipid panel   TSH   VITAMIN D 25 Hydroxy (Vit-D Deficiency, Fractures)   Vitamin B12   Ambulatory referral to Gastroenterology   Ambulatory referral to Allergy    Meds ordered this encounter  Medications   topiramate  (TOPAMAX) 100 MG tablet    Sig: Take 1 tablet (100 mg total) by mouth 2 (two) times daily.    Dispense:  180 tablet    Refill:  3   methocarbamol (ROBAXIN) 750 MG tablet    Sig: Take 1 tablet (750 mg total) by mouth every 8 (eight) hours as needed for muscle spasms.    Dispense:  90 tablet    Refill:  3   butalbital-acetaminophen-caffeine (FIORICET) 50-325-40 MG tablet    Sig: Take 1-2 tablets by mouth every 6 (six) hours as needed for headache or migraine (max 3 tablets in 24 hours).    Dispense:  15 tablet    Refill:  0       Follow-up instructions: Return in about 6 months (around 09/19/2020) for ANNUAL CHECK-UP - SEE [place] SOONER IF NEEDED.                                         BP 110/63 (BP Location: Left Arm, Patient Position: Sitting, Cuff Size: Large)    Pulse 69    Temp 97.8 F (36.6 C) (Oral)    Wt 230 lb (104.3 kg)    LMP 11/02/2011 (Exact Date)    BMI 40.74 kg/m   Current Meds  Medication Sig   albuterol (VENTOLIN HFA) 108 (90 Base) MCG/ACT inhaler Inhale 1-2 puffs into the lungs every 4 (four) hours as needed for wheezing or shortness of breath.   ALPRAZolam (XANAX) 0.5 MG tablet Take 0.5-1 tablets (0.25-0.5 mg total)  by mouth daily as needed for anxiety (panic).   buPROPion (WELLBUTRIN XL) 150 MG 24 hr tablet Take 150 mg by mouth daily.   esomeprazole (NEXIUM) 20 MG capsule Take 20 mg by mouth daily at 12 noon.   FLUoxetine (PROZAC) 40 MG capsule Take 40 mg by mouth daily.   levothyroxine (EUTHYROX) 112 MCG tablet Take 1 tablet (112 mcg total) by mouth daily before breakfast.   methocarbamol (ROBAXIN) 750 MG tablet Take 1 tablet (750 mg total) by mouth every 8 (eight) hours as needed for muscle spasms.   Probiotic Product (PROBIOTIC PO) Take 1 tablet by mouth daily.    topiramate (TOPAMAX) 100 MG tablet Take 1 tablet (100 mg total) by mouth 2 (two) times daily.   [DISCONTINUED] methocarbamol (ROBAXIN) 750 MG tablet TAKE 1  TABLET BY MOUTH TWICE DAILY AS NEEDED FOR MUSCLE SPASM   [DISCONTINUED] predniSONE (DELTASONE) 20 MG tablet Take one tab by mouth twice daily for 4 days, then one daily for 3 days. Take with food.   [DISCONTINUED] topiramate (TOPAMAX) 50 MG tablet Take 1 tablet (50 mg total) by mouth 2 (two) times daily.    No results found for this or any previous visit (from the past 72 hour(s)).  No results found.     All questions at time of visit were answered - patient instructed to contact office with any additional concerns or updates.  ER/RTC precautions were reviewed with the patient as applicable.   Please note: voice recognition software was used to produce this document, and typos may escape review. Please contact Dr. Sheppard Coil for any needed clarifications.

## 2020-04-10 ENCOUNTER — Other Ambulatory Visit: Payer: Self-pay

## 2020-04-10 ENCOUNTER — Encounter (HOSPITAL_COMMUNITY): Payer: Self-pay

## 2020-04-10 ENCOUNTER — Ambulatory Visit (HOSPITAL_COMMUNITY)
Admission: RE | Admit: 2020-04-10 | Discharge: 2020-04-10 | Disposition: A | Discharge status: Home / Self Care | Payer: BC Managed Care – PPO | Source: Ambulatory Visit | Attending: Family Medicine | Admitting: Family Medicine

## 2020-04-10 VITALS — BP 126/83 | HR 71 | Temp 98.3°F | Resp 18

## 2020-04-10 DIAGNOSIS — R42 Dizziness and giddiness: Secondary | ICD-10-CM | POA: Diagnosis not present

## 2020-04-10 DIAGNOSIS — H6503 Acute serous otitis media, bilateral: Secondary | ICD-10-CM | POA: Diagnosis not present

## 2020-04-10 MED ORDER — PREDNISONE 50 MG PO TABS: ORAL_TABLET | ORAL | 0 refills | Status: DC

## 2020-04-10 MED ORDER — TRAMADOL HCL 50 MG PO TABS: 50.0000 mg | ORAL_TABLET | Freq: Every evening | ORAL | 0 refills | Status: DC | PRN

## 2020-04-10 MED ORDER — MECLIZINE HCL 25 MG PO TABS: 25.0000 mg | ORAL_TABLET | Freq: Three times a day (TID) | ORAL | 0 refills | Status: DC | PRN

## 2020-04-10 MED ORDER — AMOXICILLIN 875 MG PO TABS: 875.0000 mg | ORAL_TABLET | Freq: Two times a day (BID) | ORAL | 0 refills | Status: DC
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
## 2020-04-10 NOTE — ED Triage Notes (Signed)
Patient c/o bilateral ear pain and vertigo x 2 weeks.   Patient endorses that onset of symptoms began after an [place] salt bath at the spa.   Patient endorses increased pain when laying down.   Patient has tried OTC allergy and cold medication w/ no relief of symptoms.

## 2020-04-13 ENCOUNTER — Encounter: Payer: Self-pay | Admitting: Emergency Medicine

## 2020-04-13 ENCOUNTER — Ambulatory Visit
Admission: EM | Admit: 2020-04-13 | Discharge: 2020-04-13 | Disposition: A | Discharge status: Home / Self Care | Payer: BC Managed Care – PPO | Attending: Emergency Medicine | Admitting: Emergency Medicine

## 2020-04-13 ENCOUNTER — Other Ambulatory Visit: Payer: Self-pay

## 2020-04-13 DIAGNOSIS — H9203 Otalgia, bilateral: Secondary | ICD-10-CM

## 2020-04-13 MED ORDER — AZELASTINE HCL 0.1 % NA SOLN: 2.0000 | Freq: Two times a day (BID) | NASAL | 12 refills | Status: DC

## 2020-04-13 MED ORDER — TRAMADOL HCL 50 MG PO TABS: 50.0000 mg | ORAL_TABLET | Freq: Four times a day (QID) | ORAL | 0 refills | Status: DC | PRN

## 2020-04-13 MED ORDER — IBUPROFEN 800 MG PO TABS: 800.0000 mg | ORAL_TABLET | Freq: Three times a day (TID) | ORAL | 0 refills | Status: DC

## 2020-04-13 MED ORDER — CEFDINIR 300 MG PO CAPS: 300.0000 mg | ORAL_CAPSULE | Freq: Two times a day (BID) | ORAL | 0 refills | Status: DC

## 2020-04-13 NOTE — ED Triage Notes (Signed)
Pt sts bilateral ear pain and vertigo sx x 5 days; pt seen here Monday for same and given amoxicillin; pt sts not improved

## 2020-04-13 NOTE — Discharge Instructions (Signed)
Switch to omnicef Astelin twice daily Use anti-inflammatories for pain/swelling. You may take up to 800 mg Ibuprofen every 8 hours with food. You may supplement Ibuprofen with Tylenol [phone] mg every 8 hours.  Follow up with ENT
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
## 2020-04-24 ENCOUNTER — Telehealth (INDEPENDENT_AMBULATORY_CARE_PROVIDER_SITE_OTHER): Payer: BC Managed Care – PPO | Admitting: Osteopathic Medicine

## 2020-04-24 ENCOUNTER — Encounter: Payer: Self-pay | Admitting: Osteopathic Medicine

## 2020-04-24 VITALS — HR 91 | Temp 102.4°F | Resp 36 | Wt 230.0 lb

## 2020-04-24 DIAGNOSIS — R6889 Other general symptoms and signs: Secondary | ICD-10-CM

## 2020-04-24 MED ORDER — OSELTAMIVIR PHOSPHATE 75 MG PO CAPS: 75.0000 mg | ORAL_CAPSULE | Freq: Two times a day (BID) | ORAL | 0 refills | Status: DC

## 2020-04-24 MED ORDER — GUAIFENESIN-CODEINE 100-10 MG/5ML PO SYRP: 5.0000 mL | ORAL_SOLUTION | Freq: Four times a day (QID) | ORAL | 0 refills | Status: DC | PRN

## 2020-04-24 NOTE — Progress Notes (Signed)
Attempted to contact pt at 1213 pm, no answer. Unable to leave a vm msg. Vm box is full.

## 2020-04-24 NOTE — Progress Notes (Signed)
Telemedicine Visit via  Video & Audio (App used: [id number])   I connected with [name] on 04/24/20 at 1:09 PM  by phone or  telemedicine application as noted above  I verified that I am speaking with or regarding  the correct patient using two identifiers.  Participants: Myself, Dr Emeterio Reeve DO Patient: [name] Patient proxy if applicable: none Other, if applicable: none  Patient is at home I am in office at [facility]    I discussed the limitations of evaluation and management  by telemedicine and the availability of in person appointments.  The participant(s) above expressed understanding and  agreed to proceed with this appointment via telemedicine.       History of Present Illness: [name] is a 50 y.o. female who would like to discuss possible flu  Coughing  No appetite  Symptom onset 2 days ago COVID vaxxed and boosted (+)flu exposure  OTC medications taken so far: NyQuil, cough drops, Mucinex.        Observations/Objective: Pulse 91   Temp (!) 102.4 F (39.1 C)   Resp (!) 36   Wt 230 lb (104.3 kg)   LMP 11/02/2011 (Exact Date)   BMI 40.74 kg/m  BP Readings from Last 3 Encounters:  04/13/20 (!) 143/83  04/10/20 126/83  03/21/20 110/63   Exam: Normal Speech.  NAD  Lab and Radiology Results No results found for this or any previous visit (from the past 72 hour(s)). No results found.     Assessment and Plan: 50 y.o. female with The encounter diagnosis was Flu-like symptoms.  Presumably flu given exposure, certainly always a possibility of COVID these days. Pt aware of isolation precautions and availability of testing here if needed.   PDMP not reviewed this encounter. No orders of the defined types were placed in this encounter.  Meds ordered this encounter  Medications  . oseltamivir (TAMIFLU) 75 MG capsule    Sig: Take 1 capsule (75 mg total) by mouth 2 (two) times daily.     Dispense:  10 capsule    Refill:  0  . guaiFENesin-codeine (ROBITUSSIN AC) 100-10 MG/5ML syrup    Sig: Take 5-10 mLs by mouth 4 (four) times daily as needed for cough or congestion.    Dispense:  180 mL    Refill:  0   Patient Instructions  Medications & Home Remedies for Upper Respiratory Illness   Note: the following list assumes no pregnancy, normal liver & kidney function and no other drug interactions. Dr. Sheppard Coil has highlighted medications which are safe for you to use, but these may not be appropriate for everyone. Always ask a pharmacist or qualified medical provider if you have any questions!    Aches/Pains, Fever, Headache OTC Acetaminophen (Tylenol) 500 mg tablets - take max 2 tablets (1000 mg) every 6 hours (4 times per day)  OTC Ibuprofen (Motrin) 200 mg tablets - take max 4 tablets (800 mg) every 6 hours*   Sinus Congestion OTC Nasal Saline if desired to rinse OTC Oxymetolazone (Afrin, others) sparing use due to rebound congestion, NEVER use in kids  OTC Phenylephrine (Sudafed) 10 mg tablets every 4 hours (or the 12-hour formulation)* OTC Diphenhydramine (Benadryl) 25 mg tablets - take max 2 tablets every 4 hours   Cough & Sore Throat Prescription cough pills or syrups as directed OTC Dextromethorphan (Robitussin, others) - cough suppressant OTC Guaifenesin (Robitussin, Mucinex, others) - expectorant (helps cough up mucus) (Dextromethorphan and Guaifenesin also come in  a combination tablet/syrup) OTC Lozenges w/ Benzocaine + Menthol (Cepacol) Honey - as much as you want! Teas which "coat the throat" - look for ingredients Elm Bark, Licorice Root, Marshmallow Root   Other Prescription Oral Steroids to decrease inflammation and improve energy OTC Zinc Lozenges within 24 hours of symptoms onset - mixed evidence this shortens the duration of the common cold Don't waste your money on Vitamin C or Echinacea in acute illness - it's already too late!    *Caution in  patients with high blood pressure     Instructions sent via MyChart.   Follow Up Instructions: Return if symptoms worsen or fail to improve.    I discussed the assessment and treatment plan with the patient. The patient was provided an opportunity to ask questions and all were answered. The patient agreed with the plan and demonstrated an understanding of the instructions.   The patient was advised to call back or seek an in-person evaluation if any new concerns, if symptoms worsen or if the condition fails to improve as anticipated.  20 minutes of non-face-to-face time was provided during this encounter.      . . . . . . . . . . . . . Marland Kitchen                   Historical information moved to improve visibility of documentation.  Past Medical History:  Diagnosis Date  . Adenomyosis 2013  . Anemia   . Anemia, iron deficiency 12/23/2011  . Anxiety   . Asthma   . Cancer of appendix ([place])   . Complication of anesthesia    scoline pain? from use of Succinylcholine   . Concussion syndrome 07/11/2016  . Cough   . Depression   . Endometriosis   . Generalized headaches   . GERD (gastroesophageal reflux disease)   . Heart murmur   . History of sinus surgery 2010   MAXILLARY, ETHMOID, SPHENOID  . Hypothyroidism   . Iron deficiency anemia   . Kidney stones   . Migraine   . Obesity, Class III, BMI 40-49.9 (morbid obesity) ([place])   . SVD (spontaneous vaginal delivery)    x3  . Vitamin D deficiency 02/02/2017   Past Surgical History:  Procedure Laterality Date  . ABDOMINAL HYSTERECTOMY    . CHOLECYSTECTOMY  01/27/2012   Procedure: LAPAROSCOPIC CHOLECYSTECTOMY;  Surgeon: Harl Bowie, MD;  Location: [facility];  Service: General;  Laterality: N/A;  Laparoscopic chole  . COLPOSCOPY  2007  . KNEE SURGERY     right, scope and ACL repair  . LAPAROSCOPIC APPENDECTOMY  01/02/2012   Procedure: APPENDECTOMY LAPAROSCOPIC;  Surgeon: Harl Bowie, MD;   Location: [facility];  Service: General;  Laterality: N/A;  . LAPAROSCOPIC ASSISTED VAGINAL HYSTERECTOMY  11/13/2011   Procedure: LAPAROSCOPIC ASSISTED VAGINAL HYSTERECTOMY;  Surgeon: Darlyn Chamber, MD;  Location: [facility];  Service: Gynecology;  Laterality: N/A;  . LAPAROSCOPY  05/30/2011   Procedure: LAPAROSCOPY OPERATIVE;  Surgeon: Darlyn Chamber, MD;  Location: [facility];  Service: Gynecology;  Laterality: N/A;  YAG  LASER of Endometriosis.  Marland Kitchen NASAL SINUS SURGERY  2010  . PARTIAL COLECTOMY  01/27/2012   Social History   Tobacco Use  . Smoking status: Former Smoker    Packs/day: 0.75    Years: 1.00    Pack years: 0.75    Types: Cigarettes    Quit date: 2017    Years since quitting: 5.0  . Smokeless tobacco: Never  Used  Substance Use Topics  . Alcohol use: No   family history includes Alcohol abuse in her brother and father; Asthma in her mother and sister; Colon cancer in her paternal grandmother; Depression in her brother, mother, sister, and sister; Drug abuse in her brother; Lung cancer in her father; Other in her mother; Suicidality in her brother.  Medications: Current Outpatient Medications  Medication Sig Dispense Refill  . albuterol (VENTOLIN HFA) 108 (90 Base) MCG/ACT inhaler Inhale 1-2 puffs into the lungs every 4 (four) hours as needed for wheezing or shortness of breath. 18 g 3  . ALPRAZolam (XANAX) 0.5 MG tablet Take 0.5-1 tablets (0.25-0.5 mg total) by mouth daily as needed for anxiety (panic). 30 tablet 2  . azelastine (ASTELIN) 0.1 % nasal spray Place 2 sprays into both nostrils 2 (two) times daily. Use in each nostril as directed 30 mL 12  . buPROPion (WELLBUTRIN XL) 150 MG 24 hr tablet Take 150 mg by mouth daily.    . butalbital-acetaminophen-caffeine (FIORICET) 50-325-40 MG tablet Take 1-2 tablets by mouth every 6 (six) hours as needed for headache or migraine (max 3 tablets in 24 hours). 15 tablet 0  . cefdinir (OMNICEF) 300 MG capsule Take 1 capsule (300 mg total) by mouth  2 (two) times daily. 14 capsule 0  . esomeprazole (NEXIUM) 20 MG capsule Take 20 mg by mouth daily at 12 noon.    Marland Kitchen FLUoxetine (PROZAC) 40 MG capsule Take 40 mg by mouth daily.    Marland Kitchen guaiFENesin-codeine (ROBITUSSIN AC) 100-10 MG/5ML syrup Take 5-10 mLs by mouth 4 (four) times daily as needed for cough or congestion. 180 mL 0  . ibuprofen (ADVIL) 800 MG tablet Take 1 tablet (800 mg total) by mouth 3 (three) times daily. 21 tablet 0  . levothyroxine (EUTHYROX) 112 MCG tablet Take 1 tablet (112 mcg total) by mouth daily before breakfast. 90 tablet 3  . meclizine (ANTIVERT) 25 MG tablet Take 1 tablet (25 mg total) by mouth 3 (three) times daily as needed for dizziness. 30 tablet 0  . methocarbamol (ROBAXIN) 750 MG tablet Take 1 tablet (750 mg total) by mouth every 8 (eight) hours as needed for muscle spasms. 90 tablet 3  . oseltamivir (TAMIFLU) 75 MG capsule Take 1 capsule (75 mg total) by mouth 2 (two) times daily. 10 capsule 0  . predniSONE (DELTASONE) 50 MG tablet Take 1 tab daily with breakfast for 3 days 3 tablet 0  . Probiotic Product (PROBIOTIC PO) Take 1 tablet by mouth daily.     Marland Kitchen topiramate (TOPAMAX) 100 MG tablet Take 1 tablet (100 mg total) by mouth 2 (two) times daily. 180 tablet 3  . traMADol (ULTRAM) 50 MG tablet Take 1 tablet (50 mg total) by mouth every 6 (six) hours as needed for severe pain. 6 tablet 0   No current facility-administered medications for this visit.   Allergies  Allergen Reactions  . Reglan [Metoclopramide] Other (See Comments)    Caused a dystonic reaction.  . Cetacaine [Butamben-Tetracaine-Benzocaine] Nausea And Vomiting  . 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  . Ciprofloxacin Nausea And Vomiting  . Clarithromycin Hives  . Moxifloxacin Hives     If phone visit, billing and coding can please add appropriate modifier if needed

## 2020-04-24 NOTE — Patient Instructions (Addendum)
Medications & Home Remedies for Upper Respiratory Illness   Note: the following list assumes no pregnancy, normal liver & kidney function and no other drug interactions. Dr. Sheppard Coil has highlighted medications which are safe for you to use, but these may not be appropriate for everyone. Always ask a pharmacist or qualified medical provider if you have any questions!    Aches/Pains, Fever, Headache OTC Acetaminophen (Tylenol) 500 mg tablets - take max 2 tablets (1000 mg) every 6 hours (4 times per day)  OTC Ibuprofen (Motrin) 200 mg tablets - take max 4 tablets (800 mg) every 6 hours*   Sinus Congestion OTC Nasal Saline if desired to rinse OTC Oxymetolazone (Afrin, others) sparing use due to rebound congestion, NEVER use in kids  OTC Phenylephrine (Sudafed) 10 mg tablets every 4 hours (or the 12-hour formulation)* OTC Diphenhydramine (Benadryl) 25 mg tablets - take max 2 tablets every 4 hours   Cough & Sore Throat Prescription cough pills or syrups as directed OTC Dextromethorphan (Robitussin, others) - cough suppressant OTC Guaifenesin (Robitussin, Mucinex, others) - expectorant (helps cough up mucus) (Dextromethorphan and Guaifenesin also come in a combination tablet/syrup) OTC Lozenges w/ Benzocaine + Menthol (Cepacol) Honey - as much as you want! Teas which "coat the throat" - look for ingredients Elm Bark, Licorice Root, Marshmallow Root   Other Prescription Oral Steroids to decrease inflammation and improve energy OTC Zinc Lozenges within 24 hours of symptoms onset - mixed evidence this shortens the duration of the common cold Don't waste your money on Vitamin C or Echinacea in acute illness - it's already too late!    *Caution in patients with high blood pressure

## 2020-05-10 ENCOUNTER — Ambulatory Visit (INDEPENDENT_AMBULATORY_CARE_PROVIDER_SITE_OTHER): Payer: BC Managed Care – PPO | Admitting: Medical-Surgical

## 2020-05-10 ENCOUNTER — Encounter: Payer: Self-pay | Admitting: Medical-Surgical

## 2020-05-10 ENCOUNTER — Other Ambulatory Visit: Payer: Self-pay

## 2020-05-10 VITALS — BP 118/88 | HR 82 | Temp 97.7°F | Ht 63.0 in | Wt 231.2 lb

## 2020-05-10 DIAGNOSIS — J329 Chronic sinusitis, unspecified: Secondary | ICD-10-CM | POA: Diagnosis not present

## 2020-05-10 DIAGNOSIS — J40 Bronchitis, not specified as acute or chronic: Secondary | ICD-10-CM

## 2020-05-10 MED ORDER — METHYLPREDNISOLONE ACETATE 80 MG/ML IJ SUSP
80.0000 mg | Freq: Once | INTRAMUSCULAR | Status: AC
  Administered 2020-05-10: 80 mg via INTRAMUSCULAR

## 2020-05-10 MED ORDER — GUAIFENESIN-CODEINE 100-10 MG/5ML PO SYRP: 5.0000 mL | ORAL_SOLUTION | Freq: Every evening | ORAL | 0 refills | Status: DC | PRN

## 2020-05-10 MED ORDER — AZITHROMYCIN 250 MG PO TABS: ORAL_TABLET | ORAL | 0 refills | Status: DC

## 2020-05-10 NOTE — Progress Notes (Signed)
Subjective:    CC: continued ear pain and viral symptoms  HPI: Pleasant 50 year old female presenting for continued ear pain and viral symptoms. She was seen just after Christmas and diagnosed with bilateral otitis media. Three days later, she returned for continued ear pain. On 04/24/2020, she was seen virtually by her PCP and treated for influenza type symptoms after flu exposure with Tamiflu. Notes that her symptoms began to improve on Tamiflu but have never resolved. Today she reports she continues to have right ear pain with itching of her left ear. She is having cough, congestion, rhinorrhea, fever t-max 100.6, chills, and chest tightness. Notes that her left upper back/shoulder blade feels heavy. Has been taking Astelin, Nyquil, Mucinex, Delsym, and using a netti pot. Feels that her ear pain started after doing float therapy without using ear plugs. History of small eustachian tubes per her ENT. Was never tested for influenza or COVID.   I reviewed the past medical history, family history, social history, surgical history, and allergies today and no changes were needed.  Please see the problem list section below in epic for further details.  Past Medical History: Past Medical History:  Diagnosis Date  . Adenomyosis 2013  . Anemia   . Anemia, iron deficiency 12/23/2011  . Anxiety   . Asthma   . Cancer of appendix ([place])   . Complication of anesthesia    scoline pain? from use of Succinylcholine   . Concussion syndrome 07/11/2016  . Cough   . Depression   . Endometriosis   . Generalized headaches   . GERD (gastroesophageal reflux disease)   . Heart murmur   . History of sinus surgery 2010   MAXILLARY, ETHMOID, SPHENOID  . Hypothyroidism   . Iron deficiency anemia   . Kidney stones   . Migraine   . Obesity, Class III, BMI 40-49.9 (morbid obesity) ([place])   . SVD (spontaneous vaginal delivery)    x3  . Vitamin D deficiency 02/02/2017   Past Surgical History: Past Surgical  History:  Procedure Laterality Date  . ABDOMINAL HYSTERECTOMY    . CHOLECYSTECTOMY  01/27/2012   Procedure: LAPAROSCOPIC CHOLECYSTECTOMY;  Surgeon: Harl Bowie, MD;  Location: [facility];  Service: General;  Laterality: N/A;  Laparoscopic chole  . COLPOSCOPY  2007  . KNEE SURGERY     right, scope and ACL repair  . LAPAROSCOPIC APPENDECTOMY  01/02/2012   Procedure: APPENDECTOMY LAPAROSCOPIC;  Surgeon: Harl Bowie, MD;  Location: [facility];  Service: General;  Laterality: N/A;  . LAPAROSCOPIC ASSISTED VAGINAL HYSTERECTOMY  11/13/2011   Procedure: LAPAROSCOPIC ASSISTED VAGINAL HYSTERECTOMY;  Surgeon: Darlyn Chamber, MD;  Location: [facility];  Service: Gynecology;  Laterality: N/A;  . LAPAROSCOPY  05/30/2011   Procedure: LAPAROSCOPY OPERATIVE;  Surgeon: Darlyn Chamber, MD;  Location: [facility];  Service: Gynecology;  Laterality: N/A;  YAG  LASER of Endometriosis.  Marland Kitchen NASAL SINUS SURGERY  2010  . PARTIAL COLECTOMY  01/27/2012   Social History: Social History   Socioeconomic History  . Marital status: Married    Spouse name: Not on file  . Number of children: 3  . Years of education: Not on file  . Highest education level: Not on file  Occupational History  . Occupation: Programmer, multimedia: [employer]  Tobacco Use  . Smoking status: Former Smoker    Packs/day: 0.75    Years: 1.00    Pack years: 0.75    Types: Cigarettes    Quit date:  2017    Years since quitting: 5.0  . Smokeless tobacco: Never Used  Vaping Use  . Vaping Use: Never used  Substance and Sexual Activity  . Alcohol use: No  . Drug use: No  . Sexual activity: Yes    Partners: Male    Birth control/protection: Surgical  Other Topics Concern  . Not on file  Social History Narrative  . Not on file   Social Determinants of Health   Financial Resource Strain: Not on file  Food Insecurity: Not on file  Transportation Needs: Not on file  Physical Activity: Not on file  Stress: Not on file  Social Connections: Not on  file   Family History: Family History  Problem Relation Age of Onset  . Lung cancer Father   . Alcohol abuse Father   . Depression Mother   . Asthma Mother   . Other Mother   . Depression Sister   . Suicidality Brother   . Depression Brother   . Alcohol abuse Brother   . Drug abuse Brother   . Depression Sister   . Colon cancer Paternal Grandmother   . Asthma Sister   . Anesthesia problems Neg Hx    Allergies: Allergies  Allergen Reactions  . 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  . Reglan [Metoclopramide] Other (See Comments)    Caused a dystonic reaction.  . Cetacaine [Butamben-Tetracaine-Benzocaine] Nausea And Vomiting  . Ciprofloxacin Nausea And Vomiting  . Clarithromycin Hives  . Moxifloxacin Hives   Medications: See med rec.  Review of Systems: See HPI for pertinent positives and negatives.   Objective:    General: Well Developed, well nourished, and in no acute distress.  Neuro: Alert and oriented x3.  HEENT: Normocephalic, atraumatic. Bilateral TMs intact without redness or bulging. Ear canals mildly erythematous. No sinus tenderness or lymphadenopathy. Oral and nasal exams deferred due to masking protocol.  Skin: Warm and dry. Cardiac: Regular rate and rhythm, no murmurs rubs or gallops, no lower extremity edema.  Respiratory: Clear to auscultation bilaterally. Not using accessory muscles, speaking in full sentences.  Impression and Recommendations:    1. Sinobronchitis Due to prolonged symptoms, will go ahead and treat for sinobronchitis with Azithromycin. Sending in robitussin-AC for use at bedtime. Depo-Medrol IM x 1 in office since she does not tolerate oral steroids well. 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 Advised that her most timely option will be home testing but reviewed other options available. No swabs available in clinic at this time so  unable to test today.  - methylPREDNISolone acetate (DEPO-MEDROL) injection 80 mg  Return if symptoms worsen or fail to improve. ___________________________________________ Clearnce Sorrel, DNP, APRN, FNP-BC [facility]

## 2020-05-23 ENCOUNTER — Other Ambulatory Visit: Payer: Self-pay

## 2020-05-23 ENCOUNTER — Ambulatory Visit (INDEPENDENT_AMBULATORY_CARE_PROVIDER_SITE_OTHER): Payer: BC Managed Care – PPO

## 2020-05-23 ENCOUNTER — Encounter: Payer: Self-pay | Admitting: Osteopathic Medicine

## 2020-05-23 ENCOUNTER — Ambulatory Visit (INDEPENDENT_AMBULATORY_CARE_PROVIDER_SITE_OTHER): Payer: BC Managed Care – PPO | Admitting: Osteopathic Medicine

## 2020-05-23 VITALS — BP 143/94 | HR 85 | Temp 97.9°F | Wt 229.0 lb

## 2020-05-23 DIAGNOSIS — M25511 Pain in right shoulder: Secondary | ICD-10-CM

## 2020-05-23 DIAGNOSIS — W19XXXA Unspecified fall, initial encounter: Secondary | ICD-10-CM

## 2020-05-23 DIAGNOSIS — M25551 Pain in right hip: Secondary | ICD-10-CM

## 2020-05-23 MED ORDER — CYCLOBENZAPRINE HCL 10 MG PO TABS: 5.0000 mg | ORAL_TABLET | Freq: Three times a day (TID) | ORAL | 1 refills | Status: DC | PRN

## 2020-05-23 MED ORDER — OXYCODONE-ACETAMINOPHEN 5-325 MG PO TABS: 1.0000 | ORAL_TABLET | Freq: Four times a day (QID) | ORAL | 0 refills | Status: AC | PRN

## 2020-05-23 MED ORDER — NAPROXEN 375 MG PO TABS: 375.0000 mg | ORAL_TABLET | Freq: Three times a day (TID) | ORAL | 0 refills | Status: DC

## 2020-05-23 MED ORDER — PREDNISONE 20 MG PO TABS: 20.0000 mg | ORAL_TABLET | Freq: Two times a day (BID) | ORAL | 0 refills | Status: DC

## 2020-05-23 NOTE — Patient Instructions (Addendum)
Plan:  Xrays today Sling for shoulder to use most of the day to immobilize  Will try to get MRI, insurance may not cover this unless 6+ weeks since injury / fail physical therapy  Referral for physical therapy  Work note - let me know if needed!   Prescriptions:  Steroids (predisone) burst for 5 days  Anti-inflammatory (naproxen) take daily for 5-7 days then can keep on had for use as-needed after that  Muscle relaxer (cyclobenzaprine) as needed for muscle spasm - may help to take in the evening / bedtime   Severe pain (oxycodone) medication to use as needed   Please contact our office for sports medicine appointment with Dr T if PT not helping
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
## 2020-05-31 ENCOUNTER — Ambulatory Visit: Payer: BC Managed Care – PPO | Admitting: Sports Medicine

## 2020-06-02 ENCOUNTER — Encounter: Payer: Self-pay | Admitting: Osteopathic Medicine

## 2020-06-06 ENCOUNTER — Telehealth: Payer: Self-pay | Admitting: Osteopathic Medicine

## 2020-06-06 NOTE — Telephone Encounter (Signed)
Pt called and states that Dr Sheppard Coil gave her a work note to stay out of work due to her shoulder, but now that patient has rested her shoulder all weekend, it is feeling better and she has full range of motion . Her work is requiring her a note to return to work

## 2020-06-07 ENCOUNTER — Encounter: Payer: Self-pay | Admitting: Osteopathic Medicine

## 2020-06-07 NOTE — Telephone Encounter (Signed)
My Chart message sent

## 2020-06-08 ENCOUNTER — Ambulatory Visit: Payer: BC Managed Care – PPO | Admitting: Rehabilitative and Restorative Service Providers"

## 2020-06-20 ENCOUNTER — Ambulatory Visit (INDEPENDENT_AMBULATORY_CARE_PROVIDER_SITE_OTHER): Payer: BC Managed Care – PPO | Admitting: Osteopathic Medicine

## 2020-06-20 ENCOUNTER — Encounter: Payer: Self-pay | Admitting: Osteopathic Medicine

## 2020-06-20 ENCOUNTER — Other Ambulatory Visit: Payer: Self-pay

## 2020-06-20 VITALS — BP 144/101 | HR 99 | Temp 97.7°F | Wt 232.1 lb

## 2020-06-20 DIAGNOSIS — M25511 Pain in right shoulder: Secondary | ICD-10-CM | POA: Diagnosis not present

## 2020-06-20 DIAGNOSIS — S46001D Unspecified injury of muscle(s) and tendon(s) of the rotator cuff of right shoulder, subsequent encounter: Secondary | ICD-10-CM

## 2020-06-20 MED ORDER — IBUPROFEN 800 MG PO TABS: 800.0000 mg | ORAL_TABLET | Freq: Three times a day (TID) | ORAL | 2 refills | Status: DC | PRN

## 2020-06-20 MED ORDER — HYDROCODONE-ACETAMINOPHEN 5-325 MG PO TABS: 1.0000 | ORAL_TABLET | ORAL | 0 refills | Status: DC | PRN
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
## 2020-06-21 ENCOUNTER — Ambulatory Visit (INDEPENDENT_AMBULATORY_CARE_PROVIDER_SITE_OTHER): Payer: BC Managed Care – PPO | Admitting: Physical Therapy

## 2020-06-21 DIAGNOSIS — R29898 Other symptoms and signs involving the musculoskeletal system: Secondary | ICD-10-CM

## 2020-06-21 DIAGNOSIS — M6281 Muscle weakness (generalized): Secondary | ICD-10-CM | POA: Diagnosis not present

## 2020-06-21 DIAGNOSIS — M25511 Pain in right shoulder: Secondary | ICD-10-CM | POA: Diagnosis not present
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
## 2020-06-21 NOTE — Patient Instructions (Signed)
Access Code: [email or web address] URL: [email or web address] Date: 06/21/2020 Prepared by: Isabelle Course  Exercises Supine Shoulder Flexion Extension AAROM with Dowel - 1 x daily - 7 x weekly - 3 sets - 10 reps Supine Shoulder Abduction AAROM with Dowel - 1 x daily - 7 x weekly - 3 sets - 10 reps Seated Scapular Retraction - 1 x daily - 7 x weekly - 3 sets - 10 reps Standing Backward Shoulder Rolls - 1 x daily - 7 x weekly - 3 sets - 10 reps

## 2020-06-27 ENCOUNTER — Encounter: Payer: Self-pay | Admitting: Physical Therapy

## 2020-07-14 ENCOUNTER — Other Ambulatory Visit: Payer: Self-pay

## 2020-07-14 ENCOUNTER — Ambulatory Visit: Admit: 2020-07-14 | Disposition: A | Discharge status: Home / Self Care | Payer: BC Managed Care – PPO

## 2020-07-14 ENCOUNTER — Ambulatory Visit
Admission: EM | Admit: 2020-07-14 | Discharge: 2020-07-14 | Disposition: A | Discharge status: Home / Self Care | Payer: BC Managed Care – PPO | Attending: Urgent Care | Admitting: Urgent Care

## 2020-07-14 DIAGNOSIS — J3089 Other allergic rhinitis: Secondary | ICD-10-CM

## 2020-07-14 DIAGNOSIS — H6983 Other specified disorders of Eustachian tube, bilateral: Secondary | ICD-10-CM

## 2020-07-14 DIAGNOSIS — R059 Cough, unspecified: Secondary | ICD-10-CM

## 2020-07-14 DIAGNOSIS — H9201 Otalgia, right ear: Secondary | ICD-10-CM

## 2020-07-14 DIAGNOSIS — J4530 Mild persistent asthma, uncomplicated: Secondary | ICD-10-CM

## 2020-07-14 MED ORDER — PREDNISONE 20 MG PO TABS: ORAL_TABLET | ORAL | 0 refills | Status: DC

## 2020-07-14 MED ORDER — HYDROXYZINE HCL 25 MG PO TABS: 25.0000 mg | ORAL_TABLET | Freq: Every evening | ORAL | 0 refills | Status: DC | PRN

## 2020-07-14 NOTE — ED Triage Notes (Signed)
Pt presents with ear pain and sore throat, right ear is worse

## 2020-07-14 NOTE — ED Provider Notes (Signed)
[facility]   MRN: [id number] DOB: 1970/09/24  Subjective:   [name] is a 50 y.o. female presenting for 2-day history of recurrent bilateral ear pain, right worse than left, throat pain.  Patient has a history of eustachian tube dysfunction, allergic rhinitis.  Denies chest pain, shortness of breath.  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   No current facility-administered medications for this encounter.  Current Outpatient Medications:  .  albuterol (VENTOLIN HFA) 108 (90 Base) MCG/ACT inhaler, Inhale 1-2 puffs into the lungs every 4 (four) hours as needed for wheezing or shortness of breath., Disp: 18 g, Rfl: 3 .  ALPRAZolam (XANAX) 0.5 MG tablet, Take 0.5-1 tablets (0.25-0.5 mg total) by mouth daily as needed for anxiety (panic)., Disp: 30 tablet, Rfl: 2 .  azelastine (ASTELIN) 0.1 % nasal spray, Place 2 sprays into both nostrils 2 (two) times daily. Use in each nostril as directed (Patient not taking: Reported on 06/20/2020), Disp: 30 mL, Rfl: 12 .  butalbital-acetaminophen-caffeine (FIORICET) 50-325-40 MG tablet, Take 1-2 tablets by mouth every 6 (six) hours as needed for headache or migraine (max 3 tablets in 24 hours)., Disp: 15 tablet, Rfl: 0 .  cyclobenzaprine (FLEXERIL) 10 MG tablet, Take 0.5-1 tablets (5-10 mg total) by mouth 3 (three) times daily as needed for muscle spasms. Caution: can cause drowsiness, Disp: 60 tablet, Rfl: 1 .  esomeprazole (NEXIUM) 20 MG capsule, Take 20 mg by mouth daily at 12 noon., Disp: , Rfl:  .  FLUoxetine (PROZAC) 40 MG capsule, Take 40 mg by mouth daily., Disp: , Rfl:  .  HYDROcodone-acetaminophen (NORCO/VICODIN) 5-325 MG tablet, Take 1-2 tablets by mouth every 4 (four) hours as needed for moderate pain or severe pain., Disp: 20 tablet, Rfl: 0 .  ibuprofen (ADVIL) 800 MG tablet, Take 1 tablet (800 mg total) by mouth every 8 (eight) hours as needed., Disp: 60 tablet, Rfl: 2 .  levothyroxine (EUTHYROX) 112 MCG  tablet, Take 1 tablet (112 mcg total) by mouth daily before breakfast., Disp: 90 tablet, Rfl: 3 .  meclizine (ANTIVERT) 25 MG tablet, Take 1 tablet (25 mg total) by mouth 3 (three) times daily as needed for dizziness., Disp: 30 tablet, Rfl: 0 .  predniSONE (DELTASONE) 20 MG tablet, Take 1 tablet (20 mg total) by mouth 2 (two) times daily with a meal., Disp: 10 tablet, Rfl: 0 .  Probiotic Product (PROBIOTIC PO), Take 1 tablet by mouth daily. , Disp: , Rfl:  .  topiramate (TOPAMAX) 100 MG tablet, Take 1 tablet (100 mg total) by mouth 2 (two) times daily., Disp: 180 tablet, Rfl: 3   Allergies  Allergen Reactions  . 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  . Reglan [Metoclopramide] Other (See Comments)    Caused a dystonic reaction.  . Cetacaine [Butamben-Tetracaine-Benzocaine] Nausea And Vomiting  . Ciprofloxacin Nausea And Vomiting  . Clarithromycin Hives  . Moxifloxacin Hives    Past Medical History:  Diagnosis Date  . Adenomyosis 2013  . Anemia   . Anemia, iron deficiency 12/23/2011  . Anxiety   . Asthma   . Cancer of appendix ([place])   . Complication of anesthesia    scoline pain? from use of Succinylcholine   . Concussion syndrome 07/11/2016  . Cough   . Depression   . Endometriosis   . Generalized headaches   . GERD (gastroesophageal reflux disease)   . Heart murmur   . History of  sinus surgery 2010   MAXILLARY, ETHMOID, SPHENOID  . Hypothyroidism   . Iron deficiency anemia   . Kidney stones   . Migraine   . Obesity, Class III, BMI 40-49.9 (morbid obesity) ([place])   . SVD (spontaneous vaginal delivery)    x3  . Vitamin D deficiency 02/02/2017     Past Surgical History:  Procedure Laterality Date  . ABDOMINAL HYSTERECTOMY    . CHOLECYSTECTOMY  01/27/2012   Procedure: LAPAROSCOPIC CHOLECYSTECTOMY;  Surgeon: Harl Bowie, MD;  Location: [facility];  Service: General;  Laterality: N/A;  Laparoscopic chole  . COLPOSCOPY  2007  . KNEE SURGERY     right, scope  and ACL repair  . LAPAROSCOPIC APPENDECTOMY  01/02/2012   Procedure: APPENDECTOMY LAPAROSCOPIC;  Surgeon: Harl Bowie, MD;  [facility];  Service: General;  Laterality: N/A;  . LAPAROSCOPIC ASSISTED VAGINAL HYSTERECTOMY  11/13/2011   Procedure: LAPAROSCOPIC ASSISTED VAGINAL HYSTERECTOMY;  Surgeon: Darlyn Chamber, MD;  Location: [facility];  Service: Gynecology;  Laterality: N/A;  . LAPAROSCOPY  05/30/2011   Procedure: LAPAROSCOPY OPERATIVE;  Surgeon: Darlyn Chamber, MD;  Location: [facility];  Service: Gynecology;  Laterality: N/A;  YAG  LASER of Endometriosis.  Marland Kitchen NASAL SINUS SURGERY  2010  . PARTIAL COLECTOMY  01/27/2012    Family History  Problem Relation Age of Onset  . Lung cancer Father   . Alcohol abuse Father   . Depression Mother   . Asthma Mother   . Other Mother   . Depression Sister   . Suicidality Brother   . Depression Brother   . Alcohol abuse Brother   . Drug abuse Brother   . Depression Sister   . Colon cancer Paternal Grandmother   . Asthma Sister   . Anesthesia problems Neg Hx     Social History   Tobacco Use  . Smoking status: Former Smoker    Packs/day: 0.75    Years: 1.00    Pack years: 0.75    Types: Cigarettes    Quit date: 2017    Years since quitting: 5.2  . Smokeless tobacco: Never Used  Vaping Use  . Vaping Use: Never used  Substance Use Topics  . Alcohol use: No  . Drug use: No    ROS   Objective:   Vitals: BP 124/88   Pulse 86   Temp 98 F (36.7 C)   Resp (!) 86   LMP 11/02/2011 (Exact Date)   SpO2 98%   Physical Exam Constitutional:      General: She is not in acute distress.    Appearance: Normal appearance. She is well-developed. She is not ill-appearing, toxic-appearing or diaphoretic.  HENT:     Head: Normocephalic and atraumatic.     Right Ear: Tympanic membrane, ear canal and external ear normal. No drainage or tenderness. No middle ear effusion. There is no impacted cerumen. Tympanic membrane is not erythematous.      Left Ear: Tympanic membrane, ear canal and external ear normal. No drainage or tenderness.  No middle ear effusion. There is no impacted cerumen. Tympanic membrane is not erythematous.     Nose: Congestion present. No rhinorrhea.     Comments: Nasal turbinates boggy and edematous.    Mouth/Throat:     Mouth: Mucous membranes are moist. No oral lesions.     Pharynx: No pharyngeal swelling, oropharyngeal exudate, posterior oropharyngeal erythema or uvula swelling.     Tonsils: No tonsillar exudate or tonsillar abscesses.  Eyes:  General: No scleral icterus.       Right eye: No discharge.        Left eye: No discharge.     Extraocular Movements: Extraocular movements intact.     Right eye: Normal extraocular motion.     Left eye: Normal extraocular motion.     Conjunctiva/sclera: Conjunctivae normal.     Pupils: Pupils are equal, round, and reactive to light.  Cardiovascular:     Rate and Rhythm: Normal rate.  Pulmonary:     Effort: Pulmonary effort is normal.  Musculoskeletal:     Cervical back: Normal range of motion and neck supple.  Lymphadenopathy:     Cervical: No cervical adenopathy.  Skin:    General: Skin is warm and dry.  Neurological:     General: No focal deficit present.     Mental Status: She is alert and oriented to person, place, and time.  Psychiatric:        Mood and Affect: Mood normal.        Behavior: Behavior normal.        Thought Content: Thought content normal.        Judgment: Judgment normal.      Assessment and Plan :   I have reviewed the PDMP during this encounter.  1. Eustachian tube dysfunction, bilateral   2. Right ear pain   3. Cough   4. Allergic rhinitis due to other allergic trigger, unspecified seasonality   5. Mild persistent asthma without complication     Will manage further eustachian tube dysfunction with prednisone course as she is already on a nasal steroid and antihistamine.  Counseled against using antibiotics at  this stage as she has had the symptoms for 2 days.  I also refused to fill the prescription for narcotic pain medication for her ear pain as patient has consistently had multiple controlled substances this year already including March. Counseled patient on potential for adverse effects with medications prescribed/recommended today, ER and return-to-clinic precautions discussed, patient verbalized understanding.    Jaynee Eagles, [place] 07/14/20 [phone]

## 2020-08-24 ENCOUNTER — Ambulatory Visit: Payer: BC Managed Care – PPO | Admitting: Family Medicine

## 2020-08-24 ENCOUNTER — Encounter: Payer: Self-pay | Admitting: Family Medicine

## 2020-08-24 ENCOUNTER — Other Ambulatory Visit: Payer: Self-pay

## 2020-08-24 ENCOUNTER — Other Ambulatory Visit: Payer: Self-pay | Admitting: Osteopathic Medicine

## 2020-08-24 VITALS — BP 137/87 | HR 79 | Resp 18 | Wt 235.1 lb

## 2020-08-24 DIAGNOSIS — E063 Autoimmune thyroiditis: Secondary | ICD-10-CM

## 2020-08-24 DIAGNOSIS — E669 Obesity, unspecified: Secondary | ICD-10-CM

## 2020-08-24 DIAGNOSIS — J4540 Moderate persistent asthma, uncomplicated: Secondary | ICD-10-CM

## 2020-08-24 DIAGNOSIS — R5383 Other fatigue: Secondary | ICD-10-CM | POA: Diagnosis not present

## 2020-08-24 DIAGNOSIS — E038 Other specified hypothyroidism: Secondary | ICD-10-CM

## 2020-08-24 MED ORDER — ALBUTEROL SULFATE HFA 108 (90 BASE) MCG/ACT IN AERS: 1.0000 | INHALATION_SPRAY | RESPIRATORY_TRACT | 3 refills | Status: DC | PRN

## 2020-08-24 MED ORDER — LEVOTHYROXINE SODIUM 112 MCG PO TABS: 112.0000 ug | ORAL_TABLET | Freq: Every day | ORAL | 3 refills | Status: DC

## 2020-08-24 MED ORDER — FLOVENT HFA 44 MCG/ACT IN AERO: 2.0000 | INHALATION_SPRAY | Freq: Two times a day (BID) | RESPIRATORY_TRACT | 12 refills | Status: DC

## 2020-08-24 NOTE — Progress Notes (Signed)
Acute Office Visit  Subjective:    Patient ID: [name], female    DOB: 04/25/1970, 50 y.o.   MRN: [id number]  Chief Complaint  Patient presents with  . Asthma  . Fatigue    HPI Patient is in today for worsening asthma and fatigue/thryoid issues.  Asthma: Patient reports that she has had a rough allergy season and it has been causing her asthma to worsen. Previously, she primarily had exercise-induced asthma that was well-controlled, however this spring she has been having frequent asthma attacks. Reports that she is trying to exercise at least 5 days per week and exercise still tends to be her biggest trigger, but with the pollen lately, she is wheezing a lot more and noticing an increase in wheezing and dry cough at night as well. She has been using her albuterol inhaler at least 3x/day for the past few weeks. She denies any chest pain, dyspnea at rest, fevers, headaches, GI symptoms.    Fatigue/Thyroid concerns: Patient states she has been working really hard to lose weight: exercising 5 days per week (cardio, yoga, resistance/weight training), limiting calories to <1200/day, and increasing hydration and vitamin intake. She states that she has noticed some change in how her clothes fit, but her weight is not changing despite her dedicated efforts. Additionally, she feels like she is much more fatigued than she should be and has also been having dry skin and constipation. Works night shift every other week, but trie to follow good sleep hygiene. History of hypothyroidism on synthroid, but it has been quite awhile since she had her levels checked. Orders were placed in December by PCP, but she was never able to make it to get them drawn. States she tried to be consistent taking her synthroid; usually gets it 80% of the days - works night shift and struggles to stay on schedule.     Past Medical History:  Diagnosis Date  . Adenomyosis 2013  . Anemia   . Anemia, iron  deficiency 12/23/2011  . Anxiety   . Asthma   . Cancer of appendix ([place])   . Complication of anesthesia    scoline pain? from use of Succinylcholine   . Concussion syndrome 07/11/2016  . Cough   . Depression   . Endometriosis   . Generalized headaches   . GERD (gastroesophageal reflux disease)   . Heart murmur   . History of sinus surgery 2010   MAXILLARY, ETHMOID, SPHENOID  . Hypothyroidism   . Iron deficiency anemia   . Kidney stones   . Migraine   . Obesity, Class III, BMI 40-49.9 (morbid obesity) ([place])   . SVD (spontaneous vaginal delivery)    x3  . Vitamin D deficiency 02/02/2017    Past Surgical History:  Procedure Laterality Date  . ABDOMINAL HYSTERECTOMY    . CHOLECYSTECTOMY  01/27/2012   Procedure: LAPAROSCOPIC CHOLECYSTECTOMY;  Surgeon: Harl Bowie, MD;  Location: [facility];  Service: General;  Laterality: N/A;  Laparoscopic chole  . COLPOSCOPY  2007  . KNEE SURGERY     right, scope and ACL repair  . LAPAROSCOPIC APPENDECTOMY  01/02/2012   Procedure: APPENDECTOMY LAPAROSCOPIC;  Surgeon: Harl Bowie, MD;  Location: [facility];  Service: General;  Laterality: N/A;  . LAPAROSCOPIC ASSISTED VAGINAL HYSTERECTOMY  11/13/2011   Procedure: LAPAROSCOPIC ASSISTED VAGINAL HYSTERECTOMY;  Surgeon: Darlyn Chamber, MD;  Location: [facility];  Service: Gynecology;  Laterality: N/A;  . LAPAROSCOPY  05/30/2011   Procedure: LAPAROSCOPY OPERATIVE;  Surgeon: Darlyn Chamber, MD;  Location: [facility];  Service: Gynecology;  Laterality: N/A;  YAG  LASER of Endometriosis.  Marland Kitchen NASAL SINUS SURGERY  2010  . PARTIAL COLECTOMY  01/27/2012    Family History  Problem Relation Age of Onset  . Lung cancer Father   . Alcohol abuse Father   . Depression Mother   . Asthma Mother   . Other Mother   . Depression Sister   . Suicidality Brother   . Depression Brother   . Alcohol abuse Brother   . Drug abuse Brother   . Depression Sister   . Colon cancer Paternal Grandmother   . Asthma Sister   .  Anesthesia problems Neg Hx     Social History   Socioeconomic History  . Marital status: Married    Spouse name: Not on file  . Number of children: 3  . Years of education: Not on file  . Highest education level: Not on file  Occupational History  . Occupation: Programmer, multimedia: [employer]  Tobacco Use  . Smoking status: Former Smoker    Packs/day: 0.75    Years: 1.00    Pack years: 0.75    Types: Cigarettes    Quit date: 2017    Years since quitting: 5.3  . Smokeless tobacco: Never Used  Vaping Use  . Vaping Use: Never used  Substance and Sexual Activity  . Alcohol use: No  . Drug use: No  . Sexual activity: Yes    Partners: Male    Birth control/protection: Surgical  Other Topics Concern  . Not on file  Social History Narrative  . Not on file   Social Determinants of Health   Financial Resource Strain: Not on file  Food Insecurity: Not on file  Transportation Needs: Not on file  Physical Activity: Not on file  Stress: Not on file  Social Connections: Not on file  Intimate Partner Violence: Not on file    Outpatient Medications Prior to Visit  Medication Sig Dispense Refill  . ALPRAZolam (XANAX) 0.5 MG tablet Take 0.5-1 tablets (0.25-0.5 mg total) by mouth daily as needed for anxiety (panic). 30 tablet 2  . azelastine (ASTELIN) 0.1 % nasal spray Place 2 sprays into both nostrils 2 (two) times daily. Use in each nostril as directed (Patient not taking: Reported on 06/20/2020) 30 mL 12  . butalbital-acetaminophen-caffeine (FIORICET) 50-325-40 MG tablet Take 1-2 tablets by mouth every 6 (six) hours as needed for headache or migraine (max 3 tablets in 24 hours). 15 tablet 0  . cyclobenzaprine (FLEXERIL) 10 MG tablet Take 0.5-1 tablets (5-10 mg total) by mouth 3 (three) times daily as needed for muscle spasms. Caution: can cause drowsiness 60 tablet 1  . esomeprazole (NEXIUM) 20 MG capsule Take 20 mg by mouth daily at 12 noon.    Marland Kitchen FLUoxetine (PROZAC) 40 MG capsule Take  40 mg by mouth daily.    Marland Kitchen HYDROcodone-acetaminophen (NORCO/VICODIN) 5-325 MG tablet Take 1-2 tablets by mouth every 4 (four) hours as needed for moderate pain or severe pain. 20 tablet 0  . hydrOXYzine (ATARAX/VISTARIL) 25 MG tablet Take 1 tablet (25 mg total) by mouth at bedtime as needed (for insomnia). 30 tablet 0  . ibuprofen (ADVIL) 800 MG tablet Take 1 tablet (800 mg total) by mouth every 8 (eight) hours as needed. 60 tablet 2  . meclizine (ANTIVERT) 25 MG tablet Take 1 tablet (25 mg total) by mouth 3 (three) times daily as needed  for dizziness. 30 tablet 0  . predniSONE (DELTASONE) 20 MG tablet Take 2 tablets daily with breakfast. 10 tablet 0  . Probiotic Product (PROBIOTIC PO) Take 1 tablet by mouth daily.     Marland Kitchen topiramate (TOPAMAX) 100 MG tablet Take 1 tablet (100 mg total) by mouth 2 (two) times daily. 180 tablet 3  . albuterol (VENTOLIN HFA) 108 (90 Base) MCG/ACT inhaler Inhale 1-2 puffs into the lungs every 4 (four) hours as needed for wheezing or shortness of breath. 18 g 3  . levothyroxine (EUTHYROX) 112 MCG tablet Take 1 tablet (112 mcg total) by mouth daily before breakfast. 90 tablet 3   No facility-administered medications prior to visit.    Allergies  Allergen Reactions  . 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  . Reglan [Metoclopramide] Other (See Comments)    Caused a dystonic reaction.  . Cetacaine [Butamben-Tetracaine-Benzocaine] Nausea And Vomiting  . Ciprofloxacin Nausea And Vomiting  . Clarithromycin Hives  . Moxifloxacin Hives    Review of Systems All review of systems negative except what is listed in the HPI     Objective:    Physical Exam Vitals reviewed.  Constitutional:      Appearance: Normal appearance. She is obese.  HENT:     Head: Normocephalic and atraumatic.  Cardiovascular:     Rate and Rhythm: Normal rate and regular rhythm.     Pulses: Normal pulses.     Heart sounds: Normal heart sounds.  Pulmonary:     Effort:  Pulmonary effort is normal.     Breath sounds: Normal breath sounds.  Abdominal:     Palpations: Abdomen is soft.  Musculoskeletal:        General: Normal range of motion.     Cervical back: Normal range of motion.  Skin:    General: Skin is warm and dry.     Capillary Refill: Capillary refill takes less than 2 seconds.  Neurological:     General: No focal deficit present.     Mental Status: She is alert and oriented to person, place, and time. Mental status is at baseline.  Psychiatric:        Mood and Affect: Mood normal.        Behavior: Behavior normal.        Thought Content: Thought content normal.        Judgment: Judgment normal.     BP 137/87   Pulse 79   Resp 18   Wt 235 lb 1.6 oz (106.6 kg)   LMP 11/02/2011 (Exact Date)   SpO2 100%   BMI 41.65 kg/m  Wt Readings from Last 3 Encounters:  08/24/20 235 lb 1.6 oz (106.6 kg)  06/20/20 232 lb 1.9 oz (105.3 kg)  05/23/20 229 lb (103.9 kg)    Health Maintenance Due  Topic Date Due  . HIV Screening  Never done  . MAMMOGRAM  Never done  . Hepatitis C Screening  Never done  . COLONOSCOPY (Pts 45-20yrs Insurance coverage will need to be confirmed)  01/21/2014    There are no preventive care reminders to display for this patient.   Lab Results  Component Value Date   TSH 3.69 04/28/2019   Lab Results  Component Value Date   WBC 11.4 (H) 08/03/2019   HGB 13.2 08/03/2019   HCT 39.7 08/03/2019   MCV 94.5 08/03/2019   PLT 276 08/03/2019   Lab Results  Component Value Date   NA 140 08/03/2019  K 4.1 08/03/2019   CO2 26 08/03/2019   GLUCOSE 126 (H) 08/03/2019   BUN 14 08/03/2019   CREATININE 0.91 08/03/2019   BILITOT 0.5 06/11/2019   ALKPHOS 74 06/11/2019   AST 23 06/11/2019   ALT 26 06/11/2019   PROT 7.7 06/11/2019   ALBUMIN 4.2 06/11/2019   CALCIUM 9.6 08/03/2019   ANIONGAP 10 08/03/2019   GFR 82.74 03/22/2011   Lab Results  Component Value Date   CHOL 212 (H) 09/10/2018   Lab Results   Component Value Date   HDL 68 09/10/2018   Lab Results  Component Value Date   LDLCALC 125 (H) 09/10/2018   Lab Results  Component Value Date   TRIG 93 09/10/2018   Lab Results  Component Value Date   CHOLHDL 3.1 09/10/2018   Lab Results  Component Value Date   HGBA1C 5.5 09/25/2016       Assessment & Plan:   1. Moderate persistent asthma, unspecified whether complicated Patient needing to step on asthma management scale given that she is requiring albuterol 3x/day and has worsening night time symptoms. Will start with low dose Flovent BID and see how she responds. Refilling albuterol inhaler. Discussed asthma action plan and signs and symptoms requiring further evaluation.   - fluticasone (FLOVENT HFA) 44 MCG/ACT inhaler; Inhale 2 puffs into the lungs in the morning and at bedtime.  Dispense: 1 each; Refill: 12 - albuterol (VENTOLIN HFA) 108 (90 Base) MCG/ACT inhaler; Inhale 1-2 puffs into the lungs every 4 (four) hours as needed for wheezing or shortness of breath.  Dispense: 18 g; Refill: 3   2. Fatigue, unspecified type 3. Class 1 obesity in adult, unspecified BMI, unspecified obesity type, unspecified whether serious comorbidity present 4. Hypothyroidism due to Hashimoto's thyroiditis  Patient had lab orders from December that are still active. Reprinting lab form for her to get them drawn today (CBC, CMP, TSH, lipids, Vit D, B12). These results will help narrow down some causes of her fatigue and possibly her struggle to lose weight. Continue current weight loss regimen as she is noticing changes in how her clothes are fitting and this should be an encouragement! Will update patient with results and any changes to plan of care. Patient aware of signs/symptoms requiring further evaluation.  - levothyroxine (EUTHYROX) 112 MCG tablet; Take 1 tablet (112 mcg total) by mouth daily before breakfast.  Dispense: 90 tablet; Refill: 3  Follow-up if symptoms worsen or fail to  improve.   Terrilyn Saver, NP

## 2020-08-25 ENCOUNTER — Other Ambulatory Visit: Payer: Self-pay | Admitting: Osteopathic Medicine

## 2020-08-25 DIAGNOSIS — E038 Other specified hypothyroidism: Secondary | ICD-10-CM

## 2020-08-25 LAB — CBC
HCT: 36.7 % (ref 35.0–45.0)
Hemoglobin: 12.2 g/dL (ref 11.7–15.5)
MCH: 31.3 pg (ref 27.0–33.0)
MCHC: 33.2 g/dL (ref 32.0–36.0)
MCV: 94.1 fL (ref 80.0–100.0)
MPV: 11.3 fL (ref 7.5–12.5)
Platelets: 253 10*3/uL (ref 140–400)
RBC: 3.9 10*6/uL (ref 3.80–5.10)
RDW: 12.9 % (ref 11.0–15.0)
WBC: 6.1 10*3/uL (ref 3.8–10.8)

## 2020-08-25 LAB — COMPLETE METABOLIC PANEL WITH GFR
AG Ratio: 1.5 (calc) (ref 1.0–2.5)
ALT: 19 U/L (ref 6–29)
AST: 19 U/L (ref 10–35)
Albumin: 4.1 g/dL (ref 3.6–5.1)
Alkaline phosphatase (APISO): 89 U/L (ref 37–153)
BUN: 16 mg/dL (ref 7–25)
CO2: 25 mmol/L (ref 20–32)
Calcium: 9.1 mg/dL (ref 8.6–10.4)
Chloride: 107 mmol/L (ref 98–110)
Creat: 0.97 mg/dL (ref 0.50–1.05)
GFR, Est African American: 79 mL/min/{1.73_m2} (ref >=60)
GFR, Est Non African American: 68 mL/min/{1.73_m2} (ref >=60)
Globulin: 2.8 g/dL (calc) (ref 1.9–3.7)
Glucose, Bld: 95 mg/dL (ref 65–99)
Potassium: 4.5 mmol/L (ref 3.5–5.3)
Sodium: 138 mmol/L (ref 135–146)
Total Bilirubin: 0.4 mg/dL (ref 0.2–1.2)
Total Protein: 6.9 g/dL (ref 6.1–8.1)

## 2020-08-25 LAB — TSH: TSH: 12.62 mIU/L — ABNORMAL HIGH

## 2020-08-25 LAB — VITAMIN D 25 HYDROXY (VIT D DEFICIENCY, FRACTURES): Vit D, 25-Hydroxy: 31 ng/mL (ref 30–100)

## 2020-08-25 MED ORDER — LEVOTHYROXINE SODIUM 125 MCG PO TABS: 125.0000 ug | ORAL_TABLET | Freq: Every day | ORAL | 0 refills | Status: DC

## 2020-09-08 ENCOUNTER — Ambulatory Visit: Payer: Self-pay

## 2020-09-08 ENCOUNTER — Other Ambulatory Visit: Payer: Self-pay

## 2020-09-08 ENCOUNTER — Ambulatory Visit (HOSPITAL_COMMUNITY)
Admission: EM | Admit: 2020-09-08 | Discharge: 2020-09-08 | Disposition: A | Discharge status: Home / Self Care | Payer: BC Managed Care – PPO

## 2020-09-08 ENCOUNTER — Encounter (HOSPITAL_COMMUNITY): Payer: Self-pay

## 2020-09-08 DIAGNOSIS — K047 Periapical abscess without sinus: Secondary | ICD-10-CM | POA: Diagnosis not present

## 2020-09-08 MED ORDER — HYDROCODONE-ACETAMINOPHEN 5-325 MG PO TABS: 2.0000 | ORAL_TABLET | ORAL | 0 refills | Status: DC | PRN

## 2020-09-08 MED ORDER — AMOXICILLIN-POT CLAVULANATE 875-125 MG PO TABS: 1.0000 | ORAL_TABLET | Freq: Three times a day (TID) | ORAL | 0 refills | Status: DC
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
## 2020-09-08 NOTE — ED Triage Notes (Signed)
Pt reports dental pain x 4 days. Tylenol 1000 mg and ibuprofen 800 mg gives no relief.

## 2020-09-09 ENCOUNTER — Telehealth (HOSPITAL_COMMUNITY): Payer: Self-pay | Admitting: Emergency Medicine

## 2020-09-09 NOTE — Telephone Encounter (Signed)
Spoke to [facility].  The issue is the way script written, surpasses MME/day.  Verified maximum number of tablets in a 24 hour, this is below MME/day. Spoke to [place], NP, agreed with rewording of script.

## 2020-09-13 ENCOUNTER — Ambulatory Visit (INDEPENDENT_AMBULATORY_CARE_PROVIDER_SITE_OTHER): Payer: BC Managed Care – PPO | Admitting: Physician Assistant

## 2020-09-13 ENCOUNTER — Encounter: Payer: Self-pay | Admitting: Physician Assistant

## 2020-09-13 ENCOUNTER — Other Ambulatory Visit: Payer: Self-pay

## 2020-09-13 DIAGNOSIS — N760 Acute vaginitis: Secondary | ICD-10-CM | POA: Diagnosis not present

## 2020-09-13 DIAGNOSIS — K644 Residual hemorrhoidal skin tags: Secondary | ICD-10-CM

## 2020-09-13 DIAGNOSIS — Z113 Encounter for screening for infections with a predominantly sexual mode of transmission: Secondary | ICD-10-CM

## 2020-09-13 MED ORDER — FLUCONAZOLE 150 MG PO TABS: 150.0000 mg | ORAL_TABLET | Freq: Once | ORAL | 0 refills | Status: AC
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
## 2020-09-13 NOTE — Patient Instructions (Signed)
Skin Tag, Adult  A skin tag (acrochordon) is a soft, extra growth of skin. Most skin tags are skin-colored and rarely bigger than a pencil eraser. They commonly form in areas where there is frequent rubbing, or friction, on the skin. This may be where there are folds in the skin, such as the eyelids, neck, armpit, or groin. Skin tags are not dangerous, and they do not spread from person to person (are not contagious). You may have one skin tag or several. Skin tags do not require treatment. However, your health care provider may recommend removal of a skin tag if it:  Gets irritated from clothing or jewelry.  Bleeds.  Is visible and unsightly. What are the causes? This condition is linked with:  Increasing age.  Pregnancy.  Diabetes.  Obesity. What are the signs or symptoms? Skin tags usually do not cause symptoms unless they get irritated by items touching your skin, such as clothing or jewelry. When this happens, you may have pain, itching, or bleeding. How is this diagnosed? This condition is diagnosed with an evaluation from your health care provider. No testing is needed for diagnosis. How is this treated? Treatment for this condition depends on whether you have symptoms. If a skin tag needs to be removed, your health care provider can remove it with:  A simple surgical procedure using scissors.  A procedure that involves freezing your skin tag with a gas in liquid form (liquid nitrogen).  A procedure that uses heat to destroy your skin tag (electrodessication). Your health care provider may also remove your skin tag if it is visible or unsightly, Follow these instructions at home:  Watch for any changes in your skin tag. A normal skin tag does not require any other special care at home.  Take over-the-counter and prescription medicines only as told by your health care provider.  Keep all follow-up visits as told by your health care provider. This is important. Contact a  health care provider if:  You have a skin tag that: ? Becomes painful. ? Changes color. ? Bleeds. ? Swells. Summary  Skin tags are soft, extra growths of skin found in areas of frequent rubbing or friction.  Skin tags usually do not cause symptoms. If symptoms occur, you may have pain, itching, or bleeding.  If your skin tag causes symptoms or is unsightly, your health care provider can remove it. This information is not intended to replace advice given to you by your health care provider. Make sure you discuss any questions you have with your health care provider. Document Revised: 02/01/2019 Document Reviewed: 02/01/2019 Elsevier Patient Education  [place].

## 2020-09-15 DIAGNOSIS — N760 Acute vaginitis: Secondary | ICD-10-CM | POA: Insufficient documentation

## 2020-11-10 ENCOUNTER — Telehealth: Payer: Self-pay | Admitting: *Deleted

## 2020-11-10 DIAGNOSIS — E038 Other specified hypothyroidism: Secondary | ICD-10-CM

## 2020-11-10 DIAGNOSIS — E063 Autoimmune thyroiditis: Secondary | ICD-10-CM

## 2020-11-10 NOTE — Telephone Encounter (Signed)
TSH ordered per pt request.

## 2020-12-06 ENCOUNTER — Other Ambulatory Visit: Payer: Self-pay

## 2020-12-06 ENCOUNTER — Encounter: Payer: Self-pay | Admitting: Physician Assistant

## 2020-12-06 ENCOUNTER — Ambulatory Visit (INDEPENDENT_AMBULATORY_CARE_PROVIDER_SITE_OTHER): Payer: BC Managed Care – PPO | Admitting: Physician Assistant

## 2020-12-06 VITALS — BP 129/72 | HR 84 | Temp 98.5°F | Ht 63.0 in | Wt 215.0 lb

## 2020-12-06 DIAGNOSIS — N3289 Other specified disorders of bladder: Secondary | ICD-10-CM

## 2020-12-06 DIAGNOSIS — R350 Frequency of micturition: Secondary | ICD-10-CM

## 2020-12-06 DIAGNOSIS — N12 Tubulo-interstitial nephritis, not specified as acute or chronic: Secondary | ICD-10-CM

## 2020-12-06 DIAGNOSIS — R8290 Unspecified abnormal findings in urine: Secondary | ICD-10-CM | POA: Diagnosis not present

## 2020-12-06 LAB — POCT URINALYSIS DIP (CLINITEK)
Bilirubin, UA: NEGATIVE
Glucose, UA: 100 mg/dL — AB
Ketones, POC UA: NEGATIVE mg/dL
Nitrite, UA: POSITIVE — AB
POC PROTEIN,UA: 100 — AB
Spec Grav, UA: 1.02 (ref 1.010–1.025)
Urobilinogen, UA: 1 E.U./dL
pH, UA: 6 (ref 5.0–8.0)

## 2020-12-06 MED ORDER — CEFTRIAXONE SODIUM 1 G IJ SOLR
1.0000 g | Freq: Once | INTRAMUSCULAR | Status: AC
  Administered 2020-12-06: 1 g via INTRAMUSCULAR

## 2020-12-06 MED ORDER — SULFAMETHOXAZOLE-TRIMETHOPRIM 800-160 MG PO TABS: 1.0000 | ORAL_TABLET | Freq: Two times a day (BID) | ORAL | 0 refills | Status: DC

## 2020-12-06 NOTE — Patient Instructions (Signed)
Pyelonephritis, Adult Pyelonephritis is an infection that occurs in the kidney. The kidneys are the organs that filter a person's blood and move waste out of the bloodstream and into the urine. Urine passes from the kidneys, through tubes called ureters, and into the bladder. There are two main types of pyelonephritis: Infections that come on quickly without any warning (acute pyelonephritis). Infections that last for a long period of time (chronic pyelonephritis). In most cases, the infection clears up with treatment and does not cause further problems. More severe infections or chronic infections can sometimesspread to the bloodstream or lead to other problems with the kidneys. What are the causes? This condition is usually caused by: Bacteria traveling from the bladder up to the kidney. This may occur after having a bladder infection (cystitis) or urinary tract infection (UTI). Bladder infections caused from bacteria traveling from the bloodstream to the kidney. What increases the risk? This condition is more likely to develop in: Pregnant women. Older people. People who have any of these conditions: Diabetes. Inflammation of the prostate gland (prostatitis), in males. Kidney stones or bladder stones. Other abnormalities of the kidney or ureter. Cancer. People who have a catheter placed in the bladder. People who are sexually active. Women who use spermicides. People who have had a prior UTI. What are the signs or symptoms? Symptoms of this condition include: Frequent urination. Strong or persistent urge to urinate. Burning or stinging when urinating. Abdominal pain. Back pain. Pain in the side or flank area. Fever or chills. Blood in the urine, or dark urine. Nausea or vomiting. How is this diagnosed? This condition may be diagnosed based on: Your medical history and a physical exam. Urine tests. Blood tests. You may also have imaging tests of the kidneys, such as an  ultrasound or CTscan. How is this treated? Treatment for this condition may depend on the severity of the infection. If the infection is mild and is found early, you may be treated with antibiotic medicines taken by mouth (orally). You will need to drink fluids to remain hydrated. If the infection is more severe, you may need to stay in the hospital and receive antibiotics given directly into a vein through an IV. You may also need to receive fluids through an IV if you are not able to remain hydrated. After your hospital stay, you may need to take oral antibiotics for a period of time. Other treatments may be required, depending on the cause of the infection. Follow these instructions at home: Medicines Take your antibiotic medicine as told by your health care provider. Do not stop taking the antibiotic even if you start to feel better. Take over-the-counter and prescription medicines only as told by your health care provider. General instructions  Drink enough fluid to keep your urine pale yellow. Avoid caffeine, tea, and carbonated beverages. They tend to irritate the bladder. Urinate often. Avoid holding in urine for long periods of time. Urinate before and after sex. After a bowel movement, women should cleanse from front to back. Use each tissue only once. Keep all follow-up visits as told by your health care provider. This is important.  Contact a health care provider if: Your symptoms do not get better after 2 days of treatment. Your symptoms get worse. You have a fever. Get help right away if you: Are unable to take your antibiotics or fluids. Have shaking chills. Vomit. Have severe flank or back pain. Have extreme weakness or fainting. Summary Pyelonephritis is a urinary tract infection (UTI)  that occurs in the kidney. Treatment for this condition may depend on the severity of the infection. Take your antibiotic medicine as told by your health care provider. Do not stop  taking the antibiotic even if you start to feel better. Drink enough fluid to keep your urine pale yellow. Keep all follow-up visits as told by your health care provider. This is important. This information is not intended to replace advice given to you by your health care provider. Make sure you discuss any questions you have with your healthcare provider. Document Revised: 02/03/2018 Document Reviewed: 02/03/2018 Elsevier Patient Education  2022 [place].   Urinary Tract Infection, Adult  A urinary tract infection (UTI) is an infection of any part of the urinary tract. The urinary tract includes the kidneys, ureters, bladder, and urethra.These organs make, store, and get rid of urine in the body. An upper UTI affects the ureters and kidneys. A lower UTI affects the bladderand urethra. What are the causes? Most urinary tract infections are caused by bacteria in your genital area around your urethra, where urine leaves your body. These bacteria grow andcause inflammation of your urinary tract. What increases the risk? You are more likely to develop this condition if: You have a urinary catheter that stays in place. You are not able to control when you urinate or have a bowel movement (incontinence). You are female and you: Use a spermicide or diaphragm for birth control. Have low estrogen levels. Are pregnant. You have certain genes that increase your risk. You are sexually active. You take antibiotic medicines. You have a condition that causes your flow of urine to slow down, such as: An enlarged prostate, if you are female. Blockage in your urethra. A kidney stone. A nerve condition that affects your bladder control (neurogenic bladder). Not getting enough to drink, or not urinating often. You have certain medical conditions, such as: Diabetes. A weak disease-fighting system (immunesystem). Sickle cell disease. Gout. Spinal cord injury. What are the signs or  symptoms? Symptoms of this condition include: Needing to urinate right away (urgency). Frequent urination. This may include small amounts of urine each time you urinate. Pain or burning with urination. Blood in the urine. Urine that smells bad or unusual. Trouble urinating. Cloudy urine. Vaginal discharge, if you are female. Pain in the abdomen or the lower back. You may also have: Vomiting or a decreased appetite. Confusion. Irritability or tiredness. A fever or chills. Diarrhea. The first symptom in older adults may be confusion. In some cases, they may nothave any symptoms until the infection has worsened. How is this diagnosed? This condition is diagnosed based on your medical history and a physical exam. You may also have other tests, including: Urine tests. Blood tests. Tests for STIs (sexually transmitted infections). If you have had more than one UTI, a cystoscopy or imaging studies may be doneto determine the cause of the infections. How is this treated? Treatment for this condition includes: Antibiotic medicine. Over-the-counter medicines to treat discomfort. Drinking enough water to stay hydrated. If you have frequent infections or have other conditions such as a kidney stone, you may need to see a health care provider who specializes in the urinary tract (urologist). In rare cases, urinary tract infections can cause sepsis. Sepsis is a life-threatening condition that occurs when the body responds to an infection. Sepsis is treated in the hospital with IV antibiotics, fluids, and othermedicines. Follow these instructions at home:  Medicines Take over-the-counter and prescription medicines only as told by  your health care provider. If you were prescribed an antibiotic medicine, take it as told by your health care provider. Do not stop using the antibiotic even if you start to feel better. General instructions Make sure you: Empty your bladder often and completely. Do  not hold urine for long periods of time. Empty your bladder after sex. Wipe from front to back after urinating or having a bowel movement if you are female. Use each tissue only one time when you wipe. Drink enough fluid to keep your urine pale yellow. Keep all follow-up visits. This is important. Contact a health care provider if: Your symptoms do not get better after 1-2 days. Your symptoms go away and then return. Get help right away if: You have severe pain in your back or your lower abdomen. You have a fever or chills. You have nausea or vomiting. Summary A urinary tract infection (UTI) is an infection of any part of the urinary tract, which includes the kidneys, ureters, bladder, and urethra. Most urinary tract infections are caused by bacteria in your genital area. Treatment for this condition often includes antibiotic medicines. If you were prescribed an antibiotic medicine, take it as told by your health care provider. Do not stop using the antibiotic even if you start to feel better. Keep all follow-up visits. This is important. This information is not intended to replace advice given to you by your health care provider. Make sure you discuss any questions you have with your healthcare provider. Document Revised: 11/12/2019 Document Reviewed: 11/12/2019 Elsevier Patient Education  [place].
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
## 2020-12-07 LAB — TSH: TSH: 11.83 mIU/L — ABNORMAL HIGH

## 2020-12-08 ENCOUNTER — Other Ambulatory Visit: Payer: Self-pay | Admitting: Osteopathic Medicine

## 2020-12-08 ENCOUNTER — Encounter: Payer: Self-pay | Admitting: Osteopathic Medicine

## 2020-12-08 DIAGNOSIS — E038 Other specified hypothyroidism: Secondary | ICD-10-CM

## 2020-12-08 DIAGNOSIS — E063 Autoimmune thyroiditis: Secondary | ICD-10-CM

## 2020-12-08 DIAGNOSIS — N12 Tubulo-interstitial nephritis, not specified as acute or chronic: Secondary | ICD-10-CM

## 2020-12-08 HISTORY — DX: Tubulo-interstitial nephritis, not specified as acute or chronic: N12

## 2020-12-08 LAB — URINE CULTURE
MICRO NUMBER:: 12288327
SPECIMEN QUALITY:: ADEQUATE

## 2020-12-08 MED ORDER — LEVOTHYROXINE SODIUM 125 MCG PO TABS
125.0000 ug | ORAL_TABLET | Freq: Every day | ORAL | 1 refills | Status: DC
Start: 2020-12-08 — End: 2020-12-08

## 2020-12-08 MED ORDER — LEVOTHYROXINE SODIUM 137 MCG PO TABS: 137.0000 ug | ORAL_TABLET | Freq: Every day | ORAL | 0 refills | Status: DC

## 2020-12-11 ENCOUNTER — Other Ambulatory Visit: Payer: Self-pay | Admitting: Physician Assistant

## 2020-12-11 MED ORDER — CEFIXIME 400 MG PO CAPS: 400.0000 mg | ORAL_CAPSULE | Freq: Every day | ORAL | 0 refills | Status: DC

## 2020-12-11 MED ORDER — AMOXICILLIN-POT CLAVULANATE 500-125 MG PO TABS: 1.0000 | ORAL_TABLET | Freq: Two times a day (BID) | ORAL | 0 refills | Status: DC

## 2020-12-11 NOTE — Progress Notes (Signed)
E.coli found in urine. Bactrim will not treat it. Will need to switch to supraxStop bactrim.

## 2020-12-11 NOTE — Addendum Note (Signed)
Addended by: Narda Rutherford on: 12/11/2020 03:57 PM   Modules accepted: Orders

## 2020-12-13 ENCOUNTER — Encounter: Payer: Self-pay | Admitting: Physician Assistant

## 2020-12-14 NOTE — Telephone Encounter (Signed)
Ok for letter

## 2020-12-25 ENCOUNTER — Other Ambulatory Visit: Payer: Self-pay

## 2020-12-25 ENCOUNTER — Ambulatory Visit
Admission: EM | Admit: 2020-12-25 | Discharge: 2020-12-25 | Disposition: A | Discharge status: Home / Self Care | Payer: BC Managed Care – PPO | Attending: Urgent Care | Admitting: Urgent Care

## 2020-12-25 DIAGNOSIS — R109 Unspecified abdominal pain: Secondary | ICD-10-CM | POA: Diagnosis present

## 2020-12-25 DIAGNOSIS — N10 Acute tubulo-interstitial nephritis: Secondary | ICD-10-CM | POA: Insufficient documentation

## 2020-12-25 DIAGNOSIS — N12 Tubulo-interstitial nephritis, not specified as acute or chronic: Secondary | ICD-10-CM | POA: Insufficient documentation

## 2020-12-25 LAB — POCT URINALYSIS DIP (MANUAL ENTRY)
Glucose, UA: 250 mg/dL — AB
Nitrite, UA: POSITIVE — AB
Protein Ur, POC: >=300 mg/dL — AB
Spec Grav, UA: 1.01 (ref 1.010–1.025)
Urobilinogen, UA: >=8 E.U./dL — AB
pH, UA: 5 (ref 5.0–8.0)

## 2020-12-25 MED ORDER — NITROFURANTOIN MONOHYD MACRO 100 MG PO CAPS: 100.0000 mg | ORAL_CAPSULE | Freq: Two times a day (BID) | ORAL | 0 refills | Status: DC

## 2020-12-25 MED ORDER — CEFTRIAXONE SODIUM 1 G IJ SOLR
1.0000 g | Freq: Once | INTRAMUSCULAR | Status: AC
  Administered 2020-12-25: 1 g via INTRAMUSCULAR

## 2020-12-25 NOTE — ED Provider Notes (Signed)
[place]   MRN: [id number] DOB: Apr 08, 1971  Subjective:   [name] is a 50 y.o. female presenting for 3-week history of persistent bilateral flank pain, hematuria, feeling a sense of urinary urgency, dysuria, urinary frequency.  Has recently been treated for pyelonephritis with Augmentin.  She just finished this course.  However she was only receiving 500 mg of Augmentin.  This was based off of the urine culture.  Her treatment was also delayed due to not having the medication in stock.  She continues to have symptoms, is hydrating well and is taking Pyridium.  She admits that she has also not rested due to financial burden and wanting to continue working.  Denies history of sepsis, urosepsis.  She does have a history of kidney stones but states that this is worse.  No current facility-administered medications for this encounter.  Current Outpatient Medications:    acetaminophen (TYLENOL) 500 MG tablet, Take 500 mg by mouth every 6 (six) hours as needed., Disp: , Rfl:    albuterol (VENTOLIN HFA) 108 (90 Base) MCG/ACT inhaler, Inhale 1-2 puffs into the lungs every 4 (four) hours as needed for wheezing or shortness of breath., Disp: 18 g, Rfl: 3   ALPRAZolam (XANAX) 0.5 MG tablet, Take 0.5-1 tablets (0.25-0.5 mg total) by mouth daily as needed for anxiety (panic)., Disp: 30 tablet, Rfl: 2   azelastine (ASTELIN) 0.1 % nasal spray, Place 2 sprays into both nostrils 2 (two) times daily. Use in each nostril as directed, Disp: 30 mL, Rfl: 12   butalbital-acetaminophen-caffeine (FIORICET) 50-325-40 MG tablet, Take 1-2 tablets by mouth every 6 (six) hours as needed for headache or migraine (max 3 tablets in 24 hours)., Disp: 15 tablet, Rfl: 0   esomeprazole (NEXIUM) 20 MG capsule, Take 20 mg by mouth daily at 12 noon., Disp: , Rfl:    FLUoxetine (PROZAC) 40 MG capsule, Take 40 mg by mouth daily., Disp: , Rfl:    fluticasone (FLOVENT HFA) 44 MCG/ACT inhaler, Inhale 2 puffs into  the lungs in the morning and at bedtime., Disp: 1 each, Rfl: 12   ibuprofen (ADVIL) 800 MG tablet, Take 1 tablet (800 mg total) by mouth every 8 (eight) hours as needed., Disp: 60 tablet, Rfl: 2   levothyroxine (EUTHYROX) 137 MCG tablet, Take 1 tablet (137 mcg total) by mouth daily before breakfast. Due for labs when pill bottle running low!, Disp: 90 tablet, Rfl: 0   Multiple Vitamin (MULTIVITAMIN) tablet, Take 1 tablet by mouth daily., Disp: , Rfl:    Probiotic Product (PROBIOTIC PO), Take 1 tablet by mouth daily. , Disp: , Rfl:    Allergies  Allergen Reactions   Other Anaphylaxis    Per pt - allergic to all nuts.   Reglan [Metoclopramide] Other (See Comments)    Caused a dystonic reaction.   Cetacaine [Butamben-Tetracaine-Benzocaine] Nausea And Vomiting   Ciprofloxacin Nausea And Vomiting   Clarithromycin Hives   Moxifloxacin Hives    Past Medical History:  Diagnosis Date   Adenomyosis 2013   Anemia    Anemia, iron deficiency 12/23/2011   Anxiety    Asthma    Cancer of appendix ([place])    Complication of anesthesia    scoline pain? from use of Succinylcholine    Concussion syndrome 07/11/2016   Cough    Depression    Endometriosis    Generalized headaches    GERD (gastroesophageal reflux disease)    Heart murmur    History of sinus surgery 2010  MAXILLARY, ETHMOID, SPHENOID   Hypothyroidism    Iron deficiency anemia    Kidney stones    Migraine    Obesity, Class III, BMI 40-49.9 (morbid obesity) (HCC)    SVD (spontaneous vaginal delivery)    x3   Vitamin D deficiency 02/02/2017     Past Surgical History:  Procedure Laterality Date   ABDOMINAL HYSTERECTOMY     CHOLECYSTECTOMY  01/27/2012   Procedure: LAPAROSCOPIC CHOLECYSTECTOMY;  Surgeon: Harl Bowie, MD;  Location: [facility];  Service: General;  Laterality: N/A;  Laparoscopic chole   COLPOSCOPY  2007   KNEE SURGERY     right, scope and ACL repair   LAPAROSCOPIC APPENDECTOMY  01/02/2012   Procedure:  APPENDECTOMY LAPAROSCOPIC;  Surgeon: Harl Bowie, MD;  Location: [facility];  Service: General;  Laterality: N/A;   LAPAROSCOPIC ASSISTED VAGINAL HYSTERECTOMY  11/13/2011   Procedure: LAPAROSCOPIC ASSISTED VAGINAL HYSTERECTOMY;  Surgeon: Darlyn Chamber, MD;  Location: [facility];  Service: Gynecology;  Laterality: N/A;   LAPAROSCOPY  05/30/2011   Procedure: LAPAROSCOPY OPERATIVE;  Surgeon: Darlyn Chamber, MD;  Location: [facility];  Service: Gynecology;  Laterality: N/A;  YAG  LASER of Endometriosis.   NASAL SINUS SURGERY  2010   PARTIAL COLECTOMY  01/27/2012    Family History  Problem Relation Age of Onset   Lung cancer Father    Alcohol abuse Father    Depression Mother    Asthma Mother    Other Mother    Depression Sister    Suicidality Brother    Depression Brother    Alcohol abuse Brother    Drug abuse Brother    Depression Sister    Colon cancer Paternal Grandmother    Asthma Sister    Anesthesia problems Neg Hx     Social History   Tobacco Use   Smoking status: Former    Packs/day: 0.75    Years: 1.00    Pack years: 0.75    Types: Cigarettes    Quit date: 2017    Years since quitting: 5.6   Smokeless tobacco: Never  Vaping Use   Vaping Use: Never used  Substance Use Topics   Alcohol use: No   Drug use: No    ROS   Objective:   Vitals: BP (!) 134/97 (BP Location: Left Arm)   Pulse 99   Temp 98.3 F (36.8 C) (Oral)   Resp 18   LMP 11/02/2011 (Exact Date)   SpO2 99%   Physical Exam Constitutional:      General: She is not in acute distress.    Appearance: Normal appearance. She is well-developed. She is not ill-appearing, toxic-appearing or diaphoretic.  HENT:     Head: Normocephalic and atraumatic.     Nose: Nose normal.     Mouth/Throat:     Mouth: Mucous membranes are moist.     Pharynx: Oropharynx is clear.  Eyes:     General: No scleral icterus.       Right eye: No discharge.        Left eye: No discharge.     Extraocular Movements: Extraocular  movements intact.     Conjunctiva/sclera: Conjunctivae normal.     Pupils: Pupils are equal, round, and reactive to light.  Cardiovascular:     Rate and Rhythm: Normal rate.  Pulmonary:     Effort: Pulmonary effort is normal.  Abdominal:     General: Bowel sounds are normal. There is no distension.     Palpations:  Abdomen is soft. There is no mass.     Tenderness: There is no abdominal tenderness. There is right CVA tenderness and left CVA tenderness. There is no guarding or rebound.  Skin:    General: Skin is warm and dry.  Neurological:     General: No focal deficit present.     Mental Status: She is alert and oriented to person, place, and time.  Psychiatric:        Mood and Affect: Mood normal.        Behavior: Behavior normal.        Thought Content: Thought content normal.        Judgment: Judgment normal.    Results for orders placed or performed during the hospital encounter of 12/25/20 (from the past 24 hour(s))  POCT urinalysis dipstick     Status: Abnormal   Collection Time: 12/25/20  6:14 PM  Result Value Ref Range   Color, UA orange (A) yellow   Clarity, UA cloudy (A) clear   Glucose, UA =250 (A) negative mg/dL   Bilirubin, UA moderate (A) negative   Ketones, POC UA small (15) (A) negative mg/dL   Spec Grav, UA 1.010 1.010 - 1.025   Blood, UA trace-intact (A) negative   pH, UA 5.0 5.0 - 8.0   Protein Ur, POC >=300 (A) negative mg/dL   Urobilinogen, UA >=8.0 (A) 0.2 or 1.0 E.U./dL   Nitrite, UA Positive (A) Negative   Leukocytes, UA Large (3+) (A) Negative   Recent Results (from the past 2160 hour(s))  TSH     Status: Abnormal   Collection Time: 12/06/20 12:00 AM  Result Value Ref Range   TSH 11.83 (H) mIU/L    Comment:           Reference Range .           > or = 20 Years  0.40-4.50 .                Pregnancy Ranges           First trimester    0.26-2.66           Second trimester   0.55-2.73           Third trimester    0.43-2.91   POCT URINALYSIS  DIP (CLINITEK)     Status: Abnormal   Collection Time: 12/06/20  2:39 PM  Result Value Ref Range   Color, UA orange (A) yellow   Clarity, UA clear clear   Glucose, UA =100 (A) negative mg/dL   Bilirubin, UA negative negative   Ketones, POC UA negative negative mg/dL   Spec Grav, UA 1.020 1.010 - 1.025   Blood, UA small (A) negative   pH, UA 6.0 5.0 - 8.0   POC PROTEIN,UA =100 (A) negative, trace   Urobilinogen, UA 1.0 0.2 or 1.0 E.U./dL   Nitrite, UA Positive (A) Negative   Leukocytes, UA Large (3+) (A) Negative  Urine Culture     Status: Abnormal   Collection Time: 12/06/20  2:39 PM   Specimen: Urine  Result Value Ref Range   MICRO NUMBER: [id number]    SPECIMEN QUALITY: Adequate    Sample Source NOT GIVEN    STATUS: FINAL    ISOLATE 1: Escherichia coli (A)     Comment: Greater than 100,000 CFU/mL of Escherichia coli      Susceptibility   Escherichia coli - URINE CULTURE, REFLEX    AMOX/CLAVULANIC 8 Sensitive  AMPICILLIN >=32 Resistant     AMPICILLIN/SULBACTAM 16 Intermediate     CEFAZOLIN* <=4 Not Reportable      * For infections other than uncomplicated UTI caused by E. coli, K. pneumoniae or P. mirabilis: Cefazolin is resistant if MIC > or = 8 mcg/mL. (Distinguishing susceptible versus intermediate for isolates with MIC < or = 4 mcg/mL requires additional testing.) For uncomplicated UTI caused by E. coli, K. pneumoniae or P. mirabilis: Cefazolin is susceptible if MIC <32 mcg/mL and predicts susceptible to the oral agents cefaclor, cefdinir, cefpodoxime, cefprozil, cefuroxime, cephalexin and loracarbef.     CEFTAZIDIME <=1 Sensitive     CEFEPIME <=1 Sensitive     CEFTRIAXONE <=1 Sensitive     CIPROFLOXACIN 0.5 Intermediate     LEVOFLOXACIN 1 Intermediate     GENTAMICIN <=1 Sensitive     IMIPENEM <=0.25 Sensitive     NITROFURANTOIN <=16 Sensitive     PIP/TAZO <=4 Sensitive     TOBRAMYCIN <=1 Sensitive     TRIMETH/SULFA* >=320 Resistant      * For infections  other than uncomplicated UTI caused by E. coli, K. pneumoniae or P. mirabilis: Cefazolin is resistant if MIC > or = 8 mcg/mL. (Distinguishing susceptible versus intermediate for isolates with MIC < or = 4 mcg/mL requires additional testing.) For uncomplicated UTI caused by E. coli, K. pneumoniae or P. mirabilis: Cefazolin is susceptible if MIC <32 mcg/mL and predicts susceptible to the oral agents cefaclor, cefdinir, cefpodoxime, cefprozil, cefuroxime, cephalexin and loracarbef. Legend: S = Susceptible  I = Intermediate R = Resistant  NS = Not susceptible * = Not tested  NR = Not reported **NN = See antimicrobic comments   POCT urinalysis dipstick     Status: Abnormal   Collection Time: 12/25/20  6:14 PM  Result Value Ref Range   Color, UA orange (A) yellow   Clarity, UA cloudy (A) clear   Glucose, UA =250 (A) negative mg/dL   Bilirubin, UA moderate (A) negative   Ketones, POC UA small (15) (A) negative mg/dL   Spec Grav, UA 1.010 1.010 - 1.025   Blood, UA trace-intact (A) negative   pH, UA 5.0 5.0 - 8.0   Protein Ur, POC >=300 (A) negative mg/dL   Urobilinogen, UA >=8.0 (A) 0.2 or 1.0 E.U./dL   Nitrite, UA Positive (A) Negative   Leukocytes, UA Large (3+) (A) Negative    Assessment and Plan :   PDMP not reviewed this encounter.  1. Acute pyelonephritis   2. Pyelonephritis   3. Bilateral flank pain     Will have patient discontinue Pyridium as it is not helping and this can affect the urinalysis results.  Recommended IM ceftriaxone at 1 g as she still has bilateral flank tenderness, CVA tenderness on exam.  Based off of the urine culture results we will also use Macrobid. Counseled patient on potential for adverse effects with medications prescribed/recommended today, ER and return-to-clinic precautions discussed, patient verbalized understanding.    Jaynee Eagles, [place] 12/25/20 [id number]

## 2020-12-25 NOTE — ED Triage Notes (Signed)
Pt c/o bilateral flank pain x3 weeks. States has been on 2 rounds of antibiotic for kidney infection. C/o bladder spasm, burning on urination, with urgency and frequency.

## 2020-12-27 LAB — URINE CULTURE: Culture: <10000 — AB

## 2021-02-09 ENCOUNTER — Encounter: Payer: Self-pay | Admitting: Family Medicine

## 2021-02-09 ENCOUNTER — Ambulatory Visit: Payer: Self-pay

## 2021-02-09 ENCOUNTER — Telehealth (INDEPENDENT_AMBULATORY_CARE_PROVIDER_SITE_OTHER): Payer: BC Managed Care – PPO | Admitting: Family Medicine

## 2021-02-09 DIAGNOSIS — J111 Influenza due to unidentified influenza virus with other respiratory manifestations: Secondary | ICD-10-CM | POA: Diagnosis not present

## 2021-02-09 DIAGNOSIS — R509 Fever, unspecified: Secondary | ICD-10-CM | POA: Diagnosis not present

## 2021-02-09 DIAGNOSIS — J069 Acute upper respiratory infection, unspecified: Secondary | ICD-10-CM | POA: Diagnosis not present

## 2021-02-09 LAB — POCT INFLUENZA A/B
Influenza A, POC: POSITIVE — AB
Influenza B, POC: POSITIVE — AB

## 2021-02-09 MED ORDER — OSELTAMIVIR PHOSPHATE 75 MG PO CAPS: 75.0000 mg | ORAL_CAPSULE | Freq: Two times a day (BID) | ORAL | 0 refills | Status: DC

## 2021-02-09 MED ORDER — GUAIFENESIN-CODEINE 100-10 MG/5ML PO SOLN
5.0000 mL | Freq: Three times a day (TID) | ORAL | 0 refills | Status: AC | PRN
Start: 2021-02-09 — End: 2021-02-13

## 2021-02-09 NOTE — Progress Notes (Signed)
Virtual Video Visit via MyChart Note  I connected with  [name] on 02/09/21 at 10:10 AM EDT by the video enabled telemedicine application for MyChart, and verified that I am speaking with the correct person using two identifiers.   I introduced myself as a Designer, jewellery with the practice. We discussed the limitations of evaluation and management by telemedicine and the availability of in person appointments. The patient expressed understanding and agreed to proceed.  Participating parties in this visit include: The patient and the nurse practitioner listed.  The patient is: At home I am: In the office - [place]  Subjective:    CC:  Chief Complaint  Patient presents with   Sore Throat   Fever    HPI: [name] is a 50 y.o. year old female presenting today via [place] today for URI symptoms.  Patient reports she was feeling fine during the day yesterday and went to work last night.  Within a few hours she was having extreme fatigue, head congestion, chest congestion, cough, fever of 100.1 F, body aches, dyspnea on exertion.  She went home early from work.  She tried to do home COVID test but the test was defective -no control line appeared.  She reports she has been around several people who tested positive for flu recently.  She has not yet had her flu vaccines.  She is vaccinated against COVID.  She denies any chest pain, trouble breathing at rest, rashes, nausea, vomiting, diarrhea, loss of taste or smell, urinary changes, lethargy.    Past medical history, Surgical history, Family history not pertinant except as noted below, Social history, Allergies, and medications have been entered into the medical record, reviewed, and corrections made.   Review of Systems:  All review of systems negative except what is listed in the HPI   Objective:    General:  Speaking clearly in complete sentences. Absent shortness of breath noted.  Coughing  noted. Ill-appearing, but no acute distress. Alert and oriented x3.   Normal judgment.  Absent acute distress.   Impression and Recommendations:    1. Fever, unspecified fever cause - guaiFENesin-codeine 100-10 MG/5ML syrup; Take 5 mLs by mouth 3 (three) times daily as needed for up to 4 days for cough.  Dispense: 60 mL; Refill: 0 - POCT Influenza A/B - Novel Coronavirus, NAA ([facility])  2. Viral URI with cough - guaiFENesin-codeine 100-10 MG/5ML syrup; Take 5 mLs by mouth 3 (three) times daily as needed for up to 4 days for cough.  Dispense: 60 mL; Refill: 0 - POCT Influenza A/B - Novel Coronavirus, NAA ([facility])  Patient is agreeable to drive up for flu and COVID swab.  If she is unable to find a ride, recommend she take a another home COVID test and let us know the results.  In the meantime recommend she continue supportive measures including rest, hydration, humidifier use, warm compresses, warm liquids with honey and lemon, over-the-counter cough/cold/analgesics.  We will give her a few days worth of Robitussin-AC to help with her cough and allow her to get some rest.  PDMP reviewed. 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  States she would be interested in COVID or flu antiviral therapy if positive.  She is a smoker.   Follow-up if symptoms worsen or fail to improve.    I discussed the assessment and treatment plan with the patient. The patient was provided an opportunity to ask questions  and all were answered. The patient agreed with the plan and demonstrated an understanding of the instructions.   The patient was advised to call back or seek an in-person evaluation if the symptoms worsen or if the condition fails to improve as anticipated.  I spent 20 minutes dedicated to the care of this patient on the date of this encounter to include pre-visit chart review of prior notes and results, face-to-face time with the patient, and  post-visit ordering of testing as indicated.   Terrilyn Saver, NP

## 2021-02-09 NOTE — Addendum Note (Signed)
Addended by: Caleen Jobs B on: 02/09/2021 03:04 PM   Modules accepted: Orders

## 2021-02-09 NOTE — Patient Instructions (Signed)
Flu and COVID testing order. Drive up at your convenience today during our office hours.  We will let you know results as they come in.  In the meantime, continue supportive measures including rest, hydration, humidifier use, warm liquids, over-the-counter cough/cold/analgesics.  I am sending in some cough syrup for you to take as needed.  Try to reserve for bedtime if possible.  Please contact office for sooner follow-up if symptoms do not improve or worsen. Seek emergency care if symptoms become severe.

## 2021-02-11 LAB — NOVEL CORONAVIRUS, NAA: SARS-CoV-2, NAA: NOT DETECTED

## 2021-02-11 LAB — SARS-COV-2, NAA 2 DAY TAT

## 2021-04-11 ENCOUNTER — Ambulatory Visit
Admission: RE | Admit: 2021-04-11 | Discharge: 2021-04-11 | Disposition: A | Discharge status: Home / Self Care | Payer: BC Managed Care – PPO | Source: Ambulatory Visit | Attending: Physician Assistant | Admitting: Physician Assistant

## 2021-04-11 ENCOUNTER — Other Ambulatory Visit: Payer: Self-pay

## 2021-04-11 DIAGNOSIS — J020 Streptococcal pharyngitis: Secondary | ICD-10-CM

## 2021-04-11 DIAGNOSIS — J069 Acute upper respiratory infection, unspecified: Secondary | ICD-10-CM | POA: Diagnosis not present

## 2021-04-11 LAB — POCT RAPID STREP A (OFFICE): Rapid Strep A Screen: POSITIVE — AB

## 2021-04-11 MED ORDER — AMOXICILLIN 500 MG PO CAPS: 500.0000 mg | ORAL_CAPSULE | Freq: Three times a day (TID) | ORAL | 0 refills | Status: DC
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
## 2021-04-11 NOTE — ED Triage Notes (Signed)
Two day h/o fever, fatigue, HA, body aches and onset yesterday of sore throat, cough, congestion and runny nose. Has been taking tylenol and advil with fever relief. Tmax 100.3. Emesis x3 and diarrhea x2.

## 2021-04-12 LAB — COVID-19, FLU A+B NAA
Influenza A, NAA: NOT DETECTED
Influenza B, NAA: NOT DETECTED
SARS-CoV-2, NAA: NOT DETECTED

## 2021-10-09 IMAGING — DX DG HIP (WITH OR WITHOUT PELVIS) 4+V*R*
3 series · 3 of 3 positions shown · non-contrast
Comparison: CT renal stone protocol 12/18/2017

CLINICAL DATA: Pain after fall 3 weeks ago

EXAM:
DG HIP (WITH OR WITHOUT PELVIS) 4+V RIGHT

[pelvis ap]
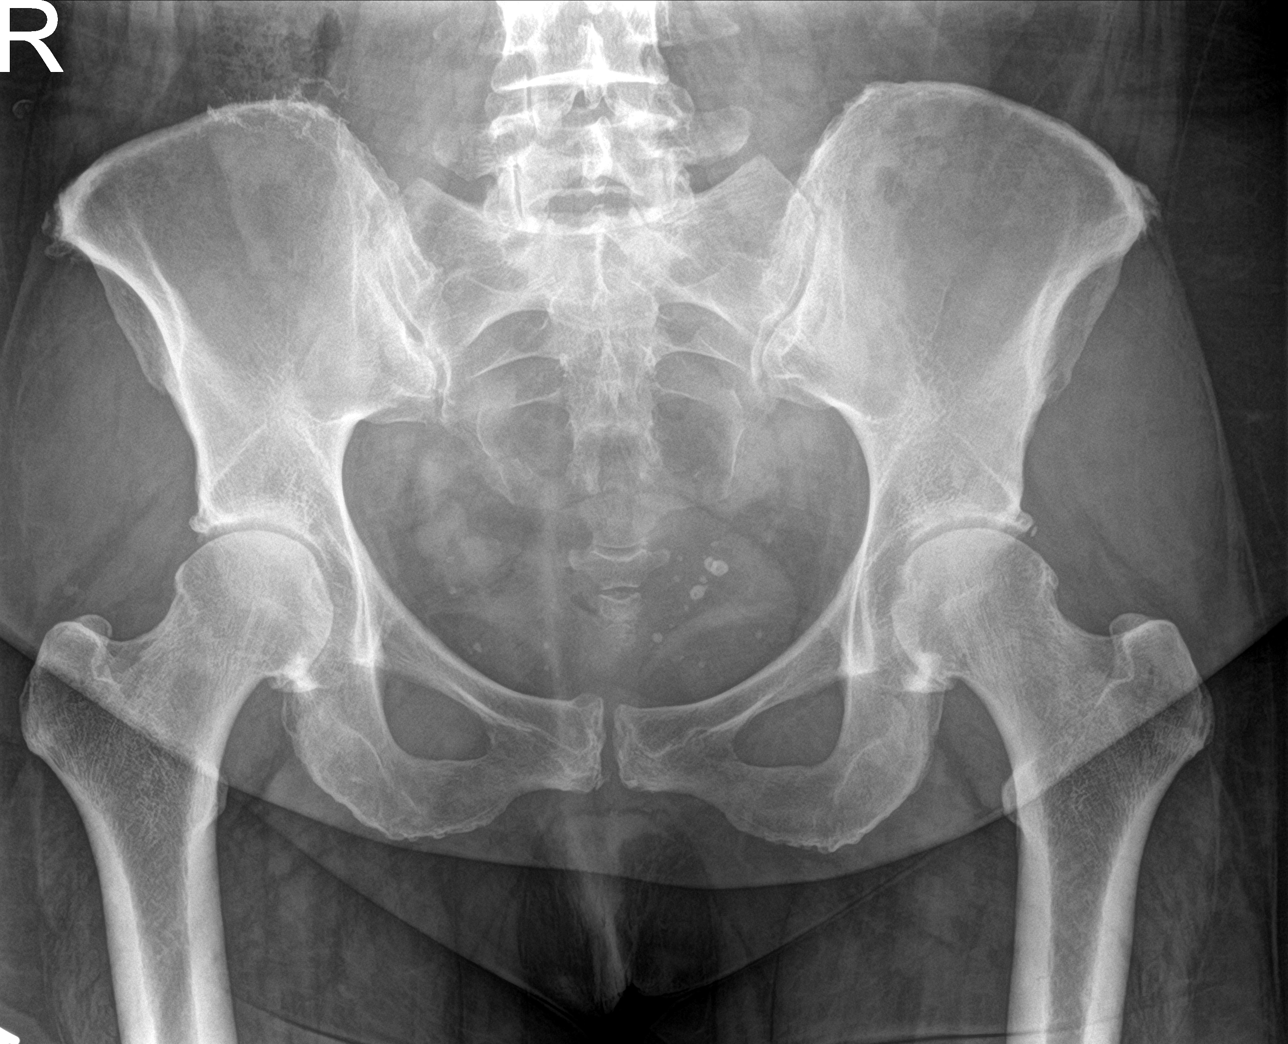

[hip ap]
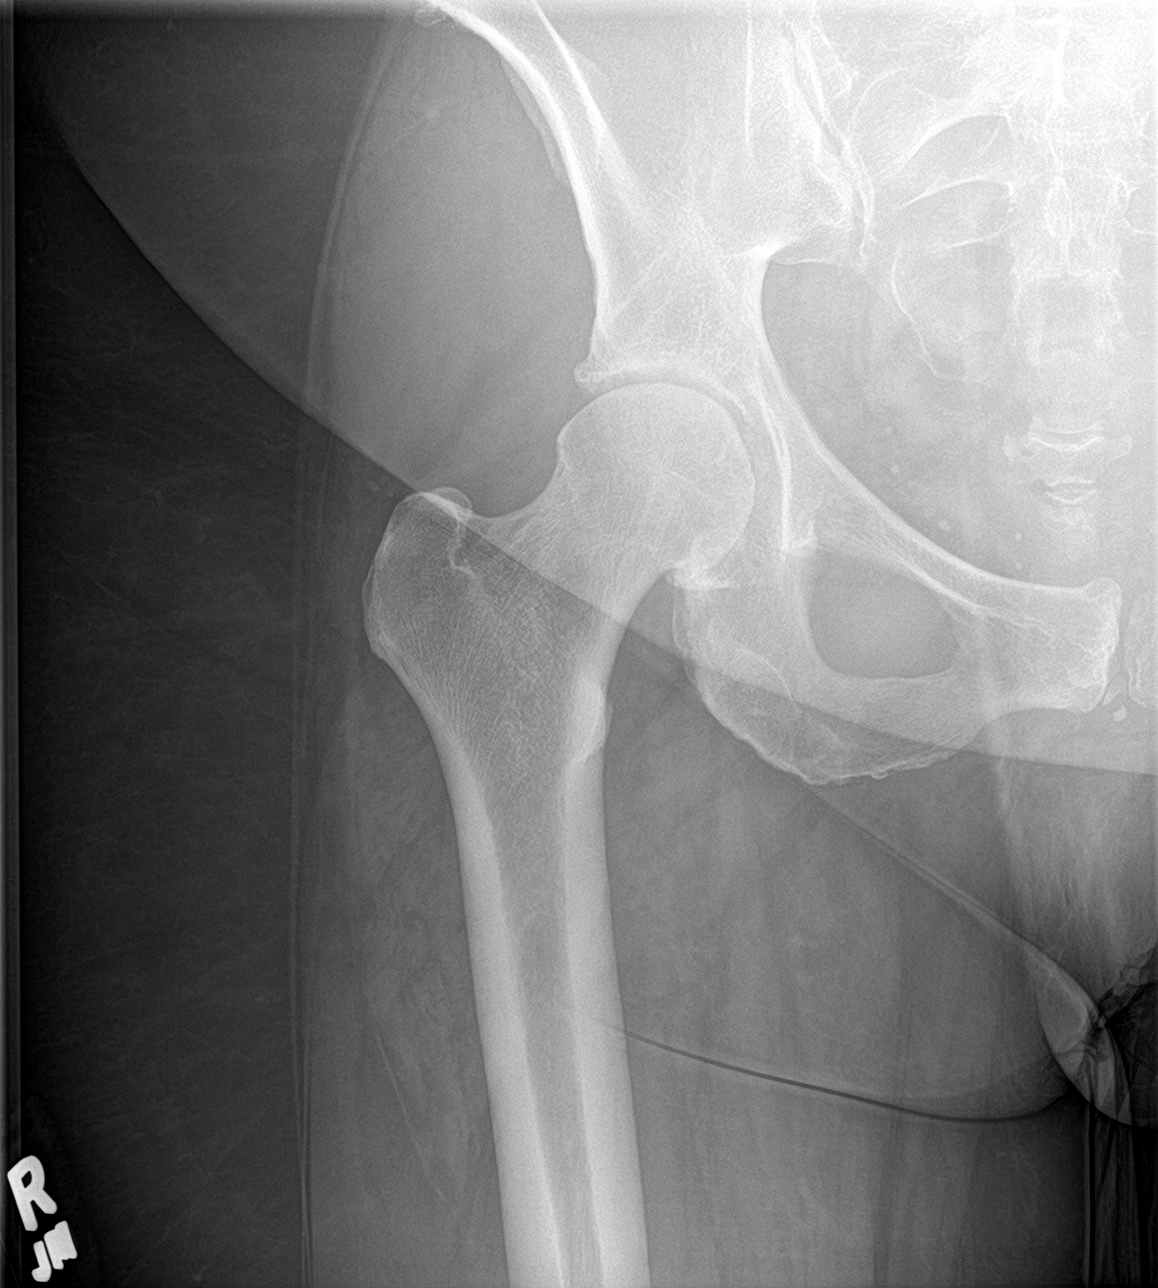

[hip frog leg]
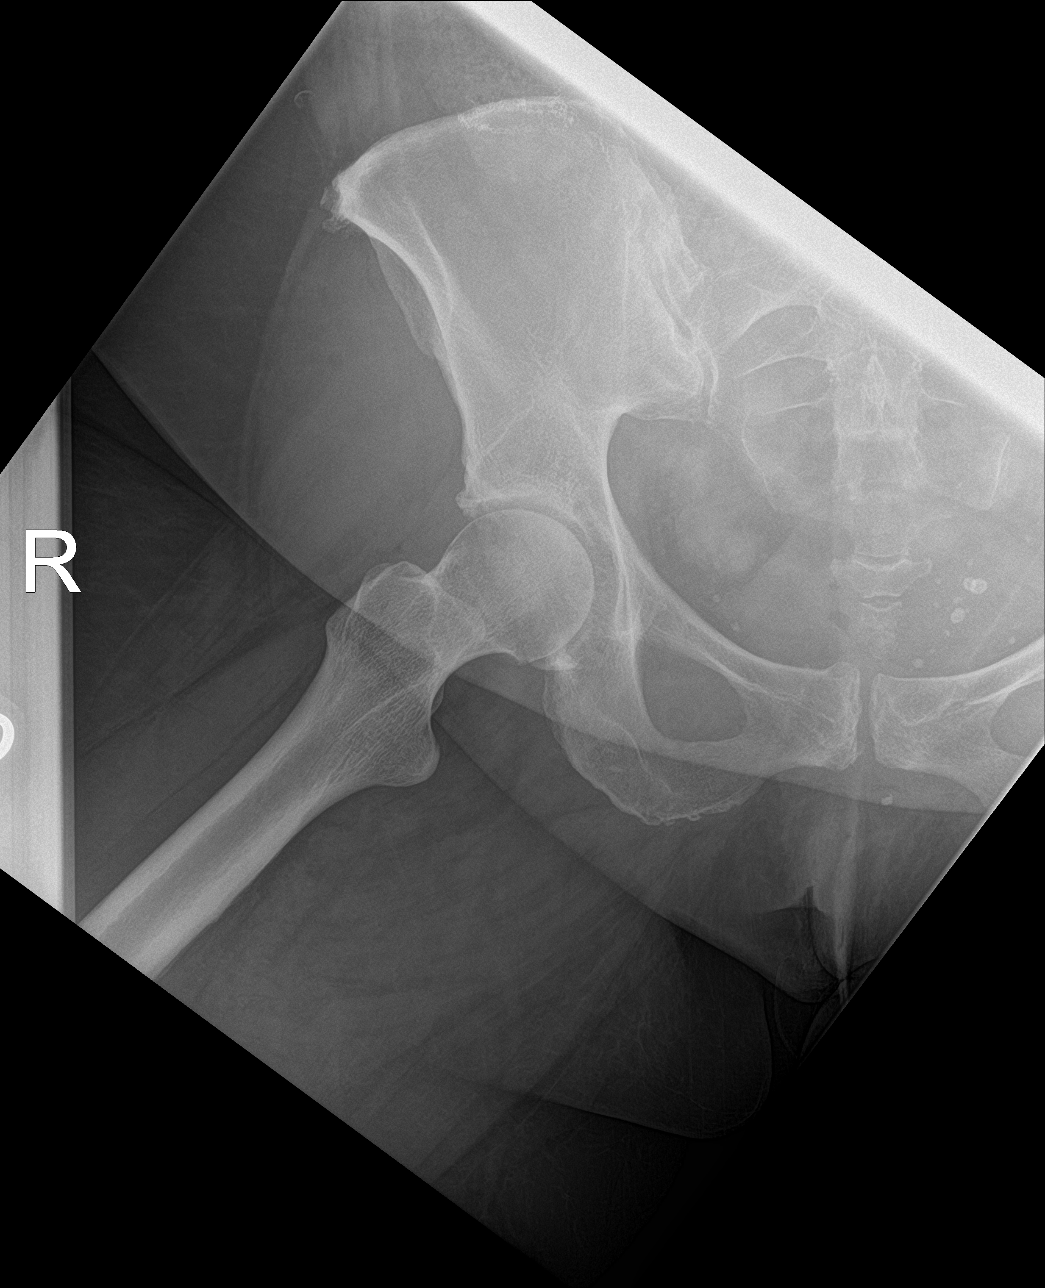

[3 of 3 positions shown; findings below may reference images not displayed]

FINDINGS: No acute fracture or dislocation. Mild bilateral hip joint space
loss and spurring. Visualized soft tissues are unremarkable.
IMPRESSION: No acute abnormality of the right hip.

## 2021-10-18 ENCOUNTER — Ambulatory Visit (INDEPENDENT_AMBULATORY_CARE_PROVIDER_SITE_OTHER): Payer: BC Managed Care – PPO

## 2021-10-18 ENCOUNTER — Ambulatory Visit
Admission: RE | Admit: 2021-10-18 | Discharge: 2021-10-18 | Disposition: A | Discharge status: Home / Self Care | Payer: BC Managed Care – PPO | Source: Ambulatory Visit | Attending: Family Medicine | Admitting: Family Medicine

## 2021-10-18 VITALS — BP 145/85 | HR 72 | Temp 98.3°F | Resp 18

## 2021-10-18 DIAGNOSIS — M79671 Pain in right foot: Secondary | ICD-10-CM

## 2021-10-18 MED ORDER — HYDROCODONE-ACETAMINOPHEN 5-325 MG PO TABS: 1.0000 | ORAL_TABLET | Freq: Four times a day (QID) | ORAL | 0 refills | Status: DC | PRN

## 2021-10-18 MED ORDER — KETOROLAC TROMETHAMINE 30 MG/ML IJ SOLN
30.0000 mg | Freq: Once | INTRAMUSCULAR | Status: AC
  Administered 2021-10-18: 30 mg via INTRAMUSCULAR

## 2021-10-18 NOTE — Discharge Instructions (Addendum)
Your x-ray showed some arthritic changes where you are hurting and possibly bursitis.  You have been given a shot of Toradol 30 mg today.  Hydrocodone 5/325--1 tablet every 6 hours as needed for pain
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
## 2021-10-18 NOTE — ED Triage Notes (Signed)
Patient presents to Urgent Care with complaints of right foot pain on dorsal side since April when she dropped something on it. Patient reports motrin, tylenol, compression, heat, ice with minimal improvement of symptom.

## 2021-10-27 ENCOUNTER — Emergency Department (HOSPITAL_COMMUNITY)
Admission: EM | Admit: 2021-10-27 | Discharge: 2021-10-27 | Disposition: A | Discharge status: Home / Self Care | Payer: BC Managed Care – PPO | Attending: Emergency Medicine | Admitting: Emergency Medicine

## 2021-10-27 ENCOUNTER — Other Ambulatory Visit: Payer: Self-pay

## 2021-10-27 ENCOUNTER — Emergency Department (HOSPITAL_COMMUNITY): Payer: BC Managed Care – PPO

## 2021-10-27 ENCOUNTER — Encounter (HOSPITAL_COMMUNITY): Payer: Self-pay

## 2021-10-27 DIAGNOSIS — R55 Syncope and collapse: Secondary | ICD-10-CM | POA: Diagnosis not present

## 2021-10-27 DIAGNOSIS — R1084 Generalized abdominal pain: Secondary | ICD-10-CM | POA: Diagnosis present

## 2021-10-27 DIAGNOSIS — R42 Dizziness and giddiness: Secondary | ICD-10-CM | POA: Diagnosis not present

## 2021-10-27 DIAGNOSIS — R112 Nausea with vomiting, unspecified: Secondary | ICD-10-CM | POA: Diagnosis not present

## 2021-10-27 DIAGNOSIS — R109 Unspecified abdominal pain: Secondary | ICD-10-CM

## 2021-10-27 LAB — I-STAT BETA HCG BLOOD, ED (MC, WL, AP ONLY): I-stat hCG, quantitative: 8.2 m[IU]/mL — ABNORMAL HIGH (ref <5)

## 2021-10-27 LAB — COMPREHENSIVE METABOLIC PANEL
ALT: 22 U/L (ref 0–44)
AST: 23 U/L (ref 15–41)
Albumin: 4.2 g/dL (ref 3.5–5.0)
Alkaline Phosphatase: 65 U/L (ref 38–126)
Anion gap: 10 (ref 5–15)
BUN: 18 mg/dL (ref 6–20)
CO2: 28 mmol/L (ref 22–32)
Calcium: 9.6 mg/dL (ref 8.9–10.3)
Chloride: 103 mmol/L (ref 98–111)
Creatinine, Ser: 1.05 mg/dL — ABNORMAL HIGH (ref 0.44–1.00)
GFR, Estimated: >60 mL/min (ref >60)
Glucose, Bld: 126 mg/dL — ABNORMAL HIGH (ref 70–99)
Potassium: 3.8 mmol/L (ref 3.5–5.1)
Sodium: 141 mmol/L (ref 135–145)
Total Bilirubin: 0.6 mg/dL (ref 0.3–1.2)
Total Protein: 7.8 g/dL (ref 6.5–8.1)

## 2021-10-27 LAB — CBC
HCT: 39.5 % (ref 36.0–46.0)
Hemoglobin: 13.3 g/dL (ref 12.0–15.0)
MCH: 31.2 pg (ref 26.0–34.0)
MCHC: 33.7 g/dL (ref 30.0–36.0)
MCV: 92.7 fL (ref 80.0–100.0)
Platelets: 262 10*3/uL (ref 150–400)
RBC: 4.26 MIL/uL (ref 3.87–5.11)
RDW: 12.8 % (ref 11.5–15.5)
WBC: 7.9 10*3/uL (ref 4.0–10.5)
nRBC: 0 % (ref 0.0–0.2)

## 2021-10-27 LAB — URINALYSIS, ROUTINE W REFLEX MICROSCOPIC
Bilirubin Urine: NEGATIVE
Glucose, UA: NEGATIVE mg/dL
Hgb urine dipstick: NEGATIVE
Ketones, ur: NEGATIVE mg/dL
Leukocytes,Ua: NEGATIVE
Nitrite: NEGATIVE
Protein, ur: NEGATIVE mg/dL
Specific Gravity, Urine: 1.016 (ref 1.005–1.030)
pH: 6 (ref 5.0–8.0)

## 2021-10-27 LAB — LIPASE, BLOOD: Lipase: 39 U/L (ref 11–51)

## 2021-10-27 MED ORDER — SODIUM CHLORIDE 0.9 % IV BOLUS
1000.0000 mL | Freq: Once | INTRAVENOUS | Status: AC
  Administered 2021-10-27: 1000 mL via INTRAVENOUS

## 2021-10-27 MED ORDER — IOHEXOL 300 MG/ML  SOLN
100.0000 mL | Freq: Once | INTRAMUSCULAR | Status: AC | PRN
  Administered 2021-10-27: 100 mL via INTRAVENOUS

## 2021-10-27 MED ORDER — SODIUM CHLORIDE 0.9 % IV SOLN
12.5000 mg | Freq: Once | INTRAVENOUS | Status: AC
  Administered 2021-10-27: 12.5 mg via INTRAVENOUS
  Filled 2021-10-27: qty 0.5

## 2021-10-27 MED ORDER — MORPHINE SULFATE (PF) 2 MG/ML IV SOLN
2.0000 mg | Freq: Once | INTRAVENOUS | Status: AC
  Administered 2021-10-27: 2 mg via INTRAVENOUS
  Filled 2021-10-27: qty 1

## 2021-10-27 NOTE — ED Provider Notes (Signed)
[facility]ERGENC[facility] DEPARTMENT Provider Note   CSN: [id number] Arrival date & time: 10/27/21  0802     History  Chief Complaint  Patient presents with   Abdominal Pain    [name] is a 51 y.o. female present emergency department of abdominal pain.  She reports that she has had nausea, vomiting, diffuse lower abdominal pain for the past 2 days, also having loose bowel movements.  She says that at work she became tachycardic and diaphoretic and nauseated.  She has been having worsening lower abdominal pain.  She also became lightheaded with tunnel vision while at work.  She reports she has had similar symptoms in the past when she gets very dehydrated.  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  She also has an abdominal surgical history of cholecystectomy and a hysterectomy.  HPI     Home Medications Prior to Admission medications   Medication Sig Start Date End Date Taking? Authorizing Provider  acetaminophen (TYLENOL) 500 MG tablet Take 500 mg by mouth every 6 (six) hours as needed.    [provider]  albuterol (VENTOLIN HFA) 108 (90 Base) MCG/ACT inhaler Inhale 1-2 puffs into the lungs every 4 (four) hours as needed for wheezing or shortness of breath. 08/24/20   Terrilyn Saver, NP  ALPRAZolam Duanne Moron) 0.5 MG tablet Take 0.5-1 tablets (0.25-0.5 mg total) by mouth daily as needed for anxiety (panic). 04/12/19   Emeterio Reeve, DO  azelastine (ASTELIN) 0.1 % nasal spray Place 2 sprays into both nostrils 2 (two) times daily. Use in each nostril as directed 04/13/20   Wieters, Hallie C, PA-C  esomeprazole (NEXIUM) 20 MG capsule Take 20 mg by mouth daily at 12 noon.    [provider]  FLUoxetine (PROZAC) 40 MG capsule Take 40 mg by mouth daily. Patient not taking: Reported on 10/18/2021 12/18/19   [provider]  fluticasone (FLOVENT HFA) 44 MCG/ACT inhaler Inhale 2 puffs into the  lungs in the morning and at bedtime. 08/24/20   Terrilyn Saver, NP  HYDROcodone-acetaminophen (NORCO/VICODIN) 5-325 MG tablet Take 1 tablet by mouth every 6 (six) hours as needed (pain). 10/18/21   Barrett Henle, MD  levothyroxine (EUTHYROX) 137 MCG tablet Take 1 tablet (137 mcg total) by mouth daily before breakfast. Due for labs when pill bottle running low! 12/08/20   Emeterio Reeve, DO  Multiple Vitamin (MULTIVITAMIN) tablet Take 1 tablet by mouth daily.    [provider]  Probiotic Product (PROBIOTIC PO) Take 1 tablet by mouth daily.     [provider]      Allergies    Other, Reglan [metoclopramide], Cetacaine [butamben-tetracaine-benzocaine], Ciprofloxacin, Clarithromycin, and Moxifloxacin    Review of Systems   Review of Systems  Physical Exam Updated Vital Signs BP 109/69   Pulse 66   Temp 98.2 F (36.8 C) (Oral)   Resp 14   Ht 5' 3.5" (1.613 m)   Wt 80.7 kg   LMP 11/02/2011 (Exact Date)   SpO2 99%   BMI 31.04 kg/m  Physical Exam Constitutional:      General: She is not in acute distress. HENT:     Head: Normocephalic and atraumatic.  Eyes:     Conjunctiva/sclera: Conjunctivae normal.     Pupils: Pupils are equal, round, and reactive to light.  Cardiovascular:     Rate and Rhythm: Normal rate and regular rhythm.  Pulmonary:     Effort: Pulmonary effort  is normal. No respiratory distress.  Abdominal:     General: There is no distension.     Tenderness: There is abdominal tenderness in the right lower quadrant, suprapubic area and left lower quadrant.  Skin:    General: Skin is warm and dry.  Neurological:     General: No focal deficit present.     Mental Status: She is alert. Mental status is at baseline.  Psychiatric:        Mood and Affect: Mood normal.        Behavior: Behavior normal.     ED Results / Procedures / Treatments   Labs (all labs ordered are listed, but only abnormal results are displayed) Labs Reviewed   COMPREHENSIVE METABOLIC PANEL - Abnormal; Notable for the following components:      Result Value   Glucose, Bld 126 (*)    Creatinine, Ser 1.05 (*)    All other components within normal limits  I-STAT BETA HCG BLOOD, ED ([facility], [facility], [facility] ONLY) - Abnormal; Notable for the following components:   I-stat hCG, quantitative 8.2 (*)    All other components within normal limits  LIPASE, BLOOD  CBC  URINALYSIS, ROUTINE W REFLEX MICROSCOPIC  MAGNESIUM    EKG None  Radiology CT ABDOMEN PELVIS W CONTRAST  Result Date: 10/27/2021 CLINICAL DATA:  Nausea/vomiting diffuse lower abdominal pain, diarrhea, nausea, concern for colitis - hx of hysterectomy, appendectomy, hemicolectomy, cholecystectomy EXAM: CT ABDOMEN AND PELVIS WITH CONTRAST TECHNIQUE: Multidetector CT imaging of the abdomen and pelvis was performed using the standard protocol following bolus administration of intravenous contrast. RADIATION DOSE REDUCTION: This exam was performed according to the departmental dose-optimization program which includes automated exposure control, adjustment of the mA and/or kV according to patient size and/or use of iterative reconstruction technique. CONTRAST:  152m OMNIPAQUE IOHEXOL 300 MG/ML  SOLN COMPARISON:  CT 06/11/2019 FINDINGS: Lower chest: No acute abnormality. Hepatobiliary: No focal liver abnormality is seen. Status post cholecystectomy. No biliary dilatation. Pancreas: Unremarkable. No pancreatic ductal dilatation or surrounding inflammatory changes. Spleen: Normal in size without focal abnormality. Adrenals/Urinary Tract: Unremarkable adrenal glands. Kidneys enhance symmetrically. No focal renal lesion. Numerous punctate 3-4 mm bilateral nonobstructing renal calculi. No hydronephrosis. Bilateral ureters are unremarkable. No ureteral calculi are identified. Urinary bladder within normal limits. Stomach/Bowel: Stomach within normal limits. No dilated loops of bowel. Postsurgical changes of prior right  hemicolectomy without complicating features. No focal bowel wall thickening or inflammatory changes. Vascular/Lymphatic: No significant vascular findings are present. No enlarged abdominal or pelvic lymph nodes. Reproductive: Status post hysterectomy. No adnexal masses. Other: No free fluid. No abdominopelvic fluid collection. No pneumoperitoneum. No abdominal wall hernia. Musculoskeletal: No acute or significant osseous findings. IMPRESSION: 1. No acute abdominopelvic findings. 2. Numerous bilateral nonobstructing nephrolithiasis. 3. Postsurgical changes of prior right hemicolectomy without complicating features. Electronically Signed   By: NDavina PokeD.O.   On: 10/27/2021 10:44    Procedures Procedures    Medications Ordered in ED Medications  sodium chloride 0.9 % bolus 1,000 mL (0 mLs Intravenous Stopped 10/27/21 1124)  promethazine (PHENERGAN) 12.5 mg in sodium chloride 0.9 % 50 mL IVPB (0 mg Intravenous Stopped 10/27/21 1033)  morphine (PF) 2 MG/ML injection 2 mg (2 mg Intravenous Given 10/27/21 1014)  iohexol (OMNIPAQUE) 300 MG/ML solution 100 mL (100 mLs Intravenous Contrast Given 10/27/21 1001)    ED Course/ Medical Decision Making/ A&P Clinical Course as of 10/27/21 1635  Sat Oct 27, 2021  1256 Patient is feeling significantly better after  her medications and and ready to go home.  I reviewed her CT scan findings with her, numerous kidney stones but not passing any, no other emergent findings.  Blood work is reassuring.  Magnesium was not able to be added on but she does not want to wait for repeat magnesium check, I think this is reasonable with normal potassium to discharge her at this point. [MT]    Clinical Course User Index [MT] Rahkeem Senft, Carola Rhine, MD                           Medical Decision Making Amount and/or Complexity of Data Reviewed Labs: ordered. Radiology: ordered. ECG/medicine tests: ordered.  Risk Prescription drug management.   This patient presents to  the Emergency Department with complaint of abdominal pain. This involves an extensive number of treatment options, and is a complaint that carries with it a high risk of complications and morbidity.  The differential diagnosis includes, but is not limited to, gastritis vs biliary disease vs peptic ulcer vs pancreatitis versus constipation vs colitis vs UTI vs other  In discussion with the patient, I felt it was reasonable to obtain a CT scan of the abdomen at this time to evaluate for colitis or another inflammatory process given her broad, new lower abdominal pain.  I do not see evidence of urinary tract infection - per review of external records, she did have pyelonephritis in September last year.  Her near syncope or tunnel vision has been a recurring issue when she feels dehydrated, I will check an EKG but have a lower suspicion for ACS.  Potassium and other basic electrolytes are within normal limits per my review of her labs.  I ordered, reviewed, and interpreted labs, including BMP and CBC.  There were no immediate, life-threatening emergencies found in this labwork.  Patient's UA showed no signs of infection.  Lipase was within normal limits I ordered medication IV fluids, IV morphine, IV Phenergan for abdominal pain and/or nausea I ordered imaging studies which included CT abdomen pelvis I independently visualized and interpreted imaging which showed no emergent findings, and the monitor tracing which showed NSR  After the interventions stated above, I reevaluated the patient and found that they remained clinically stable.  Based on the patient's clinical exam, vital signs, risk factors, and ED testing, I felt that the patient's overall risk of life-threatening emergency such as bowel perforation, surgical emergency, or sepsis was quite low.  I suspect this clinical presentation is most consistent with nonspecific abdominal pain, likely vasovagal near syncope, but explained to the patient that  this evaluation was not a definitive diagnostic workup.  I discussed outpatient follow up with primary care provider, and provided specialist office number on the patient's discharge paper if a referral was deemed necessary.  I discussed return precautions with the patient. I felt the patient was clinically stable for discharge.         Final Clinical Impression(s) / ED Diagnoses Final diagnoses:  Near syncope  Abdominal pain, unspecified abdominal location  Nausea vomiting and diarrhea    Rx / DC Orders ED Discharge Orders     None         Wyvonnia Dusky, MD 10/27/21 1636

## 2021-10-27 NOTE — ED Notes (Signed)
[name] was resting, abd pain resolved. States need to go to bathroom. Pt unhooked from monitor.

## 2021-10-27 NOTE — ED Triage Notes (Signed)
Pt arrived POV from work c/o abdominal pain, N/V and diaphoresis that started this morning. Pt states she woke up not feeling good but attempted to come to work and that is when she got diaphoretic, nauseous and almost passed out. Pt is c/o generalized abdominal pain.

## 2021-10-27 NOTE — ED Notes (Signed)
Patient transported to CT 

## 2021-10-27 NOTE — ED Notes (Signed)
Ambulated to the bathroom and back unassisted. Steady gait.

## 2021-11-05 ENCOUNTER — Ambulatory Visit (INDEPENDENT_AMBULATORY_CARE_PROVIDER_SITE_OTHER): Payer: BC Managed Care – PPO

## 2021-11-05 ENCOUNTER — Ambulatory Visit
Admission: RE | Admit: 2021-11-05 | Discharge: 2021-11-05 | Disposition: A | Discharge status: Home / Self Care | Payer: BC Managed Care – PPO | Source: Ambulatory Visit | Attending: Internal Medicine | Admitting: Internal Medicine

## 2021-11-05 VITALS — BP 157/100 | HR 85 | Temp 98.0°F | Resp 18

## 2021-11-05 DIAGNOSIS — R509 Fever, unspecified: Secondary | ICD-10-CM | POA: Diagnosis not present

## 2021-11-05 DIAGNOSIS — J069 Acute upper respiratory infection, unspecified: Secondary | ICD-10-CM | POA: Diagnosis present

## 2021-11-05 DIAGNOSIS — R059 Cough, unspecified: Secondary | ICD-10-CM | POA: Diagnosis not present

## 2021-11-05 DIAGNOSIS — J029 Acute pharyngitis, unspecified: Secondary | ICD-10-CM | POA: Diagnosis present

## 2021-11-05 LAB — POCT RAPID STREP A (OFFICE): Rapid Strep A Screen: NEGATIVE

## 2021-11-05 MED ORDER — PREDNISONE 20 MG PO TABS: 40.0000 mg | ORAL_TABLET | Freq: Every day | ORAL | 0 refills | Status: AC
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
## 2021-11-05 NOTE — Discharge Instructions (Addendum)
Your x-ray was normal.  Suspect that you have bronchitis or asthma exacerbation in the setting of viral illness.  You have been prescribed prednisone to alleviate inflammation and symptoms.  Please follow-up if symptoms persist or worsen.

## 2021-11-05 NOTE — ED Triage Notes (Signed)
Pt reports she has had fevers for one week. Last week she was seen for diarrhea and dehydration. Then spiked a fever on Sunday. Pt reports reports head congestion, fatigue, and fevers/

## 2021-11-06 LAB — NOVEL CORONAVIRUS, NAA: SARS-CoV-2, NAA: NOT DETECTED

## 2021-11-08 LAB — CULTURE, GROUP A STREP (THRC)

## 2022-01-25 ENCOUNTER — Ambulatory Visit
Admission: EM | Admit: 2022-01-25 | Discharge: 2022-01-25 | Disposition: A | Discharge status: Home / Self Care | Payer: BC Managed Care – PPO | Attending: Physician Assistant | Admitting: Physician Assistant

## 2022-01-25 ENCOUNTER — Encounter: Payer: Self-pay | Admitting: Physician Assistant

## 2022-01-25 DIAGNOSIS — J4521 Mild intermittent asthma with (acute) exacerbation: Secondary | ICD-10-CM

## 2022-01-25 DIAGNOSIS — J209 Acute bronchitis, unspecified: Secondary | ICD-10-CM

## 2022-01-25 MED ORDER — PROMETHAZINE-DM 6.25-15 MG/5ML PO SYRP: 5.0000 mL | ORAL_SOLUTION | Freq: Three times a day (TID) | ORAL | 0 refills | Status: DC | PRN

## 2022-01-25 MED ORDER — METHYLPREDNISOLONE SODIUM SUCC 125 MG IJ SOLR
60.0000 mg | Freq: Once | INTRAMUSCULAR | Status: AC
  Administered 2022-01-25: 60 mg via INTRAMUSCULAR

## 2022-01-25 MED ORDER — ALBUTEROL SULFATE HFA 108 (90 BASE) MCG/ACT IN AERS: 1.0000 | INHALATION_SPRAY | Freq: Four times a day (QID) | RESPIRATORY_TRACT | 0 refills | Status: DC | PRN

## 2022-01-25 MED ORDER — IPRATROPIUM-ALBUTEROL 0.5-2.5 (3) MG/3ML IN SOLN
3.0000 mL | Freq: Once | RESPIRATORY_TRACT | Status: AC
  Administered 2022-01-25: 3 mL via RESPIRATORY_TRACT

## 2022-01-25 MED ORDER — PREDNISONE 10 MG (21) PO TBPK: ORAL_TABLET | ORAL | 0 refills | Status: DC

## 2022-01-25 NOTE — ED Triage Notes (Signed)
Pt presents to uc with co of fevers, cough, congestion, sore throat. Pt reports negative covid at home. Pt has been taking musinex, Flonase, afrin sudafed, tylenol and motrin for symptoms with minimal improvement.
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
## 2022-01-25 NOTE — Discharge Instructions (Addendum)
We gave the injection of steroids today.  Please start prednisone tomorrow (01/26/2022).  Do not take NSAIDs with this medication including aspirin, ibuprofen/Advil, naproxen/Aleve.  Use albuterol inhaler every 4-6 hours as needed.  You can use Promethazine DM for cough.  This make you sleepy so do not drive or drink alcohol with taking it.  Continue over-the-counter medications including Mucinex, Flonase, Tylenol.  Make sure you rest and drink plenty of fluid.  If your symptoms are not improving or if anything worsens please return for reevaluation.

## 2022-01-27 ENCOUNTER — Ambulatory Visit
Admission: EM | Admit: 2022-01-27 | Discharge: 2022-01-27 | Disposition: A | Discharge status: Home / Self Care | Payer: BC Managed Care – PPO | Attending: Emergency Medicine | Admitting: Emergency Medicine

## 2022-01-27 ENCOUNTER — Other Ambulatory Visit: Payer: Self-pay

## 2022-01-27 ENCOUNTER — Encounter: Payer: Self-pay | Admitting: Emergency Medicine

## 2022-01-27 DIAGNOSIS — J22 Unspecified acute lower respiratory infection: Secondary | ICD-10-CM | POA: Diagnosis not present

## 2022-01-27 DIAGNOSIS — H66002 Acute suppurative otitis media without spontaneous rupture of ear drum, left ear: Secondary | ICD-10-CM | POA: Diagnosis not present

## 2022-01-27 DIAGNOSIS — B9689 Other specified bacterial agents as the cause of diseases classified elsewhere: Secondary | ICD-10-CM

## 2022-01-27 DIAGNOSIS — J209 Acute bronchitis, unspecified: Secondary | ICD-10-CM | POA: Diagnosis not present

## 2022-01-27 MED ORDER — GUAIFENESIN 400 MG PO TABS: ORAL_TABLET | ORAL | 0 refills | Status: DC

## 2022-01-27 MED ORDER — FLUCONAZOLE 150 MG PO TABS: ORAL_TABLET | ORAL | 0 refills | Status: DC

## 2022-01-27 MED ORDER — IPRATROPIUM BROMIDE 0.06 % NA SOLN: 2.0000 | Freq: Three times a day (TID) | NASAL | 1 refills | Status: DC

## 2022-01-27 MED ORDER — CEFTRIAXONE SODIUM 1 G IJ SOLR
1.0000 g | Freq: Once | INTRAMUSCULAR | Status: AC
  Administered 2022-01-27: 1 g via INTRAMUSCULAR

## 2022-01-27 MED ORDER — AMOXICILLIN-POT CLAVULANATE 875-125 MG PO TABS: 1.0000 | ORAL_TABLET | Freq: Two times a day (BID) | ORAL | 0 refills | Status: AC
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
## 2022-01-27 NOTE — ED Triage Notes (Signed)
Pt here for left ear pain and worsening cough since being seen on Friday

## 2022-01-27 NOTE — Discharge Instructions (Addendum)
Please see below for list of medications that I recommend to treat and alleviate your current symptoms:    Augmentin (amoxicillin - clavulanic acid):  take 1 tablet twice daily for 10 days, you can take it with or without food.  This antibiotic can cause upset stomach, this will resolve once antibiotics are complete.  You are welcome to use a probiotic, eat yogurt, take Imodium while taking this medication.  Please avoid other systemic medications such as Maalox, Pepto-Bismol or milk of magnesia as they can interfere with your body's ability to absorb the antibiotics.  Advil, Motrin (ibuprofen): This is a good anti-inflammatory medication which not only addresses aches, pains but also significantly reduces soft tissue inflammation of the upper airways that causes sinus and nasal congestion as well as inflammation of the lower airways which makes you feel like your breathing is constricted or your cough feel tight.  I recommend that you take 400 mg every 8 hours as needed.      Atrovent (ipratropium): This is an excellent nasal decongestant spray that does not cause rebound congestion, please instill 2 sprays into each nare with each use.  Please use the spray up to 4 times daily as needed.  I have provided you with a prescription for this medication.      ProAir, Ventolin, Proventil (albuterol): This inhaled medication contains a short acting beta agonist bronchodilator.  This medication works on the smooth muscle that opens and constricts of your airways by relaxing the muscle.  The result of relaxation of the smooth muscle is increased air movement and improved work of breathing.  This is a short acting medication that can be used every 4-6 hours as needed for increased work of breathing, shortness of breath, wheezing and excessive coughing.  I have provided you with a prescription.    Robitussin, Mucinex (guaifenesin): This is an expectorant.  This helps break up chest congestion and loosen up thick  nasal drainage making phlegm and drainage more liquid and therefore easier to remove.  I recommend being 400 mg three times daily as needed.      Promethazine DM: Promethazine is both a nasal decongestant and an antinausea medication that makes most patients feel fairly sleepy.  The DM is dextromethorphan, a cough suppressant found in many over-the-counter cough medications.  Please take 5 mL before bedtime to minimize your cough which will help you sleep better.  I have sent a prescription for this medication to your pharmacy.   I have also provided you with a prescription for Diflucan, and antifungal medication used to treat vaginal yeast infections.  Please take the first Diflucan tablet on day 3 or 4 of your antibiotic therapy, and take the second Diflucan tablet 3 days later.  You do not need to pick up this prescription or take this medication unless you develop symptoms of vaginal yeast infection including thick, white vaginal discharge and/or vaginal itching.  This prescription has been provided as a Manufacturing engineer and for your convenience.  Please follow-up within the next 5-7 days either with your primary care provider or urgent care if your symptoms do not resolve.  If you do not have a primary care provider, we will assist you in finding one.        Thank you for visiting urgent care today.  We appreciate the opportunity to participate in your care.

## 2022-02-03 ENCOUNTER — Other Ambulatory Visit: Payer: Self-pay

## 2022-02-03 ENCOUNTER — Emergency Department (HOSPITAL_BASED_OUTPATIENT_CLINIC_OR_DEPARTMENT_OTHER): Payer: BC Managed Care – PPO

## 2022-02-03 ENCOUNTER — Emergency Department (HOSPITAL_BASED_OUTPATIENT_CLINIC_OR_DEPARTMENT_OTHER): Payer: BC Managed Care – PPO | Admitting: Radiology

## 2022-02-03 ENCOUNTER — Emergency Department (HOSPITAL_BASED_OUTPATIENT_CLINIC_OR_DEPARTMENT_OTHER)
Admission: EM | Admit: 2022-02-03 | Discharge: 2022-02-03 | Disposition: A | Discharge status: Home / Self Care | Payer: BC Managed Care – PPO | Attending: Emergency Medicine | Admitting: Emergency Medicine

## 2022-02-03 ENCOUNTER — Ambulatory Visit: Payer: BC Managed Care – PPO

## 2022-02-03 DIAGNOSIS — S0003XA Contusion of scalp, initial encounter: Secondary | ICD-10-CM | POA: Insufficient documentation

## 2022-02-03 DIAGNOSIS — S0990XA Unspecified injury of head, initial encounter: Secondary | ICD-10-CM | POA: Diagnosis present

## 2022-02-03 DIAGNOSIS — H5711 Ocular pain, right eye: Secondary | ICD-10-CM | POA: Insufficient documentation

## 2022-02-03 MED ORDER — METHOCARBAMOL 500 MG PO TABS: 500.0000 mg | ORAL_TABLET | Freq: Two times a day (BID) | ORAL | 0 refills | Status: DC

## 2022-02-03 MED ORDER — KETOROLAC TROMETHAMINE 15 MG/ML IJ SOLN
15.0000 mg | Freq: Once | INTRAMUSCULAR | Status: AC
  Administered 2022-02-03: 15 mg via INTRAMUSCULAR
  Filled 2022-02-03: qty 1

## 2022-02-03 NOTE — ED Notes (Signed)
Reviewed AVS/discharge instruction with patient. Time allotted for and all questions answered. Patient is agreeable for d/c and escorted to ed exit by staff.  
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
## 2022-02-03 NOTE — Discharge Instructions (Signed)
Your work-up in the ER today was reassuring for acute findings.  X-ray and CT imaging did not reveal any emergent concerns.  I have buddy taped your toes for relief of your toe pain and recommend you continue to do this if it helps.  Have also given you a prescription for Robaxin which is a muscle relaxer for you to take as prescribed as needed for management of your symptoms.  Please do not drive or operate heavy machinery while taking this medication as it can be sedating.  You may also take Tylenol and ibuprofen as needed for additional pain relief.  Follow-up with your primary care doctor in the next few days for continued evaluation and management of your symptoms  Return if development of any new or worsening symptoms.
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
## 2022-02-04 ENCOUNTER — Ambulatory Visit: Payer: BC Managed Care – PPO

## 2022-02-06 ENCOUNTER — Emergency Department (HOSPITAL_BASED_OUTPATIENT_CLINIC_OR_DEPARTMENT_OTHER): Payer: BC Managed Care – PPO

## 2022-02-06 ENCOUNTER — Encounter (HOSPITAL_BASED_OUTPATIENT_CLINIC_OR_DEPARTMENT_OTHER): Payer: Self-pay

## 2022-02-06 ENCOUNTER — Other Ambulatory Visit: Payer: Self-pay

## 2022-02-06 ENCOUNTER — Emergency Department (HOSPITAL_BASED_OUTPATIENT_CLINIC_OR_DEPARTMENT_OTHER)
Admission: EM | Admit: 2022-02-06 | Discharge: 2022-02-06 | Disposition: A | Discharge status: Home / Self Care | Payer: BC Managed Care – PPO | Attending: Emergency Medicine | Admitting: Emergency Medicine

## 2022-02-06 DIAGNOSIS — E039 Hypothyroidism, unspecified: Secondary | ICD-10-CM | POA: Insufficient documentation

## 2022-02-06 DIAGNOSIS — Z85038 Personal history of other malignant neoplasm of large intestine: Secondary | ICD-10-CM | POA: Insufficient documentation

## 2022-02-06 DIAGNOSIS — S060X0A Concussion without loss of consciousness, initial encounter: Secondary | ICD-10-CM | POA: Diagnosis not present

## 2022-02-06 DIAGNOSIS — M542 Cervicalgia: Secondary | ICD-10-CM | POA: Insufficient documentation

## 2022-02-06 DIAGNOSIS — Z87891 Personal history of nicotine dependence: Secondary | ICD-10-CM | POA: Insufficient documentation

## 2022-02-06 DIAGNOSIS — R519 Headache, unspecified: Secondary | ICD-10-CM | POA: Diagnosis present

## 2022-02-06 DIAGNOSIS — J45909 Unspecified asthma, uncomplicated: Secondary | ICD-10-CM | POA: Insufficient documentation

## 2022-02-06 MED ORDER — PROCHLORPERAZINE MALEATE 10 MG PO TABS: 10.0000 mg | ORAL_TABLET | Freq: Two times a day (BID) | ORAL | 0 refills | Status: DC | PRN

## 2022-02-06 MED ORDER — NAPROXEN 500 MG PO TABS: 500.0000 mg | ORAL_TABLET | Freq: Two times a day (BID) | ORAL | 0 refills | Status: DC

## 2022-02-06 MED ORDER — PROMETHAZINE HCL 25 MG/ML IJ SOLN
25.0000 mg | Freq: Four times a day (QID) | INTRAMUSCULAR | Status: DC | PRN
  Administered 2022-02-06: 25 mg via INTRAMUSCULAR
  Filled 2022-02-06: qty 1

## 2022-02-06 MED ORDER — KETOROLAC TROMETHAMINE 15 MG/ML IJ SOLN
30.0000 mg | Freq: Once | INTRAMUSCULAR | Status: AC
  Administered 2022-02-06: 30 mg via INTRAMUSCULAR
  Filled 2022-02-06: qty 2
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
## 2022-02-06 NOTE — Discharge Instructions (Addendum)
You were evaluated in the Emergency Department and after careful evaluation, we did not find any emergent condition requiring admission or further testing in the hospital.  Your exam/testing today is overall reassuring.  CT scans did not show any broken bones or bleeding or any other emergencies.  Symptoms likely due to a concussion.  Recommend mental and physical rest as we discussed.  Recommend use of the Naprosyn twice daily for pain.  Can also use the Compazine as needed for nausea or headache.  Please return to the Emergency Department if you experience any worsening of your condition.   Thank you for allowing [place] to be a part of your care.

## 2022-02-07 ENCOUNTER — Encounter: Payer: Self-pay | Admitting: Family Medicine

## 2022-02-07 ENCOUNTER — Ambulatory Visit (INDEPENDENT_AMBULATORY_CARE_PROVIDER_SITE_OTHER): Payer: BC Managed Care – PPO | Admitting: Family Medicine

## 2022-02-07 VITALS — BP 144/87 | HR 99 | Ht 63.0 in | Wt 185.0 lb

## 2022-02-07 DIAGNOSIS — M79672 Pain in left foot: Secondary | ICD-10-CM | POA: Diagnosis not present

## 2022-02-07 DIAGNOSIS — F0781 Postconcussional syndrome: Secondary | ICD-10-CM

## 2022-02-07 MED ORDER — AMITRIPTYLINE HCL 10 MG PO TABS: 10.0000 mg | ORAL_TABLET | Freq: Every day | ORAL | 0 refills | Status: DC

## 2022-02-07 MED ORDER — HYDROCODONE-ACETAMINOPHEN 5-325 MG PO TABS: 1.0000 | ORAL_TABLET | Freq: Four times a day (QID) | ORAL | 0 refills | Status: DC | PRN
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
## 2022-02-07 NOTE — Assessment & Plan Note (Addendum)
-   discussed supportive care for postconcussion syndrome.  Patient mentions a significant amount of insomnia and according to recent literature we will go ahead and start amitriptyline 10 mg at bedtime to see if this can help with headaches as well as help her relax and get some sleep at night.  If sleep is more related to not feeling safe in the home I advised her to go stay somewhere else or see if she can get someone to come to the house and look at the house prior to bedtime

## 2022-02-12 ENCOUNTER — Telehealth: Payer: Self-pay | Admitting: Family Medicine

## 2022-02-12 ENCOUNTER — Encounter: Payer: Self-pay | Admitting: Family Medicine

## 2022-02-12 NOTE — Telephone Encounter (Signed)
Patient requested to have work note for visit seen on 10/26 through 10/31 in order to return to work 11/01. [name]

## 2022-02-13 ENCOUNTER — Encounter: Payer: Self-pay | Admitting: Family Medicine

## 2022-02-15 ENCOUNTER — Ambulatory Visit: Payer: BC Managed Care – PPO | Admitting: Physician Assistant

## 2022-02-15 ENCOUNTER — Ambulatory Visit: Payer: BC Managed Care – PPO | Admitting: Sports Medicine

## 2022-02-27 ENCOUNTER — Other Ambulatory Visit: Payer: Self-pay | Admitting: Family Medicine

## 2022-02-27 DIAGNOSIS — F0781 Postconcussional syndrome: Secondary | ICD-10-CM

## 2022-02-27 MED ORDER — AMITRIPTYLINE HCL 10 MG PO TABS: 10.0000 mg | ORAL_TABLET | Freq: Every day | ORAL | 0 refills | Status: DC

## 2022-02-27 MED ORDER — METHOCARBAMOL 500 MG PO TABS: 500.0000 mg | ORAL_TABLET | Freq: Two times a day (BID) | ORAL | 0 refills | Status: DC

## 2022-02-27 NOTE — Progress Notes (Signed)
Pt called for refill on methocarbamol and amitriptyline. Have sent in refills until she can get into neurology and have a better plan for her post concussive syndrome headaches.

## 2022-03-05 ENCOUNTER — Encounter (HOSPITAL_COMMUNITY): Payer: Self-pay | Admitting: Emergency Medicine

## 2022-03-05 ENCOUNTER — Emergency Department (HOSPITAL_COMMUNITY)
Admission: EM | Admit: 2022-03-05 | Discharge: 2022-03-05 | Disposition: A | Discharge status: Home / Self Care | Payer: BC Managed Care – PPO | Attending: Emergency Medicine | Admitting: Emergency Medicine

## 2022-03-05 DIAGNOSIS — N611 Abscess of the breast and nipple: Secondary | ICD-10-CM | POA: Insufficient documentation

## 2022-03-05 DIAGNOSIS — L0291 Cutaneous abscess, unspecified: Secondary | ICD-10-CM

## 2022-03-05 DIAGNOSIS — N61 Mastitis without abscess: Secondary | ICD-10-CM | POA: Insufficient documentation

## 2022-03-05 LAB — CBC
HCT: 38.9 % (ref 36.0–46.0)
Hemoglobin: 12.8 g/dL (ref 12.0–15.0)
MCH: 30.8 pg (ref 26.0–34.0)
MCHC: 32.9 g/dL (ref 30.0–36.0)
MCV: 93.7 fL (ref 80.0–100.0)
Platelets: 250 10*3/uL (ref 150–400)
RBC: 4.15 MIL/uL (ref 3.87–5.11)
RDW: 13 % (ref 11.5–15.5)
WBC: 8.9 10*3/uL (ref 4.0–10.5)
nRBC: 0 % (ref 0.0–0.2)

## 2022-03-05 LAB — BASIC METABOLIC PANEL
Anion gap: 11 (ref 5–15)
BUN: 13 mg/dL (ref 6–20)
CO2: 26 mmol/L (ref 22–32)
Calcium: 9.4 mg/dL (ref 8.9–10.3)
Chloride: 102 mmol/L (ref 98–111)
Creatinine, Ser: 0.8 mg/dL (ref 0.44–1.00)
GFR, Estimated: >60 mL/min (ref >60)
Glucose, Bld: 111 mg/dL — ABNORMAL HIGH (ref 70–99)
Potassium: 3.6 mmol/L (ref 3.5–5.1)
Sodium: 139 mmol/L (ref 135–145)

## 2022-03-05 MED ORDER — CEPHALEXIN 500 MG PO CAPS: 500.0000 mg | ORAL_CAPSULE | Freq: Three times a day (TID) | ORAL | 0 refills | Status: AC

## 2022-03-05 MED ORDER — LIDOCAINE-EPINEPHRINE 1 %-1:100000 IJ SOLN
10.0000 mL | Freq: Once | INTRAMUSCULAR | Status: AC
  Administered 2022-03-05: 10 mL
  Filled 2022-03-05: qty 1

## 2022-03-05 MED ORDER — CEPHALEXIN 250 MG PO CAPS
500.0000 mg | ORAL_CAPSULE | Freq: Once | ORAL | Status: AC
  Administered 2022-03-05: 500 mg via ORAL
  Filled 2022-03-05: qty 2

## 2022-03-05 NOTE — ED Provider Triage Note (Signed)
Emergency Medicine Provider Triage Evaluation Note  [name] , a 51 y.o. female  was evaluated in triage.  Pt complains of abscess on the right breast for 3 days.  States she had abscess in the same location 2 years ago and got an I&D.  Denies seeing any blood or fluid drainage.  The abscess is painful to touch with skin redness around the abscess.  Reports fever of 101 last night and nausea.  No chest pain, shortness of breath, vomiting, bowel changes, urinary symptoms.  Review of Systems  Positive: As above Negative: As above  Physical Exam  BP (!) 142/93 (BP Location: Left Arm)   Pulse 80   Temp 98.1 F (36.7 C)   Resp 17   Ht '5\' 3"'$  (1.6 m)   LMP 11/02/2011 (Exact Date)   SpO2 100%   BMI 32.77 kg/m  Gen:   Awake, no distress   Resp:  Normal effort  MSK:   Moves extremities without difficulty  Other:  1 x 1 cm abscess on right breast.  Medical Decision Making  Medically screening exam initiated at 3:03 PM.  Appropriate orders placed.  [name] was informed that the remainder of the evaluation will be completed by another provider, this initial triage assessment does not replace that evaluation, and the importance of remaining in the ED until their evaluation is complete.     Rex Kras, [place] 03/05/22 2101

## 2022-03-05 NOTE — Discharge Instructions (Signed)
You presented today with an abscess to your right breast.  This was drained successfully.  Take the Keflex as prescribed and I suggest having your wound checked in 3 days either at an office, urgent care or the emergency department.  Return with any worsening symptoms, otherwise please follow-up with surgery as needed for a total excision.  It was a pleasure to meet you, ibuprofen and Tylenol may be used for your pain and we hope you feel better!

## 2022-03-05 NOTE — ED Triage Notes (Signed)
Pt has abscess to right breast. Hx of of I&D there about 2 years ago from spider bite.

## 2022-03-05 NOTE — ED Provider Notes (Signed)
[facility]GENCY DEPARTMENT Provider Note   CSN: [id number] Arrival date & time: 03/05/22  1420     History  Chief Complaint  Patient presents with   Abscess    [name] is a 51 y.o. female presenting today with an abscess to the right breast.  Reports that she has a history of this and it was incised and drained 2 years ago.  Says that last night she started to notice some pain and a bump in the area and this morning it had developed into a fall outside.  No nipple drainage or skin changes to the breast.   Abscess      Home Medications Prior to Admission medications   Medication Sig Start Date End Date Taking? Authorizing Provider  albuterol (VENTOLIN HFA) 108 (90 Base) MCG/ACT inhaler Inhale 1-2 puffs into the lungs every 6 (six) hours as needed for wheezing or shortness of breath. 01/25/22   Raspet, Derry Skill, PA-C  ALPRAZolam (XANAX) 0.5 MG tablet Take 0.5-1 tablets (0.25-0.5 mg total) by mouth daily as needed for anxiety (panic). 04/12/19   Emeterio Reeve, DO  amitriptyline (ELAVIL) 10 MG tablet Take 1 tablet (10 mg total) by mouth at bedtime. 02/27/22   Owens Loffler, DO  esomeprazole (NEXIUM) 20 MG capsule Take 20 mg by mouth daily at 12 noon.    [provider]  HYDROcodone-acetaminophen (NORCO/VICODIN) 5-325 MG tablet Take 1 tablet by mouth every 6 (six) hours as needed (pain). 02/07/22   Owens Loffler, DO  ipratropium (ATROVENT) 0.06 % nasal spray Place 2 sprays into both nostrils 3 (three) times daily. As needed for nasal congestion, runny nose 01/27/22   Lynden Oxford Scales, PA-C  levothyroxine (EUTHYROX) 137 MCG tablet Take 1 tablet (137 mcg total) by mouth daily before breakfast. Due for labs when pill bottle running low! 12/08/20   Emeterio Reeve, DO  methocarbamol (ROBAXIN) 500 MG tablet Take 1 tablet (500 mg total) by mouth 2 (two) times daily for 15 days. 02/27/22 03/14/22  Owens Loffler, DO  Multiple Vitamin  (MULTIVITAMIN) tablet Take 1 tablet by mouth daily.    [provider]  Probiotic Product (PROBIOTIC PO) Take 1 tablet by mouth daily.     [provider]      Allergies    Other, Reglan [metoclopramide], Cetacaine [butamben-tetracaine-benzocaine], Ciprofloxacin, Clarithromycin, and Moxifloxacin    Review of Systems   Review of Systems  Physical Exam Updated Vital Signs BP (!) 142/93 (BP Location: Left Arm)   Pulse 80   Temp 98.1 F (36.7 C)   Resp 17   Ht '5\' 3"'$  (1.6 m)   LMP 11/02/2011 (Exact Date)   SpO2 100%   BMI 32.77 kg/m  Physical Exam Vitals and nursing note reviewed.  Constitutional:      Appearance: Normal appearance.  HENT:     Head: Normocephalic and atraumatic.  Eyes:     General: No scleral icterus.    Conjunctiva/sclera: Conjunctivae normal.  Pulmonary:     Effort: Pulmonary effort is normal. No respiratory distress.  Chest:       Comments: 1.5x1.5 centimeter abscess to the right breast.  Surrounding cellulitis but no drainable Skin:    Findings: No rash.  Neurological:     Mental Status: She is alert.  Psychiatric:        Mood and Affect: Mood normal.     ED Results / Procedures / Treatments   Labs (all labs ordered are listed, but only  abnormal results are displayed) Labs Reviewed  BASIC METABOLIC PANEL - Abnormal; Notable for the following components:      Result Value   Glucose, Bld 111 (*)    All other components within normal limits  CBC    EKG None  Radiology No results found.  Procedures .Marland KitchenIncision and Drainage  Date/Time: 03/05/2022 6:18 PM  Performed by: Rhae Hammock, PA-C Authorized by: Rhae Hammock, PA-C   Consent:    Consent obtained:  Verbal   Consent given by:  Patient   Risks discussed:  Bleeding, incomplete drainage, infection and pain Universal protocol:    Patient identity confirmed:  Verbally with patient Location:    Type:  Abscess   Size:  1.5x1.5cm   Location:  Trunk    Trunk location:  R breast Pre-procedure details:    Skin preparation:  Povidone-iodine Sedation:    Sedation type:  None Anesthesia:    Anesthesia method:  Local infiltration   Local anesthetic:  Lidocaine 1% WITH epi Procedure type:    Complexity:  Simple Procedure details:    Incision types:  Single straight   Wound management:  Probed and deloculated and irrigated with saline   Drainage:  Purulent and serosanguinous   Drainage amount:  Moderate   Packing materials:  None Post-procedure details:    Procedure completion:  Tolerated well, no immediate complications    Medications Ordered in ED Medications  lidocaine-EPINEPHrine (XYLOCAINE W/EPI) 1 %-1:100000 (with pres) injection 10 mL (10 mLs Other Given by Other 03/05/22 1803)    ED Course/ Medical Decision Making/ A&P                           Medical Decision Making Risk Prescription drug management.   51 year old female presenting with an abscess to the right breast.  Has been present for around 24 hours.  Physical exam unremarkable, mild surrounding cellulitis but a fluctuant abscess appropriate for incision and drainage.  This procedure was performed without any complications.  Patient was started on antibiotics for the cellulitis and discharged home.  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   Final Clinical Impression(s) / ED Diagnoses Final diagnoses:  Abscess    Rx / DC Orders ED Discharge Orders     None      Results and diagnoses were explained to the patient. Return precautions discussed in full. Patient had no additional questions and expressed complete understanding.   This chart was dictated using voice recognition software.  Despite best efforts to proofread,  errors can occur which can change the documentation meaning.     Rhae Hammock, PA-C 03/05/22 [place], [place], DO 03/05/22 2349
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
## 2022-03-22 ENCOUNTER — Ambulatory Visit: Payer: Commercial Managed Care - PPO | Admitting: Psychiatry

## 2022-03-22 ENCOUNTER — Encounter: Payer: Self-pay | Admitting: Psychiatry

## 2022-04-23 ENCOUNTER — Other Ambulatory Visit: Payer: Self-pay

## 2022-04-23 ENCOUNTER — Ambulatory Visit
Admission: RE | Admit: 2022-04-23 | Discharge: 2022-04-23 | Disposition: A | Discharge status: Home / Self Care | Payer: BC Managed Care – PPO | Source: Ambulatory Visit | Attending: Internal Medicine | Admitting: Internal Medicine

## 2022-04-23 ENCOUNTER — Ambulatory Visit: Payer: BC Managed Care – PPO

## 2022-04-23 ENCOUNTER — Ambulatory Visit (INDEPENDENT_AMBULATORY_CARE_PROVIDER_SITE_OTHER): Payer: BC Managed Care – PPO

## 2022-04-23 VITALS — BP 136/84 | HR 87 | Temp 98.3°F | Resp 18

## 2022-04-23 DIAGNOSIS — M545 Low back pain, unspecified: Secondary | ICD-10-CM | POA: Diagnosis not present

## 2022-04-23 DIAGNOSIS — W19XXXA Unspecified fall, initial encounter: Secondary | ICD-10-CM

## 2022-04-23 DIAGNOSIS — M79602 Pain in left arm: Secondary | ICD-10-CM

## 2022-04-23 DIAGNOSIS — M533 Sacrococcygeal disorders, not elsewhere classified: Secondary | ICD-10-CM

## 2022-04-23 DIAGNOSIS — M7918 Myalgia, other site: Secondary | ICD-10-CM

## 2022-04-23 DIAGNOSIS — M79622 Pain in left upper arm: Secondary | ICD-10-CM

## 2022-04-23 MED ORDER — METHOCARBAMOL 500 MG PO TABS: 500.0000 mg | ORAL_TABLET | Freq: Every evening | ORAL | 0 refills | Status: DC | PRN

## 2022-04-23 MED ORDER — KETOROLAC TROMETHAMINE 60 MG/2ML IM SOLN
15.0000 mg | Freq: Once | INTRAMUSCULAR | Status: AC
  Administered 2022-04-23: 15 mg via INTRAMUSCULAR

## 2022-04-23 NOTE — Discharge Instructions (Signed)
X-rays were normal.  Suspect muscle strain/inflammation causing your discomfort.  You were given a Toradol shot today in urgent care.  Please avoid ibuprofen, Advil, Aleve for at least 24 hours following injection.  I have refilled your Robaxin.  Please use this sparingly and be careful with this medication as it will cause drowsiness and increase your chance of falls.  Follow-up with orthopedist and/or PCP for further evaluation and management.

## 2022-04-23 NOTE — ED Triage Notes (Signed)
Pt here for fall 2 days ago with pain in left hip down leg and bruising to left arm
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
## 2022-04-26 ENCOUNTER — Ambulatory Visit: Payer: BC Managed Care – PPO | Admitting: Sports Medicine

## 2022-04-26 DIAGNOSIS — Z87828 Personal history of other (healed) physical injury and trauma: Secondary | ICD-10-CM

## 2022-04-26 MED ORDER — METHOCARBAMOL 750 MG PO TABS: 750.0000 mg | ORAL_TABLET | Freq: Every day | ORAL | 1 refills | Status: DC | PRN

## 2022-04-26 NOTE — Progress Notes (Signed)
    Procedures performed today:    None.  Independent interpretation of notes and tests performed by another provider:   None.  Brief History, Exam, Impression, and Recommendations:    History of pelvic trauma Pleasant 52 year old female, she had a trip and fall recently a couple weeks ago, tripped over a cat. She impacted her left humerus and her left buttock. Was seen in the ED, x-rays of the lumbar spine, sacrum/coccyx, humerus were negative for fracture though she did have some expected arthritic changes. On exam she does have significant bruising humerus, I explained that this will likely improve on its own. She had severe tenderness left mid sacrum and left ischial tuberosity. I am concerned about an occult fracture so we will go and pull the trigger for a CT of her pelvis. I will refill her Robaxin for pain relief in the meantime.    ____________________________________________ Gwen Her. Dianah Field, M.D., ABFM., CAQSM., AME. Primary Care and Sports Medicine [facility]  Adjunct Professor of [employer] [facility] [employer] Medicine  Risk manager

## 2022-04-26 NOTE — Assessment & Plan Note (Signed)
Pleasant 52 year old female, she had a trip and fall recently a couple weeks ago, tripped over a cat. She impacted her left humerus and her left buttock. Was seen in the ED, x-rays of the lumbar spine, sacrum/coccyx, humerus were negative for fracture though she did have some expected arthritic changes. On exam she does have significant bruising humerus, I explained that this will likely improve on its own. She had severe tenderness left mid sacrum and left ischial tuberosity. I am concerned about an occult fracture so we will go and pull the trigger for a CT of her pelvis. I will refill her Robaxin for pain relief in the meantime.

## 2022-05-07 ENCOUNTER — Ambulatory Visit (INDEPENDENT_AMBULATORY_CARE_PROVIDER_SITE_OTHER): Payer: BC Managed Care – PPO

## 2022-05-07 DIAGNOSIS — M25552 Pain in left hip: Secondary | ICD-10-CM | POA: Diagnosis not present

## 2022-05-07 DIAGNOSIS — Z87828 Personal history of other (healed) physical injury and trauma: Secondary | ICD-10-CM

## 2022-05-07 DIAGNOSIS — M533 Sacrococcygeal disorders, not elsewhere classified: Secondary | ICD-10-CM | POA: Diagnosis not present

## 2022-05-24 ENCOUNTER — Ambulatory Visit: Payer: BC Managed Care – PPO | Admitting: Sports Medicine

## 2022-06-03 ENCOUNTER — Ambulatory Visit
Admission: RE | Admit: 2022-06-03 | Discharge: 2022-06-03 | Disposition: A | Discharge status: Home / Self Care | Payer: BC Managed Care – PPO | Source: Ambulatory Visit | Attending: Nurse Practitioner | Admitting: Nurse Practitioner

## 2022-06-03 VITALS — BP 121/86 | HR 86 | Temp 98.6°F | Resp 18

## 2022-06-03 DIAGNOSIS — Z1152 Encounter for screening for COVID-19: Secondary | ICD-10-CM | POA: Diagnosis not present

## 2022-06-03 DIAGNOSIS — J069 Acute upper respiratory infection, unspecified: Secondary | ICD-10-CM

## 2022-06-03 DIAGNOSIS — R051 Acute cough: Secondary | ICD-10-CM | POA: Diagnosis not present

## 2022-06-03 MED ORDER — ALBUTEROL SULFATE HFA 108 (90 BASE) MCG/ACT IN AERS: 1.0000 | INHALATION_SPRAY | Freq: Four times a day (QID) | RESPIRATORY_TRACT | 0 refills | Status: DC | PRN

## 2022-06-03 MED ORDER — BENZONATATE 200 MG PO CAPS: 200.0000 mg | ORAL_CAPSULE | Freq: Three times a day (TID) | ORAL | 0 refills | Status: DC | PRN
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
## 2022-06-03 NOTE — ED Triage Notes (Signed)
3-day h/o cough,nasal congestion and fatigue. Pt is taking DayQuil.

## 2022-06-03 NOTE — Discharge Instructions (Signed)
Refilled albuterol inhaler Tessalon as needed for cough Will contact you with results of your COVID test is positive Rest and fluids Continue Tylenol or ibuprofen as needed Follow-up with your PCP 2 to 3 days for recheck Please go to the ER if you have any worsening symptoms

## 2022-06-04 LAB — SARS CORONAVIRUS 2 (TAT 6-24 HRS): SARS Coronavirus 2: NEGATIVE

## 2022-06-12 ENCOUNTER — Ambulatory Visit: Payer: BC Managed Care – PPO | Admitting: Psychiatry

## 2022-06-12 ENCOUNTER — Encounter: Payer: Self-pay | Admitting: Psychiatry

## 2022-06-12 VITALS — BP 132/87 | HR 86 | Ht 63.0 in | Wt 185.2 lb

## 2022-06-12 DIAGNOSIS — R270 Ataxia, unspecified: Secondary | ICD-10-CM | POA: Diagnosis not present

## 2022-06-12 DIAGNOSIS — F0781 Postconcussional syndrome: Secondary | ICD-10-CM | POA: Diagnosis not present

## 2022-06-12 DIAGNOSIS — G43719 Chronic migraine without aura, intractable, without status migrainosus: Secondary | ICD-10-CM | POA: Diagnosis not present

## 2022-06-12 DIAGNOSIS — R200 Anesthesia of skin: Secondary | ICD-10-CM

## 2022-06-12 DIAGNOSIS — R202 Paresthesia of skin: Secondary | ICD-10-CM

## 2022-06-12 MED ORDER — UBRELVY 100 MG PO TABS: 100.0000 mg | ORAL_TABLET | ORAL | 6 refills | Status: DC | PRN

## 2022-06-12 MED ORDER — TOPIRAMATE 100 MG PO TABS: 100.0000 mg | ORAL_TABLET | Freq: Two times a day (BID) | ORAL | 6 refills | Status: DC

## 2022-06-12 MED ORDER — TOPIRAMATE 25 MG PO TABS: ORAL_TABLET | ORAL | 0 refills | Status: DC
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
## 2022-06-20 LAB — MULTIPLE MYELOMA PANEL, SERUM
Albumin SerPl Elph-Mcnc: 3.8 g/dL (ref 2.9–4.4)
Albumin/Glob SerPl: 1.2 (ref 0.7–1.7)
Alpha 1: 0.3 g/dL (ref 0.0–0.4)
Alpha2 Glob SerPl Elph-Mcnc: 0.8 g/dL (ref 0.4–1.0)
B-Globulin SerPl Elph-Mcnc: 1 g/dL (ref 0.7–1.3)
Gamma Glob SerPl Elph-Mcnc: 1.3 g/dL (ref 0.4–1.8)
Globulin, Total: 3.3 g/dL (ref 2.2–3.9)
IgA/Immunoglobulin A, Serum: 276 mg/dL (ref 87–352)
IgG (Immunoglobin G), Serum: 1147 mg/dL (ref 586–1602)
IgM (Immunoglobulin M), Srm: 177 mg/dL (ref 26–217)

## 2022-06-20 LAB — COMPREHENSIVE METABOLIC PANEL
ALT: 19 IU/L (ref 0–32)
AST: 18 IU/L (ref 0–40)
Albumin/Globulin Ratio: 1.6 (ref 1.2–2.2)
Albumin: 4.4 g/dL (ref 3.8–4.9)
Alkaline Phosphatase: 77 IU/L (ref 44–121)
BUN/Creatinine Ratio: 12 (ref 9–23)
BUN: 10 mg/dL (ref 6–24)
Bilirubin Total: 0.3 mg/dL (ref 0.0–1.2)
CO2: 28 mmol/L (ref 20–29)
Calcium: 9.6 mg/dL (ref 8.7–10.2)
Chloride: 102 mmol/L (ref 96–106)
Creatinine, Ser: 0.86 mg/dL (ref 0.57–1.00)
Globulin, Total: 2.7 g/dL (ref 1.5–4.5)
Glucose: 93 mg/dL (ref 70–99)
Potassium: 4.4 mmol/L (ref 3.5–5.2)
Sodium: 142 mmol/L (ref 134–144)
Total Protein: 7.1 g/dL (ref 6.0–8.5)
eGFR: 81 mL/min/{1.73_m2} (ref >59)

## 2022-06-20 LAB — CBC WITH DIFFERENTIAL/PLATELET
Basophils Absolute: 0.1 10*3/uL (ref 0.0–0.2)
Basos: 1 %
EOS (ABSOLUTE): 0.6 10*3/uL — ABNORMAL HIGH (ref 0.0–0.4)
Eos: 7 %
Hematocrit: 36.1 % (ref 34.0–46.6)
Hemoglobin: 12.1 g/dL (ref 11.1–15.9)
Immature Grans (Abs): 0 10*3/uL (ref 0.0–0.1)
Immature Granulocytes: 0 %
Lymphocytes Absolute: 3 10*3/uL (ref 0.7–3.1)
Lymphs: 38 %
MCH: 30.6 pg (ref 26.6–33.0)
MCHC: 33.5 g/dL (ref 31.5–35.7)
MCV: 91 fL (ref 79–97)
Monocytes Absolute: 0.5 10*3/uL (ref 0.1–0.9)
Monocytes: 7 %
Neutrophils Absolute: 3.8 10*3/uL (ref 1.4–7.0)
Neutrophils: 47 %
Platelets: 283 10*3/uL (ref 150–450)
RBC: 3.95 x10E6/uL (ref 3.77–5.28)
RDW: 12.8 % (ref 11.7–15.4)
WBC: 8 10*3/uL (ref 3.4–10.8)

## 2022-06-20 LAB — HEMOGLOBIN A1C
Est. average glucose Bld gHb Est-mCnc: 131 mg/dL
Hgb A1c MFr Bld: 6.2 % — ABNORMAL HIGH (ref 4.8–5.6)

## 2022-06-20 LAB — TSH: TSH: 4.88 u[IU]/mL — ABNORMAL HIGH (ref 0.450–4.500)

## 2022-06-20 LAB — VITAMIN B12: Vitamin B-12: 511 pg/mL (ref 232–1245)

## 2022-07-10 ENCOUNTER — Other Ambulatory Visit: Payer: BC Managed Care – PPO

## 2022-07-17 ENCOUNTER — Other Ambulatory Visit: Payer: BC Managed Care – PPO

## 2022-07-23 NOTE — Progress Notes (Unsigned)
     Established patient visit   Patient: [name]   DOB: 08-May-1970   52 y.o. Female  MRN: [id number] Visit Date: 07/24/2022  Today's healthcare provider: Charlton Amor, DO   No chief complaint on file.   SUBJECTIVE   No chief complaint on file.  HPI   Thyroid   DM  Review of Systems     No outpatient medications have been marked as taking for the 07/24/22 encounter (Appointment) with Charlton Amor, DO.    OBJECTIVE    LMP 11/02/2011 (Exact Date)   Physical Exam   {Show previous labs (optional):23736}    ASSESSMENT & PLAN    Problem List Items Addressed This Visit   None   No follow-ups on file.      No orders of the defined types were placed in this encounter.   No orders of the defined types were placed in this encounter.    Charlton Amor, DO  [facility] Medicine at [facility] [phone] (phone) [phone] (fax)  [facility]

## 2022-07-24 ENCOUNTER — Ambulatory Visit (INDEPENDENT_AMBULATORY_CARE_PROVIDER_SITE_OTHER): Payer: BC Managed Care – PPO | Admitting: Family Medicine

## 2022-07-24 DIAGNOSIS — Z91199 Patient's noncompliance with other medical treatment and regimen due to unspecified reason: Secondary | ICD-10-CM

## 2022-11-11 ENCOUNTER — Encounter: Payer: Self-pay | Admitting: Psychiatry

## 2022-12-03 ENCOUNTER — Encounter (HOSPITAL_BASED_OUTPATIENT_CLINIC_OR_DEPARTMENT_OTHER): Payer: Self-pay | Admitting: Emergency Medicine

## 2022-12-03 ENCOUNTER — Emergency Department (HOSPITAL_BASED_OUTPATIENT_CLINIC_OR_DEPARTMENT_OTHER): Payer: BC Managed Care – PPO

## 2022-12-03 ENCOUNTER — Emergency Department (HOSPITAL_BASED_OUTPATIENT_CLINIC_OR_DEPARTMENT_OTHER)
Admission: EM | Admit: 2022-12-03 | Discharge: 2022-12-03 | Disposition: A | Discharge status: Home / Self Care | Payer: BC Managed Care – PPO | Attending: Emergency Medicine | Admitting: Emergency Medicine

## 2022-12-03 ENCOUNTER — Other Ambulatory Visit: Payer: Self-pay

## 2022-12-03 DIAGNOSIS — E039 Hypothyroidism, unspecified: Secondary | ICD-10-CM | POA: Insufficient documentation

## 2022-12-03 DIAGNOSIS — R109 Unspecified abdominal pain: Secondary | ICD-10-CM | POA: Diagnosis not present

## 2022-12-03 LAB — URINALYSIS, ROUTINE W REFLEX MICROSCOPIC
Bacteria, UA: NONE SEEN
Bilirubin Urine: NEGATIVE
Glucose, UA: NEGATIVE mg/dL
Ketones, ur: NEGATIVE mg/dL
Leukocytes,Ua: NEGATIVE
Nitrite: NEGATIVE
Protein, ur: NEGATIVE mg/dL
Specific Gravity, Urine: 1.025 (ref 1.005–1.030)
pH: 5.5 (ref 5.0–8.0)

## 2022-12-03 MED ORDER — KETOROLAC TROMETHAMINE 30 MG/ML IJ SOLN
30.0000 mg | Freq: Once | INTRAMUSCULAR | Status: DC
  Filled 2022-12-03: qty 1

## 2022-12-03 MED ORDER — KETOROLAC TROMETHAMINE 60 MG/2ML IM SOLN
60.0000 mg | Freq: Once | INTRAMUSCULAR | Status: AC
  Administered 2022-12-03: 60 mg via INTRAMUSCULAR

## 2022-12-03 MED ORDER — HYDROCODONE-ACETAMINOPHEN 5-325 MG PO TABS
2.0000 | ORAL_TABLET | Freq: Once | ORAL | Status: AC
  Administered 2022-12-03: 2 via ORAL
  Filled 2022-12-03: qty 2

## 2022-12-03 MED ORDER — HYDROCODONE-ACETAMINOPHEN 5-325 MG PO TABS: 1.0000 | ORAL_TABLET | Freq: Four times a day (QID) | ORAL | 0 refills | Status: DC | PRN

## 2022-12-03 NOTE — ED Triage Notes (Signed)
Pt in with sharp, shooting R shoulder pain that began 3 days ago. Also reporting sharp R flank pain that began 24hrs ago, pain radiates to R groin. Hx of kidney stones, +nausea denies any cp or sob

## 2022-12-03 NOTE — ED Provider Notes (Signed)
[place] EMERGENCY DEPARTMENT AT [facility] Provider Note   CSN: [id number] Arrival date & time: 12/03/22  7829     History  Chief Complaint  Patient presents with   Shoulder Pain   Flank Pain    [name] is a 52 y.o. female.  Patient is a 52 year old female with past medical history of fibromyalgia, hypothyroidism, GERD, migraines, PTSD.  Patient presenting today with complaints of right flank pain.  This has been worsening over the past 3 days.  Symptoms began in the absence of any injury or trauma.  No fevers or chills.  No bowel or urinary complaints.  She does feel somewhat nauseated.  She reports symptoms are similar to what she experienced with prior kidney stones.  She has been taking ibuprofen and muscle relaxers at home with little relief.  The history is provided by the patient.       Home Medications Prior to Admission medications   Medication Sig Start Date End Date Taking? Authorizing Provider  albuterol (VENTOLIN HFA) 108 (90 Base) MCG/ACT inhaler Inhale 1-2 puffs into the lungs every 6 (six) hours as needed for wheezing or shortness of breath. 06/03/22   Radford Pax, NP  ALPRAZolam Prudy Feeler) 0.5 MG tablet Take 0.5-1 tablets (0.25-0.5 mg total) by mouth daily as needed for anxiety (panic). 04/12/19   Sunnie Nielsen, DO  benzonatate (TESSALON) 200 MG capsule Take 1 capsule (200 mg total) by mouth 3 (three) times daily as needed for cough. 06/03/22   Radford Pax, NP  esomeprazole (NEXIUM) 20 MG capsule Take 20 mg by mouth daily at 12 noon.    [provider]  ipratropium (ATROVENT) 0.06 % nasal spray Place 2 sprays into both nostrils 3 (three) times daily. As needed for nasal congestion, runny nose 01/27/22   Theadora Rama Scales, PA-C  methocarbamol (ROBAXIN) 750 MG tablet Take 1 tablet (750 mg total) by mouth daily as needed for muscle spasms. 04/26/22   Monica Becton, MD  Multiple Vitamin (MULTIVITAMIN) tablet Take 1  tablet by mouth daily.    [provider]  Probiotic Product (PROBIOTIC PO) Take 1 tablet by mouth daily.     [provider]  topiramate (TOPAMAX) 100 MG tablet Take 1 tablet (100 mg total) by mouth 2 (two) times daily. 08/11/22   Ocie Doyne, MD  topiramate (TOPAMAX) 25 MG tablet Take 25 mg at bedtime for 1 week. Then take 25 mg twice a day for 1 week. Then take 25 mg in AM and 50 mg (2 pills) in PM for 1 week. Then take 50 mg twice a day for 1 week. Then take 50 mg in AM and 75 mg (3 pills) in PM for 1 week. Then take 75 mg twice a day for 1 week. Then take 75 mg in AM and 100 mg (4 pills) in PM for 1 week. Then take 100 mg twice a day. 06/12/22   Ocie Doyne, MD  Ubrogepant (UBRELVY) 100 MG TABS Take 1 tablet (100 mg total) by mouth as needed (for migraine). May repeat a dose in 2 hours if needed. Max dose 2 pills in 24 hours 06/12/22   Ocie Doyne, MD      Allergies    Other, Reglan [metoclopramide], Cetacaine [butamben-tetracaine-benzocaine], Ciprofloxacin, Clarithromycin, and Moxifloxacin    Review of Systems   Review of Systems  All other systems reviewed and are negative.   Physical Exam Updated Vital Signs BP (!) 142/86   Pulse (!) 103  Temp 98.4 F (36.9 C) (Oral)   Resp 18   Wt 81.6 kg   LMP 11/02/2011 (Exact Date)   SpO2 100%   BMI 31.89 kg/m  Physical Exam Vitals and nursing note reviewed.  Constitutional:      General: She is not in acute distress.    Appearance: She is well-developed. She is not diaphoretic.  HENT:     Head: Normocephalic and atraumatic.  Cardiovascular:     Rate and Rhythm: Normal rate and regular rhythm.     Heart sounds: No murmur heard.    No friction rub. No gallop.  Pulmonary:     Effort: Pulmonary effort is normal. No respiratory distress.     Breath sounds: Normal breath sounds. No wheezing.  Abdominal:     General: Bowel sounds are normal. There is no distension.     Palpations: Abdomen is soft.      Tenderness: There is no abdominal tenderness. There is right CVA tenderness. There is no left CVA tenderness, guarding or rebound.  Musculoskeletal:        General: Normal range of motion.     Cervical back: Normal range of motion and neck supple.  Skin:    General: Skin is warm and dry.  Neurological:     General: No focal deficit present.     Mental Status: She is alert and oriented to person, place, and time.     ED Results / Procedures / Treatments   Labs (all labs ordered are listed, but only abnormal results are displayed) Labs Reviewed  URINALYSIS, ROUTINE W REFLEX MICROSCOPIC    EKG None  Radiology No results found.  Procedures Procedures    Medications Ordered in ED Medications  ketorolac (TORADOL) 30 MG/ML injection 30 mg (has no administration in time range)  HYDROcodone-acetaminophen (NORCO/VICODIN) 5-325 MG per tablet 2 tablet (has no administration in time range)    ED Course/ Medical Decision Making/ A&P  Patient is a 52 year old female presenting with right flank pain as described in the HPI.  She arrives with stable vital signs and is afebrile.  Physical examination reveals tenderness to the right CVA region.  Abdomen is basically benign otherwise.  Urinalysis obtained showing no evidence for infection.  CT renal obtained showing no acute intra-abdominal process.  Patient given IM Toradol along with Norco for pain and seems to be feeling better.  There are no red flags in this patient's history or exam that would suggest an emergent situation.  She has no bowel or bladder complaints and strength and reflexes are symmetrical.  Symptoms do seem musculoskeletal and I feel patient can safely be discharged with continued use of her home medications.  I will prescribe a small quantity of hydrocodone which she can take as needed for pain.  Final Clinical Impression(s) / ED Diagnoses Final diagnoses:  None    Rx / DC Orders ED Discharge Orders     None          Geoffery Lyons, MD 12/03/22 [phone]

## 2022-12-03 NOTE — Discharge Instructions (Addendum)
Take ibuprofen 600 mg 3 times daily for the next 5 days.  Begin taking hydrocodone as prescribed as needed for pain not relieved with ibuprofen.  Follow-up with your primary doctor if not improving in the next week.

## 2022-12-12 ENCOUNTER — Telehealth: Payer: Self-pay | Admitting: *Deleted

## 2022-12-12 NOTE — Telephone Encounter (Signed)
Transition Care Management Follow-up Telephone Call  Date of discharge and from where: [facility] 12/03/2022 How have you been since you were released from the hospital? Pain has diminished some and patient states she will follow up with pcp after she has time has been working  Any questions or concerns? No  Items Reviewed: Did the pt receive and understand the discharge instructions provided? Yes  Medications obtained and verified? Yes  Other? Yes  Any new allergies since your discharge? No  Dietary orders reviewed? No Do you have support at home? No      Follow up appointments reviewed:  PCP Hospital f/u appt confirmed? No  Are transportation arrangements needed? No  If their condition worsens, is the pt aware to call PCP or go to the Emergency Dept.? Yes Was the patient provided with contact information for the PCP's office or ED? Yes Was to pt encouraged to call back with questions or concerns? Yes

## 2023-01-03 ENCOUNTER — Ambulatory Visit
Admission: RE | Admit: 2023-01-03 | Discharge: 2023-01-03 | Disposition: A | Discharge status: Home / Self Care | Payer: BC Managed Care – PPO | Source: Ambulatory Visit | Attending: Physician Assistant | Admitting: Physician Assistant

## 2023-01-03 VITALS — BP 134/89 | HR 90 | Temp 98.4°F | Resp 16

## 2023-01-03 DIAGNOSIS — J4541 Moderate persistent asthma with (acute) exacerbation: Secondary | ICD-10-CM | POA: Diagnosis not present

## 2023-01-03 DIAGNOSIS — J689 Unspecified respiratory condition due to chemicals, gases, fumes and vapors: Secondary | ICD-10-CM

## 2023-01-03 MED ORDER — PREDNISONE 50 MG PO TABS: 60.0000 mg | ORAL_TABLET | Freq: Every day | ORAL | Status: DC

## 2023-01-03 MED ORDER — PREDNISONE 50 MG PO TABS: ORAL_TABLET | ORAL | 0 refills | Status: DC

## 2023-01-03 MED ORDER — IPRATROPIUM-ALBUTEROL 0.5-2.5 (3) MG/3ML IN SOLN
3.0000 mL | Freq: Once | RESPIRATORY_TRACT | Status: AC
  Administered 2023-01-03: 3 mL via RESPIRATORY_TRACT

## 2023-01-03 MED ORDER — ALBUTEROL SULFATE HFA 108 (90 BASE) MCG/ACT IN AERS: 2.0000 | INHALATION_SPRAY | Freq: Four times a day (QID) | RESPIRATORY_TRACT | 2 refills | Status: AC | PRN

## 2023-01-03 MED ORDER — ALBUTEROL SULFATE HFA 108 (90 BASE) MCG/ACT IN AERS
2.0000 | INHALATION_SPRAY | RESPIRATORY_TRACT | Status: DC
  Administered 2023-01-03: 2 via RESPIRATORY_TRACT

## 2023-01-03 MED ORDER — PREDNISOLONE 5 MG PO TABS
60.0000 mg | ORAL_TABLET | Freq: Once | ORAL | Status: AC
  Administered 2023-01-03: 60 mg via ORAL

## 2023-01-03 NOTE — Discharge Instructions (Addendum)
Return if any problems.

## 2023-01-03 NOTE — ED Triage Notes (Signed)
Was vacuuming today and some of the flea powder in the carpet somehow exploded into her face, which she inhaled. Hx of asthma, reports her normal inhaler ran out. Has since been coughing all afternoon.
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
## 2023-02-12 ENCOUNTER — Ambulatory Visit: Payer: BC Managed Care – PPO | Admitting: Psychiatry

## 2023-03-25 ENCOUNTER — Ambulatory Visit: Payer: Self-pay | Admitting: Family Medicine

## 2023-04-10 ENCOUNTER — Emergency Department (HOSPITAL_BASED_OUTPATIENT_CLINIC_OR_DEPARTMENT_OTHER)
Admission: EM | Admit: 2023-04-10 | Discharge: 2023-04-10 | Disposition: A | Discharge status: Home / Self Care | Payer: Self-pay | Attending: Emergency Medicine | Admitting: Emergency Medicine

## 2023-04-10 ENCOUNTER — Other Ambulatory Visit (HOSPITAL_BASED_OUTPATIENT_CLINIC_OR_DEPARTMENT_OTHER): Payer: Self-pay

## 2023-04-10 ENCOUNTER — Emergency Department (HOSPITAL_BASED_OUTPATIENT_CLINIC_OR_DEPARTMENT_OTHER): Payer: Self-pay

## 2023-04-10 DIAGNOSIS — R1030 Lower abdominal pain, unspecified: Secondary | ICD-10-CM | POA: Insufficient documentation

## 2023-04-10 DIAGNOSIS — N12 Tubulo-interstitial nephritis, not specified as acute or chronic: Secondary | ICD-10-CM

## 2023-04-10 DIAGNOSIS — R300 Dysuria: Secondary | ICD-10-CM | POA: Insufficient documentation

## 2023-04-10 LAB — URINALYSIS, ROUTINE W REFLEX MICROSCOPIC
Bacteria, UA: NONE SEEN
Bilirubin Urine: NEGATIVE
Glucose, UA: NEGATIVE mg/dL
Hgb urine dipstick: NEGATIVE
Nitrite: POSITIVE — AB
Protein, ur: NEGATIVE mg/dL
Specific Gravity, Urine: 1.016 (ref 1.005–1.030)
pH: 7 (ref 5.0–8.0)

## 2023-04-10 MED ORDER — KETOROLAC TROMETHAMINE 15 MG/ML IJ SOLN
30.0000 mg | Freq: Once | INTRAMUSCULAR | Status: AC
  Administered 2023-04-10: 30 mg via INTRAMUSCULAR
  Filled 2023-04-10: qty 2

## 2023-04-10 MED ORDER — MORPHINE SULFATE (PF) 4 MG/ML IV SOLN: 6.0000 mg | Freq: Once | INTRAVENOUS | Status: DC

## 2023-04-10 MED ORDER — ACETAMINOPHEN 325 MG PO TABS
650.0000 mg | ORAL_TABLET | Freq: Once | ORAL | Status: AC
  Administered 2023-04-10: 650 mg via ORAL
  Filled 2023-04-10: qty 2

## 2023-04-10 MED ORDER — CEPHALEXIN 500 MG PO CAPS
500.0000 mg | ORAL_CAPSULE | Freq: Four times a day (QID) | ORAL | 0 refills | Status: AC
  Filled 2023-04-10: qty 24, 6d supply, fill #0

## 2023-04-10 NOTE — ED Triage Notes (Signed)
Pt c/o bilateral lower back pain, reports "passing multiple kidney stones" x 9 days. Denies fever

## 2023-04-10 NOTE — ED Notes (Signed)
Discharge instructions, follow up care, and prescription reviewed and explained, pt verbalized understanding and had no further questions. Pt caox4, ambulatory, NAD on d/c.

## 2023-04-10 NOTE — ED Provider Notes (Signed)
[place] EMERGENCY DEPARTMENT AT [facility] Provider Note   CSN: [id number] Arrival date & time: 04/10/23  4098     History  Chief Complaint  Patient presents with   Back Pain    [name] is a 52 y.o. female.  52 year old female presents with 8 days of bilateral lower flank pain.  Patient states he has been passing multiple kidney stones and has history of similar symptoms.  Also notes some dysuria.  Denies any fever.  No nausea or vomiting.  No hematuria.       Home Medications Prior to Admission medications   Medication Sig Start Date End Date Taking? Authorizing Provider  albuterol (VENTOLIN HFA) 108 (90 Base) MCG/ACT inhaler Inhale 1-2 puffs into the lungs every 6 (six) hours as needed for wheezing or shortness of breath. 06/03/22   Radford Pax, NP  albuterol (VENTOLIN HFA) 108 (90 Base) MCG/ACT inhaler Inhale 2 puffs into the lungs every 6 (six) hours as needed for wheezing or shortness of breath. 01/03/23   Elson Areas, PA-C  ALPRAZolam Prudy Feeler) 0.5 MG tablet Take 0.5-1 tablets (0.25-0.5 mg total) by mouth daily as needed for anxiety (panic). 04/12/19   Sunnie Nielsen, DO  benzonatate (TESSALON) 200 MG capsule Take 1 capsule (200 mg total) by mouth 3 (three) times daily as needed for cough. 06/03/22   Radford Pax, NP  esomeprazole (NEXIUM) 20 MG capsule Take 20 mg by mouth daily at 12 noon.    [provider]  HYDROcodone-acetaminophen (NORCO) 5-325 MG tablet Take 1-2 tablets by mouth every 6 (six) hours as needed. 12/03/22   Geoffery Lyons, MD  ipratropium (ATROVENT) 0.06 % nasal spray Place 2 sprays into both nostrils 3 (three) times daily. As needed for nasal congestion, runny nose 01/27/22   Theadora Rama Scales, PA-C  methocarbamol (ROBAXIN) 750 MG tablet Take 1 tablet (750 mg total) by mouth daily as needed for muscle spasms. 04/26/22   Monica Becton, MD  Multiple Vitamin (MULTIVITAMIN) tablet Take 1 tablet by mouth daily.     [provider]  predniSONE (DELTASONE) 50 MG tablet One tablet a day for the next 5 days 01/03/23   Elson Areas, PA-C  Probiotic Product (PROBIOTIC PO) Take 1 tablet by mouth daily.     [provider]  topiramate (TOPAMAX) 100 MG tablet Take 1 tablet (100 mg total) by mouth 2 (two) times daily. 08/11/22   Ocie Doyne, MD  topiramate (TOPAMAX) 25 MG tablet Take 25 mg at bedtime for 1 week. Then take 25 mg twice a day for 1 week. Then take 25 mg in AM and 50 mg (2 pills) in PM for 1 week. Then take 50 mg twice a day for 1 week. Then take 50 mg in AM and 75 mg (3 pills) in PM for 1 week. Then take 75 mg twice a day for 1 week. Then take 75 mg in AM and 100 mg (4 pills) in PM for 1 week. Then take 100 mg twice a day. 06/12/22   Ocie Doyne, MD  Ubrogepant (UBRELVY) 100 MG TABS Take 1 tablet (100 mg total) by mouth as needed (for migraine). May repeat a dose in 2 hours if needed. Max dose 2 pills in 24 hours 06/12/22   Ocie Doyne, MD      Allergies    Other, Reglan [metoclopramide], Cetacaine [butamben-tetracaine-benzocaine], Aloe, Ciprofloxacin, Clarithromycin, and Moxifloxacin    Review of Systems   Review of Systems  All other  systems reviewed and are negative.   Physical Exam Updated Vital Signs BP (!) 150/103   Pulse 96   Temp 98.1 F (36.7 C)   Resp 18   LMP 11/02/2011 (Exact Date)   SpO2 100%  Physical Exam Vitals and nursing note reviewed.  Constitutional:      General: She is not in acute distress.    Appearance: Normal appearance. She is well-developed. She is not toxic-appearing.  HENT:     Head: Normocephalic and atraumatic.  Eyes:     General: Lids are normal.     Conjunctiva/sclera: Conjunctivae normal.     Pupils: Pupils are equal, round, and reactive to light.  Neck:     Thyroid: No thyroid mass.     Trachea: No tracheal deviation.  Cardiovascular:     Rate and Rhythm: Normal rate and regular rhythm.     Heart sounds: Normal  heart sounds. No murmur heard.    No gallop.  Pulmonary:     Effort: Pulmonary effort is normal. No respiratory distress.     Breath sounds: Normal breath sounds. No stridor. No decreased breath sounds, wheezing, rhonchi or rales.  Abdominal:     General: There is no distension.     Palpations: Abdomen is soft.     Tenderness: There is no abdominal tenderness. There is no rebound.  Musculoskeletal:        General: No tenderness. Normal range of motion.     Cervical back: Normal range of motion and neck supple.  Skin:    General: Skin is warm and dry.     Findings: No abrasion or rash.  Neurological:     Mental Status: She is alert and oriented to person, place, and time. Mental status is at baseline.     GCS: GCS eye subscore is 4. GCS verbal subscore is 5. GCS motor subscore is 6.     Cranial Nerves: No cranial nerve deficit.     Sensory: No sensory deficit.     Motor: Motor function is intact.  Psychiatric:        Attention and Perception: Attention normal.        Speech: Speech normal.        Behavior: Behavior normal.     ED Results / Procedures / Treatments   Labs (all labs ordered are listed, but only abnormal results are displayed) Labs Reviewed  URINALYSIS, ROUTINE W REFLEX MICROSCOPIC    EKG None  Radiology No results found.  Procedures Procedures    Medications Ordered in ED Medications  ketorolac (TORADOL) 15 MG/ML injection 30 mg (has no administration in time range)  morphine (PF) 4 MG/ML injection 6 mg (has no administration in time range)    ED Course/ Medical Decision Making/ A&P                                 Medical Decision Making Amount and/or Complexity of Data Reviewed Labs: ordered. Radiology: ordered.  Risk OTC drugs. Prescription drug management.   Urinalysis consistent with infection here.  CT renal stone showed no evidence of hydronephrosis.  Will place on antibiotics and discharged home        Final Clinical  Impression(s) / ED Diagnoses Final diagnoses:  None    Rx / DC Orders ED Discharge Orders     None         Lorre Nick, MD 04/10/23 1242

## 2023-04-10 NOTE — ED Notes (Signed)
Pt states she would prefer not to have narcotics, EDP Allen notified.

## 2023-04-10 NOTE — ED Notes (Signed)
Heat packs applied to R flank

## 2023-04-13 ENCOUNTER — Encounter (HOSPITAL_COMMUNITY): Payer: Self-pay

## 2023-04-13 ENCOUNTER — Other Ambulatory Visit: Payer: Self-pay

## 2023-04-13 ENCOUNTER — Emergency Department (HOSPITAL_COMMUNITY)
Admission: EM | Admit: 2023-04-13 | Discharge: 2023-04-13 | Disposition: A | Discharge status: Home / Self Care | Payer: Self-pay | Attending: Emergency Medicine | Admitting: Emergency Medicine

## 2023-04-13 ENCOUNTER — Emergency Department (HOSPITAL_COMMUNITY): Payer: Self-pay

## 2023-04-13 DIAGNOSIS — N390 Urinary tract infection, site not specified: Secondary | ICD-10-CM | POA: Insufficient documentation

## 2023-04-13 DIAGNOSIS — N201 Calculus of ureter: Secondary | ICD-10-CM | POA: Insufficient documentation

## 2023-04-13 LAB — CBC
HCT: 39 % (ref 36.0–46.0)
Hemoglobin: 13.4 g/dL (ref 12.0–15.0)
MCH: 32.4 pg (ref 26.0–34.0)
MCHC: 34.4 g/dL (ref 30.0–36.0)
MCV: 94.2 fL (ref 80.0–100.0)
Platelets: 242 10*3/uL (ref 150–400)
RBC: 4.14 MIL/uL (ref 3.87–5.11)
RDW: 12.8 % (ref 11.5–15.5)
WBC: 8.1 10*3/uL (ref 4.0–10.5)
nRBC: 0 % (ref 0.0–0.2)

## 2023-04-13 LAB — URINALYSIS, ROUTINE W REFLEX MICROSCOPIC
Bilirubin Urine: NEGATIVE
Glucose, UA: NEGATIVE mg/dL
Ketones, ur: NEGATIVE mg/dL
Leukocytes,Ua: NEGATIVE
Nitrite: POSITIVE — AB
Protein, ur: NEGATIVE mg/dL
Specific Gravity, Urine: 1.014 (ref 1.005–1.030)
pH: 7 (ref 5.0–8.0)

## 2023-04-13 LAB — BASIC METABOLIC PANEL
Anion gap: 9 (ref 5–15)
BUN: 12 mg/dL (ref 6–20)
CO2: 24 mmol/L (ref 22–32)
Calcium: 9 mg/dL (ref 8.9–10.3)
Chloride: 104 mmol/L (ref 98–111)
Creatinine, Ser: 0.88 mg/dL (ref 0.44–1.00)
GFR, Estimated: >60 mL/min (ref >60)
Glucose, Bld: 127 mg/dL — ABNORMAL HIGH (ref 70–99)
Potassium: 4.3 mmol/L (ref 3.5–5.1)
Sodium: 137 mmol/L (ref 135–145)

## 2023-04-13 MED ORDER — MORPHINE SULFATE (PF) 4 MG/ML IV SOLN
4.0000 mg | Freq: Once | INTRAVENOUS | Status: AC
  Administered 2023-04-13: 4 mg via INTRAVENOUS
  Filled 2023-04-13: qty 1

## 2023-04-13 MED ORDER — TAMSULOSIN HCL 0.4 MG PO CAPS
0.4000 mg | ORAL_CAPSULE | Freq: Once | ORAL | Status: AC
  Administered 2023-04-13: 0.4 mg via ORAL
  Filled 2023-04-13: qty 1

## 2023-04-13 MED ORDER — FENTANYL CITRATE PF 50 MCG/ML IJ SOSY
50.0000 ug | PREFILLED_SYRINGE | INTRAMUSCULAR | Status: DC | PRN
  Administered 2023-04-13: 50 ug via INTRAVENOUS
  Filled 2023-04-13: qty 1

## 2023-04-13 MED ORDER — KETOROLAC TROMETHAMINE 15 MG/ML IJ SOLN
15.0000 mg | Freq: Once | INTRAMUSCULAR | Status: AC
  Administered 2023-04-13: 15 mg via INTRAVENOUS
  Filled 2023-04-13: qty 1

## 2023-04-13 MED ORDER — ONDANSETRON HCL 4 MG/2ML IJ SOLN
4.0000 mg | Freq: Once | INTRAMUSCULAR | Status: AC
Start: 2023-04-13 — End: 2023-04-13
  Administered 2023-04-13: 4 mg via INTRAVENOUS
  Filled 2023-04-13: qty 2

## 2023-04-13 MED ORDER — SODIUM CHLORIDE 0.9 % IV SOLN
1.0000 g | Freq: Once | INTRAVENOUS | Status: AC
  Administered 2023-04-13: 1 g via INTRAVENOUS
  Filled 2023-04-13: qty 10

## 2023-04-13 MED ORDER — IBUPROFEN 600 MG PO TABS: 600.0000 mg | ORAL_TABLET | Freq: Four times a day (QID) | ORAL | 0 refills | Status: DC | PRN

## 2023-04-13 MED ORDER — TAMSULOSIN HCL 0.4 MG PO CAPS: 0.4000 mg | ORAL_CAPSULE | Freq: Every day | ORAL | 0 refills | Status: DC

## 2023-04-13 NOTE — Discharge Instructions (Signed)
Please continue to take your antibiotic for the full duration.  Take Flomax to help makes it easier to pass your kidney stone.  Use a urine strainer provided to help capture the stone and bring it to urologist for further assessment.  Take ibuprofen as needed for pain.  Return if you have any concern.

## 2023-04-13 NOTE — ED Triage Notes (Signed)
Pt BIB [facility] from home. Presents with multiple kidney stones that have become dislodged and has been passing them since Thursday where she was seen at [facility] ED. Pt is having uncontrolled pain. EMS admin 200 mcg of Fentanyl.

## 2023-04-13 NOTE — ED Provider Notes (Signed)
[place] EMERGENCY DEPARTMENT AT [facility] Provider Note   CSN: [id number] Arrival date & time: 04/13/23  1347     History  Chief Complaint  Patient presents with   Flank Pain    [name] is a 52 y.o. female.  The history is provided by the patient and medical records. No language interpreter was used.  Flank Pain     52 year old female significant history of fibromyalgia's, endometriosis, kidney stones, brought here via EMS from home with complaints of flank pain.  Patient report for the past 2 weeks she has had flank pain as well as urinary discomfort.  She mention she felt like she is passing multiple kidney stones.  She was seen 3 days ago in the ER for her complaint.  She mention that she was diagnosed with a kidney infection and was subsequently discharged home with Keflex.  Symptoms seem to be improving for about a day or 2 but throughout the day today she endorsed severe pain to her flank and her lower abdomen as well as increased urinary discomfort.  She felt like her kidney stone may have been lodged in her ureter.  She endorses severe nausea and vomiting but denies fever or chills no chest pain or shortness of breath no vaginal bleeding or vaginal discharge.  Home Medications Prior to Admission medications   Medication Sig Start Date End Date Taking? Authorizing Provider  albuterol (VENTOLIN HFA) 108 (90 Base) MCG/ACT inhaler Inhale 1-2 puffs into the lungs every 6 (six) hours as needed for wheezing or shortness of breath. 06/03/22   Radford Pax, NP  albuterol (VENTOLIN HFA) 108 (90 Base) MCG/ACT inhaler Inhale 2 puffs into the lungs every 6 (six) hours as needed for wheezing or shortness of breath. 01/03/23   Elson Areas, PA-C  ALPRAZolam Prudy Feeler) 0.5 MG tablet Take 0.5-1 tablets (0.25-0.5 mg total) by mouth daily as needed for anxiety (panic). 04/12/19   Sunnie Nielsen, DO  benzonatate (TESSALON) 200 MG capsule Take 1 capsule (200 mg total)  by mouth 3 (three) times daily as needed for cough. 06/03/22   Radford Pax, NP  cephALEXin (KEFLEX) 500 MG capsule Take 1 capsule (500 mg total) by mouth 4 (four) times daily for 6 days. 04/10/23 04/18/23  Lorre Nick, MD  esomeprazole (NEXIUM) 20 MG capsule Take 20 mg by mouth daily at 12 noon.    [provider]  HYDROcodone-acetaminophen (NORCO) 5-325 MG tablet Take 1-2 tablets by mouth every 6 (six) hours as needed. 12/03/22   Geoffery Lyons, MD  ipratropium (ATROVENT) 0.06 % nasal spray Place 2 sprays into both nostrils 3 (three) times daily. As needed for nasal congestion, runny nose 01/27/22   Theadora Rama Scales, PA-C  methocarbamol (ROBAXIN) 750 MG tablet Take 1 tablet (750 mg total) by mouth daily as needed for muscle spasms. 04/26/22   Monica Becton, MD  Multiple Vitamin (MULTIVITAMIN) tablet Take 1 tablet by mouth daily.    [provider]  predniSONE (DELTASONE) 50 MG tablet One tablet a day for the next 5 days 01/03/23   Elson Areas, PA-C  Probiotic Product (PROBIOTIC PO) Take 1 tablet by mouth daily.     [provider]  topiramate (TOPAMAX) 100 MG tablet Take 1 tablet (100 mg total) by mouth 2 (two) times daily. 08/11/22   Ocie Doyne, MD  topiramate (TOPAMAX) 25 MG tablet Take 25 mg at bedtime for 1 week. Then take 25 mg twice a day for 1  week. Then take 25 mg in AM and 50 mg (2 pills) in PM for 1 week. Then take 50 mg twice a day for 1 week. Then take 50 mg in AM and 75 mg (3 pills) in PM for 1 week. Then take 75 mg twice a day for 1 week. Then take 75 mg in AM and 100 mg (4 pills) in PM for 1 week. Then take 100 mg twice a day. 06/12/22   Ocie Doyne, MD  Ubrogepant (UBRELVY) 100 MG TABS Take 1 tablet (100 mg total) by mouth as needed (for migraine). May repeat a dose in 2 hours if needed. Max dose 2 pills in 24 hours 06/12/22   Ocie Doyne, MD      Allergies    Other, Reglan [metoclopramide], Cetacaine  [butamben-tetracaine-benzocaine], Aloe, Ciprofloxacin, Clarithromycin, and Moxifloxacin    Review of Systems   Review of Systems  Genitourinary:  Positive for flank pain.  All other systems reviewed and are negative.   Physical Exam Updated Vital Signs BP (!) 145/97 (BP Location: Right Arm)   Pulse 81   Temp 98 F (36.7 C) (Oral)   Resp (!) 22   Ht 5\' 3"  (1.6 m)   Wt 81.6 kg   LMP 11/02/2011 (Exact Date)   SpO2 97%   BMI 31.87 kg/m  Physical Exam Vitals and nursing note reviewed.  Constitutional:      General: She is not in acute distress.    Appearance: She is well-developed.  HENT:     Head: Atraumatic.  Eyes:     Conjunctiva/sclera: Conjunctivae normal.  Pulmonary:     Effort: Pulmonary effort is normal.  Abdominal:     Palpations: Abdomen is soft.     Tenderness: There is abdominal tenderness (Suprapubic tenderness no guarding no rebound tenderness). There is no right CVA tenderness or left CVA tenderness.  Musculoskeletal:     Cervical back: Neck supple.  Skin:    Findings: No rash.  Neurological:     Mental Status: She is alert.  Psychiatric:        Mood and Affect: Mood normal.     ED Results / Procedures / Treatments   Labs (all labs ordered are listed, but only abnormal results are displayed) Labs Reviewed  BASIC METABOLIC PANEL - Abnormal; Notable for the following components:      Result Value   Glucose, Bld 127 (*)    All other components within normal limits  URINALYSIS, ROUTINE W REFLEX MICROSCOPIC - Abnormal; Notable for the following components:   Color, Urine AMBER (*)    Hgb urine dipstick SMALL (*)    Nitrite POSITIVE (*)    Bacteria, UA RARE (*)    All other components within normal limits  CBC    EKG None  Radiology US Renal Result Date: 04/13/2023 CLINICAL DATA:  Flank pain, history of nephrolithiasis EXAM: RENAL / URINARY TRACT ULTRASOUND COMPLETE COMPARISON:  04/10/2023 FINDINGS: Right Kidney: Renal measurements: 12.2 x 3.9  x 4.2 cm = volume: 104.1 mL. Normal renal cortical echotexture. 5 mm calculus is seen within the upper pole right kidney. The additional right-sided calculi seen on recent CT are not visualized by ultrasound. No hydronephrosis or renal mass. Left Kidney: Renal measurements: 10.1 x 5.1 by 4.7 cm = volume: 128.9 mL. Normal renal cortical echotexture. There is fullness of the left renal pelvis without frank hydronephrosis. The numerous left renal calculi seen on recent CT are not apparent by ultrasound. No renal mass. Bladder: Bladder is  decompressed, limiting its evaluation. Other: None. IMPRESSION: 1. 5 mm calculus upper pole right kidney. The numerous other bilateral renal calculi seen on recent CT are not apparent by ultrasound. 2. Fullness of the left renal pelvis without frank hydronephrosis. This is new since the preceding CT. Electronically Signed   By: Sharlet Salina M.D.   On: 04/13/2023 16:36    Procedures .Critical Care  Performed by: Fayrene Helper, PA-C Authorized by: Fayrene Helper, PA-C   Critical care provider statement:    Critical care time (minutes):  30   Critical care was time spent personally by me on the following activities:  Development of treatment plan with patient or surrogate, discussions with consultants, evaluation of patient's response to treatment, examination of patient, ordering and review of laboratory studies, ordering and review of radiographic studies, ordering and performing treatments and interventions, pulse oximetry, re-evaluation of patient's condition and review of old charts     Medications Ordered in ED Medications  fentaNYL (SUBLIMAZE) injection 50 mcg (50 mcg Intravenous Given 04/13/23 1430)  ondansetron (ZOFRAN) injection 4 mg (4 mg Intravenous Given 04/13/23 1430)  ketorolac (TORADOL) 15 MG/ML injection 15 mg (15 mg Intravenous Given 04/13/23 1619)  morphine (PF) 4 MG/ML injection 4 mg (4 mg Intravenous Given 04/13/23 1739)  cefTRIAXone (ROCEPHIN) 1 g in  sodium chloride 0.9 % 100 mL IVPB (1 g Intravenous New Bag/Given 04/13/23 1923)  tamsulosin (FLOMAX) capsule 0.4 mg (0.4 mg Oral Given 04/13/23 1930)  morphine (PF) 4 MG/ML injection 4 mg (4 mg Intravenous Given 04/13/23 1922)    ED Course/ Medical Decision Making/ A&P                                 Medical Decision Making Amount and/or Complexity of Data Reviewed Labs: ordered. Radiology: ordered.  Risk Prescription drug management.   BP (!) 145/97 (BP Location: Right Arm)   Pulse 81   Temp 98 F (36.7 C) (Oral)   Resp (!) 22   Ht 5\' 3"  (1.6 m)   Wt 81.6 kg   LMP 11/02/2011 (Exact Date)   SpO2 97%   BMI 31.87 kg/m   30:27 PM  52 year old female significant history of fibromyalgia's, endometriosis, kidney stones, brought here via EMS from home with complaints of flank pain.  Patient report for the past 2 weeks she has had flank pain as well as urinary discomfort.  She mention she felt like she is passing multiple kidney stones.  She was seen 3 days ago in the ER for her complaint.  She mention that she was diagnosed with a kidney infection and was subsequently discharged home with Keflex.  Symptoms seem to be improving for about a day or 2 but throughout the day today she endorsed severe pain to her flank and her lower abdomen as well as increased urinary discomfort.  She felt like her stoma have been lodged in her ureter.  She endorses severe nausea and vomiting but denies fever or chills no chest pain or shortness of breath no vaginal bleeding or vaginal discharge.  Exam notable for suprapubic tenderness on palpation no guarding no rebound tenderness.  No CVA tenderness.  Patient however appears uncomfortable.  -Labs ordered, independently viewed and interpreted by me.  Labs remarkable for UA with nitrite positive, concerning for UTI.  Normal WBC, normal renal function -The patient was maintained on a cardiac monitor.  I personally viewed and interpreted the cardiac monitored  which showed an underlying rhythm of: NSR -Imaging independently viewed and interpreted by me and I agree with radiologist's interpretation.  Result remarkable for renal US showing non obstructive kidney stones, and fullness of the left renal pelvis which is new -This patient presents to the ED for concern of flank pain, this involves an extensive number of treatment options, and is a complaint that carries with it a high risk of complications and morbidity.  The differential diagnosis includes pyelonephritis, obstructive kidney stone, dissection, diverticulitis, colitis, UTI -Co morbidities that complicate the patient evaluation includes hx of kidney stones -Treatment includes fentanyl, morphine, zofran,  -Reevaluation of the patient after these medicines showed that the patient improved -PCP office notes or outside notes reviewed -Escalation to admission/observation considered: patients feels much better, is comfortable with discharge, and will follow up with PCP -Prescription medication considered, patient comfortable with ibuprofen, flomax -Social Determinant of Health considered  6:35 PM Symptom has been waxing waning.  Patient required multiple dose of pain medication.  Plan to continue with current treatment but patient may need admission if no improvement of his symptoms.  8:11 PM On reassessment, patient appears much more comfortable.  She felt comfortable going home.  Will prescribe Flomax, ibuprofen, and she will continue with her Keflex.  Recommend outpatient follow-up with urology.  Gave patient strict return precaution.  We did consider hospital admission as this could be an infected kidney stone but patient's preference is to go home.  Work note provided.        Final Clinical Impression(s) / ED Diagnoses Final diagnoses:  Lower urinary tract infectious disease  Ureterolithiasis    Rx / DC Orders ED Discharge Orders          Ordered    ibuprofen (ADVIL) 600 MG tablet   Every 6 hours PRN        04/13/23 2013    tamsulosin (FLOMAX) 0.4 MG CAPS capsule  Daily        04/13/23 2013              Fayrene Helper, PA-C 04/13/23 2015    Theresia Lo [place] K, DO 04/13/23 2337

## 2023-05-23 ENCOUNTER — Ambulatory Visit
Admission: RE | Admit: 2023-05-23 | Discharge: 2023-05-23 | Disposition: A | Discharge status: Home / Self Care | Payer: Self-pay | Source: Ambulatory Visit

## 2023-05-23 ENCOUNTER — Ambulatory Visit
Admission: RE | Admit: 2023-05-23 | Discharge: 2023-05-23 | Disposition: A | Discharge status: Home / Self Care | Payer: Self-pay | Source: Ambulatory Visit | Attending: Emergency Medicine | Admitting: Emergency Medicine

## 2023-05-23 DIAGNOSIS — R0989 Other specified symptoms and signs involving the circulatory and respiratory systems: Secondary | ICD-10-CM

## 2023-05-23 DIAGNOSIS — R0602 Shortness of breath: Secondary | ICD-10-CM

## 2023-05-23 DIAGNOSIS — R52 Pain, unspecified: Secondary | ICD-10-CM

## 2023-05-23 DIAGNOSIS — R051 Acute cough: Secondary | ICD-10-CM

## 2023-05-23 DIAGNOSIS — R509 Fever, unspecified: Secondary | ICD-10-CM

## 2023-05-23 LAB — POCT INFLUENZA A/B
Influenza A, POC: NEGATIVE
Influenza B, POC: NEGATIVE

## 2023-05-23 LAB — POCT RAPID STREP A (OFFICE): Rapid Strep A Screen: NEGATIVE

## 2023-05-23 MED ORDER — KETOROLAC TROMETHAMINE 30 MG/ML IJ SOLN
30.0000 mg | Freq: Once | INTRAMUSCULAR | Status: AC
  Administered 2023-05-23: 30 mg via INTRAMUSCULAR

## 2023-05-23 MED ORDER — METHYLPREDNISOLONE SODIUM SUCC 125 MG IJ SOLR: 80.0000 mg | Freq: Once | INTRAMUSCULAR | Status: DC

## 2023-05-23 MED ORDER — METHYLPREDNISOLONE ACETATE 80 MG/ML IJ SUSP
80.0000 mg | Freq: Once | INTRAMUSCULAR | Status: AC
  Administered 2023-05-23: 80 mg via INTRAMUSCULAR

## 2023-05-23 MED ORDER — PREDNISONE 10 MG (21) PO TBPK: ORAL_TABLET | Freq: Every day | ORAL | 0 refills | Status: DC
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
## 2023-05-23 NOTE — Discharge Instructions (Signed)
 Take Tylenol  Motrin  as needed for fever or bodyaches Take over-the-counter cough and cold medicine as needed Use albuterol  inhaler as needed for shortness of breath

## 2023-05-23 NOTE — ED Triage Notes (Signed)
 Pt presents with URI symptoms. Headache started Sunday after hitting her head and the remaining symptoms started last night.

## 2023-05-25 LAB — CULTURE, GROUP A STREP (THRC)

## 2023-06-19 ENCOUNTER — Emergency Department (HOSPITAL_BASED_OUTPATIENT_CLINIC_OR_DEPARTMENT_OTHER): Payer: Self-pay

## 2023-06-19 ENCOUNTER — Emergency Department (HOSPITAL_BASED_OUTPATIENT_CLINIC_OR_DEPARTMENT_OTHER)
Admission: EM | Admit: 2023-06-19 | Discharge: 2023-06-19 | Disposition: A | Discharge status: Home / Self Care | Payer: Worker's Compensation | Attending: Emergency Medicine | Admitting: Emergency Medicine

## 2023-06-19 ENCOUNTER — Other Ambulatory Visit: Payer: Self-pay

## 2023-06-19 DIAGNOSIS — S161XXA Strain of muscle, fascia and tendon at neck level, initial encounter: Secondary | ICD-10-CM | POA: Insufficient documentation

## 2023-06-19 DIAGNOSIS — S0990XA Unspecified injury of head, initial encounter: Secondary | ICD-10-CM | POA: Diagnosis present

## 2023-06-19 DIAGNOSIS — S060XAA Concussion with loss of consciousness status unknown, initial encounter: Secondary | ICD-10-CM | POA: Diagnosis not present

## 2023-06-19 DIAGNOSIS — J45909 Unspecified asthma, uncomplicated: Secondary | ICD-10-CM | POA: Diagnosis not present

## 2023-06-19 DIAGNOSIS — Y92410 Unspecified street and highway as the place of occurrence of the external cause: Secondary | ICD-10-CM | POA: Insufficient documentation

## 2023-06-19 DIAGNOSIS — Z7951 Long term (current) use of inhaled steroids: Secondary | ICD-10-CM | POA: Diagnosis not present

## 2023-06-19 DIAGNOSIS — Z85038 Personal history of other malignant neoplasm of large intestine: Secondary | ICD-10-CM | POA: Diagnosis not present

## 2023-06-19 MED ORDER — LIDOCAINE 5 % EX PTCH: 1.0000 | MEDICATED_PATCH | CUTANEOUS | 0 refills | Status: DC

## 2023-06-19 MED ORDER — METHOCARBAMOL 500 MG PO TABS: 500.0000 mg | ORAL_TABLET | Freq: Two times a day (BID) | ORAL | 0 refills | Status: DC

## 2023-06-19 MED ORDER — KETOROLAC TROMETHAMINE 15 MG/ML IJ SOLN
15.0000 mg | Freq: Once | INTRAMUSCULAR | Status: AC
  Administered 2023-06-19: 15 mg via INTRAMUSCULAR
  Filled 2023-06-19: qty 1

## 2023-06-19 MED ORDER — ONDANSETRON 4 MG PO TBDP
4.0000 mg | ORAL_TABLET | Freq: Once | ORAL | Status: AC
  Administered 2023-06-19: 4 mg via ORAL
  Filled 2023-06-19: qty 1

## 2023-06-19 MED ORDER — HYDROCODONE-ACETAMINOPHEN 5-325 MG PO TABS
1.0000 | ORAL_TABLET | Freq: Once | ORAL | Status: AC
  Administered 2023-06-19: 1 via ORAL
  Filled 2023-06-19: qty 1

## 2023-06-19 MED ORDER — HYDROCODONE-ACETAMINOPHEN 5-325 MG PO TABS: 1.0000 | ORAL_TABLET | Freq: Four times a day (QID) | ORAL | 0 refills | Status: DC | PRN

## 2023-06-19 NOTE — ED Notes (Signed)
 RN reviewed discharge instructions with pt. Pt verbalized understanding and had no further questions

## 2023-06-19 NOTE — ED Notes (Addendum)
 Pt's employer George In[employer] with [employer] called and inquired regarding form of drug testing available. Employer informed we do not have or do urine drug screens here, but that we do have oral swab drugs screens available. Employer informed that pt did refuse oral swab drug screen d/t pt's nausea.

## 2023-06-19 NOTE — ED Notes (Signed)
 Pt requested we do not speak with her employer regarding her visit at this point onward.

## 2023-06-19 NOTE — ED Notes (Signed)
 This RN witnessed drug screening process performed by [employer]. Pt agreed to and is complying with process.

## 2023-06-19 NOTE — ED Notes (Addendum)
 Patient advised of drug screen process and that process includes swab. Pt refused and advised she would leave this facility after discharge and have the drug screen done elsewhere.
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
## 2023-06-19 NOTE — Discharge Instructions (Addendum)
 You were seen in the emergency room today.  Your imaging is reassuring.  I would recommend taking Tylenol 1000 mg every 6 hours.  You can also take ibuprofen or naproxen over-the-counter.  Take Robaxin for muscle spasm.  Do not drive or operate heavy machinery with this medicine.  If you have breakthrough pain you can try ice and heat or lidocaine patch.  You can also take hydrocodone 5 mg which she can break in half and take half a pill.  Return to emergency room if you have any new or worsening symptoms.
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
## 2023-06-19 NOTE — ED Notes (Signed)
 Pt requested to speak with charge nurse. Charge nurse advised and came bedside to speak with patient.

## 2023-06-24 ENCOUNTER — Other Ambulatory Visit (HOSPITAL_COMMUNITY): Payer: Self-pay

## 2023-06-25 ENCOUNTER — Ambulatory Visit: Payer: Self-pay | Admitting: Family Medicine

## 2023-06-25 ENCOUNTER — Other Ambulatory Visit: Payer: Self-pay

## 2023-06-25 ENCOUNTER — Ambulatory Visit
Admission: RE | Admit: 2023-06-25 | Discharge: 2023-06-25 | Disposition: A | Discharge status: Home / Self Care | Payer: Self-pay | Source: Ambulatory Visit | Attending: Physician Assistant | Admitting: Physician Assistant

## 2023-06-25 VITALS — BP 143/95 | HR 90 | Temp 98.1°F | Resp 18

## 2023-06-25 DIAGNOSIS — F0781 Postconcussional syndrome: Secondary | ICD-10-CM

## 2023-06-25 DIAGNOSIS — G44319 Acute post-traumatic headache, not intractable: Secondary | ICD-10-CM

## 2023-06-25 DIAGNOSIS — S139XXD Sprain of joints and ligaments of unspecified parts of neck, subsequent encounter: Secondary | ICD-10-CM

## 2023-06-25 MED ORDER — METHOCARBAMOL 500 MG PO TABS
500.0000 mg | ORAL_TABLET | Freq: Two times a day (BID) | ORAL | 0 refills | Status: DC
Start: 2023-06-25 — End: 2023-07-08

## 2023-06-25 MED ORDER — KETOROLAC TROMETHAMINE 30 MG/ML IJ SOLN
30.0000 mg | Freq: Once | INTRAMUSCULAR | Status: AC
  Administered 2023-06-25: 30 mg via INTRAMUSCULAR
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
## 2023-06-25 NOTE — Discharge Instructions (Signed)
 We gave an injection of Toradol today.  Please do not take NSAIDs for the next 24 hours including aspirin, ibuprofen/Advil, naproxen/Aleve.  You can use Tylenol for breakthrough pain.  I have called in a refill of your Robaxin.  This can make you sleepy so do not drive or drink alcohol taking it.  Please follow-up with your primary care as scheduled on Monday.  If anything worsens and you have the worst headache of your life, nausea/vomiting, weakness, dizziness, visual change, confusion you need to go to the emergency room immediately.
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
## 2023-06-26 ENCOUNTER — Telehealth (HOSPITAL_COMMUNITY): Payer: Self-pay

## 2023-06-30 ENCOUNTER — Encounter: Payer: Self-pay | Admitting: Family Medicine

## 2023-06-30 ENCOUNTER — Ambulatory Visit (INDEPENDENT_AMBULATORY_CARE_PROVIDER_SITE_OTHER): Payer: Self-pay | Admitting: Family Medicine

## 2023-06-30 VITALS — BP 120/76 | HR 98 | Temp 98.8°F | Resp 18 | Ht 63.0 in | Wt 180.7 lb

## 2023-06-30 DIAGNOSIS — R110 Nausea: Secondary | ICD-10-CM

## 2023-06-30 DIAGNOSIS — F0781 Postconcussional syndrome: Secondary | ICD-10-CM

## 2023-06-30 MED ORDER — ONDANSETRON 4 MG PO TBDP: 4.0000 mg | ORAL_TABLET | Freq: Three times a day (TID) | ORAL | 0 refills | Status: DC | PRN

## 2023-06-30 NOTE — Assessment & Plan Note (Addendum)
 Cranial nerves 2-12 are intact. Continues to have positive Romberg, although, this has improved from last week (per report).  Two negative CT scans of head.  Cervical spine CT negative.  No photophobia or eye pain. Endorses phonophobia. Left sided headache. Whole body myalgias.  Intermittent blurred vision. Vision is grossly intact.  Intermittent dizziness.  No acute distress. 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 Zofran 4 mg every 8 hours as needed for nausea. Urgent referral placed for neurology evaluation.  Follow-up in one week with this PCP.  Work note provided.
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
## 2023-07-07 ENCOUNTER — Telehealth: Payer: Self-pay

## 2023-07-07 ENCOUNTER — Ambulatory Visit: Payer: Self-pay | Admitting: Family Medicine

## 2023-07-07 NOTE — Transitions of Care (Post Inpatient/ED Visit) (Signed)
 07/07/2023  Name: [name] MRN: [id number] DOB: [birth year]  Today's TOC FU Call Status: Today's TOC FU Call Status:: Successful TOC FU Call Completed TOC FU Call Complete Date: 07/07/23 Patient's Name and Date of Birth confirmed.  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 Patient Questions/Concerns Addressed: Notified Provider of Patient Questions/Concerns  Items Reviewed: Did you receive and understand the discharge instructions provided?: Yes Medications obtained,verified, and reconciled?: Yes (Medications Reviewed) Any new allergies since your discharge?: No Dietary orders reviewed?: Yes Type of Diet Ordered:: Patient states she eats heart healthy diet - low sodium Do you have support at home?: Yes People in Home: child(ren), adult, friend(s) Name of Support/Comfort Primary Source: friends, adult children, boyfriend  Medications Reviewed Today: Medications Reviewed Today     Reviewed by Jessy Oto, RN (Registered Nurse) on 07/07/23 at 1052  Med List Status: <None>   Medication Order Taking? Sig Documenting Provider Last Dose Status Informant  albuterol (VENTOLIN HFA) 108 (90 Base) MCG/ACT inhaler [id number]  Inhale 1-2 puffs into the lungs every 6 (six) hours as needed for wheezing or shortness of breath. Radford Pax, NP  Consider  Medication Status and Discontinue (No longer needed (for PRN medications))   albuterol (VENTOLIN HFA) 108 (90 Base) MCG/ACT inhaler [id number] Yes Inhale 2 puffs into the lungs every 6 (six) hours as needed for wheezing or shortness of breath. Elson Areas, PA-C Taking Active   ALPRAZolam Prudy Feeler) 0.5 MG tablet [id number] Yes Take 0.5-1 tablets (0.25-0.5 mg total) by mouth daily as needed for anxiety (panic). Sunnie Nielsen, DO Taking Active            Med Note [facility], Kelcy Baeten A   Mon Jul 07, 2023 10:38 AM) 07/06/23 [facility] d/c states 1 tab PRN  amiodarone (PACERONE) 200 MG tablet [id number] Yes Take 200 mg by mouth daily. 07/06/23- 07/10/23 Take 400 mgs (2 tabs) twice a day; Then 200 mg daily beginning 07/11/23 [provider] Taking Active   apixaban (ELIQUIS) 5 MG TABS tablet [id number] Yes Take 5 mg by mouth 2 (two) times daily. [provider] Taking Active            Med Note [facility] Hummingbird, Andrea Ferrer A   Mon Jul 07, 2023 10:50 AM) 07/07/23 called cardiology related to 07/06/23 APPT of 111  result for instruction patient held morning dose  benzonatate (TESSALON) 200 MG capsule [id number]  Take 1 capsule (200 mg total) by mouth 3 (three) times daily as needed for cough. Radford Pax, NP  Consider Medication Status and Discontinue (No longer needed (for PRN medications))   esomeprazole (NEXIUM) 20 MG capsule [id number] No Take 20 mg by mouth daily at 12 noon.  Patient not taking: Reported on 07/07/2023   [provider] Not Taking Consider Medication Status and Discontinue (No longer needed (for PRN medications))   famotidine (PEPCID) 20 MG tablet [id number] Yes Take 20 mg by mouth 2 (two) times  daily. [provider] Taking Active   HYDROcodone-acetaminophen (NORCO/VICODIN) 5-325 MG tablet [id number] No Take 1 tablet by mouth every 6 (six) hours as needed for severe pain (pain score 7-10).  Patient not taking: Reported on 07/07/2023   Barrett, Horald Chestnut, PA-C Unknown Active             Med Note [facility], Devanny Palecek A   Mon Jul 07, 2023 10:51 AM) Patient had to end call before this was discussed but stated we'd reviewed all meds-will discuss with next call  ibuprofen (ADVIL) 600 MG tablet [id number] No Take 1 tablet (600 mg total) by mouth every 6 (six) hours as needed.  Patient not taking: Reported on 07/07/2023   Fayrene Helper, PA-C Not Taking Active   ipratropium (ATROVENT) 0.06 % nasal spray [id number] No Place 2 sprays into both nostrils 3 (three) times daily. As needed for nasal congestion, runny nose  Patient not taking: Reported on 07/07/2023   Theadora Rama Scales, PA-C Not Taking Consider Medication Status and Discontinue (No longer needed (for PRN medications))   lidocaine (LIDODERM) 5 % [id number] Yes Place 1 patch onto the skin daily. Remove & Discard patch within 12 hours or as directed by MD Barrett, Horald Chestnut, PA-C Taking Active   methocarbamol (ROBAXIN) 500 MG tablet [id number] No Take 1 tablet (500 mg total) by mouth 2 (two) times daily.  Patient not taking: Reported on 07/07/2023   Jeani Hawking, PA-C Not Taking Active   Multiple Vitamin (MULTIVITAMIN) tablet [id number] Yes Take 1 tablet by mouth daily. [provider] Taking Active   ondansetron (ZOFRAN-ODT) 4 MG disintegrating tablet [id number]  Take 1 tablet (4 mg total) by mouth every 8 (eight) hours as needed for nausea or vomiting. Novella Olive, FNP  Consider Medication Status and Discontinue (Patient Preference)   predniSONE (DELTASONE) 50 MG tablet [id number]  One tablet a day for the next 5 days Elson Areas, [place]  Consider Medication Status and Discontinue (No longer needed (for PRN medications))   predniSONE (STERAPRED UNI-PAK 21 TAB) 10 MG (21) TBPK tablet [id number]  Take by mouth daily. Take 6 tabs by mouth daily  for 2 days, then 5 tabs for 2 days, then 4 tabs for 2 days, then 3 tabs for 2 days, 2 tabs for 2 days, then 1 tab by mouth daily for 2 days Coralyn Mark, NP  Consider Medication  Status and Discontinue (No longer needed (for PRN medications))   Probiotic Product (PROBIOTIC PO) [id number] Yes Take 1 tablet by mouth daily.  [provider] Taking Active Self  tamsulosin (FLOMAX) 0.4 MG CAPS capsule [id number] No Take 1 capsule (0.4 mg total) by mouth daily.  Patient not taking: Reported on 07/07/2023   Fayrene Helper, PA-C Not Taking Consider Medication Status and Discontinue (No longer needed (for PRN medications))   topiramate (TOPAMAX) 100 MG tablet [id number] No Take 1 tablet (100 mg total) by mouth 2 (two) times daily.  Patient not taking: Reported on 07/07/2023   Ocie Doyne, MD Not Taking Consider Medication Status and Discontinue (No longer needed (for PRN medications))   topiramate (TOPAMAX) 25 MG tablet [id number] No Take 25 mg at bedtime for 1 week. Then take 25 mg twice a day for 1 week. Then take 25 mg in AM and 50 mg (2 pills) in PM for 1 week. Then take 50 mg twice a day for 1 week. Then take 50 mg in AM and 75 mg (3 pills) in PM for 1 week.  Then take 75 mg twice a day for 1 week. Then take 75 mg in AM and 100 mg (4 pills) in PM for 1 week. Then take 100 mg twice a day.  Patient not taking: Reported on 07/07/2023   Ocie Doyne, MD Not Taking Consider Medication Status and Discontinue (No longer needed (for PRN medications))   Ubrogepant (UBRELVY) 100 MG TABS [id number] No Take 1 tablet (100 mg total) by mouth as needed (for migraine). May repeat a dose in 2 hours if needed. Max dose 2 pills in 24 hours  Patient not taking: Reported on 07/07/2023   Ocie Doyne, MD Not Taking Consider Medication Status and Discontinue (No longer needed (for PRN medications))             Home Care and Equipment/Supplies: Were Home Health Services Ordered?: No Any new equipment or medical supplies ordered?: No  Functional Questionnaire: Do you need assistance with bathing/showering or dressing?: No Do you need assistance with meal preparation?: No Do you need  assistance with eating?: No Do you have difficulty maintaining continence: No Do you need assistance with getting out of bed/getting out of a chair/moving?: No Do you have difficulty managing or taking your medications?: No  Follow up appointments reviewed: PCP Follow-up appointment confirmed?: Yes Date of PCP follow-up appointment?: 07/08/23 Follow-up Provider: Alden Hipp Specialist [facility] Follow-up appointment confirmed?: Yes Date of Specialist follow-up appointment?: 07/17/23 Follow-Up Specialty Provider:: Cardiology,William Thyra Breed, NP Do you need transportation to your follow-up appointment?: No Do you understand care options if your condition(s) worsen?: Yes-patient verbalized understanding    Goals Addressed               This Visit's Progress     TOC Care Plan - Patient will report no readmissions in the next 30 days (pt-stated)        Current Barriers:  Unable to assess SDOH, review benefits (may need SW related to no current insurance coverage), review State/Local services, assess pain/management related to patient arrived to her MD appointment and needed to end call  RNCM Clinical Goal(s):  Patient will work with the Care Management team over the next 30 days to address Transition of Care Barriers: SDOH,  benefits (may need SW related to no current insurance coverage), State/Local services, assess pain/management  Patient will take all medications exactly as prescribed and will call provider for medication related questions as evidenced by patient report and health record Patient will demonstrate understanding of rationale for each prescribed medication as evidenced by patient verbalizes understanding Patient will attend all scheduled medical appointments: Spine appointment 07/07/23, Echo & PCP appointment 07/08/23, Cardiology appointment 07/17/23 Patient will discuss Eliquis administration with cardiology related to APTT 111.3 prior to discharge    Interventions: Evaluation of current treatment plan related to  self management and patient's adherence to plan as established by provider  Transitions of Care:  New goal. Doctor Visits  - Patient has appointments scheduled and confirmed dates of doctor visits Conference call made with patient to office of  Cardiology, Gayla Medicus, NP related to patient question of should she take Eliquis with last APTT of 111.3   AFIB Interventions: (Status:  New goal.) Short Term Goal   Advised patient to discuss question regarding Eiquis with provider and to follow  up with them if she does not hear back Reviewed dosing of new Amiodarone (Taking 400mg  two times a day 07/06/23 - 07/09/33; On 07/11/23 start 200mg  daily    Patient Goals/Self-Care Activities: Participate in  Transition of Care Program/Attend TOC scheduled calls Notify RN Care Manager of TOC call rescheduling needs Take all medications as prescribed Attend all scheduled provider appointments Call pharmacy for medication refills 3-7 days in advance of running out of medications Patient will make a plan to eat healthy Patient will keep all lab appointments Patient will address Eliquis question with cardiology  Follow Up Plan:  Telephone follow up appointment with care management team member scheduled for:  07/15/23 10am The patient has been provided with contact information for the care management team and has been advised to call with any health related questions or concerns.           Hilbert Odor RN, CCM [place]  VBCI-Population Health RN Care Manager [phone]

## 2023-07-08 ENCOUNTER — Ambulatory Visit (INDEPENDENT_AMBULATORY_CARE_PROVIDER_SITE_OTHER): Payer: Worker's Compensation | Admitting: Family Medicine

## 2023-07-08 ENCOUNTER — Encounter: Payer: Self-pay | Admitting: Family Medicine

## 2023-07-08 VITALS — BP 143/89 | HR 75 | Ht 63.0 in | Wt 175.0 lb

## 2023-07-08 DIAGNOSIS — E063 Autoimmune thyroiditis: Secondary | ICD-10-CM | POA: Diagnosis not present

## 2023-07-08 DIAGNOSIS — I471 Supraventricular tachycardia, unspecified: Secondary | ICD-10-CM

## 2023-07-08 MED ORDER — LEVOTHYROXINE SODIUM 75 MCG PO TABS: 75.0000 ug | ORAL_TABLET | Freq: Every day | ORAL | 0 refills | Status: DC

## 2023-07-08 NOTE — Assessment & Plan Note (Signed)
 She will step down on her amiodarone dose on the 28th and then has a follow-up with EP coming up she is actually scheduled for an echocardiogram later today.  Currently on apixaban hopefully that will be temporary as well.

## 2023-07-08 NOTE — Progress Notes (Signed)
 Pt is taking Amiodarone (Taking 400mg  two times a day 07/06/23 - 07/09/33; On 07/11/23 start 200mg  daily and Eliquis 5 mg BID  She has a f/u appt with Cardiology on 4/3

## 2023-07-08 NOTE — Assessment & Plan Note (Signed)
 She actually took thyroid medication years ago ended up losing a lot of weight and it seemed to just self-correct.  But they did check a TSH in the hospital and it was elevated at 12.  So going to restart her at 75 mcg daily and then plan to recheck TSH in 6 to 8 weeks.

## 2023-07-08 NOTE — Patient Instructions (Signed)
 Will start levothyroxine 75 mcg daily and then plan to recheck TSH in 6 to 8 weeks.  We can make adjustments from there on the dosing.

## 2023-07-08 NOTE — Progress Notes (Signed)
   Established Patient Office Visit  Subjective  Patient ID: [name], female    DOB: [birth year]  Age: 53 y.o. MRN: [id number]  Chief Complaint  Patient presents with   Hospitalization Follow-up    HPI   She is here today for hospital follow-up.  She was admitted to [facility] on March 22 and discharged home the next day on March 23 for atrial fibrillation and SVT.  She has a history of hypothyroidism and asthma.  She had 1 day of palpitations and shortness of breath and and was found to have SVT.  They had recommended following up with PCP I am rechecking electrolytes and kidney function.  She was also scheduled follow-up with EP on March 25 and April 3.  She was discharged home on amiodarone 40 mg twice a day for 5 days followed by 200 mg daily and Eliquis 5 mg twice a day until she follows up with EP.    ROS    Objective:     BP (!) 143/89   Pulse 75   Ht 5\' 3"  (1.6 m)   Wt 175 lb (79.4 kg)   LMP 11/02/2011 (Exact Date)   SpO2 97%   BMI 31.00 kg/m    Physical Exam Vitals and nursing note reviewed.  Constitutional:      Appearance: Normal appearance.  HENT:     Head: Normocephalic and atraumatic.  Eyes:     Conjunctiva/sclera: Conjunctivae normal.  Cardiovascular:     Rate and Rhythm: Normal rate and regular rhythm.     Heart sounds: Murmur heard.  Pulmonary:     Effort: Pulmonary effort is normal.     Breath sounds: Normal breath sounds.  Skin:    General: Skin is warm and dry.  Neurological:     Mental Status: She is alert.  Psychiatric:        Mood and Affect: Mood normal.      No results found for any visits on 07/08/23.    The 10-year ASCVD risk score (Arnett DK, et al., 2019) is: 1.4%    Assessment & Plan:   Problem List Items Addressed This Visit       Cardiovascular and Mediastinum   SVT (supraventricular tachycardia) (HCC) - Primary   She will step down on her amiodarone dose on the 28th and then has a follow-up with EP  coming up she is actually scheduled for an echocardiogram later today.  Currently on apixaban hopefully that will be temporary as well.      Relevant Orders   CMP14+EGFR     Endocrine   Hypothyroidism (Chronic)   She actually took thyroid medication years ago ended up losing a lot of weight and it seemed to just self-correct.  But they did check a TSH in the hospital and it was elevated at 12.  So going to restart her at 75 mcg daily and then plan to recheck TSH in 6 to 8 weeks.      Relevant Medications   levothyroxine (SYNTHROID) 75 MCG tablet   Recheck labs and electrolytes per the recommendation she actually is feeling tremendously better.  Keep her follow-up appointments including echocardiogram scheduled for later today.  Plan will be to check on the thyroid in 6 to 8 weeks.  I do think being hypothyroid may have contributed to the onset of A-fib as well as recently increased stress levels.  No follow-ups on file.    Nani Gasser, MD

## 2023-07-09 ENCOUNTER — Encounter: Payer: Self-pay | Admitting: Family Medicine

## 2023-07-09 LAB — CMP14+EGFR
ALT: 19 IU/L (ref 0–32)
AST: 20 IU/L (ref 0–40)
Albumin: 4.9 g/dL (ref 3.8–4.9)
Alkaline Phosphatase: 81 IU/L (ref 44–121)
BUN/Creatinine Ratio: 11 (ref 9–23)
BUN: 11 mg/dL (ref 6–24)
Bilirubin Total: 0.8 mg/dL (ref 0.0–1.2)
CO2: 23 mmol/L (ref 20–29)
Calcium: 10 mg/dL (ref 8.7–10.2)
Chloride: 98 mmol/L (ref 96–106)
Creatinine, Ser: 1.03 mg/dL — ABNORMAL HIGH (ref 0.57–1.00)
Globulin, Total: 3 g/dL (ref 1.5–4.5)
Glucose: 102 mg/dL — ABNORMAL HIGH (ref 70–99)
Potassium: 4 mmol/L (ref 3.5–5.2)
Sodium: 142 mmol/L (ref 134–144)
Total Protein: 7.9 g/dL (ref 6.0–8.5)
eGFR: 65 mL/min/{1.73_m2} (ref >59)

## 2023-07-09 NOTE — Progress Notes (Signed)
 Hi Christy, kidney function was up just a little bit it looks like your baseline is usually 0.8-0.9 and it was 1.0 this time.  Just plan to keep an eye on it and recheck again in about 3 to 4 months.  Liver function is normal.  Also just wanted to mention that on your chart it says that you are due for repeat colonoscopy that they wanted to see you back in a couple years after you had your scope in 2013.  Did you go to another location?  I would like to get your chart updated.  Or if you did not I would like to refer you back for further evaluation if you are okay with that.  Also let me know if you see a GYN that you get your mammogram through.  We again would like to get your chart updated.

## 2023-07-15 ENCOUNTER — Other Ambulatory Visit: Payer: Self-pay

## 2023-07-15 NOTE — Patient Outreach (Signed)
 Care Management  Transitions of Care Program Transitions of Care Post-discharge week 2  07/15/2023 Name: VICKKI IGOU MRN: [id number] DOB: February 05, 1971  Subjective: JAMECA CHUMLEY is a 53 y.o. year old female who is a primary care patient of Charlton Amor, DO. The Care Management team spoke with patient by telephone to assess and address transitions of care needs, however patient states she is unable to keep scheduled appointment and was in a video class unable to reschedule. TOC RN will attempt again tomorrow.   Plan: Additional outreach attempts will be made to reach the patient enrolled in the [facility] Program (Post Inpatient/ED Visit).  Hilbert Odor RN, CCM [place]  VBCI-Population Health RN Care Manager [phone]

## 2023-07-16 ENCOUNTER — Telehealth: Payer: Self-pay

## 2023-07-16 NOTE — Patient Outreach (Signed)
 Care Management  Transitions of Care Program Transitions of Care Post-discharge week 2  07/16/2023 Name: [name] MRN: [id number] DOB: [birth year]  Subjective: [name] is a 53 y.o. year old female who is a primary care patient of Charlton Amor, DO. The Care Management team spoke with patient by telephone to assess and address transitions of care needs. Patient states she is in classes this week and is unable to schedule another call but requested [facility] RN call back next week  Plan: Additional outreach attempts will be made to reach the patient enrolled in the [facility] Program (Post Inpatient/ED Visit).  Hilbert Odor RN, CCM [place]  VBCI-Population Health RN Care Manager [phone]

## 2023-07-23 ENCOUNTER — Telehealth: Payer: Self-pay

## 2023-07-23 DIAGNOSIS — Z1331 Encounter for screening for depression: Secondary | ICD-10-CM
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
## 2023-07-25 ENCOUNTER — Telehealth: Payer: Self-pay | Admitting: *Deleted

## 2023-07-25 NOTE — Progress Notes (Signed)
 Complex Care Management Note  Care Guide Note 07/25/2023 Name: [name] MRN: [id number] DOB: 1970/08/10  [name] is a 53 y.o. year old female who sees Charlton Amor, DO for primary care. I reached out to [name] by phone today to offer complex care management services.  [name] was given information about Complex Care Management services today including:   The Complex Care Management services include support from the care team which includes your Nurse Care Manager, Clinical Social Worker, or Pharmacist.  The Complex Care Management team is here to help remove barriers to the health concerns and goals most important to you. Complex Care Management services are voluntary, and the patient may decline or stop services at any time by request to their care team member.   Complex Care Management Consent Status: Patient agreed to services and verbal consent obtained.   Follow up plan:  Telephone appointment with complex care management team member scheduled for:  07/28/2023  Encounter Outcome:  Patient Scheduled  Burman Nieves, CMA [place]  [facility], [facility] Guide Direct Dial: [phone]  Fax: [phone] Website: [email or web address]

## 2023-07-28 ENCOUNTER — Other Ambulatory Visit: Payer: Self-pay | Admitting: Licensed Clinical Social Worker

## 2023-07-29 NOTE — Patient Outreach (Signed)
 CSW completed call with pt to offer CCM services - pt rewuested call back at 4PM due to starting a new job and she would be off work then. CSW agreed. CSW attempted call back at 4PM - no answer and was unable to leave VM.  Hale Level, LCSW [place]/[facility], [facility] Licensed Clinical Social Worker Care Coordinator [phone]

## 2023-07-31 ENCOUNTER — Other Ambulatory Visit: Payer: Self-pay

## 2023-07-31 NOTE — Transitions of Care (Post Inpatient/ED Visit) (Signed)
 Care Management  Transitions of Care Program Transitions of Care Post-discharge week 4  07/31/2023 Name: DENINA RIEGER MRN: [id number] DOB: 1971-02-16  Subjective: [name] is a 53 y.o. year old female who is a primary care patient of Josepha Nickels, DO. The Care Management team spoke with patient by telephone to assess and address transitions of care needs. Patient was driving and requested to reschedule visit.   Plan: Visit moved to 08/07/23 at 11am  Tonia Frankel RN, CCM [place]  VBCI-Population Health RN Care Manager [phone]

## 2023-08-07 ENCOUNTER — Other Ambulatory Visit: Payer: Self-pay

## 2023-08-07 NOTE — Transitions of Care (Post Inpatient/ED Visit) (Signed)
 Transition of Care  week 5 TOC Program Closure   Visit Note  08/07/2023  Name: [name] MRN: [id number]          DOB: 1971/01/03  Situation: Patient enrolled in [facility] 30-day program. Visit completed with patient by telephone.   Background: Patient being followed by [facility] RN after discharge 07/06/23 related to SVT (supraventricular tachycardia - Atrial fib/flutter, transient. Patient states cardiology has Amiodarone on hold and will be sending a cardiac monitor after med on hold for 30 days. Patient states she is doing much better and is working. Patient denied further needs. Closing TOC program - goals met  Initial Transition Care Management Follow-up Telephone Call    Past Medical History:  Diagnosis Date   Adenomyosis 2013   Anemia    Anemia, iron  deficiency 12/23/2011   Anxiety    Asthma    Cancer of appendix (HCC)    Complication of anesthesia    scoline pain? 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) (HCC)    Pyelonephritis 12/08/2020   SVD (spontaneous vaginal delivery)    x3   Vitamin D  deficiency 02/02/2017    Assessment: Patient Reported Symptoms: Cognitive        Neurological Neurological Review of Symptoms: No symptoms reported    HEENT HEENT Symptoms Reported: Other: Other HEENT Symptoms/Conditions: patinet states paollen is causing allergic symptoms and is managing      Cardiovascular Cardiovascular Symptoms Reported: No symptoms reported    Respiratory   Respiratory Comment: Patient restates the pollen impacts and she tries to avoid  Endocrine Patient reports the following symptoms related to hypoglycemia or  hyperglycemia : No symptoms reported Endocrine Conditions: Thyroid disorder  Gastrointestinal Gastrointestinal Symptoms Reported: No symptoms reported Additional Gastrointestinal Details: patient states 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  Medications Reviewed Today     Reviewed by Sharmaine Dearth, RN (Registered Nurse) on 08/07/23 at 1106  Med List Status: <None>   Medication Order Taking? Sig Documenting Provider Last Dose Status Informant  albuterol  (VENTOLIN  HFA) 108 (90 Base) MCG/ACT inhaler [id number] Yes Inhale 2 puffs into the lungs every 6 (six) hours as needed for wheezing or shortness of breath. Sofia, Leslie K, PA-C Taking Active   ALPRAZolam  (XANAX ) 0.5 MG tablet [id number] Yes Take 0.5-1 tablets (0.25-0.5 mg total) by mouth daily as needed for anxiety (panic). Alexander, Natalie, DO Taking Active            Med Note [facility], Khrystina Bonnes A   Mon Jul 07, 2023 10:38 AM) 07/06/23 [facility] d/c states 1 tab PRN  amiodarone (PACERONE) 200 MG tablet [id number] No Take 200 mg by mouth daily. 07/06/23- 07/10/23 Take 400 mgs (2  tabs) twice a day; Then 200 mg daily beginning 07/11/23  Patient not taking: Reported on 08/07/2023   [provider] Not Taking Active            Med Note Bernadene Brewer, Cecelia Graciano A   Wed Jul 23, 2023 11:41 AM) Patient reports cardiology put this on hold and will be wearing heart monitor  apixaban (ELIQUIS) 5 MG TABS tablet [id number] Yes Take 5 mg by mouth 2 (two) times daily. [provider] Taking Active            Med Note Bernadene Brewer, Camey Edell A   Mon Jul 07, 2023 10:50 AM) 07/07/23 called cardiology related to  07/06/23 APPT of 111  result for instruction patient held morning dose  famotidine (PEPCID) 20 MG tablet [id number] Yes Take 20 mg by mouth 2 (two) times daily. [provider] Taking Active   levothyroxine  (SYNTHROID ) 75 MCG tablet [id number] Yes Take 1 tablet (75 mcg total) by mouth daily before breakfast. Cydney Draft, MD Taking Active   lidocaine  (LIDODERM ) 5 % [id number] No Place 1 patch onto the skin daily. Remove & Discard patch within 12 hours or as directed by MD  Patient not taking: Reported on 08/07/2023   Barrett, Kandace Organ, PA-C Not Taking Consider Medication Status and Discontinue (No longer needed (for PRN medications))   Multiple Vitamin (MULTIVITAMIN) tablet [id number] Yes Take 1 tablet by mouth daily. [provider] Taking Active   ondansetron  (ZOFRAN -ODT) 4 MG disintegrating tablet [id number] No Take 1 tablet (4 mg total) by mouth every 8 (eight) hours as needed for nausea or vomiting.  Patient not taking: Reported on 08/07/2023   Mickiel Albany, FNP Not Taking Consider Medication Status and Discontinue (No longer needed (for PRN medications))   Probiotic Product (PROBIOTIC PO) [id number] Yes Take 1 tablet by mouth daily.  [provider] Taking Active Self            Recommendation:   PCP Follow-up  Follow Up Plan:   Closing From:  Transitions of Care Program  Tonia Frankel RN, CCM [facility]  VBCI-Population Health RN Care Manager [phone]

## 2023-08-18 ENCOUNTER — Ambulatory Visit (INDEPENDENT_AMBULATORY_CARE_PROVIDER_SITE_OTHER): Payer: Self-pay | Admitting: Family Medicine

## 2023-08-18 ENCOUNTER — Encounter: Payer: Self-pay | Admitting: Family Medicine

## 2023-08-18 VITALS — BP 131/82 | HR 74 | Ht 63.0 in | Wt 183.0 lb

## 2023-08-18 DIAGNOSIS — F4321 Adjustment disorder with depressed mood: Secondary | ICD-10-CM

## 2023-08-18 DIAGNOSIS — F331 Major depressive disorder, recurrent, moderate: Secondary | ICD-10-CM

## 2023-08-18 DIAGNOSIS — E063 Autoimmune thyroiditis: Secondary | ICD-10-CM | POA: Diagnosis not present

## 2023-08-18 NOTE — Assessment & Plan Note (Addendum)
 She is getting in to [facility] for counseling at the end of the month.  They prescribe her Xanax  which she uses as needed.  We did discuss the possibility of adding an SSRI for better mood control.  She will be getting in with them soon so can discuss further.  Think working with a therapist or counselor would be helpful as well she already does a great job in doing daily meditation this is just been really struggle for her.  Happy to write her out of work for today okay to return tomorrow.

## 2023-08-18 NOTE — Progress Notes (Signed)
   Established Patient Office Visit  Subjective  Patient ID: [name], female    DOB: 03-01-1971  Age: 53 y.o. MRN: [id number]  Chief Complaint  Patient presents with   Hypothyroidism    HPI  They d/c'd her amiodarone and she stopped her Eliquis sec to nose bleeds. Cardiology is aware.  The nosebleeds have stopped.  She is scheduled for further workup with a heart monitor but they want her off the amiodarone for a full month before they started it infectious to call them today to get that scheduled.   Her step father passed away about 2 weeks ago this is been really difficult for her.   ROS    Objective:     BP 131/82   Pulse 74   Ht 5\' 3"  (1.6 m)   Wt 183 lb (83 kg)   LMP 11/02/2011 (Exact Date)   SpO2 100%   BMI 32.42 kg/m     Physical Exam Vitals and nursing note reviewed.  Constitutional:      Appearance: Normal appearance.  HENT:     Head: Normocephalic and atraumatic.  Eyes:     Conjunctiva/sclera: Conjunctivae normal.  Cardiovascular:     Rate and Rhythm: Normal rate and regular rhythm.  Pulmonary:     Effort: Pulmonary effort is normal.     Breath sounds: Normal breath sounds.  Skin:    General: Skin is warm and dry.  Neurological:     Mental Status: She is alert.  Psychiatric:        Mood and Affect: Mood normal.      No results found for any visits on 08/18/23.     The 10-year ASCVD risk score (Arnett DK, et al., 2019) is: 1.2%    Assessment & Plan:   Problem List Items Addressed This Visit       Endocrine   Hypothyroidism - Primary (Chronic)   Due to recheck TSH. She started new dose about 3 weeks ago.  Labs  in another 3 weeks for recheck.       Relevant Orders   TSH + free T4     Other   Moderate episode of recurrent major depressive disorder (HCC)   She is getting in to [facility] for counseling at the end of the month.  They prescribe her Xanax  which she uses as needed.  We did discuss the possibility of adding  an SSRI for better mood control.  She will be getting in with them soon so can discuss further.  Think working with a therapist or counselor would be helpful as well she already does a great job in doing daily meditation this is just been really struggle for her.  Happy to write her out of work for today okay to return tomorrow.      Other Visit Diagnoses       Grief           Grief -okay to stay out of work today if she just needs a mental health day to recover.  I am happy to provide a work note if needed.  [facility] unfortunately she could not make the last appointment.  Now that she has health insurance she is hoping it will be covered.  Return in about 3 months (around 11/18/2023) for PCP.    Duaine German, MD

## 2023-08-18 NOTE — Assessment & Plan Note (Signed)
 Due to recheck TSH. She started new dose about 3 weeks ago.  Labs  in another 3 weeks for recheck.

## 2023-08-21 ENCOUNTER — Encounter: Payer: Self-pay | Admitting: Family Medicine

## 2023-08-25 ENCOUNTER — Telehealth: Payer: Self-pay | Admitting: *Deleted

## 2023-08-25 NOTE — Progress Notes (Signed)
 Complex Care Management Care Guide Note  08/25/2023 Name: [name] MRN: [id number] DOB: 28-Sep-1970  [name] is a 53 y.o. year old female who is a primary care patient of Josepha Nickels, DO and is actively engaged with the care management team. I reached out to Bryon Caraway by phone today to assist with re-scheduling  with the Licensed Clinical Child psychotherapist.  Follow up plan: Unsuccessful telephone outreach attempt made.  Patient stated she will call back.   Barnie Bora  [facility]  [facility]Based [facility] Institute, [facility] Guide  Direct Dial: [phone]  Fax [phone] .

## 2023-09-02 NOTE — Progress Notes (Signed)
 Complex Care Management Care Guide Note  09/02/2023 Name: [name] MRN: [id number] DOB: 01/13/1971  [name] is a 53 y.o. year old female who is a primary care patient of Josepha Nickels, DO and is actively engaged with the care management team. I reached out to Bryon Caraway by phone today to assist with scheduling  with the Licensed Clinical Social Worker.  Follow up plan: Telephone appointment with complex care management team member scheduled for:  09/12/23 Barnie Bora  [facility]  [facility], [facility] Guide  Direct Dial: [phone]  Fax [phone]

## 2023-09-12 ENCOUNTER — Other Ambulatory Visit: Payer: Self-pay | Admitting: Licensed Clinical Social Worker

## 2023-09-12 NOTE — Patient Outreach (Signed)
 Complex Care Management   Visit Note  09/12/2023  Name:  [name] MRN: [id number] DOB: 11/22/1970  Situation: Referral received for Complex Care Management related to Mental/Behavioral Health diagnosis PTSD I obtained verbal consent from Patient.  Visit completed with patient  on the phone  Background:   Past Medical History:  Diagnosis Date   Adenomyosis 2013   Anemia    Anemia, iron  deficiency 12/23/2011   Anxiety    Asthma    Cancer of appendix (HCC)    Complication of anesthesia    scoline pain? 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)    Pyelonephritis 12/08/2020   SVD (spontaneous vaginal delivery)    x3   Vitamin D  deficiency 02/02/2017    Assessment: Patient Reported Symptoms:  Cognitive Cognitive Status: Alert and oriented to person, place, and time, Insightful and able to interpret abstract concepts, Normal speech and language skills Cognitive/Intellectual Conditions Management [RPT]: None reported or documented in medical history or problem list   Health Maintenance Behaviors: None  Neurological Neurological Review of Symptoms: Not assessed    HEENT HEENT Symptoms Reported: Not assessed      Cardiovascular Cardiovascular Symptoms Reported: Not assessed    Respiratory Respiratory Symptoms Reported: Not assesed    Endocrine Patient reports the following symptoms related to hypoglycemia or hyperglycemia : Not assessed    Gastrointestinal Gastrointestinal Symptoms Reported: Not assessed      Genitourinary Genitourinary Symptoms Reported: Not assessed    Integumentary Integumentary Symptoms Reported: Not  assessed    Musculoskeletal Musculoskelatal Symptoms Reviewed: Not assessed        Psychosocial Psychosocial Symptoms Reported: Other Other Psychosocial Conditions: PTSD Additional Psychological Details: Seeing psychiatrist and will be going to [facility] for [facility] Therapy Behavioral Health Conditions: Post traumatic stress Behavioral Management Strategies: Counseling, Medication therapy Behavioral Health Self-Management Outcome: 4 (good) Major Change/Loss/Stressor/Fears (CP): Medical condition, self, Resources Techniques to [place] with Loss/Stress/Change: Counseling, Medication        08/18/2023    8:19 AM  Depression screen PHQ 2/9  Decreased Interest 1  Down, Depressed, Hopeless 1  PHQ - 2 Score 2  Altered sleeping 1  Tired, decreased energy 1  Change in appetite 1  Feeling bad or failure about yourself  0  Trouble concentrating 1  Moving slowly or fidgety/restless 0  Suicidal thoughts 0  PHQ-9 Score 6  Difficult doing work/chores Somewhat difficult    There were no vitals filed for this visit.  Medications Reviewed Today     Reviewed by Jens Molder, LCSW (Social Worker) on 09/12/23 at [id number]  Med List Status: <None>   Medication Order Taking? Sig Documenting Provider Last Dose Status Informant  albuterol  (VENTOLIN  HFA) 108 (90 Base) MCG/ACT inhaler [id number] No Inhale 2 puffs into the lungs every 6 (six) hours as needed for wheezing or shortness of breath. Sandi Crosby, PA-C Taking Active   ALPRAZolam  (XANAX ) 0.5 MG tablet [id number] No Take 0.5-1 tablets (0.25-0.5 mg total) by mouth daily as needed for anxiety (panic). Alexander, Natalie, DO Taking Active  Med Note Bernadene Brewer, RHONDA A   Mon Jul 07, 2023 10:38 AM) 07/06/23 [facility] d/c states 1 tab PRN  famotidine (PEPCID) 20 MG tablet [id number] No Take 20 mg by mouth 2 (two) times daily. [provider] Taking Active   levothyroxine  (SYNTHROID ) 75 MCG tablet [id number] No Take 1 tablet (75  mcg total) by mouth daily before breakfast. Cydney Draft, MD Taking Active   lidocaine  (LIDODERM ) 5 % [id number] No Place 1 patch onto the skin daily. Remove & Discard patch within 12 hours or as directed by MD Barrett, Jamie N, PA-C Taking Active   Multiple Vitamin (MULTIVITAMIN) tablet [id number] No Take 1 tablet by mouth daily. [provider] Taking Active   Probiotic Product (PROBIOTIC PO) [id number] No Take 1 tablet by mouth daily.  [provider] Taking Active Self            Recommendation:   Continue Current Plan of Care Has been connected to counseling services, active with a psychiatrist.  Follow Up Plan:   Patient has met all care management goals. Care Management case will be closed. Patient has been provided contact information should new needs arise.   Hale Level, LCSW [place]/[facility], [facility] Licensed Clinical Social Worker Care Coordinator [phone]

## 2023-09-12 NOTE — Patient Instructions (Signed)
 Visit Information  Thank you for taking time to visit with me today. Please don't hesitate to contact me if I can be of assistance to you before our next scheduled appointment.    Please call the care guide team at [phone] if you need to cancel, schedule, or reschedule an appointment.   Please call the Suicide and Crisis Lifeline: 988 if you are experiencing a Mental Health or Behavioral Health Crisis or need someone to talk to.  Hale Level, LCSW [place]/[facility], [facility] Licensed Clinical Social Worker Care Coordinator [phone]

## 2023-10-24 ENCOUNTER — Ambulatory Visit

## 2023-11-01 ENCOUNTER — Ambulatory Visit

## 2023-11-17 ENCOUNTER — Emergency Department (HOSPITAL_BASED_OUTPATIENT_CLINIC_OR_DEPARTMENT_OTHER)
Admission: EM | Admit: 2023-11-17 | Discharge: 2023-11-17 | Disposition: A | Discharge status: Home / Self Care | Attending: Emergency Medicine | Admitting: Emergency Medicine

## 2023-11-17 ENCOUNTER — Emergency Department (HOSPITAL_BASED_OUTPATIENT_CLINIC_OR_DEPARTMENT_OTHER)

## 2023-11-17 ENCOUNTER — Other Ambulatory Visit: Payer: Self-pay

## 2023-11-17 ENCOUNTER — Encounter (HOSPITAL_BASED_OUTPATIENT_CLINIC_OR_DEPARTMENT_OTHER): Payer: Self-pay | Admitting: Emergency Medicine

## 2023-11-17 ENCOUNTER — Other Ambulatory Visit (HOSPITAL_BASED_OUTPATIENT_CLINIC_OR_DEPARTMENT_OTHER): Payer: Self-pay

## 2023-11-17 DIAGNOSIS — Z79899 Other long term (current) drug therapy: Secondary | ICD-10-CM | POA: Diagnosis not present

## 2023-11-17 DIAGNOSIS — E039 Hypothyroidism, unspecified: Secondary | ICD-10-CM | POA: Insufficient documentation

## 2023-11-17 DIAGNOSIS — W01198A Fall on same level from slipping, tripping and stumbling with subsequent striking against other object, initial encounter: Secondary | ICD-10-CM | POA: Diagnosis not present

## 2023-11-17 DIAGNOSIS — J45909 Unspecified asthma, uncomplicated: Secondary | ICD-10-CM | POA: Insufficient documentation

## 2023-11-17 DIAGNOSIS — S060X0A Concussion without loss of consciousness, initial encounter: Secondary | ICD-10-CM | POA: Diagnosis not present

## 2023-11-17 DIAGNOSIS — S0990XA Unspecified injury of head, initial encounter: Secondary | ICD-10-CM | POA: Diagnosis present

## 2023-11-17 DIAGNOSIS — S161XXA Strain of muscle, fascia and tendon at neck level, initial encounter: Secondary | ICD-10-CM | POA: Diagnosis not present

## 2023-11-17 LAB — CBC WITH DIFFERENTIAL/PLATELET
Abs Immature Granulocytes: 0.01 K/uL (ref 0.00–0.07)
Basophils Absolute: 0.1 K/uL (ref 0.0–0.1)
Basophils Relative: 1 %
Eosinophils Absolute: 0.2 K/uL (ref 0.0–0.5)
Eosinophils Relative: 2 %
HCT: 40.4 % (ref 36.0–46.0)
Hemoglobin: 13.7 g/dL (ref 12.0–15.0)
Immature Granulocytes: 0 %
Lymphocytes Relative: 29 %
Lymphs Abs: 2.1 K/uL (ref 0.7–4.0)
MCH: 31.8 pg (ref 26.0–34.0)
MCHC: 33.9 g/dL (ref 30.0–36.0)
MCV: 93.7 fL (ref 80.0–100.0)
Monocytes Absolute: 0.5 K/uL (ref 0.1–1.0)
Monocytes Relative: 7 %
Neutro Abs: 4.4 K/uL (ref 1.7–7.7)
Neutrophils Relative %: 61 %
Platelets: 243 K/uL (ref 150–400)
RBC: 4.31 MIL/uL (ref 3.87–5.11)
RDW: 13.2 % (ref 11.5–15.5)
WBC: 7.2 K/uL (ref 4.0–10.5)
nRBC: 0 % (ref 0.0–0.2)

## 2023-11-17 LAB — COMPREHENSIVE METABOLIC PANEL WITH GFR
ALT: 18 U/L (ref 0–44)
AST: 21 U/L (ref 15–41)
Albumin: 4.5 g/dL (ref 3.5–5.0)
Alkaline Phosphatase: 79 U/L (ref 38–126)
Anion gap: 11 (ref 5–15)
BUN: 11 mg/dL (ref 6–20)
CO2: 27 mmol/L (ref 22–32)
Calcium: 9.7 mg/dL (ref 8.9–10.3)
Chloride: 101 mmol/L (ref 98–111)
Creatinine, Ser: 0.86 mg/dL (ref 0.44–1.00)
GFR, Estimated: >60 mL/min (ref >60)
Glucose, Bld: 106 mg/dL — ABNORMAL HIGH (ref 70–99)
Potassium: 3.8 mmol/L (ref 3.5–5.1)
Sodium: 139 mmol/L (ref 135–145)
Total Bilirubin: 0.8 mg/dL (ref 0.0–1.2)
Total Protein: 7.5 g/dL (ref 6.5–8.1)

## 2023-11-17 MED ORDER — LACTATED RINGERS IV BOLUS
1000.0000 mL | Freq: Once | INTRAVENOUS | Status: AC
  Administered 2023-11-17: 1000 mL via INTRAVENOUS

## 2023-11-17 MED ORDER — PROMETHAZINE HCL 25 MG/ML IJ SOLN
INTRAMUSCULAR | Status: AC
  Filled 2023-11-17: qty 1

## 2023-11-17 MED ORDER — PROMETHAZINE (PHENERGAN) 6.25MG IN NS 50ML IVPB
6.2500 mg | Freq: Four times a day (QID) | INTRAVENOUS | Status: DC | PRN
  Administered 2023-11-17: 6.25 mg via INTRAVENOUS
  Filled 2023-11-17: qty 50

## 2023-11-17 MED ORDER — METHOCARBAMOL 500 MG PO TABS
500.0000 mg | ORAL_TABLET | Freq: Once | ORAL | Status: AC
  Administered 2023-11-17: 500 mg via ORAL
  Filled 2023-11-17: qty 1

## 2023-11-17 MED ORDER — METHOCARBAMOL 500 MG PO TABS
500.0000 mg | ORAL_TABLET | Freq: Two times a day (BID) | ORAL | 0 refills | Status: DC
  Filled 2023-11-17: qty 20, 10d supply, fill #0

## 2023-11-17 NOTE — Discharge Instructions (Addendum)
 The CAT scan today looked okay.  There is no sign of bleeding.  No fractures of your spine.  However suspect that you have pulled your muscle.  You can continue Tylenol  and ibuprofen  but you can also use the Robaxin .  Also you need to incorporate brain rest to help with the concussion.

## 2023-11-17 NOTE — ED Triage Notes (Signed)
 Pt c/o headache, nausea and neck pain after fall on Saturday night. Pt states she had a stomach bug and passed out and hit her frontal on marble floor. Hx concussion in March from [facility].
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
## 2023-11-23 ENCOUNTER — Other Ambulatory Visit: Payer: Self-pay

## 2023-11-23 ENCOUNTER — Ambulatory Visit
Admission: RE | Admit: 2023-11-23 | Discharge: 2023-11-23 | Disposition: A | Discharge status: Home / Self Care | Source: Ambulatory Visit | Attending: Physician Assistant | Admitting: Physician Assistant

## 2023-11-23 VITALS — BP 134/86 | HR 82 | Temp 98.0°F | Resp 18

## 2023-11-23 DIAGNOSIS — S060X9D Concussion with loss of consciousness of unspecified duration, subsequent encounter: Secondary | ICD-10-CM

## 2023-11-23 MED ORDER — PREDNISONE 20 MG PO TABS
40.0000 mg | ORAL_TABLET | Freq: Every day | ORAL | 0 refills | Status: AC
Start: 2023-11-23 — End: 2023-11-28
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
## 2023-11-23 NOTE — Discharge Instructions (Addendum)
 You are seen today for follow-up for concussion that you sustained last week.  At this time your neurological exam appears reassuring.  You do have some lingering spasms and tension on the left side of your neck for which I have prescribed a prednisone  burst for you to take by mouth once per day.  I recommend taking this in the morning with breakfast.  Please be advised that steroids can cause sleeplessness, elevated blood sugar, increased hunger and irritability.  The symptoms should decrease and resolve as you finish the steroid burst. I have supplied you with a return to work note.  It is attached to your paperwork.  If at any point you feel like you are having significant difficulty with your work or if you need accommodations you can always return to urgent care or follow-up with your PCP for further evaluation.

## 2023-11-23 NOTE — ED Triage Notes (Addendum)
 Pt presents to urgent care today needing a note to return to work tomorrow. Seen at [facility] ED on 8/4 for concussion. Currently experiencing neck pain. Rates a 5/10 at this time. Using lidocaine  patches and OTC Tylenol /Ibuprofen  with little pain relief. Fatigue reported as well. Dizzy following the concussion however this has now resolved. Attempting to manage pain with neck exercises recommended by friend who is a physical therapist.

## 2023-12-11 ENCOUNTER — Encounter (HOSPITAL_BASED_OUTPATIENT_CLINIC_OR_DEPARTMENT_OTHER): Payer: Self-pay | Admitting: Emergency Medicine

## 2023-12-11 ENCOUNTER — Other Ambulatory Visit (HOSPITAL_BASED_OUTPATIENT_CLINIC_OR_DEPARTMENT_OTHER): Payer: Self-pay

## 2023-12-11 ENCOUNTER — Other Ambulatory Visit: Payer: Self-pay

## 2023-12-11 ENCOUNTER — Emergency Department (HOSPITAL_BASED_OUTPATIENT_CLINIC_OR_DEPARTMENT_OTHER)

## 2023-12-11 ENCOUNTER — Emergency Department (HOSPITAL_BASED_OUTPATIENT_CLINIC_OR_DEPARTMENT_OTHER)
Admission: EM | Admit: 2023-12-11 | Discharge: 2023-12-11 | Disposition: A | Discharge status: Home / Self Care | Attending: Emergency Medicine | Admitting: Emergency Medicine

## 2023-12-11 DIAGNOSIS — N390 Urinary tract infection, site not specified: Secondary | ICD-10-CM | POA: Diagnosis not present

## 2023-12-11 DIAGNOSIS — R109 Unspecified abdominal pain: Secondary | ICD-10-CM | POA: Diagnosis present

## 2023-12-11 DIAGNOSIS — N200 Calculus of kidney: Secondary | ICD-10-CM | POA: Diagnosis not present

## 2023-12-11 LAB — COMPREHENSIVE METABOLIC PANEL WITH GFR
ALT: 16 U/L (ref 0–44)
AST: 22 U/L (ref 15–41)
Albumin: 4.7 g/dL (ref 3.5–5.0)
Alkaline Phosphatase: 86 U/L (ref 38–126)
Anion gap: 11 (ref 5–15)
BUN: 11 mg/dL (ref 6–20)
CO2: 27 mmol/L (ref 22–32)
Calcium: 10.2 mg/dL (ref 8.9–10.3)
Chloride: 100 mmol/L (ref 98–111)
Creatinine, Ser: 0.94 mg/dL (ref 0.44–1.00)
GFR, Estimated: >60 mL/min (ref >60)
Glucose, Bld: 90 mg/dL (ref 70–99)
Potassium: 4.2 mmol/L (ref 3.5–5.1)
Sodium: 139 mmol/L (ref 135–145)
Total Bilirubin: 0.6 mg/dL (ref 0.0–1.2)
Total Protein: 8.1 g/dL (ref 6.5–8.1)

## 2023-12-11 LAB — CBC WITH DIFFERENTIAL/PLATELET
Abs Immature Granulocytes: 0.01 K/uL (ref 0.00–0.07)
Basophils Absolute: 0.1 K/uL (ref 0.0–0.1)
Basophils Relative: 1 %
Eosinophils Absolute: 0.4 K/uL (ref 0.0–0.5)
Eosinophils Relative: 6 %
HCT: 41.8 % (ref 36.0–46.0)
Hemoglobin: 14.3 g/dL (ref 12.0–15.0)
Immature Granulocytes: 0 %
Lymphocytes Relative: 29 %
Lymphs Abs: 2.1 K/uL (ref 0.7–4.0)
MCH: 32.2 pg (ref 26.0–34.0)
MCHC: 34.2 g/dL (ref 30.0–36.0)
MCV: 94.1 fL (ref 80.0–100.0)
Monocytes Absolute: 0.4 K/uL (ref 0.1–1.0)
Monocytes Relative: 6 %
Neutro Abs: 4.2 K/uL (ref 1.7–7.7)
Neutrophils Relative %: 58 %
Platelets: 253 K/uL (ref 150–400)
RBC: 4.44 MIL/uL (ref 3.87–5.11)
RDW: 13.2 % (ref 11.5–15.5)
WBC: 7.3 K/uL (ref 4.0–10.5)
nRBC: 0 % (ref 0.0–0.2)

## 2023-12-11 LAB — URINALYSIS, ROUTINE W REFLEX MICROSCOPIC
Bilirubin Urine: NEGATIVE
Glucose, UA: NEGATIVE mg/dL
Hgb urine dipstick: NEGATIVE
Ketones, ur: NEGATIVE mg/dL
Nitrite: NEGATIVE
Protein, ur: NEGATIVE mg/dL
Specific Gravity, Urine: <1.005 — ABNORMAL LOW (ref 1.005–1.030)
pH: 7 (ref 5.0–8.0)

## 2023-12-11 LAB — LIPASE, BLOOD: Lipase: 32 U/L (ref 11–51)

## 2023-12-11 LAB — PREGNANCY, URINE: Preg Test, Ur: NEGATIVE

## 2023-12-11 MED ORDER — ONDANSETRON 4 MG PO TBDP
4.0000 mg | ORAL_TABLET | Freq: Three times a day (TID) | ORAL | 0 refills | Status: AC | PRN
Start: 2023-12-11 — End: ?
  Filled 2023-12-11: qty 18, 21d supply, fill #0

## 2023-12-11 MED ORDER — SODIUM CHLORIDE 0.9 % IV SOLN
1.0000 g | Freq: Once | INTRAVENOUS | Status: AC
  Administered 2023-12-11: 1 g via INTRAVENOUS
  Filled 2023-12-11: qty 10

## 2023-12-11 MED ORDER — MORPHINE SULFATE (PF) 4 MG/ML IV SOLN
4.0000 mg | Freq: Once | INTRAVENOUS | Status: AC
  Administered 2023-12-11: 4 mg via INTRAVENOUS
  Filled 2023-12-11: qty 1

## 2023-12-11 MED ORDER — KETOROLAC TROMETHAMINE 15 MG/ML IJ SOLN
15.0000 mg | Freq: Once | INTRAMUSCULAR | Status: AC
  Administered 2023-12-11: 15 mg via INTRAVENOUS
  Filled 2023-12-11: qty 1

## 2023-12-11 MED ORDER — CEPHALEXIN 500 MG PO CAPS
500.0000 mg | ORAL_CAPSULE | Freq: Three times a day (TID) | ORAL | 0 refills | Status: AC
  Filled 2023-12-11: qty 21, 7d supply, fill #0

## 2023-12-11 MED ORDER — HYDROCODONE-ACETAMINOPHEN 5-325 MG PO TABS
1.0000 | ORAL_TABLET | Freq: Four times a day (QID) | ORAL | 0 refills | Status: DC | PRN
  Filled 2023-12-11: qty 10, 4d supply, fill #0

## 2023-12-11 MED ORDER — SODIUM CHLORIDE 0.9 % IV BOLUS
1000.0000 mL | Freq: Once | INTRAVENOUS | Status: AC
  Administered 2023-12-11: 1000 mL via INTRAVENOUS

## 2023-12-11 MED ORDER — ONDANSETRON HCL 4 MG/2ML IJ SOLN
4.0000 mg | Freq: Once | INTRAMUSCULAR | Status: AC
  Administered 2023-12-11: 4 mg via INTRAVENOUS
  Filled 2023-12-11: qty 2

## 2023-12-11 NOTE — ED Triage Notes (Signed)
 Pt via pov from UC with bilateral flank pain, worse on left beginning yesterday. She states she passed a kidney stone last night but still has burning upon urination and flank pain. Pt a&o x 4; nad noted.

## 2023-12-11 NOTE — Discharge Instructions (Addendum)
 Please take all antibiotics as directed.  Follow-up closely with your primary care doctor and urology on an outpatient basis.  Return to emergency department immediately for any new or worsening symptoms.

## 2023-12-11 NOTE — ED Provider Notes (Signed)
 [facility] AT [facility] Provider Note   CSN: [id number] Arrival date & time: 12/11/23  1324     Patient presents with: Flank Pain   [name] is a 53 y.o. female.   Patient is a 53 year old female with a past medical history of kidney stones who presents emergency department chief complaint of bilateral flank pain and dysuria.  Patient notes that she did pass a stone this morning but notes that she continues to have ongoing pain and dysuria.  She notes that she does have a history of pyelonephritis.  She has had no associated fever or chills.  She denies any nausea, vomiting, diarrhea.  Patient denies any recent falls or blunt abdominal wall or back trauma.   Flank Pain       Prior to Admission medications   Medication Sig Start Date End Date Taking? Authorizing Provider  albuterol  (VENTOLIN  HFA) 108 (90 Base) MCG/ACT inhaler Inhale 2 puffs into the lungs every 6 (six) hours as needed for wheezing or shortness of breath. 01/03/23   Sofia, Leslie K, PA-C  ALPRAZolam  (XANAX ) 0.5 MG tablet Take 0.5-1 tablets (0.25-0.5 mg total) by mouth daily as needed for anxiety (panic). 04/12/19   Alexander, Natalie, DO  famotidine (PEPCID) 20 MG tablet Take 20 mg by mouth 2 (two) times daily.    [provider]  levothyroxine  (SYNTHROID ) 75 MCG tablet Take 1 tablet (75 mcg total) by mouth daily before breakfast. 07/08/23   Alvan Dorothyann BIRCH, MD  lidocaine  (LIDODERM ) 5 % Place 1 patch onto the skin daily. Remove & Discard patch within 12 hours or as directed by MD 06/19/23   Barrett, Jamie N, PA-C  methocarbamol  (ROBAXIN ) 500 MG tablet Take 1 tablet (500 mg total) by mouth 2 (two) times daily. 11/17/23   Doretha Folks, MD  Multiple Vitamin (MULTIVITAMIN) tablet Take 1 tablet by mouth daily.    [provider]  Probiotic Product (PROBIOTIC PO) Take 1 tablet by mouth daily.     [provider]    Allergies: Other, Reglan   [metoclopramide ], Cetacaine [butamben-tetracaine-benzocaine], Aloe, Ciprofloxacin , Clarithromycin, and Moxifloxacin    Review of Systems  Genitourinary:  Positive for flank pain.  All other systems reviewed and are negative.   Updated Vital Signs BP 128/80 (BP Location: Left Arm)   Pulse 72   Temp 98 F (36.7 C) (Oral)   Resp 16   Ht 5' 3 (1.6 m)   Wt 83 kg   LMP 11/02/2011 (Exact Date)   SpO2 100%   BMI 32.41 kg/m   Physical Exam Vitals and nursing note reviewed.  Constitutional:      General: She is not in acute distress.    Appearance: Normal appearance. She is not ill-appearing.  HENT:     Head: Normocephalic and atraumatic.     Nose: Nose normal.     Mouth/Throat:     Mouth: Mucous membranes are moist.  Eyes:     Extraocular Movements: Extraocular movements intact.     Conjunctiva/sclera: Conjunctivae normal.     Pupils: Pupils are equal, round, and reactive to light.  Cardiovascular:     Rate and Rhythm: Normal rate and regular rhythm.     Pulses: Normal pulses.     Heart sounds: Normal heart sounds. No murmur heard.    No gallop.  Pulmonary:     Effort: Pulmonary effort is normal. No respiratory distress.     Breath sounds: Normal breath sounds. No stridor. No wheezing, rhonchi or  rales.  Abdominal:     General: Abdomen is flat. Bowel sounds are normal. There is no distension.     Palpations: Abdomen is soft.     Tenderness: There is no guarding.     Comments: Mild tenderness patient over left side of abdomen and flank  Musculoskeletal:        General: Normal range of motion.     Cervical back: Normal range of motion and neck supple.  Skin:    General: Skin is warm and dry.     Findings: No bruising or rash.  Neurological:     General: No focal deficit present.     Mental Status: She is alert and oriented to person, place, and time. Mental status is at baseline.  Psychiatric:        Mood and Affect: Mood normal.        Behavior: Behavior normal.         Thought Content: Thought content normal.        Judgment: Judgment normal.     (all labs ordered are listed, but only abnormal results are displayed) Labs Reviewed  URINALYSIS, ROUTINE W REFLEX MICROSCOPIC  PREGNANCY, URINE  COMPREHENSIVE METABOLIC PANEL WITH GFR  LIPASE, BLOOD  CBC WITH DIFFERENTIAL/PLATELET    EKG: None  Radiology: No results found.   Procedures   Medications Ordered in the ED  morphine  (PF) 4 MG/ML injection 4 mg (has no administration in time range)  ondansetron  (ZOFRAN ) injection 4 mg (has no administration in time range)  ketorolac  (TORADOL ) 15 MG/ML injection 15 mg (has no administration in time range)  sodium chloride  0.9 % bolus 1,000 mL (has no administration in time range)                                    Medical Decision Making Amount and/or Complexity of Data Reviewed Labs: ordered. Radiology: ordered.  Risk Prescription drug management.   This patient presents to the ED for concern of abdominal pain and flank pain differential diagnosis includes acute appendicitis, cholecystitis, bowel torsion, diverticulitis, ovarian torsion or cyst, PID, tubo-ovarian abscess, pyelonephritis, kidney stone, pancreatitis, mesenteric ischemia    Additional history obtained:  Additional history obtained from medical records External records from outside source obtained and reviewed including medical records   Lab Tests:  I Ordered, and personally interpreted labs.  The pertinent results include: No leukocytosis, no anemia, normal kidney function liver function, normal electrolytes, negative lipase, urinalysis with large leukocytes,   Imaging Studies ordered:  I ordered imaging studies including CT abdomen and pelvis I independently visualized and interpreted imaging which showed bilateral renal stones with no hydronephrosis, ureteral stone I agree with the radiologist interpretation   Medicines ordered and prescription drug  management:  I ordered medication including morphine , Toradol , Zofran , IV fluids, Rocephin  for urinary tract infection and flank pain Reevaluation of the patient after these medicines showed that the patient improved I have reviewed the patients home medicines and have made adjustments as needed   Problem List / ED Course:  Patient is feeling much better at this time and is stable for discharge home.  Discussed with patient that she has no associated ureteral stones at this point.  She was made aware of the numerous renal stones.  Urinalysis is concerning for urinary tract infection at this point.  Did give the patient a dose of Rocephin  in the emergency department and will continue  with Keflex  on outpatient basis.  Urine has been sent for culture.  Blood work is otherwise been unremarkable and vital signs have been stable.  She has no indication for sepsis at this point.  Do not suspect that admission is warranted.  Did discuss the need for close follow-up with primary care doctor as well as urology.  Strict turn precautions were discussed for any new or worsening symptoms.  Patient voiced understand to the plan and had no additional questions.   Social Determinants of Health:  None        Final diagnoses:  None    ED Discharge Orders     None          [name] 12/11/23 1527    Ruthe Cornet, DO 12/12/23 0700

## 2023-12-11 NOTE — ED Notes (Signed)
 Patient transported to CT

## 2023-12-13 LAB — URINE CULTURE: Culture: >=100000 — AB

## 2023-12-14 ENCOUNTER — Telehealth (HOSPITAL_BASED_OUTPATIENT_CLINIC_OR_DEPARTMENT_OTHER): Payer: Self-pay | Admitting: *Deleted

## 2023-12-14 NOTE — Telephone Encounter (Signed)
 Post ED Visit - Positive Culture Follow-up  Culture report reviewed by antimicrobial stewardship pharmacist: [facility] []  Rankin Dee, Pharm.D. []  Venetia Gully, Pharm.D., BCPS AQ-ID []  Garrel Crews, Pharm.D., BCPS []  Almarie Lunger, Pharm.D., BCPS []  [place], [address].D., BCPS, AAHIVP []  Rosaline Bihari, Pharm.D., BCPS, AAHIVP []  Vernell Meier, PharmD, BCPS []  Latanya Hint, PharmD, BCPS []  Donald Medley, PharmD, BCPS []  Rocky Bold, PharmD []  Dorothyann Alert, PharmD, BCPS [x]  Dorn Buttner, PharmD  [facility] []  Rosaline Edison, PharmD []  Romona Bliss, PharmD []  Dolphus Roller, PharmD []  Veva Seip, Rph []  Vernell Daunt) Leonce, PharmD []  Eva Allis, PharmD []  Rosaline Millet, PharmD []  Iantha Batch, PharmD []  Arvin Gauss, PharmD []  Wanda Hasting, PharmD []  Ronal Rav, PharmD []  Rocky Slade, PharmD []  Bard Jeans, PharmD   Positive urine culture Treated with Cephalexin , organism sensitive to the same and no further patient follow-up is required at this time.  Albino Alan Novak 12/14/2023, 1:01 PM

## 2023-12-16 ENCOUNTER — Encounter: Payer: Self-pay | Admitting: Sports Medicine

## 2023-12-30 ENCOUNTER — Other Ambulatory Visit: Payer: Self-pay

## 2023-12-30 ENCOUNTER — Ambulatory Visit
Admission: RE | Admit: 2023-12-30 | Discharge: 2023-12-30 | Disposition: A | Discharge status: Home / Self Care | Attending: Physician Assistant | Admitting: Physician Assistant

## 2023-12-30 VITALS — BP 126/81 | HR 65 | Temp 98.1°F | Resp 18 | Ht 64.0 in | Wt 170.0 lb

## 2023-12-30 DIAGNOSIS — R102 Pelvic and perineal pain: Secondary | ICD-10-CM | POA: Insufficient documentation

## 2023-12-30 DIAGNOSIS — R350 Frequency of micturition: Secondary | ICD-10-CM | POA: Diagnosis present

## 2023-12-30 DIAGNOSIS — R300 Dysuria: Secondary | ICD-10-CM | POA: Insufficient documentation

## 2023-12-30 LAB — POCT URINE DIPSTICK
Bilirubin, UA: NEGATIVE
Blood, UA: NEGATIVE
Glucose, UA: NEGATIVE mg/dL
Ketones, POC UA: NEGATIVE mg/dL
Leukocytes, UA: NEGATIVE
Nitrite, UA: NEGATIVE
POC PROTEIN,UA: NEGATIVE
Spec Grav, UA: 1.02 (ref 1.010–1.025)
Urobilinogen, UA: 0.2 U/dL
pH, UA: 6 (ref 5.0–8.0)

## 2023-12-30 MED ORDER — PHENAZOPYRIDINE HCL 100 MG PO TABS
100.0000 mg | ORAL_TABLET | Freq: Three times a day (TID) | ORAL | 0 refills | Status: DC | PRN
Start: 2023-12-30 — End: 2024-02-05

## 2023-12-30 MED ORDER — FLUCONAZOLE 150 MG PO TABS: 150.0000 mg | ORAL_TABLET | ORAL | 0 refills | Status: DC | PRN
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
## 2023-12-30 NOTE — Discharge Instructions (Addendum)
 VISIT SUMMARY:  You came in today with symptoms of a urinary tract infection (UTI) and back pain. You have a history of interstitial cystitis and recurrent UTIs. Recently, you completed a course of antibiotics for a UTI caused by E. coli. You also mentioned symptoms that could indicate a yeast infection, likely due to recent antibiotic use.  YOUR PLAN:  -RECURRENT URINARY TRACT INFECTION: A urinary tract infection (UTI) is an infection in any part of your urinary system. Your recent UTI caused by E. coli was treated with Keflex , and your current urinalysis is normal, indicating the infection has resolved. Please monitor your symptoms and stay well-hydrated. If symptoms persist, further evaluation may be needed.  -INTERSTITIAL CYSTITIS: Interstitial cystitis is a chronic condition causing bladder pressure, bladder pain, and sometimes pelvic pain. Your symptoms may be exacerbated by recent stress and the UTI. Please monitor your symptoms and try to manage your stress levels.  -SUSPECTED VULVOVAGINAL CANDIDIASIS: Vulvovaginal candidiasis is a yeast infection that causes itching and burning in the vaginal area. This is likely due to your recent antibiotic use. You have been prescribed Diflucan  to treat the yeast infection, and a swab test will be performed to check for bacterial vaginosis and yeast.  INSTRUCTIONS:  Please monitor your symptoms and stay well-hydrated. If your symptoms persist or worsen, consider further evaluation. Take the prescribed Diflucan  as directed. We will perform a swab test to check for bacterial vaginosis and yeast. Follow up with us  if you have any concerns or if your symptoms do not improve.

## 2023-12-30 NOTE — ED Triage Notes (Addendum)
 Pt presents with complaints of urinary frequency, lower back pain, and bilateral flank pain x 2 days. Currently rates overall pain a 7/10. OTC Tylenol  taken with no improvement/relief in symptoms.

## 2023-12-31 LAB — CERVICOVAGINAL ANCILLARY ONLY
Bacterial Vaginitis (gardnerella): POSITIVE — AB
Candida Glabrata: NEGATIVE
Candida Vaginitis: NEGATIVE
Comment: NEGATIVE
Comment: NEGATIVE
Comment: NEGATIVE

## 2024-01-05 ENCOUNTER — Ambulatory Visit (HOSPITAL_COMMUNITY): Payer: Self-pay

## 2024-01-05 MED ORDER — METRONIDAZOLE 500 MG PO TABS: 500.0000 mg | ORAL_TABLET | Freq: Two times a day (BID) | ORAL | 0 refills | Status: AC

## 2024-02-05 ENCOUNTER — Encounter: Payer: Self-pay | Admitting: Urgent Care

## 2024-02-05 ENCOUNTER — Ambulatory Visit: Admitting: Urgent Care

## 2024-02-05 VITALS — BP 102/70 | HR 76 | Ht 64.0 in | Wt 184.0 lb

## 2024-02-05 DIAGNOSIS — Z23 Encounter for immunization: Secondary | ICD-10-CM

## 2024-02-05 DIAGNOSIS — I4892 Unspecified atrial flutter: Secondary | ICD-10-CM | POA: Diagnosis not present

## 2024-02-05 DIAGNOSIS — E063 Autoimmune thyroiditis: Secondary | ICD-10-CM

## 2024-02-05 DIAGNOSIS — M542 Cervicalgia: Secondary | ICD-10-CM | POA: Diagnosis not present

## 2024-02-05 DIAGNOSIS — G8929 Other chronic pain: Secondary | ICD-10-CM

## 2024-02-05 MED ORDER — METHOCARBAMOL 500 MG PO TABS: 500.0000 mg | ORAL_TABLET | Freq: Three times a day (TID) | ORAL | 7 refills | Status: AC | PRN
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
## 2024-02-05 NOTE — Patient Instructions (Signed)
 We updated your pneumonia and shingles vaccine today.  Remain off levothyroxine  per cardiology We will recheck these levels at your follow up in January. Please come fasting at that time.  I did refill your methocarbamol . Take as needed for muscle/ neck pain. Use with caution as it can make you feel drowsy.

## 2024-05-07 ENCOUNTER — Encounter: Admitting: Urgent Care
